# Patient Record
Sex: Male | Born: 1955 | Race: White | Hispanic: No | Marital: Married | Smoking: Former smoker
Health system: Southern US, Community
[De-identification: ages and names within clinical notes are randomized; demographics above are authoritative.]

## PROBLEM LIST (undated history)

## (undated) DIAGNOSIS — M7740 Metatarsalgia, unspecified foot: Secondary | ICD-10-CM

## (undated) DIAGNOSIS — T8859XA Other complications of anesthesia, initial encounter: Secondary | ICD-10-CM

## (undated) DIAGNOSIS — I739 Peripheral vascular disease, unspecified: Secondary | ICD-10-CM

## (undated) DIAGNOSIS — R350 Frequency of micturition: Secondary | ICD-10-CM

## (undated) DIAGNOSIS — G629 Polyneuropathy, unspecified: Secondary | ICD-10-CM

## (undated) DIAGNOSIS — I2510 Atherosclerotic heart disease of native coronary artery without angina pectoris: Secondary | ICD-10-CM

## (undated) DIAGNOSIS — J302 Other seasonal allergic rhinitis: Secondary | ICD-10-CM

## (undated) DIAGNOSIS — G4700 Insomnia, unspecified: Secondary | ICD-10-CM

## (undated) DIAGNOSIS — K219 Gastro-esophageal reflux disease without esophagitis: Secondary | ICD-10-CM

## (undated) DIAGNOSIS — Z973 Presence of spectacles and contact lenses: Secondary | ICD-10-CM

## (undated) DIAGNOSIS — Z8709 Personal history of other diseases of the respiratory system: Secondary | ICD-10-CM

## (undated) DIAGNOSIS — M1990 Unspecified osteoarthritis, unspecified site: Secondary | ICD-10-CM

## (undated) DIAGNOSIS — E785 Hyperlipidemia, unspecified: Secondary | ICD-10-CM

## (undated) DIAGNOSIS — I10 Essential (primary) hypertension: Secondary | ICD-10-CM

## (undated) DIAGNOSIS — N189 Chronic kidney disease, unspecified: Secondary | ICD-10-CM

## (undated) HISTORY — DX: Metatarsalgia, unspecified foot: M77.40

## (undated) HISTORY — PX: COLONOSCOPY: SHX174

## (undated) HISTORY — DX: Polyneuropathy, unspecified: G62.9

## (undated) HISTORY — DX: Peripheral vascular disease, unspecified: I73.9

## (undated) HISTORY — DX: Unspecified osteoarthritis, unspecified site: M19.90

## (undated) HISTORY — DX: Hyperlipidemia, unspecified: E78.5

## (undated) HISTORY — DX: Essential (primary) hypertension: I10

## (undated) HISTORY — PX: CERVICAL FUSION: SHX112

## (undated) HISTORY — DX: Atherosclerotic heart disease of native coronary artery without angina pectoris: I25.10

---

## 1992-05-30 HISTORY — PX: ELBOW ARTHROPLASTY: SHX928

## 1999-03-16 ENCOUNTER — Encounter: Admission: RE | Admit: 1999-03-16 | Discharge: 1999-06-14 | Payer: Self-pay | Admitting: Specialist

## 2001-12-20 ENCOUNTER — Encounter: Payer: Self-pay | Admitting: Occupational Medicine

## 2001-12-20 ENCOUNTER — Encounter: Admission: RE | Admit: 2001-12-20 | Discharge: 2001-12-20 | Payer: Self-pay | Admitting: Occupational Medicine

## 2002-01-15 ENCOUNTER — Encounter: Admission: RE | Admit: 2002-01-15 | Discharge: 2002-01-15 | Payer: Self-pay | Admitting: Family Medicine

## 2002-01-15 ENCOUNTER — Encounter: Payer: Self-pay | Admitting: Family Medicine

## 2002-05-30 HISTORY — PX: CERVICAL SPINE SURGERY: SHX589

## 2002-10-30 ENCOUNTER — Encounter: Payer: Self-pay | Admitting: Neurosurgery

## 2002-10-30 ENCOUNTER — Encounter: Admission: RE | Admit: 2002-10-30 | Discharge: 2002-10-30 | Payer: Self-pay | Admitting: Neurosurgery

## 2002-11-11 ENCOUNTER — Encounter: Payer: Self-pay | Admitting: Neurosurgery

## 2002-11-11 ENCOUNTER — Inpatient Hospital Stay (HOSPITAL_COMMUNITY): Admission: RE | Admit: 2002-11-11 | Discharge: 2002-11-12 | Payer: Self-pay | Admitting: Neurosurgery

## 2004-02-25 ENCOUNTER — Encounter: Admission: RE | Admit: 2004-02-25 | Discharge: 2004-02-25 | Payer: Self-pay | Admitting: Occupational Medicine

## 2004-05-30 DIAGNOSIS — I2510 Atherosclerotic heart disease of native coronary artery without angina pectoris: Secondary | ICD-10-CM

## 2004-05-30 HISTORY — PX: CORONARY ANGIOPLASTY: SHX604

## 2004-05-30 HISTORY — DX: Atherosclerotic heart disease of native coronary artery without angina pectoris: I25.10

## 2004-05-30 HISTORY — PX: OTHER SURGICAL HISTORY: SHX169

## 2004-10-28 ENCOUNTER — Ambulatory Visit (HOSPITAL_COMMUNITY): Admission: RE | Admit: 2004-10-28 | Discharge: 2004-10-29 | Payer: Self-pay | Admitting: *Deleted

## 2004-10-28 HISTORY — PX: CARDIAC CATHETERIZATION: SHX172

## 2007-05-31 HISTORY — PX: INCISION AND DRAINAGE ABSCESS POSTERIOR CERVICALSPINE: SHX1793

## 2007-07-05 ENCOUNTER — Observation Stay (HOSPITAL_COMMUNITY): Admission: AD | Admit: 2007-07-05 | Discharge: 2007-07-07 | Payer: Self-pay | Admitting: Neurosurgery

## 2008-02-19 ENCOUNTER — Ambulatory Visit (HOSPITAL_COMMUNITY): Admission: RE | Admit: 2008-02-19 | Discharge: 2008-02-20 | Payer: Self-pay | Admitting: Neurosurgery

## 2008-02-27 ENCOUNTER — Inpatient Hospital Stay (HOSPITAL_COMMUNITY): Admission: AD | Admit: 2008-02-27 | Discharge: 2008-02-28 | Payer: Self-pay | Admitting: Neurosurgery

## 2008-03-18 ENCOUNTER — Inpatient Hospital Stay (HOSPITAL_COMMUNITY): Admission: RE | Admit: 2008-03-18 | Discharge: 2008-03-21 | Payer: Self-pay | Admitting: Neurosurgery

## 2010-10-12 NOTE — Op Note (Signed)
NAME:  Derrick [name], Derrick [name] NO.:  [id number]   MEDICAL RECORD NO.:  [id number]          PATIENT TYPE:  INP   LOCATION:  2550                         FACILITY:  [facility]   PHYSICIAN:  Clydene Fake, M.D.  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   PROCEDURE:  Anterior cervical decompression, discectomy, and fusion at  C3-C4 and C4-C5, with Lifenet allograft bone, anterior cervical plate,  and removal of tether plate that was at C5 through C7, and placement of  the Jackson-Pratt drain.   SURGEON:  Clydene Fake, M.D.   ASSISTANT:  Hilda Lias, M.D.   ANESTHESIA:  General endotracheal tube anesthesia.   ESTIMATED BLOOD LOSS:  Minimal.   BLOOD GIVEN:  None.   COMPLICATIONS:  None.   REASON FOR PROCEDURE:  The patient is a 55 year old gentleman who had a  prior ACF at C5-C6 and C6-C7 years ago and has done well but started  getting neck pain, radiating to the shoulders, tingling in his hands,  and noticed some clumsiness and sloppiness. An MRI was done showing  spondylitic changes at C3-C4 and C4-C5 causing canal stenosis, most  significantly at C4-C5. Because of bilateral large disc herniations  along with the spondylitic change and some slight cord change seen in  the MRI, the patient was brought in for decompression and fusion.   PROCEDURE IN DETAIL:  The patient was brought to the operating room and  general anesthesia induced.  The patient was placed in 10 pounds halter  traction and prepped and draped in a sterile fashion.  The site of  incision was injected with 20 mL of lidocaine with epinephrine.  An  incision was  then made at the site of previous scar on the left side of  the neck transversely until the sternocleidomastoid muscle and then  obliquely going cephalad in a hockey stick shaped incision.  The  incision was taken down to the platysma and hemostasis obtained with  Bovie cauterization.  The platysma was incised and blunt dissection  taken through the anterior cervical fascia to anterior cervical spine.  We dissected above the omohyoid muscle and dissected down to the  anterior cervical spine.  I was able to find the top of the previous  plate and the disc spaces above it, C3-C4 and C4-C5. As we exposed  those, needles were placed in the two disc spaces and x-rays obtained  confirming our positioning. As the needles were removed, the C4-C5 space  was incised with a 15 blade and partial discectomy started with  pituitary rongeurs to mark that space.  The longus coli muscles were  reflected laterally on each side.  Attention was then taken to the  plate.  We could dissect down to the middle of the plate and then we  dissected  through the previous scar caudal to the omohyoid muscle and  exposed the bottom part of the plate.  I exposed the screw holes.  There  was actually some bone osteophytes going up around the plates and we  removed those with an osteotome and osteophyte tools.  We then removed  the screws and the superior screws were found to be fractured and the  distal piece was left in place.  After that, the plate was removed and a  good fusion of the previous areas fusion.  Attention was then taken back  to the C3-C4 and C4-C5 spaces.  A self-retaining retractor was placed so  we could see both spaces.  Distraction pins were placed in the C3 and C5  and the interspace was distracted.  Discectomy was then continued with  pituitary rongeurs and curettes and Kerrison punches to remove  osteophytes at both the C3-C4 and C4-C5 level.  Starting at C4-C5, with  microscopic dissection, 1 and 2  mm Kerrison punches were used to remove  posterior disc, posterior ligament, and posterior osteophytes.  Large  fragments of disc were herniated posterior and pushing the spinal cord  and these were decompressed with nerve hooks and Kerrison punches.  Bilateral foraminotomies were performed and when we were finished, we  had good central and lateral decompression and good decompression of the  nerve roots.  At C3-C4, discectomy was continued the same way removing  posterior osteophytes, disc, and posterior ligament, decompressing the  central canal and bilateral foramen.  A high speed drill was used to  remove cartilaginous endplates.  We then measured the disc spaces and  tapped in the corresponding Lifenet allograft bone.  A 4 mm bone was  placed into C3-C4, a 6 mm bone was placed in the C4-C5.  These were  countersunk 2 mm.  We checked posterior to the graft and there was  plenty of room between bone graft and dura.  We irrigated with  antibiotic solution.  The distraction and distraction pins were removed.  Hemostasis was obtained with Gelfoam and thrombin.  An anterior cervical  plate was placed over the anterior cervical spine and two screws placed  into C3, two into C4, and two into C5.  The screws were tightened.  Final x-rays were obtained showing good position of the plate, screws,  and bone plugs.  We irrigated with antibiotic solution, got good  hemostasis at the area of the new decompression, fusion and inferiorly  at the area where we removed the plate.  A JP drain was placed over the  anterior cervical spine and brought out through a separate stab wound  incision.  The platysma was closed with 3-0 Vicryl interrupted sutures,  the subcutaneous tissues closed with the same, the skin closed with  benzoin and Steri-Strips.  The drain was sutured in with 3-0 nylon.  A  dressing was placed.  The patient was placed in a soft cervical collar,  awakened from anesthesia, and  transferred to the recovery room in stable  condition.           ______________________________  Clydene Fake, M.D.     JRH/MEDQ  D:  07/06/2007  T:  07/06/2007  Job:  [id number]

## 2010-10-12 NOTE — Op Note (Signed)
NAME:  SILVIA, MARKUSON NO.:  [id number]   MEDICAL RECORD NO.:  [id number]          PATIENT TYPE:  OIB   LOCATION:  3029                         FACILITY:  [facility]   PHYSICIAN:  Clydene Fake, M.D.  DATE OF BIRTH:  [birth year]   DATE OF PROCEDURE:  03/18/2008  DATE OF DISCHARGE:                               OPERATIVE REPORT   PREOPERATIVE DIAGNOSIS:  Questionable wound infection, posterior  cervical wound incision.   POSTOPERATIVE DIAGNOSIS:  Questionable wound infection, posterior  cervical wound incision.   PROCEDURE:  I&D posterior cervical wound.   SURGEON:  Clydene Fake, MD   ANESTHESIA:  General endotracheal.   ESTIMATED BLOOD LOSS:  20 mL.   BLOOD GIVEN:  None.   DRAINS:  None.   COMPLICATIONS:  None.   PATHOLOGY:  Cultures were sent to labs.  Gram stains were negative.   REASON FOR PROCEDURE:  The patient is a 55 year old gentleman with  nonunion anterior cervical fusion, underwent posterior fusion, with  infuse BMP and allograft bone substitute.  He had copious drainage from  his wound, clear fluid for over 2-1/2 weeks, treating with  Cipro,  dressing changes, fluid has stopped, and he developed more erythema and  induration and some blisters are forming at the incision site.  White  counts remained normal throughout, but possibility of wound infection  was there, and he is having more pain.  Incision was on __________for I  and D of his wound.   PROCEDURE IN DETAIL:  The patient was brought to the operating room and  general anesthesia was induced.  The patient was placed in the prone  position and all pressure points were padded.  The patient was prepped  and draped in sterile fashion and standard previous incision was  incised.  We got a clear yellowish serum-like fluid that was draining  and cultures were sent for superficial area, incision taken down towards  the fascia and the inferior aspect was a defect through the fascia and  through the spinous process at probably C7, and some fluid is coming up  the fascia and did not look purulent, hence the fascia was opened  yesterday and some more serum-like fluid was obtained __________blood.  Deep cultures were then done also.  We debrided the abnormal skin edges  or getting retrograde bleeding normal tissue.  We then Pulsavac'd the  superficial edges along with irrigating the deep root plane.  The  allograft bone substitute to a fragment in copious amount and this was  found in the subcutaneous tissue and this was debrided also.  Initially,  the debridement reviewed good and normal looking tissue.  Fashion was  reclosed with  0 Vicryl interrupted sutures.  Skin closed with 3-0 nylon  and interrupted sutures.  A dressing was placed.  The patient was then  placed back in the supine position, removed from the head pins and  awoken from anesthesia, and transferred to recovery room in stable  condition.           ______________________________  Clydene Fake, M.D.     JRH/MEDQ  D:  03/18/2008  T:  03/19/2008  Job:  [id number]

## 2010-10-12 NOTE — Op Note (Signed)
NAME:  YANIEL, LIMBAUGH NO.:  [id number]   MEDICAL RECORD NO.:  [id number]          PATIENT TYPE:  OIB   LOCATION:  3534                         FACILITY:  [facility]   PHYSICIAN:  Clydene Fake, M.D.  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   POSTOPERATIVE DIAGNOSIS:  Nonunion C3-4 and C4-5 with spondylosis,  cervical spine.   PROCEDURE:  Posterior fusion C3 through C6 (3 levels) with lateral mass  screws and rods from [employer], segmented, autograft same incision,  allograft with Infuse bone morphogenetic protein.   SURGEON:  Clydene Fake, MD   ASSISTANT:  Stefani Dama, MD   ANESTHESIA:  General endotracheal tube anesthesia.   ESTIMATED BLOOD LOSS:  Minimal.   BLOOD GIVEN:  None.   DRAINS:  None.   COMPLICATIONS:  None.   REASON FOR PROCEDURE:  The patient is a 55 year old gentleman with  __________ and has developed a nonunion with fracture - screws, there is  also delayed healing at C3-4.  The patient was brought in for posterior  fusion.   PROCEDURE IN DETAIL:  The patient was brought into the operating room  and general anesthesia was induced.  The patient was placed in [place]  head pins and placed in a prone position with all pressure points  padded.  The patient was prepped and draped in sterile fashion.  A  standard incision was injected with 20 mL of 1% lidocaine with  epinephrine.  Needle was placed in posterior neck.  Fluoroscopy was used  showing we were pointing at the four spinous processes.  Incision was  then made in the midline of cervical spine extending down to fascia.  Hemostasis was obtained with Bovie cauterization.  The fascia was  incised and subperiosteal dissection was done over the C3, C4, C5, and  C6 spinous processes and lamina out to the facets and out over  the  lateral masses.  Self-retaining retractor was placed.  We used high-  speed drill to decorticate facets at C3-4, C4-5, and C5-6, and  decorticate the cortex over the lateral mass and lamina.  We did use  intraoperative fluoroscopy to confirm our positioning.  We then used an  awl to find our entry sites for lateral mass screws on the right side of  C3, C4, C5, and C6.  We then drilled the hole and drilled 40-mm holes at  each of these levels of the angulation up and out through our entry  point, which was 1 mm medial and inferior to the midline of the lateral  mass.  We tapped the holes and then placed 14-mm lateral mass screws.  This process was then repeated on the left side.  We cut rods to size  and bent of a shape, and placed rods into the screw heads, placed  locking nuts, and then we final tightened the nuts on each side, Infuse  it, the autograft bone that we removed from our  drilling, and the  spinous process tips were then placed in the posterolateral space for  posterior fusion.  We placed Infuse BMP and then another allograft  product.  We placed in the posterior space 4 __________ Synthes for  posterior fusion C3 through C6.  The retractors were removed.  We had  good hemostasis.  The fascia was closed with 0 Vicryl interrupted  sutures.  Subcutaneous tissue closed with 0, 2-0, and 3-0 Vicryl  interrupted sutures.  Skin was closed with benzoin and Steri-Strips.  A  dressing was placed.  The patient was placed back into supine position,  removed from head pins, had an Aspen cervical collar placed, awoken from  anesthesia, and transferred to recovery room in stable condition.           ______________________________  Clydene Fake, M.D.     JRH/MEDQ  D:  02/19/2008  T:  02/20/2008  Job:  [id number]

## 2010-10-15 NOTE — Op Note (Signed)
NAME:  Derrick [name], Derrick [name]                        ACCOUNT NO.:  [id number]   MEDICAL RECORD NO.:  [id number]                   PATIENT TYPE:  INP   LOCATION:  NA                                   FACILITY:  [facility]   PHYSICIAN:  Clydene Fake, M.D.               DATE OF BIRTH:  1955-08-19   DATE OF PROCEDURE:  11/11/2002  DATE OF DISCHARGE:                                 OPERATIVE REPORT   PREOPERATIVE DIAGNOSIS:  Spondylosis and herniated nucleus pulposis of C5-6  and C6-7 and left C7 radiculopathy.   POSTOPERATIVE DIAGNOSIS:  Spondylosis and herniated nucleus pulposis of C5-6  and C6-7 and left C7 radiculopathy.   PROCEDURES:  Anterior cervical decompression, diskectomy, and fusion of C5-6  and C6-7 with __________ allograft to bone and anterior cervical plate.   SURGEON:  Clydene Fake, M.D.   ASSISTANT:  Cristi Loron, M.D.   DRAINS:  None.   COMPLICATIONS:  None.   REASON FOR PROCEDURE:  The patient is a 55 year old gentleman who has had  neck and left arm pain and numbness for a period of months.  He was found to  have some left triceps muscle atrophy and he does have decreased sensation  at left C6-7 nerve roots.  MRI was done showing severe spondylosis at C5-6  and C6-7 and bi foraminal narrowing, worse on the left at both levels.  The  patient was brought in for decompression and fusion.   PROCEDURE IN DETAIL:  The patient was brought into the operating room and  general anesthesia was induced.  The patient was placed in 10 pounds of  halter traction and prepped and draped in a sterile fashion.  A second  incision was injected with 10 cc 4% lidocaine with epinephrine.  An incision  was made from the midline to the anterior border of the sternocleidomastoid  muscle on the left side of the neck.  The incision was taken down to the  platysma.  Hemostasis was obtained with electrocauterization.  The platysma  was incised with the Bovie and blunt dissection was  taken through the  anterior cervical fascia to the anterior cervical spine.  A needle was  placed in the interspace.  X-rays were obtained showing this was the C5-6  interspace.  Partial diskectomy was performed as the needle was removed with  pituitary rongeurs.  The longus colli muscles were reflected laterally on  each side between 5, 6, and 7 and a self-retaining retractor was placed.  Distraction pins were placed at C5 and C7 and disk spaces at both C5-6 and  C6-7 were incised with a 15 blade and partial diskectomy was performed and  then interspaces were distracted.  The microscope was brought in for  microdissection using curets, pituitary rongeurs, and Kerns clamps.  After  the disk was removed, posterior longitudinal ligament and disk herniation  and posterior osteophytes were also removed.  __________ in the central  canal and bilateral foraminotomies were performed.  A high-speed drill was  used to remove some of the bone throughout the uncovertebral joints and  removed the cartilaginous endplates.  There was significant foraminal  stenosis bilaterally and we extensively decompressed the roots on both sides  at both C5-6 and C6-7, especially on the left side to decompress these  roots.  When we were finished, we had good decompression of the central  canal and bilateral foramen at both C5-6 and C6-7.  Hemostasis was obtained  with Gelfoam and thrombin.  This was then irrigated out.  We measured the  height of the interbody space using the life__________ bone templates, noted  to be 7 mm, and two 7 mm lengths of bone were tapped into place and  countersunk about a millimeter.  There was plenty of room between the bone  and dura when checked with the nerve hook.  The wound was irrigated with  antibiotic solution.  Distraction was removed.  Weight was removed from  traction.  Distraction pins were removed.  Hemostasis was obtained with  Gelfoam and thrombin.  A __________ anterior  cervical plate was placed over  the anterior cervical spine.  Two screws were placed in C5, one in C6, and  two in C7.  They were tightened.  The retractor was removed.  Hemostasis was  obtained with Gelfoam and thrombin.  Bipolar cauterization.  X-rays were  obtained showing good position of the plate screws interbody thick bone from  C5-6 and C6-7.  The wound was irrigated with antibiotic solution and we were  satisfied with the hemostasis.  The platysma was closed with 3-0 Vicryl  interrupted suture.  The subcutaneous tissue was closed with the same and  the skin was closed with DermaBond skin glue.  When it was dry, a dressing  was placed.  The patient was placed in a soft cervical collar, woke up from  anesthesia, and sent to the recovery room in stable condition.                                               Clydene Fake, M.D.    JRH/MEDQ  D:  11/11/2002  T:  11/11/2002  Job:  [id number]

## 2010-10-15 NOTE — Cardiovascular Report (Signed)
NAME:  JEN, EPPINGER NO.:  [id number]   MEDICAL RECORD NO.:  [id number]          PATIENT TYPE:  OIB   LOCATION:  6529                         FACILITY:  [facility]   PHYSICIAN:  Peter M. Swaziland, M.D.  DATE OF BIRTH:  [birth year]   DATE OF PROCEDURE:  10/28/2004  DATE OF DISCHARGE:                              CARDIAC CATHETERIZATION   INDICATIONS:  A 55 year old white male who presented with chest pain. He had  a stress Cardiolite study which was markedly abnormal showing significant  multivessel ischemia. Subsequent cardiac catheterization demonstrated three-  vessel obstructive coronary disease. He had occlusion of the right coronary  which was supplied by collateral flow. He had a focal 90% stenosis in the  midLAD as well as a focal 95% stenosis in the midcircumflex coronary. It was  felt the patient was a candidate for two-vessel stenting. The patient had a  6-French arterial sheath in place from his diagnostic cardiac  catheterization.   EQUIPMENT USED:  A 6-French 3.5 left Voda, a 0.014 high-torque floppy extra  support wire, a 3.0 x 12 mm Maverick balloon, a 3.0 x 13 mm Cypher stent for  the midcircumflex, a 3.0 x 18 mm Cypher stent for the midLAD and a 3.0 x 13  mm PowerSail balloon.   TOTAL CONTRAST:  Total contrast of the diagnostic and interventional  procedure was 305 cc of Omnipaque. The patient remained hemodynamically  stable throughout its intervention. During balloon inflations in the midLAD,  he did have ST elevation in the precordial leads. He had minimal chest pain.  There were no complications.   PROCEDURE NOTE:  We initially address the lesion in the midcircumflex. This  lesion was crossed without difficulty and predilated using a 3.0 x 12 mm  Maverick balloon to 6 atmospheres. We then placed a 3.0 x 13 mm Cypher drug-  eluting stent across lesion and this was deployed at 11 atmospheres and  postdilated with the stent balloon to 14  atmospheres. This yielded an  excellent angiographic result was 0% residual stenosis.   We then addressed the lesion in the midLAD. This was more of a segmental  stenosis. It also was predilated using a 3.0 x 12 mm Maverick balloon to 6  atmospheres. We then stented this lesion using a 3.0 x 18 mm Cypher stent  deploying at 11 atmospheres and postdilated with the stent balloon to 12  atmospheres. There was still some irregularity in the mid stent, however,  and so we used a 3.0 x 13 millimeter PowerSail balloon to post dilate up to  16 atmospheres. This yielded an excellent angiographic result with 0%  residual stenosis and TIMI grade III flow.   Medications during the procedure included 25 mcg of fentanyl, 1 milligram of  Versed, Integrilin double bolus at 180 mcg/kg followed by continuous  infusion of 2 mcg/kg per minute, nitroglycerin 200 mcg intracoronary x2,  heparin 5000 units IV with subsequent ACT of 256.   FINAL ASSESSMENT:  Successful two-vessel intracoronary stenting of the  midleft anterior descending artery and the midleft circumflex coronary.      PMJ/MEDQ  D:  10/28/2004  T:  10/28/2004  Job:  [id number]   cc:   Caryn Bee L. Little, M.D.  [address]  [place]  [place] [address]  Fax: [phone]   Elmore Guise., M.D.  [address]  [place], [place] [address]  Fax: [phone]

## 2010-10-15 NOTE — Discharge Summary (Signed)
NAME:  Derrick [name], KELNHOFER NO.:  [id number]   MEDICAL RECORD NO.:  [id number]          PATIENT TYPE:  OIB   LOCATION:  6529                         FACILITY:  [facility]   PHYSICIAN:  Elmore Guise., M.D.DATE OF BIRTH:  [birth year]   DATE OF ADMISSION:  10/28/2004  DATE OF DISCHARGE:  10/29/2004                                 DISCHARGE SUMMARY   DISCHARGE DIAGNOSES:  1.  Multivessel coronary artery disease.  2.  Status post successful stent placement in the circumflex and left      anterior descending arteries.  3.  Dyslipidemia.   HISTORY OF PRESENT ILLNESS:  The patient is a 55 year old white male  admitted for elective cardiac catheterization after abnormal stress test.  The patient had cardiac catheterization with stent placement on 10/28/2004.  He had a 90-95% mid circumflex lesion which was focal in nature as well as a  90% mid-LAD lesion which was focal in nature. His RCA was totally occluded  filling via left to right and right to right collaterals. Because of the  focal nature of his lesions, he underwent stent placement which was  successful. Postprocedure, he has done well. He did have an abnormal chest x-  ray done at his primary physician's office. Because of this, he underwent CT  scan on 10/29/2004 to evaluate for any pulmonary nodules and this was  negative. He will be discharged home today to continue the following  medications.   DISCHARGE MEDICATIONS:  1.  Plavix 75 milligrams daily.  2.  Aspirin 325 milligrams daily.  3.  Zetia 10 milligrams daily.  4.  Tricor 145 milligrams daily.  5.  Crestor 20 milligrams daily.  6.  Fish oil.  7.  Calcitriol 0.25 milligrams daily.  8.  Nitroglycerin p.r.n.  9.  Ibuprofen 200 milligrams 2-3 pills every 8 hours as needed for pain.      Pain management is with ibuprofen.   ACTIVITY:  The patient was advised to do no strenuous activity or heavy  lifting for the next 48 hours, then he can resume light  activity. The  patient may return to normal work activities on 11/05/2004.   DISCHARGE DIET:  Diet is low cholesterol, low salt.   WOUND CARE:  Routine post cath/intervention instructions/restrictions.   FOLLOWUP:  Follow up with Dr. Reyes Ivan at [facility] next  Wednesday, 11/03/2004 or Thursday 11/04/2004. The patient is to call the  office at [phone] for an appointment.      TWK/MEDQ  D:  10/29/2004  T:  10/29/2004  Job:  [id number]   cc:   Caryn Bee L. Little, M.D.  [address]  [place]  [place] [address]  Fax: [phone]

## 2010-10-15 NOTE — H&P (Signed)
NAME:  Derrick [name], Derrick [name] NO.:  [id number]   MEDICAL RECORD NO.:  [id number]          PATIENT TYPE:  OIB   LOCATION:                               FACILITY:  [facility]   PHYSICIAN:  Elmore Guise., M.D.DATE OF BIRTH:  [birth year]   DATE OF ADMISSION:  10/28/2004  DATE OF DISCHARGE:                                HISTORY & PHYSICAL   INDICATION FOR ADMISSION:  Abnormal stress test and increasing exertional  chest pain.   HISTORY OF PRESENT ILLNESS:  The patient is a very pleasant 55 year old  white male with past medical history of dyslipidemia, remote history of  tobacco use and family history of coronary disease, who presents for  evaluation of exertional chest pain.  The patient was initially seen on Oct 19, 2004, when he reported that he was having difficulty with chest  tightness when mowing his yard.  He said that tightness would come and go,  it was worse when he was pushing his mower up a hill; however, it did not  seem to bother him when he did other strenuous activities.  He would stop  mowing, go and rest.  His symptoms totally resolved.  However, the last week he could walk up a small hill and sometimes she would  have chest pressure.  Today he returned for exercise Cardiolite and exercised 5 minutes 33 seconds  with 3-4 mm ST depression in his inferior lateral leads.  The patient had  5/10 chest tightness at that time.  The procedure was stopped, his chest  tightness resolved.  EKG changes improved towards baseline.  Nuclear stress  images showed decreased uptake in the inferior, distal and midanterior walls  as well as the anterior septal areas.  He had good uptake in the lateral  wall.  EF was approximately 50%, however mild to moderately dilated.  No  significant wall motion abnormalities were noted.   REVIEW OF SYSTEMS:  Otherwise negative.  He is having no unstable symptoms  at this time.  No rest pain.   His current medications are:  1.   Calcitriol 0.25 mg daily.  2.  Tricor 145 mg daily.  3.  Zetia 10 mg daily.  4.  Fish oil once daily.  5.  Crestor 10 mg daily.  6.  Aspirin 325 mg daily.  7.  Nitroglycerin p.r.n.   ALLERGIES:  To CODEINE.   FAMILY HISTORY:  Positive for coronary disease in his uncles start in their  early 61s.  His father died of lung cancer at age 9.  Mother has Raynaud's  disease.  He has two older brothers, ages 91 and 44, are both healthy.   SOCIAL HISTORY:  He is divorced.  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  He does not exercise, but he  does stay very active with work and work around [employer].  He had a  history of tobacco use; however, he quit 15 years ago and has an occasional  beer.   PHYSICAL EXAMINATION:  VITAL SIGNS:  Weight is 219 pounds, blood pressure is  140/90, heart rate is 60 and regular.  GENERAL:  He is very pleasant middle-aged white male, alert and oriented x4,  in no acute distress.  HEENT:  HEENT Appeared normal.  NECK:  Supple.  No lymphadenopathy, 2+ carotids, no JVD and no bruits.  CHEST:  Lungs were clear.  HEART:  Regular with a normal S1. S2.  No significant murmurs, gallops or  rubs.  ABDOMEN:  Soft, nontender, nondistended.  EXTREMITIES:  Warm with 2+ pulses and no edema.  Femorals are 2+ and equal  bilaterally.  NEUROLOGIC:  He is intact with no focal deficits.   Blood work is pending at time of dictation.  His resting EKG shows normal  sinus rhythm, rate of 60 per minute, normal axis, normal intervals, with RSR  prime noted in V2 and no significant ST or T-wave abnormalities.   IMPRESSION:  1.  Abnormal stress Cardiolite.  2.  Dyslipidemia.  3.  Hypertension.   PLAN:  From a cardiovascular standpoint, I have asked him to take aspirin  and to not perform any strenuous activities until he has invasive workup of  coronary disease.  We will continue his medications as listed above.  We  discussed  cardiac catheterization and possible intervention with him at  length.  This will be scheduled for October 28, 2004, or sooner should he become  more symptomatic.  Routine blood work will be done today including BMP, CBC  and coagulation panel.  Chest x-ray today showed no acute cardiopulmonary  disease.      TWK/MEDQ  D:  10/26/2004  T:  10/26/2004  Job:  [id number]   cc:   Caryn Bee L. Little, M.D.  [address]  [place]  [place] [address]  Fax: [phone]

## 2010-10-15 NOTE — Discharge Summary (Signed)
Derrick [name], Derrick [name] NO.:  [id number]   MEDICAL RECORD NO.:  [id number]          PATIENT TYPE:  INP   LOCATION:  3029                         FACILITY:  [facility]   PHYSICIAN:  Clydene Fake, M.D.  DATE OF BIRTH:  Aug 14, 1955   DATE OF ADMISSION:  03/18/2008  DATE OF DISCHARGE:  03/21/2008                               DISCHARGE SUMMARY   DIAGNOSES:  1. Posterior cervical wound drainage.  2. Questionable wound infection.   DISCHARGE DIAGNOSIS:  Wound drainage, no sign of infection.   PROCEDURE:  Incision and drainage, posterior cervical wound.   REASON FOR ADMISSION:  The patient underwent posterior cervical fusion  few weeks prior to wound drainage that has continued to worsen.  The  patient is brought in for I&D of wound.   HOSPITAL COURSE:  The patient underwent I&D of wound.  No purulent  material was seen.  Candida cultures were sent, Gram stain negative.  Postop, the patient is doing well.  He is on IV vancomycin and p.o.  Cipro.  Cultures have remained negative.  Incision looked good and  healing well.  Culture were negative, 2 days.  On March 21, 2008,  discharged home in stable condition.  Continue p.o. Cipro.  Rest of the  meds same as prehospitalization including Vicodin ES, Effexor, and  Voltaren p.r.n. for his operative pain.  Activity, no strenuous activity  and follow up will be in 10 days for suture removal and wound check.           ______________________________  Clydene Fake, M.D.     JRH/MEDQ  D:  05/01/2008  T:  05/01/2008  Job:  [id number]

## 2010-10-15 NOTE — Cardiovascular Report (Signed)
NAME:  Derrick [name], Derrick [name] NO.:  [id number]   MEDICAL RECORD NO.:  [id number]          PATIENT TYPE:  OIB   LOCATION:  2899                         FACILITY:  [facility]   PHYSICIAN:  Elmore Guise., M.D.DATE OF BIRTH:  [birth year]   DATE OF PROCEDURE:  10/28/2004  DATE OF DISCHARGE:                              CARDIAC CATHETERIZATION   PROCEDURE:  Cardiac catheterization.   INDICATIONS FOR PROCEDURE:  Exertional chest pain and abnormal stress test.   DESCRIPTION OF PROCEDURE:  The patient was brought to the cardiac cath lab  after the appropriate informed consent.  He was prepped and draped in a  sterile fashion.  Approximately 10 cc of 1% lidocaine was used for local  anesthesia.  A 6-French sheath was placed in the right femoral artery  without difficulty.  Coronary angiography, LV angiography, right femoral  angiography and left subclavian and LIMA angiography were then performed  without difficulty.   FINDINGS:  1.  Left main:  Normal size with a distal 20% stenosis.  2.  LAD:  Has proximal 30-40% irregularities, with a focal mid 80-90%      stenosis and distal luminal irregularities.  3.  First diagonal:  Small vessel mild luminal irregularities.  4.  Second diagonal:  Small vessel mild luminal irregularities.  5.  LCX:  Nondominant, large sized vessel with a focal mid 90-95% stenosis      with proximal and distal mild luminal irregularities.  6.  The left system does fill the distal RCA via left-to-right collaterals,      backfilling past the PDA and PLD branches.  7.  RCA:  Proximal 90% stenosis, followed by a 10% long mid occlusion, the      distal vessel fills via right-to-left collaterals.  8.  LVEF is 50-55% with mild inferior apical hypokinesis, EDP is 22 mmHg.   Left subclavian artery: No significant disease with patent and ungrafted  LIMA  Limited right femoral artery:  Mild  atherosclerosis, no dissection and  appropriate arteriotomy site   IMPRESSION:  1.  Obstructive three-vessel coronary artery disease as described with focal      lesions noted in the circumflex and left anterior descending arteries.  2.  Preserved left ventricular systolic function with an ejection fraction      of 50-55% with mild inferior apical hypokinesis and an LVEDP measured at      22 mmHg.   PLAN:  Due to the focal areas of stenosis, we will discuss with Dr. [place]and  elective PCI of the patient's circumflex and LAD lesions with aggressive  medical management and risk factor modification to help prevent further  disease.  Because of the small size of his RCA, surgical grafting may not be  feasible, therefore, since he is an interventional candidate we will try  interventional options first.      TWK/MEDQ  D:  10/28/2004  T:  10/28/2004  Job:  [id number]   cc:   Caryn Bee L. Little, M.D.  [address]  [place]  [place] [address]  Fax: [phone]

## 2010-10-18 ENCOUNTER — Encounter: Payer: Self-pay | Admitting: Family Medicine

## 2010-10-29 ENCOUNTER — Encounter: Payer: Self-pay | Admitting: Cardiovascular Disease

## 2010-10-29 ENCOUNTER — Ambulatory Visit (INDEPENDENT_AMBULATORY_CARE_PROVIDER_SITE_OTHER): Payer: BC Managed Care – PPO | Admitting: Cardiovascular Disease

## 2010-10-29 VITALS — BP 124/64 | HR 64 | Ht 74.0 in | Wt 225.0 lb

## 2010-10-29 DIAGNOSIS — E785 Hyperlipidemia, unspecified: Secondary | ICD-10-CM

## 2010-10-29 DIAGNOSIS — I2510 Atherosclerotic heart disease of native coronary artery without angina pectoris: Secondary | ICD-10-CM

## 2010-10-29 DIAGNOSIS — I25110 Atherosclerotic heart disease of native coronary artery with unstable angina pectoris: Secondary | ICD-10-CM | POA: Insufficient documentation

## 2010-10-29 MED ORDER — NITROGLYCERIN 0.4 MG SL SUBL: 0.4000 mg | SUBLINGUAL_TABLET | SUBLINGUAL | Status: DC | PRN

## 2010-10-29 NOTE — Progress Notes (Signed)
Christoper [name] Date of Birth  12/30/1955 [facility] [address]     [address] [place], [place]  [address] [phone]  Fax  [phone]  History of Present Illness:  55 yo male with history of CAD - s/p stents to LAD and LCx.  Chronically occluded RCA.  No angina.  He eats a very fatty diet - lots of fried foods.  Not exercising.  He does lots of yard work and has never had any episodes of angina.  Current Outpatient Prescriptions  Medication Sig Dispense Refill  . aspirin 325 MG tablet Take 325 mg by mouth daily.        . calcium carbonate (OS-CAL) 600 MG TABS Take 600 mg by mouth 2 (two) times daily with a meal. 3 TABLETS BID       . Cholecalciferol (VITAMIN D) 2000 UNITS CAPS Take 2,400 Units by mouth daily.        Marland Kitchen doxercalciferol (HECTOROL) 2.5 MCG capsule Take 2.5 mcg by mouth daily.       Marland Kitchen ezetimibe (ZETIA) 10 MG tablet Take 10 mg by mouth daily.        . fenofibrate (TRICOR) 145 MG tablet Take 145 mg by mouth daily.        . fish oil-omega-3 fatty acids 1000 MG capsule Take 4,800 mg by mouth daily.       . meloxicam (MOBIC) 15 MG tablet Take 15 mg by mouth daily.        Marland Kitchen olmesartan (BENICAR) 20 MG tablet Take 20 mg by mouth daily.           Allergies  Allergen Reactions  . Codeine     Past Medical History  Diagnosis Date  . Hyperparathyroidism   . Hypertension   . Dyslipidemia   . OA (osteoarthritis)   . Metatarsalgia   . Coronary artery disease   . Hyperlipidemia     Past Surgical History  Procedure Date  . Cervical spine surgery   . Cardiac catheterization 10/28/2004    circ and mid LAD chyper stents    History  Smoking status  . Former Smoker  . Types: Cigarettes  . Quit date: 05/30/1990  Smokeless tobacco  . Not on file    History  Alcohol Use  . Yes    one drink a month    Family History  Problem Relation Age of Onset  . Arthritis Mother   . Lung cancer Father     Reviw of Systems:    Reviewed in the HPI.  All other systems are negative.  Physical Exam: BP 124/64  Pulse 64  Ht 6\' 2"  (1.88 m)  Wt 225 lb (102.059 kg)  BMI 28.89 kg/m2 The patient is alert and oriented x 3.  The mood and affect are normal.  The skin is warm and dry.  Color is normal.  The HEENT exam reveals that the sclera are nonicteric.  The mucous membranes are moist.  The carotids are 2+ without bruits.  There is no thyromegaly.  There is no JVD.  The lungs are clear.  The chest wall is non tender.  The heart exam reveals a regular rate with a normal S1 and S2.  There are no murmurs, gallops, or rubs.  The PMI is not displaced.   Abdominal exam reveals good bowel sounds.  There is no guarding or rebound.  There is no hepatosplenomegaly or tenderness.  There are no masses.  Exam of  the legs reveal no clubbing, cyanosis, or edema.  The legs are without rashes.  The distal pulses are intact.  Cranial nerves II - XII are intact.  Motor and sensory functions are intact.  The gait is normal.  ECG: Sinus bradycardia. There are no ST or T wave changes. Assessment / Plan:

## 2010-10-29 NOTE — Patient Instructions (Signed)
Eat a low fat diet, exercise every day.

## 2010-10-29 NOTE — Assessment & Plan Note (Signed)
Derrick [name] is doing fairly well. I'm glad that he's not had any episodes of chest pain. He is not able to take statins.  I  have encouraged him to eat a low fat and low cholesterol diet.

## 2010-10-29 NOTE — Assessment & Plan Note (Signed)
He will continue the Zetia and TriCor. He is not able to take statins. His triglyceride levels remain elevated. I've encouraged him to cut out his high-fat foods.

## 2011-02-18 LAB — CBC
HCT: 39.5
Hemoglobin: 13.5
MCHC: 34.2
MCV: 88.2
Platelets: 298
RBC: 4.48
RDW: 13
WBC: 7.4

## 2011-02-18 LAB — URINALYSIS, ROUTINE W REFLEX MICROSCOPIC
Bilirubin Urine: NEGATIVE
Glucose, UA: NEGATIVE
Ketones, ur: NEGATIVE
Leukocytes, UA: NEGATIVE
Nitrite: NEGATIVE
Protein, ur: NEGATIVE
Specific Gravity, Urine: 1.015
Urobilinogen, UA: 0.2
pH: 5.5

## 2011-02-18 LAB — URINE MICROSCOPIC-ADD ON

## 2011-02-18 LAB — COMPREHENSIVE METABOLIC PANEL
ALT: 26
AST: 27
Albumin: 4.4
Alkaline Phosphatase: 35 — ABNORMAL LOW
BUN: 12
CO2: 28
Calcium: 8.7
Chloride: 102
Creatinine, Ser: 0.97
GFR calc Af Amer: >60
GFR calc non Af Amer: >60
Glucose, Bld: 95
Potassium: 4.3
Sodium: 140
Total Bilirubin: 0.8
Total Protein: 7.6

## 2011-02-18 LAB — PROTIME-INR
INR: 0.9
Prothrombin Time: 12.5

## 2011-02-18 LAB — APTT: aPTT: 28

## 2011-02-28 LAB — URINALYSIS, ROUTINE W REFLEX MICROSCOPIC
Bilirubin Urine: NEGATIVE
Bilirubin Urine: NEGATIVE
Glucose, UA: NEGATIVE
Glucose, UA: NEGATIVE
Ketones, ur: NEGATIVE
Ketones, ur: NEGATIVE
Leukocytes, UA: NEGATIVE
Leukocytes, UA: NEGATIVE
Nitrite: NEGATIVE
Nitrite: NEGATIVE
Protein, ur: NEGATIVE
Protein, ur: NEGATIVE
Specific Gravity, Urine: 1.016
Specific Gravity, Urine: 1.014
Urobilinogen, UA: 0.2
Urobilinogen, UA: 0.2
pH: 7
pH: 7

## 2011-02-28 LAB — CBC
HCT: 37 — ABNORMAL LOW
HCT: 40.2
HCT: 38.5 — ABNORMAL LOW
Hemoglobin: 12.4 — ABNORMAL LOW
Hemoglobin: 13.7
Hemoglobin: 12.6 — ABNORMAL LOW
MCHC: 33.4
MCHC: 34
MCHC: 32.8
MCV: 88.3
MCV: 88.5
MCV: 88.3
Platelets: 273
Platelets: 357
Platelets: 386
RBC: 4.18 — ABNORMAL LOW
RBC: 4.55
RBC: 4.36
RDW: 12.9
RDW: 13.3
RDW: 13.3
WBC: 7.7
WBC: 8.8
WBC: 7.5

## 2011-02-28 LAB — DIFFERENTIAL
Basophils Absolute: 0
Basophils Absolute: 0
Basophils Relative: 0
Basophils Relative: 1
Eosinophils Absolute: 0.2
Eosinophils Absolute: 0.1
Eosinophils Relative: 2
Eosinophils Relative: 1
Lymphocytes Relative: 16
Lymphocytes Relative: 19
Lymphs Abs: 1.4
Lymphs Abs: 1.4
Monocytes Absolute: 1.3 — ABNORMAL HIGH
Monocytes Absolute: 0.7
Monocytes Relative: 14 — ABNORMAL HIGH
Monocytes Relative: 9
Neutro Abs: 5.9
Neutro Abs: 5.4
Neutrophils Relative %: 67
Neutrophils Relative %: 71

## 2011-02-28 LAB — CULTURE, BLOOD (ROUTINE X 2)
Culture: NO GROWTH
Culture: NO GROWTH

## 2011-02-28 LAB — URINE MICROSCOPIC-ADD ON

## 2011-02-28 LAB — HEPATIC FUNCTION PANEL
ALT: 22
AST: 27
Albumin: 4.3
Alkaline Phosphatase: 32 — ABNORMAL LOW
Bilirubin, Direct: 0.2
Indirect Bilirubin: 0.5
Total Bilirubin: 0.7
Total Protein: 7.2

## 2011-02-28 LAB — PROTIME-INR
INR: 1
INR: 1
INR: 1
Prothrombin Time: 13
Prothrombin Time: 13.2
Prothrombin Time: 13

## 2011-02-28 LAB — GRAM STAIN

## 2011-02-28 LAB — ANAEROBIC CULTURE

## 2011-02-28 LAB — BASIC METABOLIC PANEL
BUN: 15
BUN: 10
CO2: 25
CO2: 25
Calcium: 8.8
Calcium: 9.1
Chloride: 106
Chloride: 102
Creatinine, Ser: 1
Creatinine, Ser: 1.03
GFR calc Af Amer: >60
GFR calc Af Amer: >60
GFR calc non Af Amer: >60
GFR calc non Af Amer: >60
Glucose, Bld: 90
Glucose, Bld: 90
Potassium: 4.2
Potassium: 3.7
Sodium: 139
Sodium: 138

## 2011-02-28 LAB — WOUND CULTURE
Culture: NO GROWTH
Culture: NO GROWTH

## 2011-02-28 LAB — APTT
aPTT: 28
aPTT: 38 — ABNORMAL HIGH
aPTT: 29

## 2011-02-28 LAB — C-REACTIVE PROTEIN: CRP: 6.6 — ABNORMAL HIGH (ref <0.6)

## 2011-02-28 LAB — SEDIMENTATION RATE: Sed Rate: 88 — ABNORMAL HIGH

## 2013-03-04 ENCOUNTER — Other Ambulatory Visit: Payer: Self-pay | Admitting: Orthopedic Surgery

## 2013-03-04 DIAGNOSIS — M25511 Pain in right shoulder: Secondary | ICD-10-CM

## 2013-03-11 ENCOUNTER — Ambulatory Visit
Admission: RE | Admit: 2013-03-11 | Discharge: 2013-03-11 | Disposition: A | Discharge status: Home / Self Care | Payer: BC Managed Care – PPO | Source: Ambulatory Visit | Attending: Orthopedic Surgery | Admitting: Orthopedic Surgery

## 2013-03-11 DIAGNOSIS — M25511 Pain in right shoulder: Secondary | ICD-10-CM

## 2013-03-19 ENCOUNTER — Other Ambulatory Visit: Payer: Self-pay | Admitting: Orthopedic Surgery

## 2013-03-22 ENCOUNTER — Encounter (HOSPITAL_BASED_OUTPATIENT_CLINIC_OR_DEPARTMENT_OTHER): Payer: Self-pay | Admitting: *Deleted

## 2013-03-22 NOTE — Progress Notes (Signed)
Reviewed with dr crews-does need to see cardiology prior to surgery-[facility] called-has ov with Lawson Fiscal 03/27/13 at 8am-arrive 745am=-pt called

## 2013-03-22 NOTE — Progress Notes (Signed)
Pt had stents x2 2006-followed dr Melburn Popper until 6/12-has not seen cardiology since-no recent ekg-has occ palpitations-needs ekg and bmet-and will ck with anesthesia-may need to see cardiology. Bring all meds and overnight bag dos

## 2013-03-26 ENCOUNTER — Other Ambulatory Visit: Payer: Self-pay | Admitting: Orthopedic Surgery

## 2013-03-27 ENCOUNTER — Encounter: Payer: Self-pay | Admitting: Nurse Practitioner

## 2013-03-27 ENCOUNTER — Ambulatory Visit (INDEPENDENT_AMBULATORY_CARE_PROVIDER_SITE_OTHER): Payer: BC Managed Care – PPO | Admitting: Nurse Practitioner

## 2013-03-27 ENCOUNTER — Telehealth: Payer: Self-pay | Admitting: *Deleted

## 2013-03-27 VITALS — BP 148/80 | HR 67 | Ht 74.0 in | Wt 208.4 lb

## 2013-03-27 DIAGNOSIS — Z01818 Encounter for other preprocedural examination: Secondary | ICD-10-CM

## 2013-03-27 DIAGNOSIS — I259 Chronic ischemic heart disease, unspecified: Secondary | ICD-10-CM

## 2013-03-27 NOTE — Telephone Encounter (Signed)
S/w with Tammy at Dr. Maryjean Ka office @ [phone] to let office know saw pt in office am and did not get cleared for rotary cuff surgery pt was having tomorrow pt has to get stress test nuclear, pt was concerned about pre-op labs I related message to Dr. Stark Jock office and assured labs would be cancelled. Tammy stated will I be faxing ov note over I stated Lawson Fiscal already routed Dr. Teressa Senter her note.

## 2013-03-27 NOTE — Progress Notes (Signed)
Derrick [name] Date of Birth: May 23, 1956 Medical Record #[id number]  History of Present Illness: Derrick [name] is seen back today for a pre op clearance visit. Seen for Dr. Elease Hashimoto. Former patient of Dr. Silva Bandy. Has not been seen here since June of 2012. He has known CAD with prior stents to the LAD and LCX in 2006. He has a chronically occluded RCA. His other issues include HLD and he has been statin intolerant.   Last seen in June of 2012 - was doing ok but not exercising or not eating right. No chest pain reported.   Comes in today. Here alone. Planning on right rotator cuff surgery per Dr. Teressa Senter for tomorrow.  He has been having some chest tightness and a burning sensation at times in his chest. He does not exercise. Not really short of breath. Not dizzy or lightheaded. Has had no follow up stress testing since his PCI 8 years ago. No longer on statin - his PCP checks his labs. Creatinine has increased and he has been taken off of his Mobic which has caused more joint pain. BP above goal. Losartan just increased.   Current Outpatient Prescriptions  Medication Sig Dispense Refill  . aspirin 325 MG tablet Take 325 mg by mouth daily.        . calcitRIOL (ROCALTROL) 0.5 MCG capsule Take 0.5 mcg by mouth daily.      . calcium-vitamin D (OSCAL WITH D) 500-200 MG-UNIT per tablet Take 2 tablets by mouth 2 (two) times daily.      Marland Kitchen ezetimibe (ZETIA) 10 MG tablet Take 10 mg by mouth daily.        . fenofibrate 160 MG tablet Take 160 mg by mouth daily.      . fexofenadine ([name]) 180 MG tablet Take 180 mg by mouth daily.      . fish oil-omega-3 fatty acids 1000 MG capsule Take 4,800 mg by mouth daily.       Marland Kitchen glucosamine-chondroitin 500-400 MG tablet Take 1 tablet by mouth 3 (three) times daily.      Marland Kitchen losartan (COZAAR) 50 MG tablet Take 100 mg by mouth daily.       . ranitidine (ZANTAC) 150 MG tablet Take 150 mg by mouth 2 (two) times daily.      . traZODone (DESYREL) 50 MG tablet Take 50 mg by  mouth at bedtime.      . nitroGLYCERIN (NITROSTAT) 0.4 MG SL tablet Place 1 tablet (0.4 mg total) under the tongue every 5 (five) minutes as needed for chest pain.  25 tablet  12   No current facility-administered medications for this visit.    Allergies  Allergen Reactions  . Atorvastatin     Muscle aches  . Codeine Itching  . Crestor [Rosuvastatin Calcium]     Muscle aches    Past Medical History  Diagnosis Date  . Hyperparathyroidism   . Hypertension   . Dyslipidemia   . OA (osteoarthritis)   . Metatarsalgia   . Hyperlipidemia     statin intolerant  . GERD (gastroesophageal reflux disease)   . Coronary artery disease 2006    stents x2=LAD LCX; totally occluded RCA  . Seasonal allergies   . Insomnia     Past Surgical History  Procedure Laterality Date  . Cervical spine surgery  2004  . Cardiac catheterization  10/28/2004    circ and mid LAD chyper stents  . Cervical fusion  [id number]  . Incision and drainage abscess posterior cervicalspine  2009  . Elbow arthroplasty  1994    right  . Colonoscopy      History  Smoking status  . Former Smoker  . Types: Cigarettes  . Quit date: 05/30/1990  Smokeless tobacco  . Not on file    History  Alcohol Use  . Yes    Comment: one drink a month    Family History  Problem Relation Age of Onset  . Arthritis Mother   . Lung cancer Father     Review of Systems: The review of systems is per the HPI.  All other systems were reviewed and are negative.  Physical Exam: BP 148/80  Pulse 67  Ht 6\' 2"  (1.88 m)  Wt 208 lb 6.4 oz (94.53 kg)  BMI 26.75 kg/m2 Patient is very pleasant and in no acute distress. Skin is warm and dry. Color is normal.  HEENT is unremarkable. Normocephalic/atraumatic. PERRL. Sclera are nonicteric. Neck is supple. No masses. No JVD. Lungs are clear. Cardiac exam shows a regular rate and rhythm. Abdomen is soft. Extremities are without edema. Gait and ROM are intact. No gross neurologic deficits  noted.  LABORATORY DATA: EKG today shows sinus rhythm. He has nonspecific ST/T wave changes.   Lab Results  Component Value Date   WBC 7.5 03/18/2008   HGB 12.6* 03/18/2008   HCT 38.5* 03/18/2008   PLT 386 03/18/2008   GLUCOSE 90 03/18/2008   ALT 22 02/15/2008   AST 27 02/15/2008   NA 138 03/18/2008   K 3.7 03/18/2008   CL 102 03/18/2008   CREATININE 1.03 03/18/2008   BUN 10 03/18/2008   CO2 25 03/18/2008   INR 1.0 03/18/2008   DATE OF PROCEDURE: 10/28/2004   CARDIAC CATHETERIZATION  INDICATIONS: A 57 year old white male who presented with chest pain. He had  a stress Cardiolite study which was markedly abnormal showing significant  multivessel ischemia. Subsequent cardiac catheterization demonstrated three-  vessel obstructive coronary disease. He had occlusion of the right coronary  which was supplied by collateral flow. He had a focal 90% stenosis in the  midLAD as well as a focal 95% stenosis in the midcircumflex coronary. It was  felt the patient was a candidate for two-vessel stenting. The patient had a  6-French arterial sheath in place from his diagnostic cardiac  catheterization.  EQUIPMENT USED: A 6-French 3.5 left Voda, a 0.014 high-torque floppy extra  support wire, a 3.0 x 12 mm Maverick balloon, a 3.0 x 13 mm Cypher stent for  the midcircumflex, a 3.0 x 18 mm Cypher stent for the midLAD and a 3.0 x 13  mm PowerSail balloon.  TOTAL CONTRAST: Total contrast of the diagnostic and interventional  procedure was 305 cc of Omnipaque. The patient remained hemodynamically  stable throughout its intervention. During balloon inflations in the midLAD,  he did have ST elevation in the precordial leads. He had minimal chest pain.  There were no complications.  PROCEDURE NOTE: We initially address the lesion in the midcircumflex. This  lesion was crossed without difficulty and predilated using a 3.0 x 12 mm  Maverick balloon to 6 atmospheres. We then placed a 3.0 x 13 mm  Cypher drug-  eluting stent across lesion and this was deployed at 11 atmospheres and  postdilated with the stent balloon to 14 atmospheres. This yielded an  excellent angiographic result was 0% residual stenosis.  We then addressed the lesion in the midLAD. This was more of a segmental  stenosis. It also was predilated using  a 3.0 x 12 mm Maverick balloon to 6  atmospheres. We then stented this lesion using a 3.0 x 18 mm Cypher stent  deploying at 11 atmospheres and postdilated with the stent balloon to 12  atmospheres. There was still some irregularity in the mid stent, however,  and so we used a 3.0 x 13 millimeter PowerSail balloon to post dilate up to  16 atmospheres. This yielded an excellent angiographic result with 0%  residual stenosis and TIMI grade III flow.  Medications during the procedure included 25 mcg of fentanyl, 1 milligram of  Versed, Integrilin double bolus at 180 mcg/kg followed by continuous  infusion of 2 mcg/kg per minute, nitroglycerin 200 mcg intracoronary x2,  heparin 5000 units IV with subsequent ACT of 256.  FINAL ASSESSMENT: Successful two-vessel intracoronary stenting of the  midleft anterior descending artery and the midleft circumflex coronary.  PMJ/MEDQ D: 10/28/2004 T: 10/28/2004 Job: [id number]    Assessment / Plan: 1. CAD with remote PCI to the mid LAD and mid LCX - has occluded RCA chronically - does endorse chest tightness over the past several weeks - will need stress testing before further disposition can be made.   2. Pre op clearance - I am not willing to clear him at this time. His surgery will need to be postponed.   3. HLD - statin intolerant - labs are checked by his PCP - Derrick Hazel, Derrick [name]  Patient is agreeable to this plan and will call if any problems develop in the interim.   Derrick Macadamia, RN, ANP-C [facility] [address] [place], [place]  [address]

## 2013-03-27 NOTE — Patient Instructions (Addendum)
Continue with your current medicines  We will arrange for a stress test (stress Myoview)  Your surgery will need to be postponed  Call the [facility] office at [phone] if you have any questions, problems or concerns.

## 2013-03-28 ENCOUNTER — Ambulatory Visit (HOSPITAL_BASED_OUTPATIENT_CLINIC_OR_DEPARTMENT_OTHER)
Admission: RE | Admit: 2013-03-28 | Payer: BC Managed Care – PPO | Source: Ambulatory Visit | Admitting: Orthopedic Surgery

## 2013-03-28 HISTORY — DX: Other seasonal allergic rhinitis: J30.2

## 2013-03-28 HISTORY — DX: Insomnia, unspecified: G47.00

## 2013-03-28 HISTORY — DX: Gastro-esophageal reflux disease without esophagitis: K21.9

## 2013-03-28 SURGERY — REPAIR, ROTATOR CUFF, OPEN
Anesthesia: General | Laterality: Right

## 2013-04-01 ENCOUNTER — Ambulatory Visit (HOSPITAL_COMMUNITY): Payer: BC Managed Care – PPO | Attending: Cardiology | Admitting: Radiology

## 2013-04-01 VITALS — BP 138/72 | HR 68 | Ht 74.0 in | Wt 204.0 lb

## 2013-04-01 DIAGNOSIS — R079 Chest pain, unspecified: Secondary | ICD-10-CM

## 2013-04-01 DIAGNOSIS — E785 Hyperlipidemia, unspecified: Secondary | ICD-10-CM | POA: Insufficient documentation

## 2013-04-01 DIAGNOSIS — R0789 Other chest pain: Secondary | ICD-10-CM | POA: Insufficient documentation

## 2013-04-01 DIAGNOSIS — I2510 Atherosclerotic heart disease of native coronary artery without angina pectoris: Secondary | ICD-10-CM

## 2013-04-01 DIAGNOSIS — I10 Essential (primary) hypertension: Secondary | ICD-10-CM | POA: Insufficient documentation

## 2013-04-01 DIAGNOSIS — Z01810 Encounter for preprocedural cardiovascular examination: Secondary | ICD-10-CM | POA: Insufficient documentation

## 2013-04-01 DIAGNOSIS — Z87891 Personal history of nicotine dependence: Secondary | ICD-10-CM | POA: Insufficient documentation

## 2013-04-01 DIAGNOSIS — I259 Chronic ischemic heart disease, unspecified: Secondary | ICD-10-CM

## 2013-04-01 DIAGNOSIS — Z01818 Encounter for other preprocedural examination: Secondary | ICD-10-CM

## 2013-04-01 MED ORDER — TECHNETIUM TC 99M SESTAMIBI GENERIC - CARDIOLITE
30.0000 | Freq: Once | INTRAVENOUS | Status: AC | PRN
  Administered 2013-04-01: 30 via INTRAVENOUS

## 2013-04-01 MED ORDER — REGADENOSON 0.4 MG/5ML IV SOLN
0.4000 mg | Freq: Once | INTRAVENOUS | Status: AC
  Administered 2013-04-01: 0.4 mg via INTRAVENOUS

## 2013-04-01 MED ORDER — TECHNETIUM TC 99M SESTAMIBI GENERIC - CARDIOLITE
10.0000 | Freq: Once | INTRAVENOUS | Status: AC | PRN
  Administered 2013-04-01: 10 via INTRAVENOUS

## 2013-04-01 NOTE — Progress Notes (Signed)
[facility] SITE 3 NUCLEAR MED [address] [place], [place] [address] [phone]    Cardiology Nuclear Med Study  MARQUES ERICSON is a 57 y.o. male     MRN : [id number]     DOB: 09/07/1955  Procedure Date: 04/01/2013  Nuclear Med Background Indication for Stress Test:  Evaluation for Ischemia and Surgical Clearance for Pending Rotator Cuff Repair with Dr. Josephine Igo History:  Stents; '06 [id number] (no report) Cardiac Risk Factors: History of Smoking, Hypertension and Lipids  Symptoms:  Chest Tightness (last date of chest discomfort was on 03/28/13) and Rapid HR   Nuclear Pre-Procedure Caffeine/Decaff Intake:  None NPO After: 8:00pm   Lungs:  Clear. O2 Sat: 98% on room air. IV 0.9% NS with Angio Cath:  22g  IV Site: R Hand  IV Started by:  Cathlyn Parsons, RN  Chest Size (in):  46 Cup Size: n/a  Height: 6\' 2"  (1.88 m)  Weight:  204 lb (92.534 kg)  BMI:  Body mass index is 26.18 kg/(m^2). Tech Comments:  n/a    Nuclear Med Study 1 or 2 day study: 1 day  Stress Test Type:  Treadmill/Lexiscan  Reading MD: Olga Millers, MD  Order Authorizing Provider:  Lucina Mellow, MD and Norma Fredrickson, NP  Resting Radionuclide: Technetium 42m Sestamibi  Resting Radionuclide Dose: 11.0 mCi   Stress Radionuclide:  Technetium 60m Sestamibi  Stress Radionuclide Dose: 32.9 mCi           Stress Protocol Rest HR: 68 Stress HR: 134  Rest BP: 138/72 Stress BP: 163/65  Exercise Time (min): 2:00 METS: n/a   Predicted Max HR: 161 bpm % Max HR: 83.23 bpm Rate Pressure Product: 96295   Dose of Adenosine (mg):  n/a Dose of Lexiscan: 0.4 mg  Dose of Atropine (mg): n/a Dose of Dobutamine: n/a mcg/kg/min (at max HR)  Stress Test Technologist: Smiley Houseman, CMA-N  Nuclear Technologist:  Dario Guardian, CNMT     Rest Procedure:  Myocardial perfusion imaging was performed at rest 45 minutes following the intravenous administration of Technetium 69m Sestamibi.  Rest ECG: NSR -  Normal EKG  Stress Procedure:  The patient received IV Lexiscan 0.4 mg over 15-seconds with concurrent low level exercise and then Technetium 75m Sestamibi was injected at 30-seconds while the patient continued walking one more minute. He c/o shortness of breath with Lexiscan.   Quantitative spect images were obtained after a 45-minute delay.  Stress ECG: No significant ST segment change suggestive of ischemia.  QPS Raw Data Images:  Acquisition technically good; normal left ventricular size. Stress Images:  There is decreased uptake in the inferior wall. Rest Images:  There is decreased uptake in the inferior wall, slightly less prominent compared to the stress images Subtraction (SDS):  These findings are consistent with prior inferior infarct and very mild peri-infarct ischemia. Transient Ischemic Dilatation (Normal <1.22):  0.93 Lung/Heart Ratio (Normal <0.45):  0.32  Quantitative Gated Spect Images QGS EDV:  123 ml QGS ESV:  57 ml  Impression Exercise Capacity:  Lexiscan with low level exercise. BP Response:  Normal blood pressure response. Clinical Symptoms:  There is dyspnea. ECG Impression:  No significant ST segment change suggestive of ischemia. Comparison with Prior Nuclear Study: No images to compare  Overall Impression:  Low risk stress nuclear study with a large, severe intensity, partially reversible inferior defect consistent with prior inferior infarct and very mild peri-infarct ischemia.  LV Ejection Fraction: 54%.  LV Wall Motion:  Inferior  hypokinesis.   Olga Millers

## 2013-04-04 ENCOUNTER — Telehealth: Payer: Self-pay | Admitting: Cardiovascular Disease

## 2013-04-04 NOTE — Telephone Encounter (Signed)
Dr. Teressa Senter was sent a copy of stress test that gives clearance for shoulder surgery. Pt aware.

## 2013-04-04 NOTE — Telephone Encounter (Signed)
Follow Up  Pt states he received a missed call after the results were explained to him// He requests a call back if necessary

## 2013-04-04 NOTE — Telephone Encounter (Signed)
Results were given/ no return call needed

## 2013-04-04 NOTE — Telephone Encounter (Signed)
New Problem ° °Pt calling for results.  °

## 2013-04-09 ENCOUNTER — Telehealth: Payer: Self-pay | Admitting: Cardiovascular Disease

## 2013-04-09 NOTE — Telephone Encounter (Signed)
New Problem  Pt calling for cardiac clearance the Shoulder Procedure at [facility]// please call back

## 2013-04-09 NOTE — Telephone Encounter (Signed)
Faxed study to [phone] per patient's request.

## 2013-04-09 NOTE — Telephone Encounter (Signed)
Patient aware that the cardiac clearance was already sent to Dr.Sypher today.

## 2013-04-09 NOTE — Telephone Encounter (Signed)
New message     New fax # [phone] to fax clearance for surgery for Dr Teressa Senter.  They never got other clearance that was faxed.

## 2013-04-09 NOTE — Telephone Encounter (Signed)
msg left at number provided, cardiac clearance was sent to Dr Teressa Senter. Call with further questions.

## 2013-04-09 NOTE — Telephone Encounter (Signed)
New Problem:  Pt states he is calling reference to surgical clearance. Pt would like his clearance faxed to... Fax  # [phone] Dr. Teressa Senter

## 2013-04-12 ENCOUNTER — Other Ambulatory Visit: Payer: Self-pay | Admitting: Orthopedic Surgery

## 2013-04-18 ENCOUNTER — Encounter (HOSPITAL_BASED_OUTPATIENT_CLINIC_OR_DEPARTMENT_OTHER): Payer: Self-pay | Admitting: *Deleted

## 2013-04-18 NOTE — Progress Notes (Signed)
Cleared for surgery-will come in for bmet-bring all meds and overnight bag

## 2013-04-19 ENCOUNTER — Encounter (HOSPITAL_BASED_OUTPATIENT_CLINIC_OR_DEPARTMENT_OTHER)
Admission: RE | Admit: 2013-04-19 | Discharge: 2013-04-19 | Disposition: A | Discharge status: Home / Self Care | Payer: BC Managed Care – PPO | Source: Ambulatory Visit | Attending: Orthopedic Surgery | Admitting: Orthopedic Surgery

## 2013-04-19 DIAGNOSIS — Z01818 Encounter for other preprocedural examination: Secondary | ICD-10-CM | POA: Insufficient documentation

## 2013-04-19 DIAGNOSIS — Z01812 Encounter for preprocedural laboratory examination: Secondary | ICD-10-CM | POA: Insufficient documentation

## 2013-04-19 LAB — BASIC METABOLIC PANEL
BUN: 22 mg/dL (ref 6–23)
CO2: 26 mEq/L (ref 19–32)
Calcium: 9.7 mg/dL (ref 8.4–10.5)
Chloride: 100 mEq/L (ref 96–112)
Creatinine, Ser: 1.44 mg/dL — ABNORMAL HIGH (ref 0.50–1.35)
GFR calc Af Amer: 60 mL/min — ABNORMAL LOW (ref >90)
GFR calc non Af Amer: 52 mL/min — ABNORMAL LOW (ref >90)
Glucose, Bld: 100 mg/dL — ABNORMAL HIGH (ref 70–99)
Potassium: 4.2 mEq/L (ref 3.5–5.1)
Sodium: 138 mEq/L (ref 135–145)

## 2013-04-22 NOTE — H&P (Signed)
Derrick [name] is an 57 y.o. male.   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  He has had pain in the region of his right shoulder since February, 2014.  He noted his discomfort after lifting boxes repetitively at work weighing 1 to 72 pounds.  He has been a 40 year employee of [employer].  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  He has had progressive weakness of abduction, scaption and flexion, crepitation with motion , difficulty sleeping on the right shoulder at night.  He has no symptoms on the left side.  He recently tore the long head of his biceps. He now seeks an upper extremity orthopaedic consult.     Past Medical History  Diagnosis Date  . Hyperparathyroidism   . Hypertension   . Dyslipidemia   . OA (osteoarthritis)   . Metatarsalgia   . Hyperlipidemia     statin intolerant  . GERD (gastroesophageal reflux disease)   . Coronary artery disease 2006    stents x2=LAD LCX; totally occluded RCA  . Seasonal allergies   . Insomnia     Past Surgical History  Procedure Laterality Date  . Cervical spine surgery  2004  . Cardiac catheterization  10/28/2004    circ and mid LAD chyper stents  . Cervical fusion  [id number]  . Incision and drainage abscess posterior cervicalspine  2009  . Elbow arthroplasty  1994    right  . Colonoscopy      Family History  Problem Relation Age of Onset  . Arthritis Mother   . Lung cancer Father    Social History:  reports that he quit smoking about 22 years ago. His smoking use included Cigarettes. He smoked 0.00 packs per day. He does not have any smokeless tobacco history on file. He reports that he drinks alcohol. He reports that he does not use illicit drugs.  Allergies:  Allergies  Allergen Reactions  . Atorvastatin     Muscle aches  .  Codeine Itching  . Crestor [Rosuvastatin Calcium]     Muscle aches    No prescriptions prior to admission    No results found for this or any previous visit (from the past 48 hour(s)).  No results found.   Pertinent items are noted in HPI.  Height 6\' 2"  (1.88 m), weight 92.534 kg (204 lb).  General appearance: alert Head: Normocephalic, without obvious abnormality Neck: supple, symmetrical, trachea midline Resp: clear to auscultation bilaterally Cardio: regular rate and rhythm GI: normal findings: bowel sounds normal Extremities:   Inspection of his shoulders reveals change in the contour of his right long head of the biceps with the typical distal prominence. His shoulder range of motion reveals combined elevation right shoulder/170, left shoulder/175, external rotation 90 degrees abduction 90/right, 95/left.  Internal rotation right shoulder/T-10, left shoulder T-10.  He can extend to the interscapular plane bilaterally.  He does not show signs of adhesive capsulitis.  Strength testing reveals weakness of abduction, scaption, forward flexion.  He has a painful cross-torso adduction and Hawkins maneuver.  He has a painful push-off test.  Palpation of his greater tuberosity reveals a bony prominent consistent with bone growth following rotator cuff tear.    X-rays of his right shoulder AP internal and external rotation views and  an outlet view demonstrate advanced arthritis of the acromioclavicular joint, an osteophyte at the inferior glenoid, new bone growth of the greater tuberosity consistent with rotator cuff tear.     He had a MRI at [facility] on 03/11/13.  He has a full thickness retracted rotator cuff involving the supraspinatus.  He has unfavorable AC anatomy.    Pulses: 2+ and symmetric Skin: normal Neurologic: Grossly normal    Assessment/Plan Impression: Right shoulder impingement with A/C arthrosis and RC tear  Plan: To the OR for right shoulder scope with  SAD/DCR and RC repair as needed.The procedure, risks,benefits and post-op course were discussed with the patient at length and they were in agreement with the plan.   DASNOIT,Sani Madariaga J 04/22/2013, 3:08 PM   H&P documentation: 04/23/2013  -History and Physical Reviewed  -Patient has been re-examined  -No change in the plan of care  Wyn Forster, MD

## 2013-04-23 ENCOUNTER — Encounter (HOSPITAL_BASED_OUTPATIENT_CLINIC_OR_DEPARTMENT_OTHER): Payer: Self-pay | Admitting: Orthopedic Surgery

## 2013-04-23 ENCOUNTER — Encounter (HOSPITAL_BASED_OUTPATIENT_CLINIC_OR_DEPARTMENT_OTHER)
Admission: RE | Disposition: A | Discharge status: Home / Self Care | Payer: Self-pay | Source: Ambulatory Visit | Attending: Orthopedic Surgery

## 2013-04-23 ENCOUNTER — Ambulatory Visit (HOSPITAL_BASED_OUTPATIENT_CLINIC_OR_DEPARTMENT_OTHER): Payer: BC Managed Care – PPO | Admitting: Certified Registered Nurse Anesthetist

## 2013-04-23 ENCOUNTER — Ambulatory Visit (HOSPITAL_BASED_OUTPATIENT_CLINIC_OR_DEPARTMENT_OTHER)
Admission: RE | Admit: 2013-04-23 | Discharge: 2013-04-24 | Disposition: A | Discharge status: Home / Self Care | Payer: BC Managed Care – PPO | Source: Ambulatory Visit | Attending: Orthopedic Surgery | Admitting: Orthopedic Surgery

## 2013-04-23 ENCOUNTER — Encounter (HOSPITAL_BASED_OUTPATIENT_CLINIC_OR_DEPARTMENT_OTHER): Payer: BC Managed Care – PPO | Admitting: Certified Registered Nurse Anesthetist

## 2013-04-23 DIAGNOSIS — I10 Essential (primary) hypertension: Secondary | ICD-10-CM | POA: Insufficient documentation

## 2013-04-23 DIAGNOSIS — Z01812 Encounter for preprocedural laboratory examination: Secondary | ICD-10-CM | POA: Insufficient documentation

## 2013-04-23 DIAGNOSIS — M67919 Unspecified disorder of synovium and tendon, unspecified shoulder: Secondary | ICD-10-CM | POA: Insufficient documentation

## 2013-04-23 DIAGNOSIS — G4700 Insomnia, unspecified: Secondary | ICD-10-CM | POA: Insufficient documentation

## 2013-04-23 DIAGNOSIS — E785 Hyperlipidemia, unspecified: Secondary | ICD-10-CM | POA: Insufficient documentation

## 2013-04-23 DIAGNOSIS — M75101 Unspecified rotator cuff tear or rupture of right shoulder, not specified as traumatic: Secondary | ICD-10-CM | POA: Diagnosis present

## 2013-04-23 DIAGNOSIS — I2510 Atherosclerotic heart disease of native coronary artery without angina pectoris: Secondary | ICD-10-CM | POA: Insufficient documentation

## 2013-04-23 DIAGNOSIS — K219 Gastro-esophageal reflux disease without esophagitis: Secondary | ICD-10-CM | POA: Insufficient documentation

## 2013-04-23 DIAGNOSIS — M1990 Unspecified osteoarthritis, unspecified site: Secondary | ICD-10-CM | POA: Insufficient documentation

## 2013-04-23 DIAGNOSIS — M898X9 Other specified disorders of bone, unspecified site: Secondary | ICD-10-CM | POA: Insufficient documentation

## 2013-04-23 DIAGNOSIS — S62329A Displaced fracture of shaft of unspecified metacarpal bone, initial encounter for closed fracture: Secondary | ICD-10-CM | POA: Diagnosis present

## 2013-04-23 DIAGNOSIS — E213 Hyperparathyroidism, unspecified: Secondary | ICD-10-CM | POA: Insufficient documentation

## 2013-04-23 DIAGNOSIS — M24119 Other articular cartilage disorders, unspecified shoulder: Secondary | ICD-10-CM | POA: Insufficient documentation

## 2013-04-23 DIAGNOSIS — M719 Bursopathy, unspecified: Secondary | ICD-10-CM | POA: Insufficient documentation

## 2013-04-23 DIAGNOSIS — M19019 Primary osteoarthritis, unspecified shoulder: Secondary | ICD-10-CM | POA: Insufficient documentation

## 2013-04-23 DIAGNOSIS — M249 Joint derangement, unspecified: Secondary | ICD-10-CM | POA: Insufficient documentation

## 2013-04-23 DIAGNOSIS — Z87891 Personal history of nicotine dependence: Secondary | ICD-10-CM | POA: Insufficient documentation

## 2013-04-23 HISTORY — PX: SHOULDER OPEN ROTATOR CUFF REPAIR: SHX2407

## 2013-04-23 LAB — POCT HEMOGLOBIN-HEMACUE: Hemoglobin: 12.2 g/dL — ABNORMAL LOW (ref 13.0–17.0)

## 2013-04-23 SURGERY — REPAIR, ROTATOR CUFF, OPEN
Anesthesia: General | Site: Shoulder | Laterality: Right | Wound class: Clean

## 2013-04-23 MED ORDER — NITROGLYCERIN 0.4 MG SL SUBL
0.4000 mg | SUBLINGUAL_TABLET | SUBLINGUAL | Status: DC | PRN
Start: 2013-04-23 — End: 2013-04-24

## 2013-04-23 MED ORDER — ONDANSETRON HCL 4 MG/2ML IJ SOLN
INTRAMUSCULAR | Status: DC | PRN
  Administered 2013-04-23: 4 mg via INTRAVENOUS

## 2013-04-23 MED ORDER — CEFAZOLIN SODIUM-DEXTROSE 2-3 GM-% IV SOLR
INTRAVENOUS | Status: AC
  Filled 2013-04-23: qty 100

## 2013-04-23 MED ORDER — OXYCODONE HCL 5 MG PO TABS
5.0000 mg | ORAL_TABLET | Freq: Once | ORAL | Status: AC | PRN
  Administered 2013-04-23: 5 mg via ORAL

## 2013-04-23 MED ORDER — ROPIVACAINE HCL 5 MG/ML IJ SOLN
INTRAMUSCULAR | Status: DC | PRN
  Administered 2013-04-23: 25 mL via PERINEURAL

## 2013-04-23 MED ORDER — LIDOCAINE HCL 4 % MT SOLN
OROMUCOSAL | Status: DC | PRN
  Administered 2013-04-23: 5 mL via TOPICAL

## 2013-04-23 MED ORDER — PROPOFOL 10 MG/ML IV BOLUS
INTRAVENOUS | Status: DC | PRN
  Administered 2013-04-23: 250 mg via INTRAVENOUS

## 2013-04-23 MED ORDER — CHLORHEXIDINE GLUCONATE 4 % EX LIQD: 60.0000 mL | Freq: Once | CUTANEOUS | Status: DC

## 2013-04-23 MED ORDER — METOCLOPRAMIDE HCL 5 MG/ML IJ SOLN: 10.0000 mg | Freq: Once | INTRAMUSCULAR | Status: DC | PRN

## 2013-04-23 MED ORDER — DEXAMETHASONE SODIUM PHOSPHATE 4 MG/ML IJ SOLN
INTRAMUSCULAR | Status: DC | PRN
  Administered 2013-04-23: 10 mg via INTRAVENOUS

## 2013-04-23 MED ORDER — ZOLPIDEM TARTRATE 5 MG PO TABS
ORAL_TABLET | ORAL | Status: AC
  Filled 2013-04-23: qty 1

## 2013-04-23 MED ORDER — MIDAZOLAM HCL 2 MG/2ML IJ SOLN
INTRAMUSCULAR | Status: AC
  Filled 2013-04-23: qty 2

## 2013-04-23 MED ORDER — CEFAZOLIN SODIUM-DEXTROSE 2-3 GM-% IV SOLR
2.0000 g | Freq: Four times a day (QID) | INTRAVENOUS | Status: AC
  Administered 2013-04-23 – 2013-04-24 (×3): 2 g via INTRAVENOUS

## 2013-04-23 MED ORDER — HYDROMORPHONE HCL 2 MG PO TABS
ORAL_TABLET | ORAL | Status: AC
  Filled 2013-04-23: qty 2

## 2013-04-23 MED ORDER — METOCLOPRAMIDE HCL 5 MG PO TABS: 5.0000 mg | ORAL_TABLET | Freq: Three times a day (TID) | ORAL | Status: DC | PRN

## 2013-04-23 MED ORDER — METHOCARBAMOL 500 MG PO TABS
ORAL_TABLET | ORAL | Status: AC
  Filled 2013-04-23: qty 1

## 2013-04-23 MED ORDER — DEXTROSE-NACL 5-0.45 % IV SOLN
INTRAVENOUS | Status: DC
  Administered 2013-04-23: 17:00:00 via INTRAVENOUS

## 2013-04-23 MED ORDER — METHOCARBAMOL 100 MG/ML IJ SOLN: 500.0000 mg | Freq: Four times a day (QID) | INTRAVENOUS | Status: DC | PRN

## 2013-04-23 MED ORDER — HYDROMORPHONE HCL PF 1 MG/ML IJ SOLN
0.5000 mg | INTRAMUSCULAR | Status: DC | PRN
  Administered 2013-04-23 – 2013-04-24 (×3): 1 mg via INTRAVENOUS

## 2013-04-23 MED ORDER — HYDROMORPHONE HCL PF 1 MG/ML IJ SOLN
INTRAMUSCULAR | Status: AC
  Filled 2013-04-23: qty 1

## 2013-04-23 MED ORDER — ZOLPIDEM TARTRATE 5 MG PO TABS
5.0000 mg | ORAL_TABLET | Freq: Every evening | ORAL | Status: DC | PRN
  Administered 2013-04-23: 5 mg via ORAL

## 2013-04-23 MED ORDER — SODIUM CHLORIDE 0.9 % IR SOLN
Status: DC | PRN
  Administered 2013-04-23: 6000 mL

## 2013-04-23 MED ORDER — LOSARTAN POTASSIUM 50 MG PO TABS: 100.0000 mg | ORAL_TABLET | Freq: Every day | ORAL | Status: DC

## 2013-04-23 MED ORDER — HYDROMORPHONE HCL 2 MG PO TABS
2.0000 mg | ORAL_TABLET | ORAL | Status: DC | PRN
  Administered 2013-04-23 – 2013-04-24 (×4): 2 mg via ORAL

## 2013-04-23 MED ORDER — FENTANYL CITRATE 0.05 MG/ML IJ SOLN
50.0000 ug | INTRAMUSCULAR | Status: DC | PRN
  Administered 2013-04-23: 100 ug via INTRAVENOUS

## 2013-04-23 MED ORDER — ONDANSETRON HCL 4 MG/2ML IJ SOLN: 4.0000 mg | Freq: Four times a day (QID) | INTRAMUSCULAR | Status: DC | PRN

## 2013-04-23 MED ORDER — CEFAZOLIN SODIUM-DEXTROSE 2-3 GM-% IV SOLR
2.0000 g | Freq: Once | INTRAVENOUS | Status: AC
  Administered 2013-04-23: 2 g via INTRAVENOUS

## 2013-04-23 MED ORDER — FENTANYL CITRATE 0.05 MG/ML IJ SOLN
INTRAMUSCULAR | Status: AC
  Filled 2013-04-23: qty 2

## 2013-04-23 MED ORDER — CEFAZOLIN SODIUM-DEXTROSE 2-3 GM-% IV SOLR
INTRAVENOUS | Status: AC
  Filled 2013-04-23: qty 50

## 2013-04-23 MED ORDER — ONDANSETRON HCL 4 MG PO TABS: 4.0000 mg | ORAL_TABLET | Freq: Four times a day (QID) | ORAL | Status: DC | PRN

## 2013-04-23 MED ORDER — SUCCINYLCHOLINE CHLORIDE 20 MG/ML IJ SOLN
INTRAMUSCULAR | Status: DC | PRN
  Administered 2013-04-23: 100 mg via INTRAVENOUS

## 2013-04-23 MED ORDER — ONDANSETRON HCL 4 MG/2ML IJ SOLN: INTRAMUSCULAR | Status: DC | PRN

## 2013-04-23 MED ORDER — OXYCODONE HCL 5 MG/5ML PO SOLN: 5.0000 mg | Freq: Once | ORAL | Status: AC | PRN

## 2013-04-23 MED ORDER — TRAZODONE HCL 50 MG PO TABS: 50.0000 mg | ORAL_TABLET | Freq: Every day | ORAL | Status: DC

## 2013-04-23 MED ORDER — HYDROMORPHONE HCL PF 1 MG/ML IJ SOLN
0.2500 mg | INTRAMUSCULAR | Status: DC | PRN
  Administered 2013-04-23 (×2): 0.5 mg via INTRAVENOUS

## 2013-04-23 MED ORDER — METHOCARBAMOL 500 MG PO TABS
500.0000 mg | ORAL_TABLET | Freq: Four times a day (QID) | ORAL | Status: DC | PRN
  Administered 2013-04-23 – 2013-04-24 (×2): 500 mg via ORAL

## 2013-04-23 MED ORDER — HYDROMORPHONE HCL 2 MG PO TABS: ORAL_TABLET | ORAL | Status: DC

## 2013-04-23 MED ORDER — FENTANYL CITRATE 0.05 MG/ML IJ SOLN
INTRAMUSCULAR | Status: DC | PRN
  Administered 2013-04-23 (×4): 25 ug via INTRAVENOUS

## 2013-04-23 MED ORDER — HYDROMORPHONE HCL 2 MG PO TABS
ORAL_TABLET | ORAL | Status: AC
  Filled 2013-04-23: qty 1

## 2013-04-23 MED ORDER — METOCLOPRAMIDE HCL 5 MG/ML IJ SOLN: 5.0000 mg | Freq: Three times a day (TID) | INTRAMUSCULAR | Status: DC | PRN

## 2013-04-23 MED ORDER — EPHEDRINE SULFATE 50 MG/ML IJ SOLN
INTRAMUSCULAR | Status: DC | PRN
  Administered 2013-04-23: 10 mg via INTRAVENOUS

## 2013-04-23 MED ORDER — CEPHALEXIN 500 MG PO CAPS: 500.0000 mg | ORAL_CAPSULE | Freq: Three times a day (TID) | ORAL | Status: DC

## 2013-04-23 MED ORDER — LACTATED RINGERS IV SOLN
INTRAVENOUS | Status: DC
  Administered 2013-04-23 (×2): via INTRAVENOUS

## 2013-04-23 MED ORDER — MIDAZOLAM HCL 2 MG/2ML IJ SOLN
1.0000 mg | INTRAMUSCULAR | Status: DC | PRN
  Administered 2013-04-23: 2 mg via INTRAVENOUS

## 2013-04-23 MED ORDER — NITROGLYCERIN 0.4 MG SL SUBL: 0.4000 mg | SUBLINGUAL_TABLET | SUBLINGUAL | Status: DC | PRN

## 2013-04-23 MED ORDER — FENOFIBRATE 160 MG PO TABS: 160.0000 mg | ORAL_TABLET | Freq: Every day | ORAL | Status: DC

## 2013-04-23 MED ORDER — OXYCODONE HCL 5 MG PO TABS
ORAL_TABLET | ORAL | Status: AC
  Filled 2013-04-23: qty 1

## 2013-04-23 SURGICAL SUPPLY — 84 items
ANCH SUT SWLK 19.1 CLS EYLT TL (Anchor) ×1 IMPLANT
ANCH SUT SWLK 19.1 CLS EYLT VT (Anchor) ×1 IMPLANT
ANCH SUT SWLK 19.1X5.5 CLS EL (Anchor) ×2 IMPLANT
ANCHOR BIO SWLOCK 4.75 W/TIG (Anchor) ×1 IMPLANT
ANCHOR PEEK SWIVEL LOCK 5.5 (Anchor) ×2 IMPLANT
ANCHOR SUT BIO SW 4.75 W/FIB (Anchor) ×1 IMPLANT
BANDAGE ADHESIVE 1X3 (GAUZE/BANDAGES/DRESSINGS) IMPLANT
BLADE AVERAGE 25X9 (BLADE) ×1 IMPLANT
BLADE CUTTER MENIS 5.5 (BLADE) IMPLANT
BLADE SURG 15 STRL LF DISP TIS (BLADE) ×2 IMPLANT
BLADE SURG 15 STRL SS (BLADE) ×2
BUR EGG 3PK/BX (BURR) ×1 IMPLANT
BUR OVAL 6.0 (BURR) ×2 IMPLANT
CANISTER SUCT 3000ML (MISCELLANEOUS) ×1 IMPLANT
CANISTER SUCT LVC 12 LTR MEDI- (MISCELLANEOUS) ×2 IMPLANT
CANNULA SHOULDER 7CM (CANNULA) IMPLANT
CANNULA TWIST IN 8.25X7CM (CANNULA) IMPLANT
CLEANER CAUTERY TIP 5X5 PAD (MISCELLANEOUS) IMPLANT
CUTTER MENISCUS  4.2MM (BLADE) ×1
CUTTER MENISCUS 4.2MM (BLADE) ×1 IMPLANT
DECANTER SPIKE VIAL GLASS SM (MISCELLANEOUS) IMPLANT
DRAPE INCISE IOBAN 66X45 STRL (DRAPES) ×2 IMPLANT
DRAPE STERI 35X30 U-POUCH (DRAPES) ×2 IMPLANT
DRAPE SURG 17X23 STRL (DRAPES) ×2 IMPLANT
DRAPE U-SHAPE 47X51 STRL (DRAPES) ×2 IMPLANT
DRAPE U-SHAPE 76X120 STRL (DRAPES) ×4 IMPLANT
DRSG PAD ABDOMINAL 8X10 ST (GAUZE/BANDAGES/DRESSINGS) ×2 IMPLANT
DURAPREP 26ML APPLICATOR (WOUND CARE) ×2 IMPLANT
ELECT REM PT RETURN 9FT ADLT (ELECTROSURGICAL) ×2
ELECTRODE REM PT RTRN 9FT ADLT (ELECTROSURGICAL) IMPLANT
GLOVE BIOGEL M STRL SZ7.5 (GLOVE) ×2 IMPLANT
GLOVE BIOGEL PI IND STRL 7.0 (GLOVE) IMPLANT
GLOVE BIOGEL PI IND STRL 8 (GLOVE) ×2 IMPLANT
GLOVE BIOGEL PI INDICATOR 7.0 (GLOVE) ×1
GLOVE BIOGEL PI INDICATOR 8 (GLOVE) ×2
GLOVE ECLIPSE 6.5 STRL STRAW (GLOVE) ×1 IMPLANT
GLOVE EXAM NITRILE MD LF STRL (GLOVE) ×1 IMPLANT
GLOVE ORTHO TXT STRL SZ7.5 (GLOVE) ×2 IMPLANT
GOWN BRE IMP PREV XXLGXLNG (GOWN DISPOSABLE) ×4 IMPLANT
GOWN PREVENTION PLUS XLARGE (GOWN DISPOSABLE) ×2 IMPLANT
NDL SCORPION (NEEDLE) ×1 IMPLANT
NDL SUT 6 .5 CRC .975X.05 MAYO (NEEDLE) IMPLANT
NEEDLE MAYO TAPER (NEEDLE) ×2
NEEDLE MINI RC 24MM (NEEDLE) IMPLANT
NEEDLE SCORPION (NEEDLE) ×2 IMPLANT
PACK ARTHROSCOPY DSU (CUSTOM PROCEDURE TRAY) ×2 IMPLANT
PACK BASIN DAY SURGERY FS (CUSTOM PROCEDURE TRAY) ×2 IMPLANT
PAD CLEANER CAUTERY TIP 5X5 (MISCELLANEOUS)
PASSER SUT SWANSON 36MM LOOP (INSTRUMENTS) IMPLANT
PENCIL BUTTON HOLSTER BLD 10FT (ELECTRODE) IMPLANT
SLEEVE SCD COMPRESS KNEE MED (MISCELLANEOUS) ×2 IMPLANT
SLING ARM FOAM STRAP LRG (SOFTGOODS) ×1 IMPLANT
SLING ARM FOAM STRAP MED (SOFTGOODS) IMPLANT
SPONGE GAUZE 4X4 12PLY (GAUZE/BANDAGES/DRESSINGS) ×2 IMPLANT
SPONGE LAP 4X18 X RAY DECT (DISPOSABLE) ×1 IMPLANT
STRIP CLOSURE SKIN 1/2X4 (GAUZE/BANDAGES/DRESSINGS) ×1 IMPLANT
SUCTION FRAZIER TIP 10 FR DISP (SUCTIONS) IMPLANT
SUT ETHIBOND 2 OS 4 DA (SUTURE) IMPLANT
SUT ETHILON 4 0 PS 2 18 (SUTURE) IMPLANT
SUT FIBERWIRE #2 38 T-5 BLUE (SUTURE)
SUT FIBERWIRE 3-0 18 TAPR NDL (SUTURE)
SUT PROLENE 1 CT (SUTURE) IMPLANT
SUT PROLENE 3 0 PS 2 (SUTURE) ×1 IMPLANT
SUT TIGER TAPE 7 IN WHITE (SUTURE) IMPLANT
SUT VIC AB 0 CT1 27 (SUTURE)
SUT VIC AB 0 CT1 27XBRD ANBCTR (SUTURE) IMPLANT
SUT VIC AB 0 SH 27 (SUTURE) ×2 IMPLANT
SUT VIC AB 2-0 SH 27 (SUTURE) ×2
SUT VIC AB 2-0 SH 27XBRD (SUTURE) IMPLANT
SUT VIC AB 3-0 SH 27 (SUTURE)
SUT VIC AB 3-0 SH 27X BRD (SUTURE) IMPLANT
SUT VIC AB 3-0 X1 27 (SUTURE) IMPLANT
SUTURE FIBERWR #2 38 T-5 BLUE (SUTURE) IMPLANT
SUTURE FIBERWR 3-0 18 TAPR NDL (SUTURE) IMPLANT
SYR 3ML 23GX1 SAFETY (SYRINGE) IMPLANT
SYR BULB 3OZ (MISCELLANEOUS) ×1 IMPLANT
TAPE FIBER 2MM 7IN #2 BLUE (SUTURE) IMPLANT
TAPE PAPER 3X10 WHT MICROPORE (GAUZE/BANDAGES/DRESSINGS) ×2 IMPLANT
TOWEL OR 17X24 6PK STRL BLUE (TOWEL DISPOSABLE) ×2 IMPLANT
TUBE CONNECTING 20X1/4 (TUBING) ×3 IMPLANT
TUBING ARTHROSCOPY IRRIG 16FT (MISCELLANEOUS) ×1 IMPLANT
WAND STAR VAC 90 (SURGICAL WAND) ×2 IMPLANT
WATER STERILE IRR 1000ML POUR (IV SOLUTION) ×2 IMPLANT
YANKAUER SUCT BULB TIP NO VENT (SUCTIONS) IMPLANT

## 2013-04-23 NOTE — Op Note (Deleted)
NAMEMarland Kitchen  ISAIH, BULGER NO.:  [id number]  MEDICAL RECORD NO.:  [id number]  LOCATION:                               FACILITY:  [facility]  PHYSICIAN:  Katy Fitch. Thayer Inabinet, M.D. 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  OPERATION: 1. Diagnostic arthroscopy of right glenohumeral joint followed by     arthroscopic debridement of labrum and biceps proximal stump. 2. Arthroscopic subacromial decompression with bursectomy,     coracoacromial ligament relaxation, and acromioplasty. 3. Open resection of distal clavicle and medial acromial osteophyte. 4. Hybrid open repair of supraspinatus and infraspinatus retracted     rotator cuff tears.  OPERATING SURGEON:  Katy Fitch. David Rodriquez, M.D.  ASSISTANT:  Jonni Sanger, P.A.  ANESTHESIA:  General by endotracheal, supplemented by ropivacaine, right plexus block for perioperative comfort.  SUPERVISED ANESTHESIOLOGIST:  Dr. Justin Mend.  INDICATIONS:  Rizwan Kuyper is a 57 year old gentleman referred by Dr. Sigmund Hazel of [facility] for evaluation and management of a significant right rotator cuff shoulder impairment.  Mr. Los had reactive bone formation on his plain films with greater tuberosity evidencing a cuff tear and very unfavorable AC anatomy.  His exam was compatible with a large rotator cuff tear and AC arthritis.  He  was advised to undergo an MRI, which documented a retracted chronic rotator cuff tear involving the supraspinatus and infraspinatus rotator cuff tendons and unfavorable AC arthritis.  We recommended proceeding with diagnostic arthroscopy, subacromial decompression, distal clavicle resection, and repair of the rotator cuff.  Preoperatively, he had had some cardiac symptoms, therefore he had a stress Cardiolite study and was cleared by his attending cardiologist at [facility].  He now presents for elective reconstruction of his right shoulder.  DESCRIPTION OF PROCEDURE:  Jesusmanuel Erbes was interviewed by Dr. Justin Mend in the holding area, and after detailed anesthesia informed consent, had a right ropivacaine plexus block placed with ultrasound guidance for perioperative comfort.  This led excellent anesthesia of the right shoulder and forequarter.  His right arm was marked as the proper surgical site per protocol with a marking pen, and questions were invited and answered in detail in the holding area with his wife and son present.  He was transferred to room #2 of the [facility], where under Dr. Thornton Dales direct supervision, general endotracheal anesthesia was induced with the glide scope.  He was carefully positioned in the beach-chair position with the aid of a torso and head holder designed for shoulder arthroscopy.  The entire right upper extremity and forequarter were prepped with DuraPrep and draped with impervious arthroscopy drapes.  Passive compression devices were applied to his calves and impervious arthroscopy drapes were applied.  Following routine surgical time-out, the shoulder was instrumented through a standard posterior portal with blunt technique.  Diagnostic arthroscopy revealed abundant synovitis within the glenohumeral joint and a large retracted tear of the supraspinatus and infraspinatus tendon.  The subscapularis was intact.  The  hyaline articular cartilage of the glenoid and humeral head were intact.  There was extensive labral degenerative changes, which were debrided to a smooth margin with a 4.2 mm suction shaver.  The proximal stump of the ruptured long head of the biceps was debrided to a stable flush contour.  The distal biceps had displaced through the rotator interval and bicipital groove distally. Subscapularis insertion was carefully inspected with the posterior shift test.  After photographic documentation of pathology, the arthroscope was removed from glenohumeral joint and placed in the subacromial space. Florid bursitis was noted.  After thorough bursectomy, the anatomy of the coracoacromial arch was studied.  A small portion of the coracoacromial ligament was released with cutting cautery followed by use of the suction bur and shaver to level the acromion to a type 1 morphology.  A large medial osteophyte was removed with the suction bur. The distal clavicle was quite prominent.  After documentation with digital camera, the distal centimeter of clavicle was removed arthroscopically except for the most dorsal aspect which was simply not technically possible to remove arthroscopically; therefore, we elected to complete an open resection of the distal clavicle.  After thorough debridement of the subacromial space, hemostasis was achieved and the scope removed.  We then performed a 2.5 cm incision at the distal clavicle. Subperiosteal dissection with release of the tendons of the deltoid and trapezius muscle allowed excellent visualization of the distal clavicle. The distal 15 mm of clavicle was removed with an oscillating saw followed by use of rongeur to smooth the margins.  We then confirmed that the medial osteophyte was removed followed by repair of the fascia and tendon of the deltoid and trapezius with multiple figure-of-eight sutures of 0 Vicryl.  We then performed an anterior/middle  deltoid muscle-splitting incision and performed a bursectomy.  The cuff tear was easily visualized.  We performed further bursectomy with a suction shaver through the incision.  We then decorticated the greater tuberosity and lowered its profile approximately 3 mm to bleeding bone surface.  We placed 2 medial footprint swivel locks that were 5.5 mm. Due to a very hard bone, we punched and tapped.  Performed an anatomic repair of the supraspinatus and infraspinatus with a diamondback technique and medial compression stitch.  Excellent compression of the repair was achieved to the decorticated greater tuberosity.  The scope was then replaced in the glenohumeral joint, and the footprint documented to be anatomic.  After irrigation of the joint and subacromial space, we repaired the deltoid split with figure-of-eight sutures of 0 Vicryl followed by repair of the skin with subcutaneous 2-0 Vicryl and intradermal 3-0 Prolene.  Mr. Summer was placed in a sling and transferred to the recovery room with stable signs.  He will be discharged in the morning after a 23-hour observation in the recovery care center.     Katy Fitch Mikeala Girdler, M.D.     RVS/MEDQ  D:  04/23/2013  T:  04/23/2013  Job:  [id number]  cc:   Sigmund Hazel, M.D.

## 2013-04-23 NOTE — Anesthesia Preprocedure Evaluation (Signed)
Anesthesia Evaluation  Patient identified by MRN, date of birth, ID band Patient awake    Reviewed: Allergy & Precautions, H&P , NPO status , Patient's Chart, lab work & pertinent test results, reviewed documented beta blocker date and time   Airway Mallampati: II TM Distance: >3 FB Neck ROM: full    Dental   Pulmonary former smoker,  breath sounds clear to auscultation        Cardiovascular hypertension, On Medications + CAD and + Cardiac Stents Rhythm:regular     Neuro/Psych negative neurological ROS  negative psych ROS   GI/Hepatic Neg liver ROS, GERD-  Medicated and Controlled,  Endo/Other  negative endocrine ROS  Renal/GU negative Renal ROS  negative genitourinary   Musculoskeletal   Abdominal   Peds  Hematology negative hematology ROS (+)   Anesthesia Other Findings See surgeon's H&P   Reproductive/Obstetrics negative OB ROS                           Anesthesia Physical Anesthesia Plan  ASA: III  Anesthesia Plan: General   Post-op Pain Management:    Induction: Intravenous  Airway Management Planned: Oral ETT  Additional Equipment:   Intra-op Plan:   Post-operative Plan: Extubation in OR  Informed Consent: I have reviewed the patients History and Physical, chart, labs and discussed the procedure including the risks, benefits and alternatives for the proposed anesthesia with the patient or authorized representative who has indicated his/her understanding and acceptance.   Dental Advisory Given  Plan Discussed with: CRNA and Surgeon  Anesthesia Plan Comments:         Anesthesia Quick Evaluation

## 2013-04-23 NOTE — Op Note (Signed)
[id number] 

## 2013-04-23 NOTE — Brief Op Note (Signed)
04/23/2013  2:27 PM  PATIENT:  Derrick [name]  57 y.o. 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  POST-OPERATIVE DIAGNOSIS:  RIGHT ROTATOR CUFF TEAR ACROMIAL CLAVICULAR DEGENERATIVE JOINT DISEASE  PROCEDURE:  ARTHROSCOPIC DEBRIDEMENT OF LABRUM AND BICEPS STUMP, SCOPE SUBACROMIAL DECOMPRESSION AND BURSECTOMY HYBRID REPAIR OF TWO TENDON                            ROTATOR CUFF TEAR AND OPEN DISTAL CLAVICLE EXCISION  SURGEON:  Surgeon(s) and Role:    * Wyn Forster., MD - Primary  PHYSICIAN ASSISTANT:   ASSISTANTS: Mallory Shirk.A-C   ANESTHESIA:   general  EBL:  Total I/O In: 1000 [I.V.:1000] Out: -   BLOOD ADMINISTERED:none  DRAINS: none   LOCAL MEDICATIONS USED:  ROPIVACAINE PLEXUS BLOCK  SPECIMEN:  No Specimen  DISPOSITION OF SPECIMEN:  N/A  COUNTS:  YES  TOURNIQUET:  * No tourniquets in log *  DICTATION: .Other Dictation: Dictation Number [id number]  PLAN OF CARE: Admit for overnight observation  PATIENT DISPOSITION:  PACU - hemodynamically stable.   Delay start of Pharmacological VTE agent (>24hrs) due to surgical blood loss or risk of bleeding: not applicable

## 2013-04-23 NOTE — Op Note (Signed)
NAME:  Derrick [name], Derrick [name]              ACCOUNT NO.:  [id number]  MEDICAL RECORD NO.:  [id number]  LOCATION:                               FACILITY:  [facility]  PHYSICIAN:  Tonetta Napoles V. Donaldson Richter, M.D. 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  OPERATION: 1. Diagnostic arthroscopy of right glenohumeral joint followed by     arthroscopic debridement of labrum and biceps proximal stump. 2. Arthroscopic subacromial decompression with bursectomy,     coracoacromial ligament relaxation, and acromioplasty. 3. Open resection of distal clavicle and medial acromial osteophyte. 4. Hybrid open repair of supraspinatus and infraspinatus retracted     rotator cuff tears.  OPERATING SURGEON:  Donne Baley V. Kohei Antonellis, M.D.  ASSISTANT:  Jaz Mallick J. Dasnoit, P.A.  ANESTHESIA:  General by endotracheal, supplemented by ropivacaine, right plexus block for perioperative comfort.  SUPERVISED ANESTHESIOLOGIST:  Dr. Fredrick.  INDICATIONS:  Derrick [name] is a 57-year-old gentleman referred by Dr. Lisa Miller of [employer] for evaluation and management of a significant right rotator cuff shoulder impairment.  Derrick [name] had reactive bone formation on his plain films with greater tuberosity evidencing a cuff tear and very unfavorable AC anatomy.  His exam was compatible with a large rotator cuff tear and AC arthritis.  He  was advised to undergo an MRI, which documented a retracted chronic rotator cuff tear involving the supraspinatus and infraspinatus rotator cuff tendons and unfavorable AC arthritis.  We recommended proceeding with diagnostic arthroscopy, subacromial decompression, distal clavicle resection, and repair of the rotator cuff.  Preoperatively, he had had some cardiac symptoms, therefore he had a stress Cardiolite study and was cleared by his attending cardiologist at [facility].  He now presents for elective reconstruction of his right shoulder.  DESCRIPTION OF PROCEDURE:  Derrick [name] was interviewed by Dr. Fredrick in the holding area, and after detailed anesthesia informed consent, had a right ropivacaine plexus block placed with ultrasound guidance for perioperative comfort.  This led excellent anesthesia of the right shoulder and forequarter.  His right arm was marked as the proper surgical site per protocol with a marking pen, and questions were invited and answered in detail in the holding area with his wife and son present.  He was transferred to room #2 of the [facility], where under Dr. Frederick's direct supervision, general endotracheal anesthesia was induced with the glide scope.  He was carefully positioned in the beach-chair position with the aid of a torso and head holder designed for shoulder arthroscopy.  The entire right upper extremity and forequarter were prepped with DuraPrep and draped with impervious arthroscopy drapes.  Passive compression devices were applied to his calves and impervious arthroscopy drapes were applied.    Following routine surgical time-out, the shoulder was instrumented through a standard posterior portal with blunt technique.  Diagnostic arthroscopy revealed abundant synovitis within the glenohumeral joint and a large retracted tear of the supraspinatus and infraspinatus tendon.  The subscapularis was intact.  The  hyaline articular cartilage of the glenoid and humeral head were intact.  There was extensive labral degenerative changes, which were debrided to a smooth margin with a 4.2 mm suction shaver.  The proximal stump of the ruptured long head of the biceps was debrided to a stable flush contour.  The distal biceps had displaced through the rotator interval and bicipital groove distally. Subscapularis insertion was carefully inspected with the posterior shift test.  After photographic documentation of pathology, the arthroscope was removed from glenohumeral joint and placed in the subacromial space. Florid bursitis was noted.  After thorough bursectomy, the anatomy of the coracoacromial arch was studied.  A small portion of the coracoacromial ligament was released with cutting cautery followed by use of the suction bur and shaver to level the acromion to a type 1 morphology.  A large medial osteophyte was removed with the suction bur. The distal clavicle was quite prominent.  After documentation with digital camera, the distal centimeter of clavicle was removed arthroscopically except for the most dorsal aspect which was simply not technically possible to remove arthroscopically; therefore, we elected to complete an open resection of the distal clavicle.  After thorough debridement of the subacromial space, hemostasis was achieved and the scope removed.  We then performed a 2.5 cm incision at the distal clavicle. Subperiosteal dissection with release of the tendons of the deltoid and trapezius muscle allowed excellent visualization of the distal clavicle. The distal 15 mm of clavicle was removed with an oscillating saw followed by use of rongeur to smooth the margins.  We then confirmed that the medial osteophyte was removed followed by repair of the fascia and tendon of the deltoid and trapezius with multiple figure-of-eight sutures of 0 Vicryl.  We then performed an anterior/middle  deltoid muscle-splitting incision and performed a bursectomy.  The cuff tear was easily visualized.  We performed further bursectomy with a suction shaver through the incision.  We then decorticated the greater tuberosity and lowered its profile approximately 3 mm to bleeding bone surface.  We placed 2 medial footprint swivel locks that were 5.5 mm. Due to a very hard bone, we punched and tapped.  Performed an anatomic repair of the supraspinatus and infraspinatus with a diamondback technique and medial compression stitch.  Excellent compression of the repair was achieved to the decorticated greater tuberosity.  The scope was then replaced in the glenohumeral joint, and the footprint documented to be anatomic.  After irrigation of the joint and subacromial space, we repaired the deltoid split with figure-of-eight sutures of 0 Vicryl followed by repair of the skin with subcutaneous 2-0 Vicryl and intradermal 3-0 Prolene.  Derrick [name] was placed in a sling and transferred to the recovery room with stable signs.  He will be discharged in the morning after a 23-hour observation in the recovery care center.     Vinod Mikesell V. Ryo Klang, M.D.     RVS/MEDQ  D:  04/23/2013  T:  04/23/2013  Job:  [id number]  cc:   Lisa Miller, M.D. 

## 2013-04-23 NOTE — Anesthesia Postprocedure Evaluation (Signed)
Anesthesia Post Note  Patient: Derrick [name]  Procedure(s) Performed: Procedure(s) (LRB): RIGHT TWO TENDON ROTATOR CUFF REPAIR  SUBACROMIAL DECOMPRESSION AND OPEN DISTAL CLAVICLE RESECTION (Right)  Anesthesia type: General  Patient location: PACU  Post pain: Pain level controlled  Post assessment: Patient's Cardiovascular Status Stable  Last Vitals:  Filed Vitals:   04/23/13 1530  BP:   Pulse: 60  Temp:   Resp: 13    Post vital signs: Reviewed and stable  Level of consciousness: alert  Complications: No apparent anesthesia complications

## 2013-04-23 NOTE — Anesthesia Procedure Notes (Addendum)
Anesthesia Regional Block:  Interscalene brachial plexus block  Pre-Anesthetic Checklist: ,, timeout performed, Correct Patient, Correct Site, Correct Laterality, Correct Procedure, Correct Position, site marked, Risks and benefits discussed,  Surgical consent,  Pre-op evaluation,  At surgeon's request and post-op pain management  Laterality: Right  Prep: chloraprep       Needles:   Needle Type: Other     Needle Length: 9cm  Needle Gauge: 21 and 21 G    Additional Needles:  Procedures: ultrasound guided (picture in chart) Interscalene brachial plexus block Narrative:  Start time: 04/23/2013 10:25 AM End time: 04/23/2013 10:32 AM Injection made incrementally with aspirations every 5 mL.  Performed by: Personally  Anesthesiologist: Aldona Lento, MD  Additional Notes: Ultrasound guidance used to: id relevant anatomy, confirm needle position, local anesthetic spread, avoidance of vascular puncture. Picture saved. No complications. Block performed personally by Janetta Hora. Gelene Mink, MD     Procedure Name: Intubation Date/Time: 04/23/2013 12:37 PM Performed by: Burna Cash Pre-anesthesia Checklist: Patient identified, Emergency Drugs available, Suction available and Patient being monitored Patient Re-evaluated:Patient Re-evaluated prior to inductionOxygen Delivery Method: Circle System Utilized Preoxygenation: Pre-oxygenation with 100% oxygen Intubation Type: IV induction Ventilation: Mask ventilation without difficulty Tube type: Oral Number of attempts: 1 Airway Equipment and Method: stylet and Video-laryngoscopy Placement Confirmation: ETT inserted through vocal cords under direct vision,  positive ETCO2 and breath sounds checked- equal and bilateral Secured at: 23 cm Tube secured with: Tape Dental Injury: Teeth and Oropharynx as per pre-operative assessment

## 2013-04-23 NOTE — Transfer of Care (Signed)
Immediate Anesthesia Transfer of Care Note  Patient: Derrick [name]  Procedure(s) Performed: Procedure(s): RIGHT TWO TENDON ROTATOR CUFF REPAIR  SUBACROMIAL DECOMPRESSION AND OPEN DISTAL CLAVICLE RESECTION (Right)  Patient Location: PACU  Anesthesia Type:GA combined with regional for post-op pain  Level of Consciousness: awake, alert  and oriented  Airway & Oxygen Therapy: Patient Spontanous Breathing and Patient connected to face mask oxygen  Post-op Assessment: Report given to PACU RN and Post -op Vital signs reviewed and stable  Post vital signs: Reviewed and stable  Complications: No apparent anesthesia complications

## 2013-04-23 NOTE — Progress Notes (Signed)
Assisted Dr. Frederick with right, ultrasound guided, interscalene  block. Side rails up, monitors on throughout procedure. See vital signs in flow sheet. Tolerated Procedure well. 

## 2013-04-24 ENCOUNTER — Encounter (HOSPITAL_BASED_OUTPATIENT_CLINIC_OR_DEPARTMENT_OTHER): Payer: Self-pay | Admitting: Orthopedic Surgery

## 2013-04-24 MED ORDER — HYDROMORPHONE HCL 2 MG PO TABS
ORAL_TABLET | ORAL | Status: AC
  Filled 2013-04-24: qty 1

## 2013-04-24 MED ORDER — HYDROMORPHONE HCL PF 1 MG/ML IJ SOLN
INTRAMUSCULAR | Status: AC
  Filled 2013-04-24: qty 1

## 2013-04-24 MED ORDER — METHOCARBAMOL 500 MG PO TABS
ORAL_TABLET | ORAL | Status: AC
  Filled 2013-04-24: qty 1

## 2013-10-01 ENCOUNTER — Other Ambulatory Visit: Payer: Self-pay

## 2013-10-01 ENCOUNTER — Ambulatory Visit (INDEPENDENT_AMBULATORY_CARE_PROVIDER_SITE_OTHER): Payer: BC Managed Care – PPO | Admitting: Cardiovascular Disease

## 2013-10-01 ENCOUNTER — Encounter: Payer: Self-pay | Admitting: Cardiovascular Disease

## 2013-10-01 VITALS — BP 140/78 | HR 71 | Ht 74.0 in | Wt 220.0 lb

## 2013-10-01 DIAGNOSIS — I2510 Atherosclerotic heart disease of native coronary artery without angina pectoris: Secondary | ICD-10-CM

## 2013-10-01 DIAGNOSIS — E785 Hyperlipidemia, unspecified: Secondary | ICD-10-CM

## 2013-10-01 NOTE — Assessment & Plan Note (Signed)
Derrick CanalesSteve is doing well. Not having any episodes of chest pain. He had a normal Myoview study in October, 2014. I've encouraged him to exercise on regular basis.  His lipids are followed by his medical Dr

## 2013-10-01 NOTE — Progress Notes (Signed)
Christoper AllegraSteven R Tackett Date of Birth  09/11/1955 [name] [address]     [address] [place], [place]  [id number] [phone]  Fax  [phone]  Problem List: 1. CAD, status post stents to the LAD left circumflex artery. His chronically occluded right coronary artery 2. hyperlipidemia-intolerant to statins 3. Hypertension  History of Present Illness:  58 yo male with history of CAD - s/p stents to LAD and LCx.  Chronically occluded RCA.  No angina.  He eats a very fatty diet - lots of fried foods.  Not exercising.  He does lots of yard work and has never had any episodes of angina.  Oct 01, 2013:  Viviann SpareSteven was seen by Lawson FiscalLori in Oct. 2014 for pre-op eval prior to rotator cuff surgery.    His myoview study showed:  Low risk stress nuclear study with a large, severe intensity, partially reversible inferior defect consistent with prior inferior infarct and very mild peri-infarct ischemia.  LV Ejection Fraction: 54%. LV Wall Motion: Inferior hypokinesis.  He has shoulder surgery without difficulty.  He's doing well. He denies any chest pain or shortness of breath.  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     Current Outpatient Prescriptions  Medication Sig Dispense Refill  . acetaminophen (TYLENOL ARTHRITIS PAIN) 650 MG CR tablet Take 1 tab twice a day      . aspirin 325 MG tablet Take 325 mg by mouth daily.        . calcitRIOL (ROCALTROL) 0.5 MCG capsule Take 0.5 mcg by mouth daily.      . calcium-vitamin D (OSCAL WITH D) 500-200 MG-UNIT per tablet Take 2 tablets by mouth 2 (two) times daily.      Marland Kitchen. ezetimibe (ZETIA) 10 MG tablet Take 10 mg by mouth daily.        . fenofibrate 160 MG tablet Take 160 mg by mouth daily.      . fexofenadine (ALLEGRA) 180 MG tablet Take 180 mg by mouth daily.      . fish oil-omega-3 fatty acids 1000 MG capsule Take 4,800 mg by mouth daily.       Marland Kitchen. glucosamine-chondroitin 500-400 MG tablet Take 1 tablet by mouth 3  (three) times daily.      Marland Kitchen. losartan (COZAAR) 50 MG tablet Take 100 mg by mouth daily.       . nitroGLYCERIN (NITROSTAT) 0.4 MG SL tablet Place 1 tablet (0.4 mg total) under the tongue every 5 (five) minutes as needed for chest pain.  25 tablet  12  . ranitidine (ZANTAC) 150 MG tablet Take 150 mg by mouth 2 (two) times daily.      . traZODone (DESYREL) 50 MG tablet Take 50 mg by mouth at bedtime.       No current facility-administered medications for this visit.     Allergies  Allergen Reactions  . Atorvastatin     Muscle aches  . Codeine Itching  . Crestor [Rosuvastatin Calcium]     Muscle aches    Past Medical History  Diagnosis Date  . Hyperparathyroidism   . Hypertension   . Dyslipidemia   . OA (osteoarthritis)   . Metatarsalgia   . Hyperlipidemia     statin intolerant  . GERD (gastroesophageal reflux disease)   . Coronary artery disease 2006    stents x2=LAD LCX; totally occluded RCA  . Seasonal allergies   . Insomnia     Past Surgical History  Procedure Laterality Date  . Cervical spine surgery  2004  . Cardiac catheterization  10/28/2004    circ and mid LAD chyper stents  . Cervical fusion  [id number]  . Incision and drainage abscess posterior cervicalspine  2009  . Elbow arthroplasty  1994    right  . Colonoscopy    . Shoulder open rotator cuff repair Right 04/23/2013    Procedure: RIGHT TWO TENDON ROTATOR CUFF REPAIR  SUBACROMIAL DECOMPRESSION AND OPEN DISTAL CLAVICLE RESECTION;  Surgeon: Wyn Forsterobert V Sypher Jr., MD;  Location: [facility];  Service: Orthopedics;  Laterality: Right;    History  Smoking status  . Former Smoker  . Types: Cigarettes  . Quit date: 05/30/1990  Smokeless tobacco  . Not on file    History  Alcohol Use  . Yes    Comment: one drink a month    Family History  Problem Relation Age of Onset  . Arthritis Mother   . Lung cancer Father     Reviw of Systems:  Reviewed in the HPI.  All other systems are  negative.  Physical Exam: BP 140/78  Pulse 71  Ht 6\' 2"  (1.88 m)  Wt 220 lb (99.791 kg)  BMI 28.23 kg/m2 The patient is alert and oriented x 3.  The mood and affect are normal.  The skin is warm and dry.  Color is normal.  The HEENT exam reveals that the sclera are nonicteric.  The mucous membranes are moist.  The carotids are 2+ without bruits.  There is no thyromegaly.  There is no JVD.  The lungs are clear.  The chest wall is non tender.  The heart exam reveals a regular rate with a normal S1 and S2.  There are no murmurs, gallops, or rubs.  The PMI is not displaced.   Abdominal exam reveals good bowel sounds.  There is no guarding or rebound.  There is no hepatosplenomegaly or tenderness.  There are no masses.  Exam of the legs reveal no clubbing, cyanosis, or edema.  The legs are without rashes.  The distal pulses are intact.  Cranial nerves II - XII are intact.  Motor and sensory functions are intact.  The gait is normal.  ECG: Oct 01, 2013: Normal sinus rhythm. He has small inferior Q waves. He has no acute ST or T wave changes. Assessment / Plan:

## 2013-10-01 NOTE — Assessment & Plan Note (Signed)
He has an intolerance to statins. Apparently it caused his CPK to markedly increased. I advised him to come to the lipid clinic but he declined. He would like to just continue had to have his medical doctor check his lipids periodically. I did advise him that were new cholesterol medications that were available that would not cause his CPK ago.

## 2013-10-01 NOTE — Patient Instructions (Signed)
Decrease Aspirin to 81 mg daily     Your physician wants you to follow-up in: 1 year. You will receive a reminder letter in the mail two months in advance. If you don't receive a letter, please call our office to schedule the follow-up appointment.  

## 2014-12-17 ENCOUNTER — Other Ambulatory Visit: Payer: Self-pay | Admitting: Family Medicine

## 2014-12-17 DIAGNOSIS — I739 Peripheral vascular disease, unspecified: Secondary | ICD-10-CM

## 2014-12-23 ENCOUNTER — Ambulatory Visit
Admission: RE | Admit: 2014-12-23 | Discharge: 2014-12-23 | Disposition: A | Discharge status: Home / Self Care | Payer: BLUE CROSS/BLUE SHIELD | Source: Ambulatory Visit | Attending: Family Medicine | Admitting: Family Medicine

## 2014-12-23 DIAGNOSIS — I739 Peripheral vascular disease, unspecified: Secondary | ICD-10-CM

## 2014-12-26 ENCOUNTER — Other Ambulatory Visit: Payer: Self-pay | Admitting: Family Medicine

## 2014-12-26 DIAGNOSIS — R937 Abnormal findings on diagnostic imaging of other parts of musculoskeletal system: Secondary | ICD-10-CM

## 2014-12-26 DIAGNOSIS — I739 Peripheral vascular disease, unspecified: Secondary | ICD-10-CM

## 2014-12-29 ENCOUNTER — Ambulatory Visit
Admission: RE | Admit: 2014-12-29 | Discharge: 2014-12-29 | Disposition: A | Discharge status: Home / Self Care | Payer: BLUE CROSS/BLUE SHIELD | Source: Ambulatory Visit | Attending: Family Medicine | Admitting: Family Medicine

## 2014-12-29 DIAGNOSIS — I739 Peripheral vascular disease, unspecified: Secondary | ICD-10-CM

## 2014-12-29 DIAGNOSIS — R937 Abnormal findings on diagnostic imaging of other parts of musculoskeletal system: Secondary | ICD-10-CM

## 2015-01-02 ENCOUNTER — Other Ambulatory Visit: Payer: Self-pay | Admitting: *Deleted

## 2015-01-02 DIAGNOSIS — I739 Peripheral vascular disease, unspecified: Secondary | ICD-10-CM

## 2015-01-07 ENCOUNTER — Encounter: Payer: Self-pay | Admitting: Vascular Surgery

## 2015-01-08 ENCOUNTER — Ambulatory Visit (HOSPITAL_COMMUNITY)
Admission: RE | Admit: 2015-01-08 | Discharge: 2015-01-08 | Disposition: A | Discharge status: Home / Self Care | Payer: BLUE CROSS/BLUE SHIELD | Source: Ambulatory Visit | Attending: Vascular Surgery | Admitting: Vascular Surgery

## 2015-01-08 ENCOUNTER — Ambulatory Visit (INDEPENDENT_AMBULATORY_CARE_PROVIDER_SITE_OTHER)
Admission: RE | Admit: 2015-01-08 | Discharge: 2015-01-08 | Disposition: A | Discharge status: Home / Self Care | Payer: BLUE CROSS/BLUE SHIELD | Source: Ambulatory Visit | Attending: Vascular Surgery | Admitting: Vascular Surgery

## 2015-01-08 ENCOUNTER — Other Ambulatory Visit: Payer: Self-pay

## 2015-01-08 ENCOUNTER — Ambulatory Visit (INDEPENDENT_AMBULATORY_CARE_PROVIDER_SITE_OTHER): Payer: BLUE CROSS/BLUE SHIELD | Admitting: Vascular Surgery

## 2015-01-08 ENCOUNTER — Encounter: Payer: Self-pay | Admitting: Vascular Surgery

## 2015-01-08 VITALS — BP 150/66 | HR 55 | Temp 97.6°F | Resp 14 | Ht 74.0 in | Wt 209.0 lb

## 2015-01-08 DIAGNOSIS — I739 Peripheral vascular disease, unspecified: Secondary | ICD-10-CM

## 2015-01-08 DIAGNOSIS — I70203 Unspecified atherosclerosis of native arteries of extremities, bilateral legs: Secondary | ICD-10-CM | POA: Diagnosis not present

## 2015-01-08 NOTE — Progress Notes (Signed)
[facility] [place] HISTORY AND PHYSICAL   History of Present Illness:  Patient is a 59 y.o. year old male who presents for evaluation of bilateral leg pain. The patient complains of a burning sensation on the right anterior thigh in the dorsum of both feet. This occurs intermittently. He also complains of pain in both calves left greater than right after walking 100 feet. The pain in the calf resolves fairly quickly by resting. It returns pretty consistently.  He denies rest pain. He denies any nonhealing wounds. He is a former smoker quitting in 1991. The problem has been slowly progressive over the last 2 years. He is having difficulty at work because he can't get around very well.  He has also had an MRI of the lumbar spine which showed some degenerative joint disease..  Other medical problems include coronary artery disease, hyperlipidemia (intolerant to statins), degenerative joint disease all of which are currently stable. He did have an episode of renal insufficiency in the past and was documented as CK D3. The patient states this was related to meloxicam and his kidney function is now normal.  Past Medical History  Diagnosis Date  . Hyperparathyroidism   . Hypertension   . Dyslipidemia   . OA (osteoarthritis)   . Metatarsalgia   . Hyperlipidemia     statin intolerant  . GERD (gastroesophageal reflux disease)   . Coronary artery disease 2006    stents x2=LAD LCX; totally occluded RCA  . Seasonal allergies   . Insomnia   . Peripheral vascular disease     Past Surgical History  Procedure Laterality Date  . Cervical spine surgery  2004  . Cardiac catheterization  10/28/2004    circ and mid LAD chyper stents  . Cervical fusion  [id number]  . Incision and drainage abscess posterior cervicalspine  2009  . Elbow arthroplasty  1994    right  . Colonoscopy    . Shoulder open rotator cuff repair Right 04/23/2013    Procedure: RIGHT TWO TENDON ROTATOR CUFF REPAIR   SUBACROMIAL DECOMPRESSION AND OPEN DISTAL CLAVICLE RESECTION;  Surgeon: Wyn Forster., MD;  Location: [facility];  Service: Orthopedics;  Laterality: Right;    Social History Social History  Substance Use Topics  . Smoking status: Former Smoker    Types: Cigarettes    Quit date: 05/30/1989  . Smokeless tobacco: Never Used  . Alcohol Use: Yes     Comment: one drink a month    Family History Family History  Problem Relation Age of Onset  . Arthritis Mother   . Varicose Veins Mother   . Lung cancer Father   . COPD Father     Lung  . Cancer Brother     Lukemia  . COPD Brother   . Cancer Brother     Prostate    Allergies  Allergies  Allergen Reactions  . Atorvastatin     Muscle aches  . Codeine Itching  . Crestor [Rosuvastatin Calcium]     Muscle aches     Current Outpatient Prescriptions  Medication Sig Dispense Refill  . acetaminophen (TYLENOL ARTHRITIS PAIN) 650 MG CR tablet 2 (two) times daily. Take 1 tab twice a day    . aspirin 81 MG tablet Take 1 tablet (81 mg total) by mouth daily. 30 tablet 6  . calcitRIOL (ROCALTROL) 0.5 MCG capsule Take 0.5 mcg by mouth daily.    . calcium-vitamin D (OSCAL WITH D) 500-200 MG-UNIT per tablet  Take 2 tablets by mouth. Take 2 tabs Am and 1 tab Pm    . ezetimibe (ZETIA) 10 MG tablet Take 10 mg by mouth daily.      . fenofibrate 160 MG tablet Take 160 mg by mouth daily.    . fish oil-omega-3 fatty acids 1000 MG capsule Take 4,800 mg by mouth daily. Take 2 tabs. Am and 2 tabs Pm    . glucosamine-chondroitin 500-400 MG tablet Take 1 tablet by mouth. 2 Tabs twice daily    . losartan (COZAAR) 100 MG tablet daily.    . ranitidine (ZANTAC) 150 MG tablet Take 150 mg by mouth 2 (two) times daily.    . traZODone (DESYREL) 50 MG tablet Take 50 mg by mouth at bedtime.    . fexofenadine (ALLEGRA) 180 MG tablet Take 180 mg by mouth daily.    Marland Kitchen losartan (COZAAR) 50 MG tablet Take 100 mg by mouth daily.     .  nitroGLYCERIN (NITROSTAT) 0.4 MG SL tablet Place 1 tablet (0.4 mg total) under the tongue every 5 (five) minutes as needed for chest pain. 25 tablet 12   No current facility-administered medications for this visit.    ROS:   General:  No weight loss, Fever, chills  HEENT: No recent headaches, no nasal bleeding, no visual changes, no sore throat  Neurologic: No dizziness, blackouts, seizures. No recent symptoms of stroke or mini- stroke. No recent episodes of slurred speech, or temporary blindness.  Cardiac: No recent episodes of chest pain/pressure, no shortness of breath at rest.  No shortness of breath with exertion.  Denies history of atrial fibrillation or irregular heartbeat  Vascular: No history of rest pain in feet.  + history of claudication.  No history of non-healing ulcer, No history of DVT   Pulmonary: No home oxygen, no productive cough, no hemoptysis,  No asthma or wheezing  Musculoskeletal:  [x ] Arthritis, [x ] Low back pain,  [ ]  Joint pain  Hematologic:No history of hypercoagulable state.  No history of easy bleeding.  No history of anemia  Gastrointestinal: No hematochezia or melena,  No gastroesophageal reflux, no trouble swallowing  Urinary: [x ] chronic Kidney disease, [ ]  on HD - [ ]  MWF or [ ]  TTHS, [ ]  Burning with urination, [ ]  Frequent urination, [ ]  Difficulty urinating;   Skin: No rashes  Psychological: No history of anxiety,  No history of depression   Physical Examination  Filed Vitals:   01/08/15 0939 01/08/15 0952  BP: 151/63 150/66  Pulse: 54 55  Temp: 97.6 F (36.4 C)   TempSrc: Oral   Resp: 14   Height: 6\' 2"  (1.88 m)   Weight: 209 lb (94.802 kg)   SpO2: 99%     Body mass index is 26.82 kg/(m^2).  General:  Alert and oriented, no acute distress HEENT: Normal Neck: No bruit or JVD Pulmonary: Clear to auscultation bilaterally Cardiac: Regular Rate and Rhythm without murmur Abdomen: Soft, non-tender, non-distended, no mass, no  scars Skin: No rash Extremity Pulses:  2+ radial, brachial, femoral, absent popliteal dorsalis pedis, posterior tibial pulses bilaterally Musculoskeletal: No deformity or edema  Neurologic: Upper and lower extremity motor 5/5 and symmetric  DATA:  Patient had bilateral ABIs performed which were 0.79 on the right 0.62 on the left these were [facility] imaging on July 26. 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 Most likely the pain in his feet and numbness in his thigh or unrelated problems related to degenerative joint disease   PLAN:  Had a lengthy discussion with the patient today regarding the natural history of peripheral arterial disease. Told him that his lifetime risk of amputation is less than 5% as long as he continues to refrain from smoking. I discussed with him today the possibility of conservative management with a walking program of daily walks of 30 minutes to try to increase collateralization and muscle tolerance. However he states that basically he is artery doing this at work and he feels like his quality of life is severely diminished in his current status. He has opted for an arteriogram lower extremity runoff possible intervention. Risks benefits possible complications and procedure details were explained to the patient today. He understands and agrees to proceed. This is scheduled for Friday, August 19 thousand 16.  Fabienne Bruns, MD [facility] [place] Office: [phone] Pager: [phone]

## 2015-01-08 NOTE — Progress Notes (Signed)
Filed Vitals:   01/08/15 0939 01/08/15 0952  BP: 151/63 150/66  Pulse: 54 55  Temp: 97.6 F (36.4 C)   TempSrc: Oral   Resp: 14   Height:  (1.88 m)   Weight: 209 lb (94.802 kg)   SpO2: 99%

## 2015-01-16 ENCOUNTER — Encounter (HOSPITAL_COMMUNITY): Payer: Self-pay | Admitting: Vascular Surgery

## 2015-01-16 ENCOUNTER — Ambulatory Visit (HOSPITAL_COMMUNITY)
Admission: RE | Admit: 2015-01-16 | Discharge: 2015-01-16 | Disposition: A | Discharge status: Home / Self Care | Payer: BLUE CROSS/BLUE SHIELD | Source: Ambulatory Visit | Attending: Vascular Surgery | Admitting: Vascular Surgery

## 2015-01-16 ENCOUNTER — Other Ambulatory Visit: Payer: Self-pay | Admitting: *Deleted

## 2015-01-16 ENCOUNTER — Encounter (HOSPITAL_COMMUNITY)
Admission: RE | Disposition: A | Discharge status: Home / Self Care | Payer: Self-pay | Source: Ambulatory Visit | Attending: Vascular Surgery

## 2015-01-16 DIAGNOSIS — Z955 Presence of coronary angioplasty implant and graft: Secondary | ICD-10-CM | POA: Diagnosis not present

## 2015-01-16 DIAGNOSIS — I70213 Atherosclerosis of native arteries of extremities with intermittent claudication, bilateral legs: Secondary | ICD-10-CM

## 2015-01-16 DIAGNOSIS — I739 Peripheral vascular disease, unspecified: Secondary | ICD-10-CM

## 2015-01-16 DIAGNOSIS — Z95828 Presence of other vascular implants and grafts: Secondary | ICD-10-CM

## 2015-01-16 DIAGNOSIS — Z79899 Other long term (current) drug therapy: Secondary | ICD-10-CM | POA: Diagnosis not present

## 2015-01-16 DIAGNOSIS — Z7982 Long term (current) use of aspirin: Secondary | ICD-10-CM | POA: Diagnosis not present

## 2015-01-16 DIAGNOSIS — E785 Hyperlipidemia, unspecified: Secondary | ICD-10-CM | POA: Diagnosis not present

## 2015-01-16 DIAGNOSIS — Z87891 Personal history of nicotine dependence: Secondary | ICD-10-CM | POA: Diagnosis not present

## 2015-01-16 DIAGNOSIS — I2510 Atherosclerotic heart disease of native coronary artery without angina pectoris: Secondary | ICD-10-CM | POA: Insufficient documentation

## 2015-01-16 DIAGNOSIS — I10 Essential (primary) hypertension: Secondary | ICD-10-CM | POA: Diagnosis not present

## 2015-01-16 DIAGNOSIS — K219 Gastro-esophageal reflux disease without esophagitis: Secondary | ICD-10-CM | POA: Diagnosis not present

## 2015-01-16 DIAGNOSIS — Z9862 Peripheral vascular angioplasty status: Secondary | ICD-10-CM

## 2015-01-16 DIAGNOSIS — M1990 Unspecified osteoarthritis, unspecified site: Secondary | ICD-10-CM | POA: Insufficient documentation

## 2015-01-16 HISTORY — PX: PERIPHERAL VASCULAR CATHETERIZATION: SHX172C

## 2015-01-16 LAB — POCT I-STAT, CHEM 8
BUN: 21 mg/dL — ABNORMAL HIGH (ref 6–20)
Calcium, Ion: 1.08 mmol/L — ABNORMAL LOW (ref 1.13–1.30)
Chloride: 106 mmol/L (ref 101–111)
Creatinine, Ser: 1.2 mg/dL (ref 0.61–1.24)
Glucose, Bld: 94 mg/dL (ref 65–99)
HCT: 39 % (ref 39.0–52.0)
Hemoglobin: 13.3 g/dL (ref 13.0–17.0)
Potassium: 3.9 mmol/L (ref 3.5–5.1)
Sodium: 140 mmol/L (ref 135–145)
TCO2: 22 mmol/L (ref 0–100)

## 2015-01-16 LAB — POCT ACTIVATED CLOTTING TIME: Activated Clotting Time: 171 seconds

## 2015-01-16 SURGERY — ABDOMINAL AORTOGRAM
Anesthesia: LOCAL

## 2015-01-16 MED ORDER — LIDOCAINE HCL (PF) 1 % IJ SOLN
INTRAMUSCULAR | Status: AC
  Filled 2015-01-16: qty 30

## 2015-01-16 MED ORDER — MORPHINE SULFATE (PF) 10 MG/ML IV SOLN: 2.0000 mg | INTRAVENOUS | Status: DC | PRN

## 2015-01-16 MED ORDER — HYDRALAZINE HCL 20 MG/ML IJ SOLN: 5.0000 mg | INTRAMUSCULAR | Status: DC | PRN

## 2015-01-16 MED ORDER — CLOPIDOGREL BISULFATE 300 MG PO TABS
300.0000 mg | ORAL_TABLET | Freq: Once | ORAL | Status: AC
  Administered 2015-01-16: 300 mg via ORAL

## 2015-01-16 MED ORDER — METOPROLOL TARTRATE 1 MG/ML IV SOLN: 2.0000 mg | INTRAVENOUS | Status: DC | PRN

## 2015-01-16 MED ORDER — OXYCODONE HCL 5 MG PO TABS
ORAL_TABLET | ORAL | Status: AC
  Filled 2015-01-16: qty 1

## 2015-01-16 MED ORDER — HEPARIN SODIUM (PORCINE) 1000 UNIT/ML IJ SOLN
INTRAMUSCULAR | Status: DC | PRN
  Administered 2015-01-16: 5000 [IU] via INTRAVENOUS

## 2015-01-16 MED ORDER — SODIUM CHLORIDE 0.9 % IV SOLN
INTRAVENOUS | Status: DC
  Administered 2015-01-16: 08:00:00 via INTRAVENOUS

## 2015-01-16 MED ORDER — CLOPIDOGREL BISULFATE 75 MG PO TABS: 75.0000 mg | ORAL_TABLET | Freq: Every day | ORAL | Status: DC

## 2015-01-16 MED ORDER — OXYCODONE HCL 5 MG PO TABS
5.0000 mg | ORAL_TABLET | ORAL | Status: DC | PRN
  Administered 2015-01-16: 5 mg via ORAL

## 2015-01-16 MED ORDER — LABETALOL HCL 5 MG/ML IV SOLN: 10.0000 mg | INTRAVENOUS | Status: DC | PRN

## 2015-01-16 MED ORDER — CLOPIDOGREL BISULFATE 300 MG PO TABS
ORAL_TABLET | ORAL | Status: AC
  Filled 2015-01-16: qty 1

## 2015-01-16 MED ORDER — HEPARIN (PORCINE) IN NACL 2-0.9 UNIT/ML-% IJ SOLN
INTRAMUSCULAR | Status: DC | PRN
  Administered 2015-01-16: 10:00:00

## 2015-01-16 MED ORDER — HEPARIN SODIUM (PORCINE) 1000 UNIT/ML IJ SOLN
INTRAMUSCULAR | Status: AC
  Filled 2015-01-16: qty 1

## 2015-01-16 MED ORDER — HEPARIN (PORCINE) IN NACL 2-0.9 UNIT/ML-% IJ SOLN
INTRAMUSCULAR | Status: AC
  Filled 2015-01-16: qty 1000

## 2015-01-16 MED ORDER — IODIXANOL 320 MG/ML IV SOLN
INTRAVENOUS | Status: DC | PRN
  Administered 2015-01-16: 150 mL via INTRA_ARTERIAL

## 2015-01-16 MED ORDER — ACETAMINOPHEN 325 MG PO TABS: 325.0000 mg | ORAL_TABLET | ORAL | Status: DC | PRN

## 2015-01-16 MED ORDER — ACETAMINOPHEN 325 MG RE SUPP: 325.0000 mg | RECTAL | Status: DC | PRN

## 2015-01-16 MED ORDER — SODIUM CHLORIDE 0.45 % IV SOLN
INTRAVENOUS | Status: DC
  Administered 2015-01-16: 11:00:00 via INTRAVENOUS

## 2015-01-16 MED ORDER — ONDANSETRON HCL 4 MG/2ML IJ SOLN: 4.0000 mg | Freq: Four times a day (QID) | INTRAMUSCULAR | Status: DC | PRN

## 2015-01-16 SURGICAL SUPPLY — 14 items
BALLN ARMADA 10X20X80 (BALLOONS) ×2
BALLOON ARMADA 10X20X80 (BALLOONS) IMPLANT
CATH ANGIO 5F PIGTAIL 65CM (CATHETERS) ×1 IMPLANT
COVER PRB 48X5XTLSCP FOLD TPE (BAG) IMPLANT
COVER PROBE 5X48 (BAG) ×2
KIT ENCORE 26 ADVANTAGE (KITS) ×2 IMPLANT
KIT PV (KITS) ×2 IMPLANT
SHEATH BRITE TIP 7FR 35CM (SHEATH) ×1 IMPLANT
SHEATH PINNACLE 5F 10CM (SHEATH) ×1 IMPLANT
STENT OMNILINK ELITE 9X29X80 (Permanent Stent) ×2 IMPLANT
SYR MEDRAD MARK V 150ML (SYRINGE) ×2 IMPLANT
TRANSDUCER W/STOPCOCK (MISCELLANEOUS) ×2 IMPLANT
TRAY PV CATH (CUSTOM PROCEDURE TRAY) ×2 IMPLANT
WIRE HITORQ VERSACORE ST 145CM (WIRE) ×2 IMPLANT

## 2015-01-16 NOTE — Discharge Instructions (Signed)
No heavy lifting for 2 weeks.  Nothing heavier than 5 lbs Angiogram, Care After Refer to this sheet in the next few weeks. These instructions provide you with information on caring for yourself after your procedure. Your health care provider may also give you more specific instructions. Your treatment has been planned according to current medical practices, but problems sometimes occur. Call your health care provider if you have any problems or questions after your procedure.  WHAT TO EXPECT AFTER THE PROCEDURE After your procedure, it is typical to have the following sensations:  Minor discomfort or tenderness and a small bump at the catheter insertion site. The bump should usually decrease in size and tenderness within 1 to 2 weeks.  Any bruising will usually fade within 2 to 4 weeks. HOME CARE INSTRUCTIONS   You may need to keep taking blood thinners if they were prescribed for you. Take medicines only as directed by your health care provider.  Do not apply powder or lotion to the site.  Do not take baths, swim, or use a hot tub until your health care provider approves.  You may shower 24 hours after the procedure. Remove the bandage (dressing) and gently wash the site with plain soap and water. Gently pat the site dry.  Inspect the site at least twice daily.  Limit your activity for the first 48 hours. Do not bend, squat, or lift anything over 20 lb (9 kg) or as directed by your health care provider.  Plan to have someone take you home after the procedure. Follow instructions about when you can drive or return to work. SEEK MEDICAL CARE IF:  You get light-headed when standing up.  You have drainage (other than a small amount of blood on the dressing).  You have chills.  You have a fever.  You have redness, warmth, swelling, or pain at the insertion site. SEEK IMMEDIATE MEDICAL CARE IF:   You develop chest pain or shortness of breath, feel faint, or pass out.  You have  bleeding, swelling larger than a walnut, or drainage from the catheter insertion site.  You develop pain, discoloration, coldness, or severe bruising in the leg or arm that held the catheter.  You develop bleeding from any other place, such as the bowels. You may see bright red blood in your urine or stools, or your stools may appear black and tarry.  You have heavy bleeding from the site. If this happens, hold pressure on the site. MAKE SURE YOU:  Understand these instructions.  Will watch your condition.  Will get help right away if you are not doing well or get worse. Document Released: 12/02/2004 Document Revised: 09/30/2013 Document Reviewed: 10/08/2012 [facility] Patient Information 2015 [place], [place]. This information is not intended to replace advice given to you by your health care provider. Make sure you discuss any questions you have with your health care provider.

## 2015-01-16 NOTE — Interval H&P Note (Signed)
History and Physical Interval Note:  01/16/2015 10:59 AM  Derrick [name]  has presented today for surgery, with the diagnosis of pvd  The various methods of treatment have been discussed with the patient and family. After consideration of risks, benefits and other options for treatment, the patient has consented to  Procedure(s): Abdominal Aortogram (N/A) as a surgical intervention .  The patient's history has been reviewed, patient examined, no change in status, stable for surgery.  I have reviewed the patient's chart and labs.  Questions were answered to the patient's satisfaction.     Fabienne Bruns

## 2015-01-16 NOTE — Progress Notes (Signed)
Ambulated patient, he noted left groin discomfort. Pt was laid back into bed to be assessed. Small hematoma noted, pressure held 20 minutes. Post hold pt had Palpable left dp +1, groin is level 0, Dr Darrick Penna was paged.

## 2015-01-16 NOTE — Progress Notes (Signed)
Site area: lt groin fa sheath Site Prior to Removal:  Level  0 Pressure Applied For:  20 minutes Manual:   yes Patient Status During Pull:  stable Post Pull Site:  Level  0 Post Pull Instructions Given:  yes Post Pull Pulses Present: yes Dressing Applied:  tegaderm Bedrest begins @ 1120 Comments:  0

## 2015-01-16 NOTE — Progress Notes (Signed)
Dr. Edilia Bo notified of hematoma. Orders received. Report given to [facility].N.

## 2015-01-16 NOTE — Progress Notes (Signed)
Pt up to walk in hall. Small quarter size hematoma noted (L) top thigh which pt states is painful 7/10.  Pressure held x Dr. Darrick Penna paged

## 2015-01-16 NOTE — Progress Notes (Signed)
Site area: rt groin fa sheath Site Prior to Removal:  Level 0 Pressure Applied For:  20 minutes Manual:   yes Patient Status During Pull:  stable Post Pull Site:  Level  0 Post Pull Instructions Given:  yes Post Pull Pulses Present: yes Dressing Applied:  tegaderm Bedrest begins @  Comments:   

## 2015-01-16 NOTE — H&P (View-Only) (Signed)
[facility] [place] HISTORY AND PHYSICAL   History of Present Illness:  Patient is a 59 y.o. year old male who presents for evaluation of bilateral leg pain. The patient complains of a burning sensation on the right anterior thigh in the dorsum of both feet. This occurs intermittently. He also complains of pain in both calves left greater than right after walking 100 feet. The pain in the calf resolves fairly quickly by resting. It returns pretty consistently.  He denies rest pain. He denies any nonhealing wounds. He is a former smoker quitting in 1991. The problem has been slowly progressive over the last 2 years. He is having difficulty at work because he can't get around very well.  He has also had an MRI of the lumbar spine which showed some degenerative joint disease..  Other medical problems include coronary artery disease, hyperlipidemia (intolerant to statins), degenerative joint disease all of which are currently stable. He did have an episode of renal insufficiency in the past and was documented as CK D3. The patient states this was related to meloxicam and his kidney function is now normal.  Past Medical History  Diagnosis Date  . Hyperparathyroidism   . Hypertension   . Dyslipidemia   . OA (osteoarthritis)   . Metatarsalgia   . Hyperlipidemia     statin intolerant  . GERD (gastroesophageal reflux disease)   . Coronary artery disease 2006    stents x2=LAD LCX; totally occluded RCA  . Seasonal allergies   . Insomnia   . Peripheral vascular disease     Past Surgical History  Procedure Laterality Date  . Cervical spine surgery  2004  . Cardiac catheterization  10/28/2004    circ and mid LAD chyper stents  . Cervical fusion  [id number]  . Incision and drainage abscess posterior cervicalspine  2009  . Elbow arthroplasty  1994    right  . Colonoscopy    . Shoulder open rotator cuff repair Right 04/23/2013    Procedure: RIGHT TWO TENDON ROTATOR CUFF REPAIR   SUBACROMIAL DECOMPRESSION AND OPEN DISTAL CLAVICLE RESECTION;  Surgeon: Wyn Forster., MD;  Location: [facility];  Service: Orthopedics;  Laterality: Right;    Social History Social History  Substance Use Topics  . Smoking status: Former Smoker    Types: Cigarettes    Quit date: 05/30/1989  . Smokeless tobacco: Never Used  . Alcohol Use: Yes     Comment: one drink a month    Family History Family History  Problem Relation Age of Onset  . Arthritis Mother   . Varicose Veins Mother   . Lung cancer Father   . COPD Father     Lung  . Cancer Brother     Lukemia  . COPD Brother   . Cancer Brother     Prostate    Allergies  Allergies  Allergen Reactions  . Atorvastatin     Muscle aches  . Codeine Itching  . Crestor [Rosuvastatin Calcium]     Muscle aches     Current Outpatient Prescriptions  Medication Sig Dispense Refill  . acetaminophen (TYLENOL ARTHRITIS PAIN) 650 MG CR tablet 2 (two) times daily. Take 1 tab twice a day    . aspirin 81 MG tablet Take 1 tablet (81 mg total) by mouth daily. 30 tablet 6  . calcitRIOL (ROCALTROL) 0.5 MCG capsule Take 0.5 mcg by mouth daily.    . calcium-vitamin D (OSCAL WITH D) 500-200 MG-UNIT per tablet  Take 2 tablets by mouth. Take 2 tabs Am and 1 tab Pm    . ezetimibe (ZETIA) 10 MG tablet Take 10 mg by mouth daily.      . fenofibrate 160 MG tablet Take 160 mg by mouth daily.    . fish oil-omega-3 fatty acids 1000 MG capsule Take 4,800 mg by mouth daily. Take 2 tabs. Am and 2 tabs Pm    . glucosamine-chondroitin 500-400 MG tablet Take 1 tablet by mouth. 2 Tabs twice daily    . losartan (COZAAR) 100 MG tablet daily.    . ranitidine (ZANTAC) 150 MG tablet Take 150 mg by mouth 2 (two) times daily.    . traZODone (DESYREL) 50 MG tablet Take 50 mg by mouth at bedtime.    . fexofenadine (ALLEGRA) 180 MG tablet Take 180 mg by mouth daily.    Marland Kitchen losartan (COZAAR) 50 MG tablet Take 100 mg by mouth daily.     .  nitroGLYCERIN (NITROSTAT) 0.4 MG SL tablet Place 1 tablet (0.4 mg total) under the tongue every 5 (five) minutes as needed for chest pain. 25 tablet 12   No current facility-administered medications for this visit.    ROS:   General:  No weight loss, Fever, chills  HEENT: No recent headaches, no nasal bleeding, no visual changes, no sore throat  Neurologic: No dizziness, blackouts, seizures. No recent symptoms of stroke or mini- stroke. No recent episodes of slurred speech, or temporary blindness.  Cardiac: No recent episodes of chest pain/pressure, no shortness of breath at rest.  No shortness of breath with exertion.  Denies history of atrial fibrillation or irregular heartbeat  Vascular: No history of rest pain in feet.  + history of claudication.  No history of non-healing ulcer, No history of DVT   Pulmonary: No home oxygen, no productive cough, no hemoptysis,  No asthma or wheezing  Musculoskeletal:  [x ] Arthritis, [x ] Low back pain,  [ ]  Joint pain  Hematologic:No history of hypercoagulable state.  No history of easy bleeding.  No history of anemia  Gastrointestinal: No hematochezia or melena,  No gastroesophageal reflux, no trouble swallowing  Urinary: [x ] chronic Kidney disease, [ ]  on HD - [ ]  MWF or [ ]  TTHS, [ ]  Burning with urination, [ ]  Frequent urination, [ ]  Difficulty urinating;   Skin: No rashes  Psychological: No history of anxiety,  No history of depression   Physical Examination  Filed Vitals:   01/08/15 0939 01/08/15 0952  BP: 151/63 150/66  Pulse: 54 55  Temp: 97.6 F (36.4 C)   TempSrc: Oral   Resp: 14   Height: 6\' 2"  (1.88 m)   Weight: 209 lb (94.802 kg)   SpO2: 99%     Body mass index is 26.82 kg/(m^2).  General:  Alert and oriented, no acute distress HEENT: Normal Neck: No bruit or JVD Pulmonary: Clear to auscultation bilaterally Cardiac: Regular Rate and Rhythm without murmur Abdomen: Soft, non-tender, non-distended, no mass, no  scars Skin: No rash Extremity Pulses:  2+ radial, brachial, femoral, absent popliteal dorsalis pedis, posterior tibial pulses bilaterally Musculoskeletal: No deformity or edema  Neurologic: Upper and lower extremity motor 5/5 and symmetric  DATA:  Patient had bilateral ABIs performed which were 0.79 on the right 0.62 on the left these were [facility] imaging on July 26. 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 Most likely the pain in his feet and numbness in his thigh or unrelated problems related to degenerative joint disease   PLAN:  Had a lengthy discussion with the patient today regarding the natural history of peripheral arterial disease. Told him that his lifetime risk of amputation is less than 5% as long as he continues to refrain from smoking. I discussed with him today the possibility of conservative management with a walking program of daily walks of 30 minutes to try to increase collateralization and muscle tolerance. However he states that basically he is artery doing this at work and he feels like his quality of life is severely diminished in his current status. He has opted for an arteriogram lower extremity runoff possible intervention. Risks benefits possible complications and procedure details were explained to the patient today. He understands and agrees to proceed. This is scheduled for Friday, August 19 thousand 16.  Fabienne Bruns, MD [facility] [place] Office: [phone] Pager: [phone]

## 2015-01-17 ENCOUNTER — Emergency Department (HOSPITAL_COMMUNITY)
Admission: EM | Admit: 2015-01-17 | Discharge: 2015-01-17 | Disposition: A | Discharge status: Home / Self Care | Payer: BLUE CROSS/BLUE SHIELD | Attending: Emergency Medicine | Admitting: Emergency Medicine

## 2015-01-17 ENCOUNTER — Encounter (HOSPITAL_COMMUNITY): Payer: Self-pay

## 2015-01-17 ENCOUNTER — Emergency Department (HOSPITAL_BASED_OUTPATIENT_CLINIC_OR_DEPARTMENT_OTHER)
Admit: 2015-01-17 | Discharge: 2015-01-17 | Disposition: A | Discharge status: Home / Self Care | Payer: BLUE CROSS/BLUE SHIELD

## 2015-01-17 ENCOUNTER — Encounter (HOSPITAL_COMMUNITY): Payer: Self-pay | Admitting: *Deleted

## 2015-01-17 DIAGNOSIS — E213 Hyperparathyroidism, unspecified: Secondary | ICD-10-CM | POA: Insufficient documentation

## 2015-01-17 DIAGNOSIS — Z87891 Personal history of nicotine dependence: Secondary | ICD-10-CM | POA: Insufficient documentation

## 2015-01-17 DIAGNOSIS — E785 Hyperlipidemia, unspecified: Secondary | ICD-10-CM | POA: Insufficient documentation

## 2015-01-17 DIAGNOSIS — M1990 Unspecified osteoarthritis, unspecified site: Secondary | ICD-10-CM | POA: Insufficient documentation

## 2015-01-17 DIAGNOSIS — Z7982 Long term (current) use of aspirin: Secondary | ICD-10-CM | POA: Diagnosis not present

## 2015-01-17 DIAGNOSIS — R1030 Lower abdominal pain, unspecified: Secondary | ICD-10-CM

## 2015-01-17 DIAGNOSIS — T148 Other injury of unspecified body region: Secondary | ICD-10-CM | POA: Insufficient documentation

## 2015-01-17 DIAGNOSIS — Z79899 Other long term (current) drug therapy: Secondary | ICD-10-CM | POA: Diagnosis not present

## 2015-01-17 DIAGNOSIS — I729 Aneurysm of unspecified site: Secondary | ICD-10-CM | POA: Diagnosis not present

## 2015-01-17 DIAGNOSIS — G4700 Insomnia, unspecified: Secondary | ICD-10-CM | POA: Insufficient documentation

## 2015-01-17 DIAGNOSIS — K219 Gastro-esophageal reflux disease without esophagitis: Secondary | ICD-10-CM | POA: Insufficient documentation

## 2015-01-17 DIAGNOSIS — I2510 Atherosclerotic heart disease of native coronary artery without angina pectoris: Secondary | ICD-10-CM | POA: Insufficient documentation

## 2015-01-17 DIAGNOSIS — Z9889 Other specified postprocedural states: Secondary | ICD-10-CM | POA: Diagnosis not present

## 2015-01-17 DIAGNOSIS — R2240 Localized swelling, mass and lump, unspecified lower limb: Secondary | ICD-10-CM | POA: Insufficient documentation

## 2015-01-17 LAB — CBC WITH DIFFERENTIAL/PLATELET
Basophils Absolute: 0.1 10*3/uL (ref 0.0–0.1)
Basophils Relative: 1 % (ref 0–1)
Eosinophils Absolute: 0.2 10*3/uL (ref 0.0–0.7)
Eosinophils Relative: 2 % (ref 0–5)
HCT: 36.8 % — ABNORMAL LOW (ref 39.0–52.0)
Hemoglobin: 12.5 g/dL — ABNORMAL LOW (ref 13.0–17.0)
Lymphocytes Relative: 15 % (ref 12–46)
Lymphs Abs: 1.4 10*3/uL (ref 0.7–4.0)
MCH: 29.7 pg (ref 26.0–34.0)
MCHC: 34 g/dL (ref 30.0–36.0)
MCV: 87.4 fL (ref 78.0–100.0)
Monocytes Absolute: 0.9 10*3/uL (ref 0.1–1.0)
Monocytes Relative: 10 % (ref 3–12)
Neutro Abs: 6.7 10*3/uL (ref 1.7–7.7)
Neutrophils Relative %: 72 % (ref 43–77)
Platelets: 293 10*3/uL (ref 150–400)
RBC: 4.21 MIL/uL — ABNORMAL LOW (ref 4.22–5.81)
RDW: 13 % (ref 11.5–15.5)
WBC: 9.2 10*3/uL (ref 4.0–10.5)

## 2015-01-17 LAB — BASIC METABOLIC PANEL
Anion gap: 9 (ref 5–15)
BUN: 17 mg/dL (ref 6–20)
CO2: 27 mmol/L (ref 22–32)
Calcium: 9.6 mg/dL (ref 8.9–10.3)
Chloride: 102 mmol/L (ref 101–111)
Creatinine, Ser: 1.42 mg/dL — ABNORMAL HIGH (ref 0.61–1.24)
GFR calc Af Amer: >60 mL/min (ref >60)
GFR calc non Af Amer: 52 mL/min — ABNORMAL LOW (ref >60)
Glucose, Bld: 99 mg/dL (ref 65–99)
Potassium: 3.9 mmol/L (ref 3.5–5.1)
Sodium: 138 mmol/L (ref 135–145)

## 2015-01-17 LAB — PROTIME-INR
INR: 1.07 (ref 0.00–1.49)
Prothrombin Time: 14.1 seconds (ref 11.6–15.2)

## 2015-01-17 MED ORDER — OXYCODONE-ACETAMINOPHEN 5-325 MG PO TABS: 1.0000 | ORAL_TABLET | ORAL | Status: DC | PRN

## 2015-01-17 MED ORDER — HYDROCODONE-ACETAMINOPHEN 5-325 MG PO TABS
1.0000 | ORAL_TABLET | Freq: Once | ORAL | Status: AC
  Administered 2015-01-17: 1 via ORAL
  Filled 2015-01-17: qty 1

## 2015-01-17 NOTE — ED Notes (Signed)
Pt here for swelling today to L groin below incision site, after vascular procedure yesterday.  No external bleeding, but swelling noted to L groin.  Pt states same yesterday before he was discharged.

## 2015-01-17 NOTE — Consult Note (Signed)
[facility] [place]  Patient name: Derrick [name] MRN: [id number] DOB: December 06, 1955 Sex: male  REASON FOR CONSULT: swelling in left groin area and consult from the emergency department.  HPI: Derrick [name] is a 59 y.o. male who underwent bilateral iliac PTAs yesterday by Dr. Fabienne Bruns. He had 9 x 29 mm stents placed on both sides via 7 French sheaths. Patient had some slight bleeding prior to discharge yesterday and therefore pressure was held and the swelling in the left groin resolved. He was kept for an additional 2 hours before discharge. Today his wife was concerned that he had more swelling in the left groin and therefore she brought him to the emergency department. The patient denies any significant pain in the left groin.  Past Medical History  Diagnosis Date  . Hyperparathyroidism   . Hypertension   . Dyslipidemia   . OA (osteoarthritis)   . Metatarsalgia   . Hyperlipidemia     statin intolerant  . GERD (gastroesophageal reflux disease)   . Coronary artery disease 2006    stents x2=LAD LCX; totally occluded RCA  . Seasonal allergies   . Insomnia   . Peripheral vascular disease     Family History  Problem Relation Age of Onset  . Arthritis Mother   . Varicose Veins Mother   . Lung cancer Father   . COPD Father     Lung  . Cancer Brother     Lukemia  . COPD Brother   . Cancer Brother     Prostate    SOCIAL HISTORY: Social History  Substance Use Topics  . Smoking status: Former Smoker    Types: Cigarettes    Quit date: 05/30/1989  . Smokeless tobacco: Never Used  . Alcohol Use: Yes     Comment: one drink a month    Allergies  Allergen Reactions  . Atorvastatin     Muscle aches  . Codeine Itching  . Crestor [Rosuvastatin Calcium]     Muscle aches  . Meloxicam Other (See Comments)    Can not take due to kidneys  . Statins Other (See Comments)    myalgia    No current facility-administered medications for this  encounter.   Current Outpatient Prescriptions  Medication Sig Dispense Refill  . acetaminophen (TYLENOL ARTHRITIS PAIN) 650 MG CR tablet Take 1,300 mg by mouth 2 (two) times daily.     Marland Kitchen aspirin 81 MG tablet Take 1 tablet (81 mg total) by mouth daily. 30 tablet 6  . calcitRIOL (ROCALTROL) 0.5 MCG capsule Take 0.5 mcg by mouth daily.    . calcium-vitamin D (OSCAL WITH D) 500-200 MG-UNIT per tablet Take 2 tablets by mouth. Take 2 tabs Am and 1 tab Pm    . ezetimibe (ZETIA) 10 MG tablet Take 10 mg by mouth daily.      . fenofibrate 160 MG tablet Take 160 mg by mouth daily.    . fish oil-omega-3 fatty acids 1000 MG capsule Take 4,800 mg by mouth daily. Take 2 tabs. Am and 2 tabs Pm    . glucosamine-chondroitin 500-400 MG tablet Take 1 tablet by mouth 2 (two) times daily.     Marland Kitchen losartan (COZAAR) 100 MG tablet Take 100 mg by mouth daily.     . nitroGLYCERIN (NITROSTAT) 0.4 MG SL tablet Place 1 tablet (0.4 mg total) under the tongue every 5 (five) minutes as needed for chest pain. 25 tablet 12  . ranitidine (ZANTAC) 150 MG tablet  Take 150 mg by mouth 2 (two) times daily.    . traZODone (DESYREL) 50 MG tablet Take 50 mg by mouth at bedtime.    . clopidogrel (PLAVIX) 75 MG tablet Take 1 tablet (75 mg total) by mouth daily. 30 tablet 11  . oxyCODONE-acetaminophen (PERCOCET/ROXICET) 5-325 MG per tablet Take 1 tablet by mouth every 4 (four) hours as needed for severe pain. 10 tablet 0    REVIEW OF SYSTEMS: Arly.Keller ] denotes positive finding; [  ] denotes negative finding CARDIOVASCULAR:  [ ]  chest pain   [ ]  chest pressure   [ ]  palpitations   [ ]  orthopnea   [ ]  dyspnea on exertion   [ ]  claudication   [ ]  rest pain   [ ]  DVT   [ ]  phlebitis PULMONARY:   [ ]  productive cough   [ ]  asthma   [ ]  wheezing NEUROLOGIC:   [ ]  weakness  [ ]  paresthesias  [ ]  aphasia  [ ]  amaurosis  [ ]  dizziness HEMATOLOGIC:   [ ]  bleeding problems   [ ]  clotting disorders MUSCULOSKELETAL:  [ ]  joint pain   [ ]  joint swelling [ ]   leg swelling GASTROINTESTINAL: [ ]   blood in stool  [ ]   hematemesis GENITOURINARY:  [ ]   dysuria  [ ]   hematuria PSYCHIATRIC:  [ ]  history of major depression INTEGUMENTARY:  [ ]  rashes  [ ]  ulcers CONSTITUTIONAL:  [ ]  fever   [ ]  chills  PHYSICAL EXAM: Filed Vitals:   01/17/15 1500 01/17/15 1515 01/17/15 1545 01/17/15 1552  BP: 146/66 144/73 135/65   Pulse: 58 58 54   Temp:    97.8 F (36.6 C)  TempSrc:    Oral  Resp:      Height:      Weight:      SpO2: 95% 96% 93%    Body mass index is 26.44 kg/(m^2). GENERAL: The patient is a well-nourished male, in no acute distress. The vital signs are documented above. CARDIAC: There is a regular rate and rhythm.  VASCULAR: both groins are soft and I do not appreciate a significant hematoma on either side. He has palpable femoral pulses. Both feet are warm and well-perfused. PULMONARY: There is good air exchange bilaterally without wheezing or rales. ABDOMEN: Soft and non-tender with normal pitched bowel sounds.  NEUROLOGIC: No focal weakness or paresthesias are detected.  DATA:  Lab Results  Component Value Date   WBC 9.2 01/17/2015   HGB 12.5* 01/17/2015   HCT 36.8* 01/17/2015   MCV 87.4 01/17/2015   PLT 293 01/17/2015   Lab Results  Component Value Date   NA 138 01/17/2015   K 3.9 01/17/2015   CL 102 01/17/2015   CO2 27 01/17/2015   Lab Results  Component Value Date   CREATININE 1.42* 01/17/2015   Lab Results  Component Value Date   INR 1.07 01/17/2015   INR 1.0 03/18/2008   INR 1.0 02/27/2008   No results found for: HGBA1C CBG (last 3)  No results for input(s): GLUCAP in the last 72 hours.   DUPLEX LEFT GROIN: Duplex of the left groin in the emergency department shows a small pseudoaneurysm which measures 38 mm x 9 mm.   MEDICAL ISSUES:  PSEUDOANEURYSM LEFT GROIN:  The patient has a small pseudoaneurysm in the left groin. I discussed the option of admitting the patient versus discharge with follow up next  week with Dr. Darrick Penna with a follow up duplex.  The patient and his wife are comfortable with discharge and I will arrange for this follow up visit and follow up duplex scan next week. They understand that if he hasn't any increased swelling or pain that he should return to the emergency room.  Waverly Ferrari [facility] [place] Beeper: [phone]

## 2015-01-17 NOTE — Discharge Instructions (Signed)
Follow up with Dr. Darrick Penna for a repeat ultrasound on 8/25.  Return to the ED if you develop worsening pain, swelling, bleeding or any other concerns.

## 2015-01-17 NOTE — ED Provider Notes (Signed)
CSN: [id number]     Arrival date & time 01/17/15  1200 History   First MD Initiated Contact with Patient 01/17/15 1308     Chief Complaint  Patient presents with  . Post-op Problem     (Consider location/radiation/quality/duration/timing/severity/associated sxs/prior Treatment) HPI Comments: Patient presents with pain and swelling in his left groin after having aortogram yesterday with femoral stent placements. Wife reports there is more swelling in the groin and there was only left the hospital yesterday. They report there was some "internal bleeding" after the procedure that required pressure held by the nurse. He was observed for 2 hours and then sent home. He is not anticoagulated other than aspirin. He is not yet started Plavix. He denies any chest pain or shortness of breath. He denies any dizziness or lightheadedness. He denies any nausea or vomiting. He denies any new numbness or tingling in the left leg  The history is provided by the patient.    Past Medical History  Diagnosis Date  . Hyperparathyroidism   . Hypertension   . Dyslipidemia   . OA (osteoarthritis)   . Metatarsalgia   . Hyperlipidemia     statin intolerant  . GERD (gastroesophageal reflux disease)   . Coronary artery disease 2006    stents x2=LAD LCX; totally occluded RCA  . Seasonal allergies   . Insomnia   . Peripheral vascular disease    Past Surgical History  Procedure Laterality Date  . Cervical spine surgery  2004  . Cardiac catheterization  10/28/2004    circ and mid LAD chyper stents  . Cervical fusion  [id number]  . Incision and drainage abscess posterior cervicalspine  2009  . Elbow arthroplasty  1994    right  . Colonoscopy    . Shoulder open rotator cuff repair Right 04/23/2013    Procedure: RIGHT TWO TENDON ROTATOR CUFF REPAIR  SUBACROMIAL DECOMPRESSION AND OPEN DISTAL CLAVICLE RESECTION;  Surgeon: Wyn Forster., MD;  Location: [facility];  Service: Orthopedics;   Laterality: Right;  . Peripheral vascular catheterization N/A 01/16/2015    Procedure: Abdominal Aortogram;  Surgeon: Sherren Kerns, MD;  Location: [facility] INVASIVE CV LAB;  Service: Cardiovascular;  Laterality: N/A;   Family History  Problem Relation Age of Onset  . Arthritis Mother   . Varicose Veins Mother   . Lung cancer Father   . COPD Father     Lung  . Cancer Brother     Lukemia  . COPD Brother   . Cancer Brother     Prostate   Social History  Substance Use Topics  . Smoking status: Former Smoker    Types: Cigarettes    Quit date: 05/30/1989  . Smokeless tobacco: Never Used  . Alcohol Use: Yes     Comment: one drink a month    Review of Systems  Constitutional: Negative for fever, activity change and appetite change.  HENT: Negative for congestion and rhinorrhea.   Respiratory: Negative for cough, chest tightness and shortness of breath.   Cardiovascular: Positive for leg swelling. Negative for chest pain.  Gastrointestinal: Negative for nausea, vomiting and abdominal pain.  Genitourinary: Negative for dysuria, hematuria and testicular pain.  Musculoskeletal: Negative for myalgias and arthralgias.  Skin: Negative for rash.  Neurological: Negative for dizziness, weakness and headaches.  A complete 10 system review of systems was obtained and all systems are negative except as noted in the HPI and PMH.      Allergies  Atorvastatin; Codeine; Crestor;  Meloxicam; and Statins  Home Medications   Prior to Admission medications   Medication Sig Start Date End Date Taking? Authorizing Provider  acetaminophen (TYLENOL ARTHRITIS PAIN) 650 MG CR tablet Take 1,300 mg by mouth 2 (two) times daily.    Yes Historical Provider, MD  aspirin 81 MG tablet Take 1 tablet (81 mg total) by mouth daily. 10/01/13  Yes Vesta Mixer, MD  calcitRIOL (ROCALTROL) 0.5 MCG capsule Take 0.5 mcg by mouth daily.   Yes Historical Provider, MD  calcium-vitamin D (OSCAL WITH D) 500-200 MG-UNIT per  tablet Take 2 tablets by mouth. Take 2 tabs Am and 1 tab Pm   Yes Historical Provider, MD  ezetimibe (ZETIA) 10 MG tablet Take 10 mg by mouth daily.     Yes Historical Provider, MD  fenofibrate 160 MG tablet Take 160 mg by mouth daily.   Yes Historical Provider, MD  fish oil-omega-3 fatty acids 1000 MG capsule Take 4,800 mg by mouth daily. Take 2 tabs. Am and 2 tabs Pm   Yes Historical Provider, MD  glucosamine-chondroitin 500-400 MG tablet Take 1 tablet by mouth 2 (two) times daily.    Yes Historical Provider, MD  losartan (COZAAR) 100 MG tablet Take 100 mg by mouth daily.  11/22/14  Yes Historical Provider, MD  nitroGLYCERIN (NITROSTAT) 0.4 MG SL tablet Place 1 tablet (0.4 mg total) under the tongue every 5 (five) minutes as needed for chest pain. 04/23/13 01/17/15 Yes Josephine Igo, MD  ranitidine (ZANTAC) 150 MG tablet Take 150 mg by mouth 2 (two) times daily.   Yes Historical Provider, MD  traZODone (DESYREL) 50 MG tablet Take 50 mg by mouth at bedtime.   Yes Historical Provider, MD  clopidogrel (PLAVIX) 75 MG tablet Take 1 tablet (75 mg total) by mouth daily. 01/16/15   Sherren Kerns, MD  oxyCODONE-acetaminophen (PERCOCET/ROXICET) 5-325 MG per tablet Take 1 tablet by mouth every 4 (four) hours as needed for severe pain. 01/17/15   Glynn Octave, MD   BP 135/65 mmHg  Pulse 54  Temp(Src) 97.8 F (36.6 C) (Oral)  Resp 17  Ht  (1.88 m)  Wt 206 lb (93.441 kg)  BMI 26.44 kg/m2  SpO2 93% Physical Exam  Constitutional: He is oriented to person, place, and time. He appears well-developed and well-nourished. No distress.  HENT:  Head: Normocephalic and atraumatic.  Mouth/Throat: Oropharynx is clear and moist. No oropharyngeal exudate.  Eyes: Conjunctivae and EOM are normal. Pupils are equal, round, and reactive to light.  Neck: Normal range of motion. Neck supple.  No meningismus.  Cardiovascular: Normal rate, regular rhythm, normal heart sounds and intact distal pulses.   No murmur  heard. Equal femoral pulses. No bruising and groin. Small apparent hematoma to left medial thigh that is indurated.  Pulmonary/Chest: Effort normal and breath sounds normal. No respiratory distress.  Abdominal: Soft. There is no tenderness. There is no rebound and no guarding.  Musculoskeletal: Normal range of motion. He exhibits no edema or tenderness.  Unable to palpate DP or PT pulses. With Doppler, DP and PT are present on the right, DP is present on the left. Unable to find PT.  Neurological: He is alert and oriented to person, place, and time. No cranial nerve deficit. He exhibits normal muscle tone. Coordination normal.  No ataxia on finger to nose bilaterally. No pronator drift. 5/5 strength throughout. CN 2-12 intact.Equal grip strength. Sensation intact.   Skin: Skin is warm.  Psychiatric: He has a normal mood and  affect. His behavior is normal.  Nursing note and vitals reviewed.   ED Course  Procedures (including critical care time) Labs Review Labs Reviewed  CBC WITH DIFFERENTIAL/PLATELET - Abnormal; Notable for the following:    RBC 4.21 (*)    Hemoglobin 12.5 (*)    HCT 36.8 (*)    All other components within normal limits  BASIC METABOLIC PANEL - Abnormal; Notable for the following:    Creatinine, Ser 1.42 (*)    GFR calc non Af Amer 52 (*)    All other components within normal limits  PROTIME-INR    Imaging Review No results found. I have personally reviewed and evaluated these images and lab results as part of my medical decision-making.   EKG Interpretation None      MDM   Final diagnoses:  Groin pain  Pseudoaneurysm   left groin pain after aortogram iliac stent placement yesterday.  Intact femoral pulses. Distal pulses as above. No appreciable ecchymosis or large hematoma. Hemoglobin stable.  Discussed with Dr. Durwin Nora who is seen patient. He advises to continue with arterial duplex to rule out pseudoaneurysm.  Small pseudoaneurysm seen 10 x 38 mm.  Dr. Edilia Bo states patient can have repeat US with Dr. Darrick Penna in the office next week. He also states that  patient could be admitted but it will be a long time before he is available from the OR. Patient does not want to be admitted. He understands need to followup with vascular surgery next week. He will return to the ED with worsening pain, swelling or any other concerns.    Glynn Octave, MD 01/17/15 [phone]

## 2015-01-17 NOTE — ED Notes (Signed)
Vascular at bedside with the patient.   

## 2015-01-17 NOTE — Progress Notes (Addendum)
*  PRELIMINARY RESULTS* Vascular Ultrasound Left groin pseudoaneurysm evaluation has been completed.  Preliminary findings: Pseudoaneurysm noted in the left groin measuring 38 mm x 9 mm with a neck measuring 3.8 mm   Farrel Demark, RDMS, RVT  01/17/2015, 3:10 PM

## 2015-01-19 ENCOUNTER — Telehealth: Payer: Self-pay

## 2015-01-19 DIAGNOSIS — Z48812 Encounter for surgical aftercare following surgery on the circulatory system: Secondary | ICD-10-CM

## 2015-01-19 DIAGNOSIS — I729 Aneurysm of unspecified site: Secondary | ICD-10-CM

## 2015-01-19 DIAGNOSIS — T81718A Complication of other artery following a procedure, not elsewhere classified, initial encounter: Secondary | ICD-10-CM

## 2015-01-19 NOTE — Telephone Encounter (Signed)
CEF is out of the office this week. CSD saw this pt in the [facility]- per Okey Regal, can we bring him in this Wednesday?  Spoke with pt, explained that he would see CSD- scheduled for 08/24 at 2:30pm. Dpm

## 2015-01-19 NOTE — Telephone Encounter (Signed)
Phone call from pt.  Reported he was seen in the ER on Sat., 8/20 for left groin pain.  Reported he was diagnosed with a small pseudoaneurysm.  He was advised to call the office and schedule appt. for an ultrasound of left groin, and appt. with Dr. Darrick Penna this week.  Stated the bruising of the left groin is more visible, and there is a soreness with walking, but feels the pain is getting better each day.  Stated he doesn't feel there has been any increase in swelling.  Advised will have a scheduler call him with an appt. for 8/25 for a left groin ultrasound and office appt. with Dr. Darrick Penna.  Encouraged to call the office if his symptoms worsen prior to appt. on 8/25.  Verb. Understanding.

## 2015-01-20 ENCOUNTER — Encounter: Payer: Self-pay | Admitting: Vascular Surgery

## 2015-01-20 ENCOUNTER — Telehealth: Payer: Self-pay | Admitting: Vascular Surgery

## 2015-01-20 NOTE — Telephone Encounter (Signed)
-----   Message from Sharee Pimple, RN sent at 01/16/2015 11:00 AM EDT ----- Regarding: Schedule   ----- Message -----    From: Sherren Kerns, MD    Sent: 01/16/2015  10:20 AM      To: Vvs Charge Pool  [place] both groins Bilateral femoral punctures Bilateral common iliac stents Aortogram with bilat runoff pelvic angio  He needs follow up in 1 month with bilateral ABIs  Fabienne Bruns

## 2015-01-21 ENCOUNTER — Ambulatory Visit (INDEPENDENT_AMBULATORY_CARE_PROVIDER_SITE_OTHER): Payer: Self-pay | Admitting: Vascular Surgery

## 2015-01-21 ENCOUNTER — Ambulatory Visit (HOSPITAL_COMMUNITY)
Admission: RE | Admit: 2015-01-21 | Discharge: 2015-01-21 | Disposition: A | Discharge status: Home / Self Care | Payer: BLUE CROSS/BLUE SHIELD | Source: Ambulatory Visit | Attending: Vascular Surgery | Admitting: Vascular Surgery

## 2015-01-21 ENCOUNTER — Encounter: Payer: Self-pay | Admitting: Vascular Surgery

## 2015-01-21 VITALS — BP 143/78 | HR 63 | Temp 98.3°F | Resp 16 | Ht 74.0 in | Wt 212.0 lb

## 2015-01-21 DIAGNOSIS — Z48812 Encounter for surgical aftercare following surgery on the circulatory system: Secondary | ICD-10-CM | POA: Insufficient documentation

## 2015-01-21 DIAGNOSIS — I998 Other disorder of circulatory system: Secondary | ICD-10-CM

## 2015-01-21 DIAGNOSIS — T81718A Complication of other artery following a procedure, not elsewhere classified, initial encounter: Secondary | ICD-10-CM

## 2015-01-21 DIAGNOSIS — I729 Aneurysm of unspecified site: Secondary | ICD-10-CM | POA: Insufficient documentation

## 2015-01-21 NOTE — Progress Notes (Signed)
Filed Vitals:   01/21/15 1520 01/21/15 1523  BP: 154/83 143/78  Pulse: 65 63  Temp: 98.3 F (36.8 C)   TempSrc: Oral   Resp: 16   Height:  (1.88 m)   Weight: 212 lb (96.163 kg)   SpO2: 99%

## 2015-01-21 NOTE — Progress Notes (Signed)
Patient ID: PHEONIX CLINKSCALE, male   DOB: [birth year], 59 y.o.   MRN: [id number]   Patient name: Derrick [name] MRN: [id number] DOB: 04/02/1956 Sex: male  REASON FOR VISIT: follow up of left common femoral artery pseudoaneurysm  HPI: Derrick [name] is a 58 y.o. male who underwent bilateral common iliac artery angioplasty and stents by Dr. Fabienne Bruns. He had presented to the emergency department with some slight swelling in the left groin and a duplex scan showed a 43 mm x 9 mm pseudoaneurysm originating off the common femoral artery. His exam was unimpressive. He comes in for follow up visit. He has minimal pain in the left groin. He has noticed some bruising in the left groin.  Current Outpatient Prescriptions  Medication Sig Dispense Refill  . acetaminophen (TYLENOL ARTHRITIS PAIN) 650 MG CR tablet Take 1,300 mg by mouth 2 (two) times daily.     Marland Kitchen aspirin 81 MG tablet Take 1 tablet (81 mg total) by mouth daily. 30 tablet 6  . calcitRIOL (ROCALTROL) 0.5 MCG capsule Take 0.5 mcg by mouth daily.    . calcium-vitamin D (OSCAL WITH D) 500-200 MG-UNIT per tablet Take 2 tablets by mouth. Take 2 tabs Am and 1 tab Pm    . clopidogrel (PLAVIX) 75 MG tablet Take 1 tablet (75 mg total) by mouth daily. 30 tablet 11  . ezetimibe (ZETIA) 10 MG tablet Take 10 mg by mouth daily.      . fenofibrate 160 MG tablet Take 160 mg by mouth daily.    . fish oil-omega-3 fatty acids 1000 MG capsule Take 4,800 mg by mouth daily. Take 2 tabs. Am and 2 tabs Pm    . glucosamine-chondroitin 500-400 MG tablet Take 1 tablet by mouth 2 (two) times daily.     Marland Kitchen losartan (COZAAR) 100 MG tablet Take 100 mg by mouth daily.     Marland Kitchen oxyCODONE-acetaminophen (PERCOCET/ROXICET) 5-325 MG per tablet Take 1 tablet by mouth every 4 (four) hours as needed for severe pain. 10 tablet 0  . ranitidine (ZANTAC) 150 MG tablet Take 150 mg by mouth 2 (two) times daily.    . traZODone (DESYREL) 50 MG tablet Take 50 mg by mouth at bedtime.    .  nitroGLYCERIN (NITROSTAT) 0.4 MG SL tablet Place 1 tablet (0.4 mg total) under the tongue every 5 (five) minutes as needed for chest pain. 25 tablet 12   No current facility-administered medications for this visit.   REVIEW OF SYSTEMS: Arly.Keller ] denotes positive finding; [  ] denotes negative finding  CARDIOVASCULAR:   chest pain    dyspnea on exertion    CONSTITUTIONAL:   fever    chills  PHYSICAL EXAM: Filed Vitals:   01/21/15 1520 01/21/15 1523  BP: 154/83 143/78  Pulse: 65 63  Temp: 98.3 F (36.8 C)   TempSrc: Oral   Resp: 16   Height:  (1.88 m)   Weight: 212 lb (96.163 kg)   SpO2: 99%    GENERAL: The patient is a well-nourished male, in no acute distress. The vital signs are documented above. CARDIOVASCULAR: There is a regular rate and rhythm. PULMONARY: There is good air exchange bilaterally without wheezing or rales. He has a small mass in the left groin consistent with hematoma overlying the femoral artery. Both feet are warm and well-perfused.  I have independently interpreted his duplex scan which shows that his pseudoaneurysm in the left groin is resolved.  MEDICAL ISSUES:  LEFT FEMORAL ARTERY PSEUDOANEURYSM: By duplex his left femoral artery pseudoaneurysm has resolved. He will keep his regularly scheduled appointment with Dr. Darrick Penna in 1 month. He knows to call sooner if he has problems.  Derrick [name] [facility] [place] Beeper: [phone]

## 2015-01-22 NOTE — Addendum Note (Signed)
Addended by: Adria Dill L on: 01/22/2015 10:10 AM   Modules accepted: Orders

## 2015-01-30 ENCOUNTER — Encounter (HOSPITAL_COMMUNITY): Payer: Self-pay

## 2015-01-30 ENCOUNTER — Encounter: Payer: Self-pay | Admitting: Vascular Surgery

## 2015-02-24 ENCOUNTER — Encounter: Payer: Self-pay | Admitting: Vascular Surgery

## 2015-02-26 ENCOUNTER — Ambulatory Visit (HOSPITAL_COMMUNITY)
Admission: RE | Admit: 2015-02-26 | Discharge: 2015-02-26 | Disposition: A | Discharge status: Home / Self Care | Payer: BLUE CROSS/BLUE SHIELD | Source: Ambulatory Visit | Attending: Vascular Surgery | Admitting: Vascular Surgery

## 2015-02-26 ENCOUNTER — Encounter: Payer: Self-pay | Admitting: Vascular Surgery

## 2015-02-26 ENCOUNTER — Ambulatory Visit (INDEPENDENT_AMBULATORY_CARE_PROVIDER_SITE_OTHER): Payer: BLUE CROSS/BLUE SHIELD | Admitting: Vascular Surgery

## 2015-02-26 VITALS — BP 157/62 | HR 64 | Temp 97.9°F | Ht 74.0 in | Wt 211.0 lb

## 2015-02-26 DIAGNOSIS — Z95828 Presence of other vascular implants and grafts: Secondary | ICD-10-CM | POA: Diagnosis not present

## 2015-02-26 DIAGNOSIS — I739 Peripheral vascular disease, unspecified: Secondary | ICD-10-CM | POA: Insufficient documentation

## 2015-02-26 DIAGNOSIS — Z9889 Other specified postprocedural states: Secondary | ICD-10-CM

## 2015-02-26 DIAGNOSIS — I10 Essential (primary) hypertension: Secondary | ICD-10-CM | POA: Insufficient documentation

## 2015-02-26 DIAGNOSIS — Z9862 Peripheral vascular angioplasty status: Secondary | ICD-10-CM

## 2015-02-26 DIAGNOSIS — Z87891 Personal history of nicotine dependence: Secondary | ICD-10-CM | POA: Diagnosis not present

## 2015-02-26 NOTE — Progress Notes (Signed)
[facility] [place] HISTORY AND PHYSICAL    History of Present Illness:  Patient is a 59 y.o. year old male who presents for evaluation of bilateral leg pain. He underwent bilateral common iliac artery stenting several weeks ago. He states he has not noticed any difference in his claudication symptoms. The patient complains of a burning sensation on the right anterior thigh in the dorsum of both feet. This occurs intermittently. He also complains of pain in both calves left greater than right after walking 100 feet. The pain in the calf resolves fairly quickly by resting. It returns pretty consistently.  He denies rest pain. He denies any nonhealing wounds. He is a former smoker quitting in 1991. The problem has been slowly progressive over the last 2 years. He is having difficulty at work because he can't get around very well.  He has also had an MRI of the lumbar spine which showed some degenerative joint disease..  Other medical problems include coronary artery disease, hyperlipidemia (intolerant to statins), degenerative joint disease all of which are currently stable. He did have an episode of renal insufficiency in the past and was documented as CK D3. The patient states this was related to meloxicam and his kidney function is now normal.    Past Medical History   Diagnosis  Date   .  Hyperparathyroidism     .  Hypertension     .  Dyslipidemia     .  OA (osteoarthritis)     .  Metatarsalgia     .  Hyperlipidemia         statin intolerant   .  GERD (gastroesophageal reflux disease)     .  Coronary artery disease  2006       stents x2=LAD LCX; totally occluded RCA   .  Seasonal allergies     .  Insomnia     .  Peripheral vascular disease         Past Surgical History   Procedure  Laterality  Date   .  Cervical spine surgery    2004   .  Cardiac catheterization    10/28/2004       circ and mid LAD chyper stents   .  Cervical fusion    [id number]   .  Incision and  drainage abscess posterior cervicalspine    2009   .  Elbow arthroplasty    1994       right   .  Colonoscopy       .  Shoulder open rotator cuff repair  Right  04/23/2013       Procedure: RIGHT TWO TENDON ROTATOR CUFF REPAIR  SUBACROMIAL DECOMPRESSION AND OPEN DISTAL CLAVICLE RESECTION;  Surgeon: Wyn Forsterobert V Sypher Jr., MD;  Location: [facility];  Service: Orthopedics;  Laterality: Right;     Social History Social History   Substance Use Topics   .  Smoking status:  Former Smoker       Types:  Cigarettes       Quit date:  05/30/1989   .  Smokeless tobacco:  Never Used   .  Alcohol Use:  Yes         Comment: one drink a month     Family History Family History   Problem  Relation  Age of Onset   .  Arthritis  Mother     .  Varicose Veins  Mother     .  Lung  cancer  Father     .  COPD  Father         Lung   .  Cancer  Brother         Lukemia   .  COPD  Brother     .  Cancer  Brother         Prostate     Allergies    Allergies   Allergen  Reactions   .  Atorvastatin         Muscle aches   .  Codeine  Itching   .  Crestor [Rosuvastatin Calcium]         Muscle aches        Current Outpatient Prescriptions   Medication  Sig  Dispense  Refill   .  acetaminophen (TYLENOL ARTHRITIS PAIN) 650 MG CR tablet  2 (two) times daily. Take 1 tab twice a day       .  aspirin 81 MG tablet  Take 1 tablet (81 mg total) by mouth daily.  30 tablet  6   .  calcitRIOL (ROCALTROL) 0.5 MCG capsule  Take 0.5 mcg by mouth daily.       .  calcium-vitamin D (OSCAL WITH D) 500-200 MG-UNIT per tablet  Take 2 tablets by mouth. Take 2 tabs Am and 1 tab Pm       .  ezetimibe (ZETIA) 10 MG tablet  Take 10 mg by mouth daily.         .  fenofibrate 160 MG tablet  Take 160 mg by mouth daily.       .  fish oil-omega-3 fatty acids 1000 MG capsule  Take 4,800 mg by mouth daily. Take 2 tabs. Am and 2 tabs Pm       .  glucosamine-chondroitin 500-400 MG tablet  Take 1 tablet by mouth. 2 Tabs  twice daily       .  losartan (COZAAR) 100 MG tablet  daily.       .  ranitidine (ZANTAC) 150 MG tablet  Take 150 mg by mouth 2 (two) times daily.       .  traZODone (DESYREL) 50 MG tablet  Take 50 mg by mouth at bedtime.       .  fexofenadine (ALLEGRA) 180 MG tablet  Take 180 mg by mouth daily.       Marland Kitchen  losartan (COZAAR) 50 MG tablet  Take 100 mg by mouth daily.        .  nitroGLYCERIN (NITROSTAT) 0.4 MG SL tablet  Place 1 tablet (0.4 mg total) under the tongue every 5 (five) minutes as needed for chest pain.  25 tablet  12      No current facility-administered medications for this visit.     ROS:    General:  No weight loss, Fever, chills  HEENT: No recent headaches, no nasal bleeding, no visual changes, no sore throat  Neurologic: No dizziness, blackouts, seizures. No recent symptoms of stroke or mini- stroke. No recent episodes of slurred speech, or temporary blindness.  Cardiac: No recent episodes of chest pain/pressure, no shortness of breath at rest.  No shortness of breath with exertion.  Denies history of atrial fibrillation or irregular heartbeat  Vascular: No history of rest pain in feet.  + history of claudication.  No history of non-healing ulcer, No history of DVT    Pulmonary: No home oxygen, no productive cough, no hemoptysis,  No asthma or wheezing  Musculoskeletal:  [  x ] Arthritis, [x ] Low back pain,   Joint pain  Hematologic:No history of hypercoagulable state.  No history of easy bleeding.  No history of anemia  Gastrointestinal: No hematochezia or melena,  No gastroesophageal reflux, no trouble swallowing  Urinary: [x ] chronic Kidney disease,  on HD -  MWF or  TTHS,  Burning with urination,  Frequent urination,  Difficulty urinating;    Skin: No rashes  Psychological: No history of anxiety,  No history of depression   Physical Examination    Filed Vitals:   02/26/15 1404  BP: 157/62  Pulse: 64  Temp: 97.9 F (36.6 C)  TempSrc:  Oral  Height:  (1.88 m)  Weight: 211 lb (95.709 kg)  SpO2: 100%    General:  Alert and oriented, no acute distress HEENT: Normal Extremity Pulses:  2+ radial, brachial, femoral, absent popliteal dorsalis pedis, posterior tibial pulses bilaterally Musculoskeletal: No deformity or edema     Neurologic: Upper and lower extremity motor 5/5 and symmetric  DATA:  Patient had bilateral ABIs performed which were 0.78 on the right 0.6 on the left which is unchanged from prior to his iliac stenting procedure.  ASSESSMENT:  Bilateral lower extremity claudication secondary to superficial femoral artery occlusive disease. Most likely the pain in his feet and numbness in his thigh or unrelated problems related to degenerative joint disease   PLAN:  Had a lengthy discussion with the patient today regarding the natural history of peripheral arterial disease. He still has claudication symptoms after his bilateral iliac stents. He has bilateral superficial femoral artery occlusive disease which is not amenable to stenting. Told him that his lifetime risk of amputation is less than 5% as long as he continues to refrain from smoking. I discussed with him today the possibility of conservative management with a walking program of daily walks of 30 minutes to try to increase collateralization and muscle tolerance. However he states that basically he is artery doing this at work and he feels like his quality of life is severely diminished in his current status.  He has opted at this point for bilateral staged femoral-popliteal bypasses. We will have him evaluated by Dr. Elease Hashimoto for cardiac risk stratification. If his cardiac status is reasonable we will schedule him first for a left femoropopliteal bypass followed subsequently by right femoropopliteal bypass after he recovers from this.  Fabienne Bruns, MD [facility] [place] Office: [phone] Pager: [phone]

## 2015-02-26 NOTE — Addendum Note (Signed)
Addended by: Adria Dill L on: 02/26/2015 04:27 PM   Modules accepted: Orders

## 2015-03-03 ENCOUNTER — Other Ambulatory Visit: Payer: Self-pay

## 2015-03-03 ENCOUNTER — Telehealth: Payer: Self-pay | Admitting: Cardiovascular Disease

## 2015-03-03 NOTE — Telephone Encounter (Signed)
New message       Request for surgical clearance:  1. What type of surgery is being performed?  Bypass left femural poplitel   When is this surgery scheduled? 03-17-15 Are there any medications that need to be held prior to surgery and how long? Long plavix and medical clearance 2. Name of physician performing surgery?  Dr Darrick Penna  3. What is your office phone and fax number?  Fax [phone]

## 2015-03-04 ENCOUNTER — Telehealth (HOSPITAL_COMMUNITY): Payer: Self-pay | Admitting: *Deleted

## 2015-03-04 NOTE — Telephone Encounter (Signed)
Patient given detailed instructions per Myocardial Perfusion Study Information Sheet for test on 03/04/15 at .8 am Patient notified to arrive 15 minutes early and that it is imperative to arrive on time for appointment to keep from having the test rescheduled.  If you need to cancel or reschedule your appointment, please call the office within 24 hours of your appointment. Failure to do so may result in a cancellation of your appointment, and a $50 no show fee. Patient verbalized understanding.Antionette Char, RN

## 2015-03-06 ENCOUNTER — Ambulatory Visit (HOSPITAL_COMMUNITY): Payer: BLUE CROSS/BLUE SHIELD | Attending: Vascular Surgery

## 2015-03-06 DIAGNOSIS — I10 Essential (primary) hypertension: Secondary | ICD-10-CM | POA: Insufficient documentation

## 2015-03-06 DIAGNOSIS — I739 Peripheral vascular disease, unspecified: Secondary | ICD-10-CM

## 2015-03-06 DIAGNOSIS — R9439 Abnormal result of other cardiovascular function study: Secondary | ICD-10-CM | POA: Insufficient documentation

## 2015-03-06 MED ORDER — TECHNETIUM TC 99M SESTAMIBI GENERIC - CARDIOLITE
10.5000 | Freq: Once | INTRAVENOUS | Status: AC | PRN
  Administered 2015-03-06: 11 via INTRAVENOUS

## 2015-03-06 MED ORDER — TECHNETIUM TC 99M SESTAMIBI GENERIC - CARDIOLITE
30.7000 | Freq: Once | INTRAVENOUS | Status: AC | PRN
  Administered 2015-03-06: 30.7 via INTRAVENOUS

## 2015-03-06 MED ORDER — REGADENOSON 0.4 MG/5ML IV SOLN
0.4000 mg | Freq: Once | INTRAVENOUS | Status: AC
  Administered 2015-03-06: 0.4 mg via INTRAVENOUS

## 2015-03-08 LAB — MYOCARDIAL PERFUSION IMAGING
LV dias vol: 140 mL
LV sys vol: 71 mL
RATE: 0.27
Rest HR: 49 {beats}/min
SDS: 3
SRS: 6
SSS: 9
TID: 1.32

## 2015-03-10 ENCOUNTER — Other Ambulatory Visit: Payer: Self-pay | Admitting: *Deleted

## 2015-03-10 DIAGNOSIS — I739 Peripheral vascular disease, unspecified: Secondary | ICD-10-CM

## 2015-03-10 MED ORDER — CLOPIDOGREL BISULFATE 75 MG PO TABS: 75.0000 mg | ORAL_TABLET | Freq: Every day | ORAL | Status: DC

## 2015-03-12 ENCOUNTER — Encounter (HOSPITAL_COMMUNITY): Payer: Self-pay

## 2015-03-12 ENCOUNTER — Encounter (HOSPITAL_COMMUNITY)
Admission: RE | Admit: 2015-03-12 | Discharge: 2015-03-12 | Disposition: A | Discharge status: Home / Self Care | Payer: BLUE CROSS/BLUE SHIELD | Source: Ambulatory Visit | Attending: Vascular Surgery | Admitting: Vascular Surgery

## 2015-03-12 DIAGNOSIS — Z0183 Encounter for blood typing: Secondary | ICD-10-CM | POA: Diagnosis not present

## 2015-03-12 DIAGNOSIS — K219 Gastro-esophageal reflux disease without esophagitis: Secondary | ICD-10-CM | POA: Diagnosis not present

## 2015-03-12 DIAGNOSIS — E213 Hyperparathyroidism, unspecified: Secondary | ICD-10-CM | POA: Insufficient documentation

## 2015-03-12 DIAGNOSIS — I10 Essential (primary) hypertension: Secondary | ICD-10-CM | POA: Diagnosis not present

## 2015-03-12 DIAGNOSIS — Z981 Arthrodesis status: Secondary | ICD-10-CM | POA: Diagnosis not present

## 2015-03-12 DIAGNOSIS — Z87891 Personal history of nicotine dependence: Secondary | ICD-10-CM | POA: Diagnosis not present

## 2015-03-12 DIAGNOSIS — E785 Hyperlipidemia, unspecified: Secondary | ICD-10-CM | POA: Diagnosis not present

## 2015-03-12 DIAGNOSIS — Z79899 Other long term (current) drug therapy: Secondary | ICD-10-CM | POA: Diagnosis not present

## 2015-03-12 DIAGNOSIS — Z955 Presence of coronary angioplasty implant and graft: Secondary | ICD-10-CM | POA: Insufficient documentation

## 2015-03-12 DIAGNOSIS — Z01812 Encounter for preprocedural laboratory examination: Secondary | ICD-10-CM | POA: Insufficient documentation

## 2015-03-12 DIAGNOSIS — I739 Peripheral vascular disease, unspecified: Secondary | ICD-10-CM | POA: Insufficient documentation

## 2015-03-12 DIAGNOSIS — Z7902 Long term (current) use of antithrombotics/antiplatelets: Secondary | ICD-10-CM | POA: Diagnosis not present

## 2015-03-12 DIAGNOSIS — I2510 Atherosclerotic heart disease of native coronary artery without angina pectoris: Secondary | ICD-10-CM | POA: Diagnosis not present

## 2015-03-12 DIAGNOSIS — Z7982 Long term (current) use of aspirin: Secondary | ICD-10-CM | POA: Diagnosis not present

## 2015-03-12 DIAGNOSIS — Z01818 Encounter for other preprocedural examination: Secondary | ICD-10-CM | POA: Insufficient documentation

## 2015-03-12 LAB — CBC
HCT: 38.1 % — ABNORMAL LOW (ref 39.0–52.0)
Hemoglobin: 12.9 g/dL — ABNORMAL LOW (ref 13.0–17.0)
MCH: 29.8 pg (ref 26.0–34.0)
MCHC: 33.9 g/dL (ref 30.0–36.0)
MCV: 88 fL (ref 78.0–100.0)
Platelets: 310 10*3/uL (ref 150–400)
RBC: 4.33 MIL/uL (ref 4.22–5.81)
RDW: 12.7 % (ref 11.5–15.5)
WBC: 6.1 10*3/uL (ref 4.0–10.5)

## 2015-03-12 LAB — URINE MICROSCOPIC-ADD ON

## 2015-03-12 LAB — COMPREHENSIVE METABOLIC PANEL
ALT: 16 U/L — ABNORMAL LOW (ref 17–63)
AST: 20 U/L (ref 15–41)
Albumin: 4.5 g/dL (ref 3.5–5.0)
Alkaline Phosphatase: 32 U/L — ABNORMAL LOW (ref 38–126)
Anion gap: 9 (ref 5–15)
BUN: 18 mg/dL (ref 6–20)
CO2: 26 mmol/L (ref 22–32)
Calcium: 10.1 mg/dL (ref 8.9–10.3)
Chloride: 105 mmol/L (ref 101–111)
Creatinine, Ser: 1.45 mg/dL — ABNORMAL HIGH (ref 0.61–1.24)
GFR calc Af Amer: 59 mL/min — ABNORMAL LOW (ref >60)
GFR calc non Af Amer: 51 mL/min — ABNORMAL LOW (ref >60)
Glucose, Bld: 106 mg/dL — ABNORMAL HIGH (ref 65–99)
Potassium: 4.1 mmol/L (ref 3.5–5.1)
Sodium: 140 mmol/L (ref 135–145)
Total Bilirubin: 0.5 mg/dL (ref 0.3–1.2)
Total Protein: 7.9 g/dL (ref 6.5–8.1)

## 2015-03-12 LAB — URINALYSIS, ROUTINE W REFLEX MICROSCOPIC
Bilirubin Urine: NEGATIVE
Glucose, UA: NEGATIVE mg/dL
Ketones, ur: NEGATIVE mg/dL
Leukocytes, UA: NEGATIVE
Nitrite: NEGATIVE
Protein, ur: NEGATIVE mg/dL
Specific Gravity, Urine: 1.016 (ref 1.005–1.030)
Urobilinogen, UA: 0.2 mg/dL (ref 0.0–1.0)
pH: 6.5 (ref 5.0–8.0)

## 2015-03-12 LAB — TYPE AND SCREEN
ABO/RH(D): O POS
Antibody Screen: NEGATIVE

## 2015-03-12 LAB — SURGICAL PCR SCREEN
MRSA, PCR: NEGATIVE
Staphylococcus aureus: POSITIVE — AB

## 2015-03-12 LAB — PROTIME-INR
INR: 1.08 (ref 0.00–1.49)
Prothrombin Time: 14.2 seconds (ref 11.6–15.2)

## 2015-03-12 LAB — ABO/RH: ABO/RH(D): O POS

## 2015-03-12 LAB — APTT: aPTT: 29 seconds (ref 24–37)

## 2015-03-12 NOTE — Progress Notes (Signed)
PCP is Dr Sigmund HazelLisa Miller Cardiologist if Dr Melburn PopperNasher Pt states he was told to stop taking his Plavix today-last dose 03-12-15 and continue taking baby aspirin. Card cath in 2006 Stress test 03-06-15

## 2015-03-12 NOTE — Pre-Procedure Instructions (Signed)
Derrick [name]  03/12/2015      [facility] [place] - [address] AT PORTAL TO REGISTERED [facility] SITES [address] [place] [address] Phone: [phone] Fax: [phone]  [phone] [place], [place] - [address] MA[address] [place] [address] Phone: [phone] Fax: [phone]    Your procedure is scheduled on Oct 18  Report to [facility] Admitting at [phone].  Call this number if you have problems the morning of surgery:  [phone]   Remember:  Do not eat food or drink liquids after midnight.  Take these medicines the morning of surgery with A SIP OF WATER percocet if needed, ranitidine (Zantac)  Stop Plavix as directed by your Doctor  Stop taking aspirin, Ibuprofen Aleve, BC's, Goody's, Herbal medications, Fish Oil, Vitamins   Do not wear jewelry, make-up or nail polish.  Do not wear lotions, powders, or perfumes.  You may wear deodorant.  Do not shave 48 hours prior to surgery.  Men may shave face and neck.  Do not bring valuables to the hospital.  [facility] is not responsible for any belongings or valuables.  Contacts, dentures or bridgework may not be worn into surgery.  Leave your suitcase in the car.  After surgery it may be brought to your room.  For patients admitted to the hospital, discharge time will be determined by your treatment team.  Patients discharged the day of surgery will not be allowed to drive home.    Special instructions:  [place] - Preparing for Surgery  Before surgery, you can play an important role.  Because skin is not sterile, your skin needs to be as free of germs as possible.  You can reduce the number of germs on you skin by washing with CHG (chlorahexidine gluconate) soap before surgery.  CHG is an antiseptic cleaner which kills germs and bonds with the skin to continue killing germs even after washing.  Please DO NOT use if  you have an allergy to CHG or antibacterial soaps.  If your skin becomes reddened/irritated stop using the CHG and inform your nurse when you arrive at Short Stay.  Do not shave (including legs and underarms) for at least 48 hours prior to the first CHG shower.  You may shave your face.  Please follow these instructions carefully:   1.  Shower with CHG Soap the night before surgery and the   morning of Surgery.  2.  If you choose to wash your hair, wash your hair first as usual with your  normal shampoo.  3.  After you shampoo, rinse your hair and body thoroughly to remove the  Shampoo.  4.  Use CHG as you would any other liquid soap.  You can apply chg directly  to the skin and wash gently with scrungie or a clean washcloth.  5.  Apply the CHG Soap to your body ONLY FROM THE NECK DOWN.    Do not use on open wounds or open sores.  Avoid contact with your eyes,  ears, mouth and genitals (private parts).  Wash genitals (private parts)       with your normal soap.  6.  Wash thoroughly, paying special attention to the area where your surgery will be performed.  7.  Thoroughly rinse your body with warm water from the neck down.  8.  DO NOT shower/wash with your normal soap after using and rinsing off  the CHG Soap.  9.  Pat yourself dry with a clean towel.            10.  Wear clean pajamas.            11.  Place clean sheets on your bed the night of your first shower and do not sleep with pets.  Day of Surgery  Do not apply any lotions/deoderants the morning of surgery.  Please wear clean clothes to the hospital/surgery center.     Please read over the following fact sheets that you were given. Pain Booklet, Coughing and Deep Breathing, Blood Transfusion Information, MRSA Information and Surgical Site Infection Prevention

## 2015-03-12 NOTE — Progress Notes (Signed)
[facility] [address] [place] for positive PCR of staph. Left message on pt's voicemail informing him of results and need to pick up Rx.

## 2015-03-13 NOTE — Progress Notes (Addendum)
Anesthesia Chart Review: Patient is a 59 year old male scheduled for left FPBG on 03/17/15 by Dr. Darrick PennaFields. He will need right FPBG in the future as well.  History includes former smoker, HLD (statin intolerant), HTN, hyperparathyroidism, GERD, CAD s/p stents to LAD, Cx (occluded RCA with collateral flow) '06, insomnia, PAD s/p bilateral CIA stents 12/2014 complicated by small left femoral pseudoaneurysm (now resolved by duplex), cervical fusion. AKI related to NSAIDS in the past. PCP is Dr. Sigmund HazelLisa Miller. Cardiologist is Dr. Elease HashimotoNahser. Dr. Darrick PennaFields office did notify Dr. Harvie BridgeNahser's office of surgery plans, but preemptively ordered a pre-operative stress test that was non-ischemic, EF 49% (45-54%) and not significantly changes since 2014.   Meds include ASA, Plavix, Zeta, fish oil, losartan, Nitro, Zantac, trazodone, Percocet. He was instructed to hold Plavix after 03/12/15 and continue ASA.  03/06/15 Nuclear stress test: Perfusion Summary: There is a medium defect of mild severity present in the basal inferior, mid inferior and apical inferior location. The defect is non-reversible and consistent with artifact. Inferior defect consistent with possible soft tissue attenuation and/or scar. No ischemia. Overall Study Impression: Myocardial perfusion is abnormal. The study is normal. This is a low risk study. Overall left ventricular systolic function was abnormal. LV cavity size is normal. Nuclear stress EF: 49%. The left ventricular ejection fraction is mildly decreased (45-54%). There are no significant changes in comparison to the prior study. When compared to perfusion images from 2014 there is no significant change.  Last cath was 10/28/04 and showed 3V CAD with RCA occlusion with right to left collaterals and focal LAD and CX lesions and EF 50-55% s/p MID LAD and MID CX CYPHER DES.  01/16/15 EKG: SB at 52 bpm.  Preoperative labs noted. Cr 1.45. Glucose 106. H/H 12.9/38.1. PT/PTT WNL. T&S done.  Patient with recent  stable, non-ischemic stress test. No CV symptoms per [facility] records. Unless, [facility] hears otherwise from Dr. Harvie BridgeNahser's office I would anticipate that he could proceed as planned.  Velna Ochsllison Deryk Bozman, PA-C [facility]Anesthesiology Phone [phone] 03/13/2015 11:04 AM  Addendum: See telephone encounter by Dr. Elease HashimotoNahser from this morning. Patient is felt to be low-moderate risk for upcoming vascular surgery.  Velna Ochsllison Dashel Goines, PA-C [facility]iology Phone [phone] 03/16/2015 1:19 PM

## 2015-03-16 NOTE — Telephone Encounter (Signed)
Mr. Derrick [name] is scheduled for vascular surgery tomorrow. I last saw him in the office May, 2015. I called him this am and discussed his symptoms and the results of the myoview .  He has done well.  No angina pain. He has PVD so he is limited in his exercise capacity from that standpoint. Recent myoview shows inferior scar and is unchanged from his previous myoview from Nov. 2014. He has a known RCA occlusion .  He appears to be stable He has not had any symptoms of angina. His myoview is low risk and his LV EF has not changed significantly.  He is at low - moderate risk for his upcoming vascular surgery tomorrow.  We will be available to see him in the hospital if needed.    Madelena Maturin, Deloris PingPhilip J, MD  03/16/2015 8:15 AM    [facility] [address]  [address] [place], [place]  [address] Pager [phone] Phone: [phone]; Fax: [phone]   [facility] Office  [address] [place], [place]  [address] [phone]   Fax [phone]

## 2015-03-17 ENCOUNTER — Encounter (HOSPITAL_COMMUNITY): Payer: Self-pay | Admitting: *Deleted

## 2015-03-17 ENCOUNTER — Inpatient Hospital Stay (HOSPITAL_COMMUNITY): Payer: BLUE CROSS/BLUE SHIELD

## 2015-03-17 ENCOUNTER — Inpatient Hospital Stay (HOSPITAL_COMMUNITY)
Admission: RE | Admit: 2015-03-17 | Discharge: 2015-03-19 | DRG: 254 | Disposition: A | Discharge status: Home Health | Payer: BLUE CROSS/BLUE SHIELD | Source: Ambulatory Visit | Attending: Vascular Surgery | Admitting: Vascular Surgery

## 2015-03-17 ENCOUNTER — Encounter (HOSPITAL_COMMUNITY)
Admission: RE | Disposition: A | Discharge status: Home Health | Payer: Self-pay | Source: Ambulatory Visit | Attending: Vascular Surgery

## 2015-03-17 ENCOUNTER — Inpatient Hospital Stay (HOSPITAL_COMMUNITY): Payer: BLUE CROSS/BLUE SHIELD | Admitting: Certified Registered"

## 2015-03-17 ENCOUNTER — Inpatient Hospital Stay (HOSPITAL_COMMUNITY): Payer: BLUE CROSS/BLUE SHIELD | Admitting: Vascular Surgery

## 2015-03-17 DIAGNOSIS — K219 Gastro-esophageal reflux disease without esophagitis: Secondary | ICD-10-CM | POA: Diagnosis present

## 2015-03-17 DIAGNOSIS — Z888 Allergy status to other drugs, medicaments and biological substances status: Secondary | ICD-10-CM | POA: Diagnosis not present

## 2015-03-17 DIAGNOSIS — Z0279 Encounter for issue of other medical certificate: Secondary | ICD-10-CM

## 2015-03-17 DIAGNOSIS — E213 Hyperparathyroidism, unspecified: Secondary | ICD-10-CM | POA: Diagnosis present

## 2015-03-17 DIAGNOSIS — I129 Hypertensive chronic kidney disease with stage 1 through stage 4 chronic kidney disease, or unspecified chronic kidney disease: Secondary | ICD-10-CM | POA: Diagnosis present

## 2015-03-17 DIAGNOSIS — Z885 Allergy status to narcotic agent status: Secondary | ICD-10-CM

## 2015-03-17 DIAGNOSIS — N183 Chronic kidney disease, stage 3 (moderate): Secondary | ICD-10-CM | POA: Diagnosis present

## 2015-03-17 DIAGNOSIS — Z981 Arthrodesis status: Secondary | ICD-10-CM

## 2015-03-17 DIAGNOSIS — M479 Spondylosis, unspecified: Secondary | ICD-10-CM | POA: Diagnosis present

## 2015-03-17 DIAGNOSIS — Z7982 Long term (current) use of aspirin: Secondary | ICD-10-CM | POA: Diagnosis not present

## 2015-03-17 DIAGNOSIS — I70213 Atherosclerosis of native arteries of extremities with intermittent claudication, bilateral legs: Secondary | ICD-10-CM | POA: Diagnosis not present

## 2015-03-17 DIAGNOSIS — Z87891 Personal history of nicotine dependence: Secondary | ICD-10-CM

## 2015-03-17 DIAGNOSIS — E785 Hyperlipidemia, unspecified: Secondary | ICD-10-CM | POA: Diagnosis present

## 2015-03-17 DIAGNOSIS — Z419 Encounter for procedure for purposes other than remedying health state, unspecified: Secondary | ICD-10-CM

## 2015-03-17 DIAGNOSIS — I739 Peripheral vascular disease, unspecified: Principal | ICD-10-CM | POA: Diagnosis present

## 2015-03-17 DIAGNOSIS — I2510 Atherosclerotic heart disease of native coronary artery without angina pectoris: Secondary | ICD-10-CM | POA: Diagnosis present

## 2015-03-17 DIAGNOSIS — Z79899 Other long term (current) drug therapy: Secondary | ICD-10-CM

## 2015-03-17 DIAGNOSIS — Z9889 Other specified postprocedural states: Secondary | ICD-10-CM

## 2015-03-17 HISTORY — PX: FEMORAL-POPLITEAL BYPASS GRAFT: SHX937

## 2015-03-17 HISTORY — PX: ENDARTERECTOMY FEMORAL: SHX5804

## 2015-03-17 HISTORY — PX: PATCH ANGIOPLASTY: SHX6230

## 2015-03-17 HISTORY — PX: INTRAOPERATIVE ARTERIOGRAM: SHX5157

## 2015-03-17 LAB — CREATININE, SERUM
Creatinine, Ser: 1.45 mg/dL — ABNORMAL HIGH (ref 0.61–1.24)
GFR calc Af Amer: 59 mL/min — ABNORMAL LOW (ref >60)
GFR calc non Af Amer: 51 mL/min — ABNORMAL LOW (ref >60)

## 2015-03-17 LAB — CBC
HCT: 31.4 % — ABNORMAL LOW (ref 39.0–52.0)
Hemoglobin: 10.3 g/dL — ABNORMAL LOW (ref 13.0–17.0)
MCH: 29.2 pg (ref 26.0–34.0)
MCHC: 32.8 g/dL (ref 30.0–36.0)
MCV: 89 fL (ref 78.0–100.0)
Platelets: 232 10*3/uL (ref 150–400)
RBC: 3.53 MIL/uL — ABNORMAL LOW (ref 4.22–5.81)
RDW: 12.9 % (ref 11.5–15.5)
WBC: 11.3 10*3/uL — ABNORMAL HIGH (ref 4.0–10.5)

## 2015-03-17 SURGERY — BYPASS GRAFT FEMORAL-POPLITEAL ARTERY
Anesthesia: General | Site: Leg Lower | Laterality: Left

## 2015-03-17 MED ORDER — CLOPIDOGREL BISULFATE 75 MG PO TABS
75.0000 mg | ORAL_TABLET | Freq: Every day | ORAL | Status: DC
  Administered 2015-03-18 – 2015-03-19 (×2): 75 mg via ORAL
  Filled 2015-03-17 (×2): qty 1

## 2015-03-17 MED ORDER — FENTANYL CITRATE (PF) 250 MCG/5ML IJ SOLN
INTRAMUSCULAR | Status: AC
  Filled 2015-03-17: qty 5

## 2015-03-17 MED ORDER — HEPARIN SODIUM (PORCINE) 1000 UNIT/ML IJ SOLN
INTRAMUSCULAR | Status: DC | PRN
  Administered 2015-03-17 (×2): 8000 [IU] via INTRAVENOUS
  Administered 2015-03-17: 10000 [IU] via INTRAVENOUS

## 2015-03-17 MED ORDER — LIDOCAINE HCL (CARDIAC) 20 MG/ML IV SOLN: INTRAVENOUS | Status: DC | PRN

## 2015-03-17 MED ORDER — FENOFIBRATE 160 MG PO TABS
160.0000 mg | ORAL_TABLET | Freq: Every day | ORAL | Status: DC
  Administered 2015-03-17 – 2015-03-19 (×3): 160 mg via ORAL
  Filled 2015-03-17 (×3): qty 1

## 2015-03-17 MED ORDER — MUPIROCIN 2 % EX OINT
1.0000 "application " | TOPICAL_OINTMENT | Freq: Two times a day (BID) | CUTANEOUS | Status: DC
  Administered 2015-03-17 – 2015-03-19 (×4): 1 via NASAL
  Filled 2015-03-17 (×4): qty 22

## 2015-03-17 MED ORDER — LACTATED RINGERS IV SOLN
INTRAVENOUS | Status: DC | PRN
  Administered 2015-03-17 (×3): via INTRAVENOUS

## 2015-03-17 MED ORDER — SENNOSIDES-DOCUSATE SODIUM 8.6-50 MG PO TABS
1.0000 | ORAL_TABLET | Freq: Every evening | ORAL | Status: DC | PRN
  Filled 2015-03-17: qty 1

## 2015-03-17 MED ORDER — CHLORHEXIDINE GLUCONATE CLOTH 2 % EX PADS: 6.0000 | MEDICATED_PAD | Freq: Once | CUTANEOUS | Status: DC

## 2015-03-17 MED ORDER — POTASSIUM CHLORIDE CRYS ER 20 MEQ PO TBCR
20.0000 meq | EXTENDED_RELEASE_TABLET | Freq: Every day | ORAL | Status: DC | PRN
Start: 2015-03-17 — End: 2015-03-19

## 2015-03-17 MED ORDER — ONDANSETRON HCL 4 MG/2ML IJ SOLN
INTRAMUSCULAR | Status: DC | PRN
  Administered 2015-03-17: 8 mg via INTRAVENOUS

## 2015-03-17 MED ORDER — GUAIFENESIN-DM 100-10 MG/5ML PO SYRP: 15.0000 mL | ORAL_SOLUTION | ORAL | Status: DC | PRN

## 2015-03-17 MED ORDER — ASPIRIN EC 81 MG PO TBEC
81.0000 mg | DELAYED_RELEASE_TABLET | Freq: Every day | ORAL | Status: DC
Start: 2015-03-17 — End: 2015-03-19
  Administered 2015-03-17 – 2015-03-19 (×3): 81 mg via ORAL
  Filled 2015-03-17 (×3): qty 1

## 2015-03-17 MED ORDER — DEXTROSE 5 % IV SOLN
1.5000 g | INTRAVENOUS | Status: DC
  Filled 2015-03-17: qty 1.5

## 2015-03-17 MED ORDER — POTASSIUM 99 MG PO TABS: 1.0000 | ORAL_TABLET | Freq: Every day | ORAL | Status: DC

## 2015-03-17 MED ORDER — GLYCOPYRROLATE 0.2 MG/ML IJ SOLN
INTRAMUSCULAR | Status: DC | PRN
  Administered 2015-03-17 (×2): 0.2 mg via INTRAVENOUS

## 2015-03-17 MED ORDER — PROTAMINE SULFATE 10 MG/ML IV SOLN
INTRAVENOUS | Status: DC | PRN
  Administered 2015-03-17 (×2): 50 mg via INTRAVENOUS

## 2015-03-17 MED ORDER — MUPIROCIN 2 % EX OINT: 1.0000 "application " | TOPICAL_OINTMENT | Freq: Two times a day (BID) | CUTANEOUS | Status: DC

## 2015-03-17 MED ORDER — ONDANSETRON HCL 4 MG/2ML IJ SOLN
4.0000 mg | Freq: Four times a day (QID) | INTRAMUSCULAR | Status: DC | PRN
  Administered 2015-03-17 – 2015-03-18 (×2): 4 mg via INTRAVENOUS
  Filled 2015-03-17 (×2): qty 2

## 2015-03-17 MED ORDER — FENTANYL CITRATE (PF) 100 MCG/2ML IJ SOLN
INTRAMUSCULAR | Status: DC | PRN
  Administered 2015-03-17: 150 ug via INTRAVENOUS
  Administered 2015-03-17 (×2): 50 ug via INTRAVENOUS
  Administered 2015-03-17: 100 ug via INTRAVENOUS
  Administered 2015-03-17: 50 ug via INTRAVENOUS
  Administered 2015-03-17: 100 ug via INTRAVENOUS

## 2015-03-17 MED ORDER — HYDROMORPHONE HCL 1 MG/ML IJ SOLN
INTRAMUSCULAR | Status: AC
  Filled 2015-03-17: qty 1

## 2015-03-17 MED ORDER — PANTOPRAZOLE SODIUM 40 MG PO TBEC
40.0000 mg | DELAYED_RELEASE_TABLET | Freq: Every day | ORAL | Status: DC
  Administered 2015-03-18 – 2015-03-19 (×2): 40 mg via ORAL
  Filled 2015-03-17 (×3): qty 1

## 2015-03-17 MED ORDER — METOPROLOL TARTRATE 1 MG/ML IV SOLN: 2.0000 mg | INTRAVENOUS | Status: DC | PRN

## 2015-03-17 MED ORDER — OXYCODONE HCL 5 MG PO TABS: 5.0000 mg | ORAL_TABLET | Freq: Once | ORAL | Status: DC | PRN

## 2015-03-17 MED ORDER — OXYCODONE HCL 5 MG/5ML PO SOLN: 5.0000 mg | Freq: Once | ORAL | Status: DC | PRN

## 2015-03-17 MED ORDER — LORATADINE 10 MG PO TABS
10.0000 mg | ORAL_TABLET | Freq: Every day | ORAL | Status: DC
  Administered 2015-03-17 – 2015-03-19 (×3): 10 mg via ORAL
  Filled 2015-03-17 (×3): qty 1

## 2015-03-17 MED ORDER — MIDAZOLAM HCL 2 MG/2ML IJ SOLN
INTRAMUSCULAR | Status: AC
  Filled 2015-03-17: qty 4

## 2015-03-17 MED ORDER — LABETALOL HCL 5 MG/ML IV SOLN: 10.0000 mg | INTRAVENOUS | Status: DC | PRN

## 2015-03-17 MED ORDER — TRAZODONE HCL 50 MG PO TABS
50.0000 mg | ORAL_TABLET | Freq: Every day | ORAL | Status: DC
  Administered 2015-03-17 – 2015-03-18 (×2): 50 mg via ORAL
  Filled 2015-03-17 (×3): qty 1

## 2015-03-17 MED ORDER — HYDROMORPHONE HCL 1 MG/ML IJ SOLN
INTRAMUSCULAR | Status: DC | PRN
  Administered 2015-03-17 (×4): 0.5 mg via INTRAVENOUS

## 2015-03-17 MED ORDER — CHLORHEXIDINE GLUCONATE CLOTH 2 % EX PADS
6.0000 | MEDICATED_PAD | Freq: Every day | CUTANEOUS | Status: DC
  Administered 2015-03-17 – 2015-03-18 (×2): 6 via TOPICAL

## 2015-03-17 MED ORDER — THROMBIN 20000 UNITS EX SOLR
CUTANEOUS | Status: AC
  Filled 2015-03-17: qty 20000

## 2015-03-17 MED ORDER — SODIUM CHLORIDE 0.9 % IV SOLN: 500.0000 mL | Freq: Once | INTRAVENOUS | Status: DC | PRN

## 2015-03-17 MED ORDER — ENOXAPARIN SODIUM 40 MG/0.4ML ~~LOC~~ SOLN
40.0000 mg | SUBCUTANEOUS | Status: DC
Start: 2015-03-18 — End: 2015-03-19
  Administered 2015-03-18 – 2015-03-19 (×2): 40 mg via SUBCUTANEOUS
  Filled 2015-03-17 (×3): qty 0.4

## 2015-03-17 MED ORDER — ALUM & MAG HYDROXIDE-SIMETH 200-200-20 MG/5ML PO SUSP: 15.0000 mL | ORAL | Status: DC | PRN

## 2015-03-17 MED ORDER — PROPOFOL 10 MG/ML IV BOLUS: INTRAVENOUS | Status: DC | PRN

## 2015-03-17 MED ORDER — PROPOFOL 10 MG/ML IV BOLUS
INTRAVENOUS | Status: AC
  Filled 2015-03-17: qty 20

## 2015-03-17 MED ORDER — ESMOLOL HCL 10 MG/ML IV SOLN: INTRAVENOUS | Status: DC | PRN

## 2015-03-17 MED ORDER — MORPHINE SULFATE (PF) 2 MG/ML IV SOLN: 2.0000 mg | INTRAVENOUS | Status: DC | PRN

## 2015-03-17 MED ORDER — HYDROMORPHONE HCL 1 MG/ML IJ SOLN
0.2500 mg | INTRAMUSCULAR | Status: DC | PRN
  Administered 2015-03-17 (×2): 0.5 mg via INTRAVENOUS

## 2015-03-17 MED ORDER — SODIUM CHLORIDE 0.9 % IV SOLN
INTRAVENOUS | Status: DC | PRN
  Administered 2015-03-17: 500 mL

## 2015-03-17 MED ORDER — MAGNESIUM SULFATE 2 GM/50ML IV SOLN
2.0000 g | Freq: Every day | INTRAVENOUS | Status: DC | PRN
  Filled 2015-03-17: qty 50

## 2015-03-17 MED ORDER — 0.9 % SODIUM CHLORIDE (POUR BTL) OPTIME
TOPICAL | Status: DC | PRN
  Administered 2015-03-17: 3000 mL

## 2015-03-17 MED ORDER — ROCURONIUM BROMIDE 100 MG/10ML IV SOLN
INTRAVENOUS | Status: DC | PRN
  Administered 2015-03-17: 40 mg via INTRAVENOUS

## 2015-03-17 MED ORDER — ACETAMINOPHEN 325 MG PO TABS
650.0000 mg | ORAL_TABLET | Freq: Four times a day (QID) | ORAL | Status: DC
  Administered 2015-03-17 – 2015-03-18 (×2): 650 mg via ORAL
  Filled 2015-03-17 (×3): qty 2

## 2015-03-17 MED ORDER — PROPOFOL 10 MG/ML IV BOLUS
INTRAVENOUS | Status: DC | PRN
  Administered 2015-03-17: 200 mg via INTRAVENOUS

## 2015-03-17 MED ORDER — DEXAMETHASONE SODIUM PHOSPHATE 4 MG/ML IJ SOLN
INTRAMUSCULAR | Status: DC | PRN
  Administered 2015-03-17: 4 mg via INTRAVENOUS

## 2015-03-17 MED ORDER — EZETIMIBE 10 MG PO TABS
10.0000 mg | ORAL_TABLET | Freq: Every day | ORAL | Status: DC
  Administered 2015-03-17 – 2015-03-19 (×3): 10 mg via ORAL
  Filled 2015-03-17 (×3): qty 1

## 2015-03-17 MED ORDER — ROCURONIUM BROMIDE 100 MG/10ML IV SOLN: INTRAVENOUS | Status: DC | PRN

## 2015-03-17 MED ORDER — OXYCODONE-ACETAMINOPHEN 5-325 MG PO TABS
1.0000 | ORAL_TABLET | ORAL | Status: DC | PRN
  Administered 2015-03-18: 2 via ORAL
  Administered 2015-03-18 (×3): 1 via ORAL
  Administered 2015-03-18 – 2015-03-19 (×2): 2 via ORAL
  Filled 2015-03-17: qty 1
  Filled 2015-03-17 (×2): qty 2
  Filled 2015-03-17 (×2): qty 1
  Filled 2015-03-17: qty 2

## 2015-03-17 MED ORDER — IOHEXOL 300 MG/ML  SOLN
INTRAMUSCULAR | Status: DC | PRN
  Administered 2015-03-17: 50 mL via INTRAVENOUS

## 2015-03-17 MED ORDER — BISACODYL 5 MG PO TBEC: 5.0000 mg | DELAYED_RELEASE_TABLET | Freq: Every day | ORAL | Status: DC | PRN

## 2015-03-17 MED ORDER — ESMOLOL HCL 10 MG/ML IV SOLN
INTRAVENOUS | Status: DC | PRN
  Administered 2015-03-17: 30 mg via INTRAVENOUS

## 2015-03-17 MED ORDER — LIDOCAINE HCL (CARDIAC) 20 MG/ML IV SOLN
INTRAVENOUS | Status: DC | PRN
  Administered 2015-03-17: 50 mg via INTRAVENOUS

## 2015-03-17 MED ORDER — VASOPRESSIN 20 UNIT/ML IV SOLN
INTRAVENOUS | Status: DC | PRN
  Administered 2015-03-17 (×2): 2 [IU] via INTRAVENOUS
  Administered 2015-03-17: 1 [IU] via INTRAVENOUS

## 2015-03-17 MED ORDER — ACETAMINOPHEN 325 MG PO TABS: 325.0000 mg | ORAL_TABLET | ORAL | Status: DC | PRN

## 2015-03-17 MED ORDER — NITROGLYCERIN 0.4 MG SL SUBL: 0.4000 mg | SUBLINGUAL_TABLET | SUBLINGUAL | Status: DC | PRN

## 2015-03-17 MED ORDER — THROMBIN 20000 UNITS EX SOLR
CUTANEOUS | Status: DC | PRN
  Administered 2015-03-17: 14:00:00 via TOPICAL

## 2015-03-17 MED ORDER — DEXTROSE 5 % IV SOLN
1.5000 g | INTRAVENOUS | Status: AC
  Administered 2015-03-17 (×2): 1.5 g via INTRAVENOUS

## 2015-03-17 MED ORDER — PAPAVERINE HCL 30 MG/ML IJ SOLN
INTRAMUSCULAR | Status: AC
  Filled 2015-03-17: qty 2

## 2015-03-17 MED ORDER — ACETAMINOPHEN 650 MG RE SUPP: 325.0000 mg | RECTAL | Status: DC | PRN

## 2015-03-17 MED ORDER — MIDAZOLAM HCL 5 MG/5ML IJ SOLN
INTRAMUSCULAR | Status: DC | PRN
  Administered 2015-03-17: 2 mg via INTRAVENOUS

## 2015-03-17 MED ORDER — PHENOL 1.4 % MT LIQD: 1.0000 | OROMUCOSAL | Status: DC | PRN

## 2015-03-17 MED ORDER — VANCOMYCIN HCL IN DEXTROSE 1-5 GM/200ML-% IV SOLN
INTRAVENOUS | Status: AC
  Filled 2015-03-17: qty 200

## 2015-03-17 MED ORDER — FAMOTIDINE 20 MG PO TABS
20.0000 mg | ORAL_TABLET | Freq: Every day | ORAL | Status: DC
  Administered 2015-03-17 – 2015-03-19 (×3): 20 mg via ORAL
  Filled 2015-03-17 (×3): qty 1

## 2015-03-17 MED ORDER — LABETALOL HCL 5 MG/ML IV SOLN
INTRAVENOUS | Status: DC | PRN
  Administered 2015-03-17: 10 mg via INTRAVENOUS

## 2015-03-17 MED ORDER — HYDRALAZINE HCL 20 MG/ML IJ SOLN: 5.0000 mg | INTRAMUSCULAR | Status: DC | PRN

## 2015-03-17 MED ORDER — CALCITRIOL 0.5 MCG PO CAPS
0.5000 ug | ORAL_CAPSULE | Freq: Every day | ORAL | Status: DC
  Administered 2015-03-17 – 2015-03-19 (×3): 0.5 ug via ORAL
  Filled 2015-03-17 (×3): qty 1

## 2015-03-17 MED ORDER — PROMETHAZINE HCL 25 MG/ML IJ SOLN: 6.2500 mg | INTRAMUSCULAR | Status: DC | PRN

## 2015-03-17 MED ORDER — VANCOMYCIN HCL 1000 MG IV SOLR
750.0000 mg | INTRAVENOUS | Status: DC | PRN
  Administered 2015-03-17: 750 mg via INTRAVENOUS

## 2015-03-17 MED ORDER — DEXTROSE 5 % IV SOLN
INTRAVENOUS | Status: AC
  Filled 2015-03-17: qty 1.5

## 2015-03-17 MED ORDER — DOCUSATE SODIUM 100 MG PO CAPS
100.0000 mg | ORAL_CAPSULE | Freq: Every day | ORAL | Status: DC
  Administered 2015-03-18 – 2015-03-19 (×2): 100 mg via ORAL
  Filled 2015-03-17 (×2): qty 1

## 2015-03-17 MED ORDER — PROMETHAZINE HCL 25 MG/ML IJ SOLN
25.0000 mg | Freq: Four times a day (QID) | INTRAMUSCULAR | Status: DC | PRN
  Administered 2015-03-17: 25 mg via INTRAVENOUS
  Filled 2015-03-17: qty 1

## 2015-03-17 MED ORDER — LOSARTAN POTASSIUM 50 MG PO TABS
100.0000 mg | ORAL_TABLET | Freq: Every day | ORAL | Status: DC
  Administered 2015-03-17 – 2015-03-19 (×3): 100 mg via ORAL
  Filled 2015-03-17 (×3): qty 2

## 2015-03-17 MED ORDER — SODIUM CHLORIDE 0.9 % IV SOLN: INTRAVENOUS | Status: DC

## 2015-03-17 SURGICAL SUPPLY — 61 items
BANDAGE ESMARK 6X9 LF (GAUZE/BANDAGES/DRESSINGS) IMPLANT
BNDG CMPR 9X6 STRL LF SNTH (GAUZE/BANDAGES/DRESSINGS)
BNDG ESMARK 6X9 LF (GAUZE/BANDAGES/DRESSINGS)
CANISTER SUCTION 2500CC (MISCELLANEOUS) ×4 IMPLANT
CANNULA VESSEL 3MM 2 BLNT TIP (CANNULA) ×4 IMPLANT
CLIP TI MEDIUM 24 (CLIP) ×4 IMPLANT
CLIP TI WIDE RED SMALL 24 (CLIP) ×5 IMPLANT
CUFF TOURNIQUET SINGLE 24IN (TOURNIQUET CUFF) IMPLANT
CUFF TOURNIQUET SINGLE 34IN LL (TOURNIQUET CUFF) IMPLANT
CUFF TOURNIQUET SINGLE 44IN (TOURNIQUET CUFF) IMPLANT
DRAIN SNY WOU (WOUND CARE) IMPLANT
DRAPE PROXIMA HALF (DRAPES) IMPLANT
DRAPE X-RAY CASS 24X20 (DRAPES) ×2 IMPLANT
ELECT REM PT RETURN 9FT ADLT (ELECTROSURGICAL) ×4
ELECTRODE REM PT RTRN 9FT ADLT (ELECTROSURGICAL) ×3 IMPLANT
EVACUATOR SILICONE 100CC (DRAIN) IMPLANT
GAUZE SPONGE 4X4 16PLY XRAY LF (GAUZE/BANDAGES/DRESSINGS) ×2 IMPLANT
GLOVE BIO SURGEON STRL SZ7.5 (GLOVE) ×5 IMPLANT
GLOVE BIOGEL PI IND STRL 6.5 (GLOVE) IMPLANT
GLOVE BIOGEL PI IND STRL 7.0 (GLOVE) IMPLANT
GLOVE BIOGEL PI IND STRL 7.5 (GLOVE) IMPLANT
GLOVE BIOGEL PI IND STRL 8 (GLOVE) IMPLANT
GLOVE BIOGEL PI INDICATOR 6.5 (GLOVE) ×3
GLOVE BIOGEL PI INDICATOR 7.0 (GLOVE) ×5
GLOVE BIOGEL PI INDICATOR 7.5 (GLOVE) ×1
GLOVE BIOGEL PI INDICATOR 8 (GLOVE) ×2
GLOVE ECLIPSE 6.5 STRL STRAW (GLOVE) ×4 IMPLANT
GLOVE ECLIPSE 7.0 STRL STRAW (GLOVE) ×2 IMPLANT
GOWN STRL REUS W/ TWL LRG LVL3 (GOWN DISPOSABLE) ×9 IMPLANT
GOWN STRL REUS W/TWL LRG LVL3 (GOWN DISPOSABLE) ×24
KIT BASIN OR (CUSTOM PROCEDURE TRAY) ×4 IMPLANT
KIT ROOM TURNOVER OR (KITS) ×4 IMPLANT
LIQUID BAND (GAUZE/BANDAGES/DRESSINGS) ×4 IMPLANT
LOOP VESSEL MINI RED (MISCELLANEOUS) ×1 IMPLANT
NS IRRIG 1000ML POUR BTL (IV SOLUTION) ×9 IMPLANT
PACK PERIPHERAL VASCULAR (CUSTOM PROCEDURE TRAY) ×4 IMPLANT
PAD ARMBOARD 7.5X6 YLW CONV (MISCELLANEOUS) ×8 IMPLANT
PADDING CAST COTTON 6X4 STRL (CAST SUPPLIES) IMPLANT
SET COLLECT BLD 21X3/4 12 (NEEDLE) IMPLANT
SET COLLECT BLD 21X3/4 12 PB (MISCELLANEOUS) ×1 IMPLANT
SPONGE SURGIFOAM ABS GEL 100 (HEMOSTASIS) ×1 IMPLANT
STAPLER VISISTAT 35W (STAPLE) IMPLANT
STOPCOCK 4 WAY LG BORE MALE ST (IV SETS) ×1 IMPLANT
SUT PROLENE 5 0 C 1 24 (SUTURE) ×4 IMPLANT
SUT PROLENE 6 0 BV (SUTURE) ×5 IMPLANT
SUT PROLENE 6 0 CC (SUTURE) ×7 IMPLANT
SUT PROLENE 7 0 BV 1 (SUTURE) ×2 IMPLANT
SUT PROLENE 7 0 BV1 MDA (SUTURE) IMPLANT
SUT SILK 2 0 SH (SUTURE) ×4 IMPLANT
SUT SILK 3 0 (SUTURE) ×12
SUT SILK 3-0 18XBRD TIE 12 (SUTURE) IMPLANT
SUT VIC AB 2-0 CTX 36 (SUTURE) ×8 IMPLANT
SUT VIC AB 3-0 SH 27 (SUTURE) ×24
SUT VIC AB 3-0 SH 27X BRD (SUTURE) ×6 IMPLANT
SUT VIC AB 4-0 PS2 27 (SUTURE) ×8 IMPLANT
SUT VICRYL 4-0 PS2 18IN ABS (SUTURE) ×3 IMPLANT
TAPE UMBILICAL COTTON 1/8X30 (MISCELLANEOUS) IMPLANT
TRAY FOLEY W/METER SILVER 16FR (SET/KITS/TRAYS/PACK) ×4 IMPLANT
TUBING EXTENTION W/L.L. (IV SETS) ×1 IMPLANT
UNDERPAD 30X30 INCONTINENT (UNDERPADS AND DIAPERS) ×4 IMPLANT
WATER STERILE IRR 1000ML POUR (IV SOLUTION) ×4 IMPLANT

## 2015-03-17 NOTE — Op Note (Signed)
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 The patient was placed in supine position on the operating room table. After induction of general anesthesia and endotracheal intubation, a Foley catheter was placed. Next, the patient's entire left lower extremity was prepped and draped in the usual sterile fashion. A longitudinal incision was then made in the left groin and carried down through the subcutaneous tissues to expose the left common femoral artery.  The patient had a prior left-sided catheterization so there was some scar which made dissection fairly tedious.  The femoral vein was adherent to the medial side of the artery.  The was dense scar at the femoral bifurcation.  The profunda and superficial femoral artery were both dissected free circumferentially and vessel loops placed around these.  The common femoral artery was dissected free circumferentially. There was a pulse within the common femoral artery. There was a large plaque at the femoral bifurcation obstructing almost 80% of the lumen. The distal external iliac artery was dissected free circumferentially underneath the inguinal ligament.  A vessel loop was also placed around the distal external iliac artery.   Next the saphenofemoral junction was identified in the medial portion of the groin incision and this was harvested through several skip  incisions on the medial aspect of the leg.  Side branches were ligated and divided between silk ties or clips.  The vein was of good quality 3 mm diameter  The vein harvest incision was deepened into the fascia at the below knee segment and the below knee popliteal space was entered.  The popliteal artery was dissected free circumferentially. This appeared fairly small. However, it did seem to be in the anatomic location of the below-knee popliteal artery..  A tunnel was then created between the heads of the gastrocnemius muscle subsartorial up to the groin.  The vein was ligated distally and at the saphenofemoral junction with a 2 0 silk tie.  The vein was gently distended with heparinized saline and inspected for hemostasis.    The patient was given 10000 units of heparin.  The patient was given 2 additional boluses of heparin during the course of the case. After appropriate circulation time, the distal left external iliac artery was controlled with a vessel loop. The profunda and SFA were also controlled with vessel loops.   A longitudinal opening was made in the common femoral artery on its anterior surface. There was a tandem lesion of calcified exophytic plaque obstructing nearly all the femoral bifurcation. A femoral endarterectomy was performed of this area. A good proximal and distal endpoint was obtained. This did require extending the arteriotomy down into the origins of the superficial femoral and profunda femoris arteries to obtain a good endpoint. An additional segment of vein was then harvested from the mid thigh. This was opened longitudinally and placed in a reversed configuration. This was then sewn on as a patch angioplasty with a tongue of vein extending down the profunda and the SFA using a running 6-0 Prolene suture. The vessel  loops were then released and there was good pulsatile flow in the proximal SFA and profunda immediately. There was good Doppler flow in the segments as well. The  vein was placed in a non reversed configuration.  A longitudinal opening was made in the vein patch. The arteriotomy was extended with Pott's scissors.  The vein was spatulated and sewn end to side to the vein patch using a running 6 0 Prolene.  Just prior to completion of the anastomosis everything was forebled backbled and thoroughly flushed. Proximal clamp and distal clamps were removed and there was good pulsatile flow in the profunda femoris artery immediately. All the valves were then lysed with a valvulotome.  The graft was then brought through the subsartorial tunnel down to the below-knee popliteal artery after marking for orientation. The below-knee popliteal artery was controlled proximally and distally with a Henley clamp. A longitudinal opening was made in the distal below-knee popliteal artery in an area that was fairly free of calcification. The graft was then cut to length and spatulated and sewn end of graft to side of artery using running 6-0 Prolene suture.  At completion of the anastomosis everything was forebled backbled and thoroughly flushed. The remainder of the anastomosis was completed and all clamps were removed restoring pulsatile flow to the below-knee popliteal artery. An intraoperative arteriogram was then obtained. This was done by placing a 21-gauge butterfly needle in the proximal aspect of the vein graft. This was done with inflow occlusion. This showed a widely patent distal anastomosis. However this was to the proximal posterior tibial artery which had an aberrant takeoff high in the knee joint. At this point I decided that I also wanted to revascularize the below-knee popliteal artery. A remaining segment of vein was brought up in the operative field and reversed. The below-knee popliteal artery was dissected free circumferentially just deep to the previous dissection. The popliteal artery was almost adjacent to the posterior tibial artery. The below-knee popliteal artery was  controlled proximally and distally with fine bulldog clamps. A longitudinal opening was made in the below-knee popliteal artery and the vein spatulated and sewn end of vein to side of artery using a running 6-0 Prolene suture. At completion anastomosis was forebled backbled and thoroughly flushed. The anastomosis was secured clamps released there is good backbleeding from the popliteal artery. The vein graft was then controlled and a longitudinal opening made in the side of the vein graft and the vein graft then sewn end of vein to side of the pre-existing vein graft using a running 6-0 Prolene suture. Just prior completion anastomosis this was forebled backbled and thoroughly flushed.  The patient had monophasic to biphasic Doppler flow in the posterior tibial and dorsalis pedis areas of the foot. He also had a palpable posterior tibial pulse.  One repair stitch was placed in the lateral wall of the proximal anastomosis and at the medial heel of the distal anastomosis.   An additional arteriogram was performed through the pre-existing catheter. This showed patent distal anastomosis to the popliteal and posterior tibial arteries. The heparin was partially reversed with protamine.  After hemostasis was obtained, the deep layers and subcutaneous layers of the below-knee popliteal incision were closed with running 3-0 Vicryl suture. The skin was closed with a 4-0 Vicryl subcuticular stitch..   The saphenectomy incisions were closed with running 3 0 vicryl follow by orotracheal Vicryl subcuticular stitch..  The groin was inspected and found to be hemostatic. This was then closed in multiple  layers of running 2 0 and 3-0 Vicryl suture and 4-0 subcuticular stitch. The patient tolerated the procedure well and there were no complications. Instrument sponge and needle counts correct in the case. Patient was taken to the recovery in stable condition.  Fabienne Bruns, MD [facility]  [place] Office: [phone] Pager: [phone]

## 2015-03-17 NOTE — Anesthesia Procedure Notes (Signed)
Procedure Name: Intubation Performed by: Marcene DuosFITZGERALD, Kylee Umana Pre-anesthesia Checklist: Patient identified, Emergency Drugs available, Suction available and Patient being monitored Patient Re-evaluated:Patient Re-evaluated prior to inductionOxygen Delivery Method: Circle system utilized and Simple face mask Preoxygenation: Pre-oxygenation with 100% oxygen Intubation Type: IV induction Ventilation: Mask ventilation without difficulty Laryngoscope Size: Glidescope Grade View: Grade I Tube type: Oral Tube size: 7.5 mm Number of attempts: 1 Airway Equipment and Method: Stylet Placement Confirmation: ETT inserted through vocal cords under direct vision,  positive ETCO2 and breath sounds checked- equal and bilateral Secured at: 22 cm Tube secured with: Tape Dental Injury: Teeth and Oropharynx as per pre-operative assessment

## 2015-03-17 NOTE — Transfer of Care (Signed)
Immediate Anesthesia Transfer of Care Note  Patient: Derrick [name]  Procedure(s) Performed: Procedure(s): BYPASS GRAFT FEMORAL-POPLITEAL ARTERY-LEFT LEG AND POSTERIOR TIBIAL SEQUENTIAL GRAFT TO BELOW KNEE POPLITEAL ARTERY (Left) ENDARTERECTOMY COMMON FEMORAL WITH PROFUNDOPLASTY (Left) VEIN PATCH ANGIOPLASTY COMMON FEMORAL ARTERY (Left) INTRA OPERATIVE ARTERIOGRAM X2 (Left)  Patient Location: PACU  Anesthesia Type:General  Level of Consciousness: awake, oriented, sedated and patient cooperative  Airway & Oxygen Therapy: Patient Spontanous Breathing and Patient connected to face mask oxygen  Post-op Assessment: Report given to RN, Post -op Vital signs reviewed and stable, Patient moving all extremities and Patient able to stick tongue midline  Post vital signs: Reviewed and stable  Last Vitals:  Filed Vitals:   03/17/15 0546  BP: 155/76  Pulse: 59  Temp: 36.6 C    Complications: No apparent anesthesia complications

## 2015-03-17 NOTE — Interval H&P Note (Signed)
History and Physical Interval Note:  03/17/2015 7:26 AM  Derrick AllegraSteven R Kilner  has presented today for surgery, with the diagnosis of Peripheral vascular disease with bilateral lower extremity claudication I70.213  The various methods of treatment have been discussed with the patient and family. After consideration of risks, benefits and other options for treatment, the patient has consented to  Procedure(s): BYPASS GRAFT FEMORAL-POPLITEAL ARTERY (Left) as a surgical intervention .  The patient's history has been reviewed, patient examined, no change in status, stable for surgery.  I have reviewed the patient's chart and labs.  Questions were answered to the patient's satisfaction.     Fabienne BrunsFields, Emalynn Clewis

## 2015-03-17 NOTE — Progress Notes (Signed)
      Patient comfortable Palpable DP/PT left LE Incisions clean and dry  S/P left fem-pop  Disposition stable  Hassel Uphoff MAUREEN PA-C

## 2015-03-17 NOTE — H&P (View-Only) (Signed)
[facility] [place] HISTORY AND PHYSICAL    History of Present Illness:  Patient is a 59 y.o. year old male who presents for evaluation of bilateral leg pain. He underwent bilateral common iliac artery stenting several weeks ago. He states he has not noticed any difference in his claudication symptoms. The patient complains of a burning sensation on the right anterior thigh in the dorsum of both feet. This occurs intermittently. He also complains of pain in both calves left greater than right after walking 100 feet. The pain in the calf resolves fairly quickly by resting. It returns pretty consistently.  He denies rest pain. He denies any nonhealing wounds. He is a former smoker quitting in 1991. The problem has been slowly progressive over the last 2 years. He is having difficulty at work because he can't get around very well.  He has also had an MRI of the lumbar spine which showed some degenerative joint disease..  Other medical problems include coronary artery disease, hyperlipidemia (intolerant to statins), degenerative joint disease all of which are currently stable. He did have an episode of renal insufficiency in the past and was documented as CK D3. The patient states this was related to meloxicam and his kidney function is now normal.    Past Medical History   Diagnosis  Date   .  Hyperparathyroidism     .  Hypertension     .  Dyslipidemia     .  OA (osteoarthritis)     .  Metatarsalgia     .  Hyperlipidemia         statin intolerant   .  GERD (gastroesophageal reflux disease)     .  Coronary artery disease  2006       stents x2=LAD LCX; totally occluded RCA   .  Seasonal allergies     .  Insomnia     .  Peripheral vascular disease         Past Surgical History   Procedure  Laterality  Date   .  Cervical spine surgery    2004   .  Cardiac catheterization    10/28/2004       circ and mid LAD chyper stents   .  Cervical fusion    [id number]   .  Incision and  drainage abscess posterior cervicalspine    2009   .  Elbow arthroplasty    1994       right   .  Colonoscopy       .  Shoulder open rotator cuff repair  Right  04/23/2013       Procedure: RIGHT TWO TENDON ROTATOR CUFF REPAIR  SUBACROMIAL DECOMPRESSION AND OPEN DISTAL CLAVICLE RESECTION;  Surgeon: Wyn Forsterobert V Sypher Jr., MD;  Location: [facility];  Service: Orthopedics;  Laterality: Right;     Social History Social History   Substance Use Topics   .  Smoking status:  Former Smoker       Types:  Cigarettes       Quit date:  05/30/1989   .  Smokeless tobacco:  Never Used   .  Alcohol Use:  Yes         Comment: one drink a month     Family History Family History   Problem  Relation  Age of Onset   .  Arthritis  Mother     .  Varicose Veins  Mother     .  Lung  cancer  Father     .  COPD  Father         Lung   .  Cancer  Brother         Lukemia   .  COPD  Brother     .  Cancer  Brother         Prostate     Allergies    Allergies   Allergen  Reactions   .  Atorvastatin         Muscle aches   .  Codeine  Itching   .  Crestor [Rosuvastatin Calcium]         Muscle aches        Current Outpatient Prescriptions   Medication  Sig  Dispense  Refill   .  acetaminophen (TYLENOL ARTHRITIS PAIN) 650 MG CR tablet  2 (two) times daily. Take 1 tab twice a day       .  aspirin 81 MG tablet  Take 1 tablet (81 mg total) by mouth daily.  30 tablet  6   .  calcitRIOL (ROCALTROL) 0.5 MCG capsule  Take 0.5 mcg by mouth daily.       .  calcium-vitamin D (OSCAL WITH D) 500-200 MG-UNIT per tablet  Take 2 tablets by mouth. Take 2 tabs Am and 1 tab Pm       .  ezetimibe (ZETIA) 10 MG tablet  Take 10 mg by mouth daily.         .  fenofibrate 160 MG tablet  Take 160 mg by mouth daily.       .  fish oil-omega-3 fatty acids 1000 MG capsule  Take 4,800 mg by mouth daily. Take 2 tabs. Am and 2 tabs Pm       .  glucosamine-chondroitin 500-400 MG tablet  Take 1 tablet by mouth. 2 Tabs  twice daily       .  losartan (COZAAR) 100 MG tablet  daily.       .  ranitidine (ZANTAC) 150 MG tablet  Take 150 mg by mouth 2 (two) times daily.       .  traZODone (DESYREL) 50 MG tablet  Take 50 mg by mouth at bedtime.       .  fexofenadine (ALLEGRA) 180 MG tablet  Take 180 mg by mouth daily.       Marland Kitchen  losartan (COZAAR) 50 MG tablet  Take 100 mg by mouth daily.        .  nitroGLYCERIN (NITROSTAT) 0.4 MG SL tablet  Place 1 tablet (0.4 mg total) under the tongue every 5 (five) minutes as needed for chest pain.  25 tablet  12      No current facility-administered medications for this visit.     ROS:    General:  No weight loss, Fever, chills  HEENT: No recent headaches, no nasal bleeding, no visual changes, no sore throat  Neurologic: No dizziness, blackouts, seizures. No recent symptoms of stroke or mini- stroke. No recent episodes of slurred speech, or temporary blindness.  Cardiac: No recent episodes of chest pain/pressure, no shortness of breath at rest.  No shortness of breath with exertion.  Denies history of atrial fibrillation or irregular heartbeat  Vascular: No history of rest pain in feet.  + history of claudication.  No history of non-healing ulcer, No history of DVT    Pulmonary: No home oxygen, no productive cough, no hemoptysis,  No asthma or wheezing  Musculoskeletal:  [  x ] Arthritis, [x ] Low back pain,   Joint pain  Hematologic:No history of hypercoagulable state.  No history of easy bleeding.  No history of anemia  Gastrointestinal: No hematochezia or melena,  No gastroesophageal reflux, no trouble swallowing  Urinary: [x ] chronic Kidney disease,  on HD -  MWF or  TTHS,  Burning with urination,  Frequent urination,  Difficulty urinating;    Skin: No rashes  Psychological: No history of anxiety,  No history of depression   Physical Examination    Filed Vitals:   02/26/15 1404  BP: 157/62  Pulse: 64  Temp: 97.9 F (36.6 C)  TempSrc:  Oral  Height:  (1.88 m)  Weight: 211 lb (95.709 kg)  SpO2: 100%    General:  Alert and oriented, no acute distress HEENT: Normal Extremity Pulses:  2+ radial, brachial, femoral, absent popliteal dorsalis pedis, posterior tibial pulses bilaterally Musculoskeletal: No deformity or edema     Neurologic: Upper and lower extremity motor 5/5 and symmetric  DATA:  Patient had bilateral ABIs performed which were 0.78 on the right 0.6 on the left which is unchanged from prior to his iliac stenting procedure.  ASSESSMENT:  Bilateral lower extremity claudication secondary to superficial femoral artery occlusive disease. Most likely the pain in his feet and numbness in his thigh or unrelated problems related to degenerative joint disease   PLAN:  Had a lengthy discussion with the patient today regarding the natural history of peripheral arterial disease. He still has claudication symptoms after his bilateral iliac stents. He has bilateral superficial femoral artery occlusive disease which is not amenable to stenting. Told him that his lifetime risk of amputation is less than 5% as long as he continues to refrain from smoking. I discussed with him today the possibility of conservative management with a walking program of daily walks of 30 minutes to try to increase collateralization and muscle tolerance. However he states that basically he is artery doing this at work and he feels like his quality of life is severely diminished in his current status.  He has opted at this point for bilateral staged femoral-popliteal bypasses. We will have him evaluated by Dr. Elease Hashimoto for cardiac risk stratification. If his cardiac status is reasonable we will schedule him first for a left femoropopliteal bypass followed subsequently by right femoropopliteal bypass after he recovers from this.  Fabienne Bruns, MD [facility] [place] Office: [phone] Pager: [phone]

## 2015-03-17 NOTE — Anesthesia Preprocedure Evaluation (Addendum)
Anesthesia Evaluation  Patient identified by MRN, date of birth, ID band Patient awake    Reviewed: Allergy & Precautions, NPO status , Patient's Chart, lab work & pertinent test results  Airway Mallampati: II  TM Distance: >3 FB Neck ROM: Full    Dental  (+) Dental Advisory Given   Pulmonary former smoker,    breath sounds clear to auscultation       Cardiovascular Exercise Tolerance: Poor hypertension, Pt. on medications and On Home Beta Blockers + CAD, + Cardiac Stents (LAD and LCx in 2006 stopped plavix 03/12/2015), + Peripheral Vascular Disease and + DOE   Rhythm:Regular Rate:Bradycardia     Neuro/Psych negative neurological ROS     GI/Hepatic Neg liver ROS, GERD  ,  Endo/Other  negative endocrine ROS  Renal/GU CRFRenal disease     Musculoskeletal  (+) Arthritis ,   Abdominal   Peds  Hematology  (+) anemia ,   Anesthesia Other Findings   Reproductive/Obstetrics                           Lab Results  Component Value Date   WBC 6.1 03/12/2015   HGB 12.9* 03/12/2015   HCT 38.1* 03/12/2015   MCV 88.0 03/12/2015   PLT 310 03/12/2015   Lab Results  Component Value Date   CREATININE 1.45* 03/12/2015   BUN 18 03/12/2015   NA 140 03/12/2015   K 4.1 03/12/2015   CL 105 03/12/2015   CO2 26 03/12/2015    Anesthesia Physical Anesthesia Plan  ASA: III  Anesthesia Plan: General   Post-op Pain Management:    Induction: Intravenous  Airway Management Planned: Oral ETT  Additional Equipment:   Intra-op Plan:   Post-operative Plan: Extubation in OR  Informed Consent: I have reviewed the patients History and Physical, chart, labs and discussed the procedure including the risks, benefits and alternatives for the proposed anesthesia with the patient or authorized representative who has indicated his/her understanding and acceptance.   Dental advisory given  Plan Discussed  with: CRNA  Anesthesia Plan Comments:         Anesthesia Quick Evaluation

## 2015-03-17 NOTE — Progress Notes (Signed)
UR COMPLETED  

## 2015-03-18 ENCOUNTER — Encounter (HOSPITAL_COMMUNITY): Payer: Self-pay | Admitting: Vascular Surgery

## 2015-03-18 LAB — BASIC METABOLIC PANEL
Anion gap: 13 (ref 5–15)
BUN: 14 mg/dL (ref 6–20)
CO2: 25 mmol/L (ref 22–32)
Calcium: 8.3 mg/dL — ABNORMAL LOW (ref 8.9–10.3)
Chloride: 102 mmol/L (ref 101–111)
Creatinine, Ser: 1.36 mg/dL — ABNORMAL HIGH (ref 0.61–1.24)
GFR calc Af Amer: >60 mL/min (ref >60)
GFR calc non Af Amer: 55 mL/min — ABNORMAL LOW (ref >60)
Glucose, Bld: 132 mg/dL — ABNORMAL HIGH (ref 65–99)
Potassium: 3.9 mmol/L (ref 3.5–5.1)
Sodium: 140 mmol/L (ref 135–145)

## 2015-03-18 LAB — CBC
HCT: 30.2 % — ABNORMAL LOW (ref 39.0–52.0)
Hemoglobin: 9.9 g/dL — ABNORMAL LOW (ref 13.0–17.0)
MCH: 29.1 pg (ref 26.0–34.0)
MCHC: 32.8 g/dL (ref 30.0–36.0)
MCV: 88.8 fL (ref 78.0–100.0)
Platelets: 235 10*3/uL (ref 150–400)
RBC: 3.4 MIL/uL — ABNORMAL LOW (ref 4.22–5.81)
RDW: 12.9 % (ref 11.5–15.5)
WBC: 9.9 10*3/uL (ref 4.0–10.5)

## 2015-03-18 MED ORDER — INFLUENZA VAC SPLIT QUAD 0.5 ML IM SUSY
0.5000 mL | PREFILLED_SYRINGE | INTRAMUSCULAR | Status: AC
  Administered 2015-03-19: 0.5 mL via INTRAMUSCULAR
  Filled 2015-03-18: qty 0.5

## 2015-03-18 NOTE — Progress Notes (Signed)
[facility] [place]  Subjective  - leg sore   Objective 113/53 66 98.2 F (36.8 C) (Oral) 13 97%  Intake/Output Summary (Last 24 hours) at 03/18/15 0913 Last data filed at 03/18/15 0800  Gross per 24 hour  Intake   2760 ml  Output   3425 ml  Net   -665 ml   Incisions clean no hematoma Brisk triphasic DP doppler Biphasic PT doppler  Assessment/Planning: Transfer 2w Ambulate Home tomorrow or Friday  Fabienne BrunsFields, Charles 03/18/2015 9:13 AM --  Laboratory Lab Results:  Recent Labs  03/17/15 2037 03/18/15 0444  WBC 11.3* 9.9  HGB 10.3* 9.9*  HCT 31.4* 30.2*  PLT 232 235   BMET  Recent Labs  03/17/15 2037 03/18/15 0444  NA  --  140  K  --  3.9  CL  --  102  CO2  --  25  GLUCOSE  --  132*  BUN  --  14  CREATININE 1.45* 1.36*  CALCIUM  --  8.3*    COAG Lab Results  Component Value Date   INR 1.08 03/12/2015   INR 1.07 01/17/2015   INR 1.0 03/18/2008   No results found for: PTT

## 2015-03-18 NOTE — Evaluation (Signed)
Physical Therapy Evaluation Patient Details Name: Derrick [name] MRN: [id number] DOB: 09/05/1955 Today's Date: 03/18/2015   History of Present Illness  Pt is a 59 y/o M s/p fem pop bypass 2/2 claudication (Lt>Rt).  Pt's PMH includes OA, metatarsalgia, CAD, cervical fusion, Rt elbow arthroplasty, Rt rotator cuff repair.  Clinical Impression  Patient is s/p above surgery resulting in functional limitations due to the deficits listed below (see PT Problem List). Derrick [name] is ambulating and navigating steps at the supervision level and will have wife available 24/7 to assist at d/c if necessary (typically works during the day).  Patient will benefit from skilled PT to increase their independence and safety with mobility w/ eventual transition to use of cane vs. RW to allow discharge to the venue listed below.        Follow Up Recommendations Home health PT;Supervision for mobility/OOB    Equipment Recommendations  None recommended by PT    Recommendations for Other Services       Precautions / Restrictions Precautions Precautions: Fall Precaution Comments: Educated pt on importance of maintaining Lt knee extension when sitting or supine  Restrictions Weight Bearing Restrictions: No      Mobility  Bed Mobility               General bed mobility comments: Pt sitting in recliner chair upon PT arrival  Transfers Overall transfer level: Needs assistance Equipment used: Rolling walker (2 wheeled) Transfers: Sit to/from Stand Sit to Stand: Min assist         General transfer comment: Requires 2 attempts to stand from recliner chair 2/2 instability.  Min assist provided to stabilize RW and cues for hand placement.  Pt reports mild lightheadedness once standing that dissipates after ~1 minute.  Ambulation/Gait Ambulation/Gait assistance: Supervision Ambulation Distance (Feet): 200 Feet Assistive device: Rolling walker (2 wheeled) Gait Pattern/deviations: Step-to  pattern;Step-through pattern;Decreased stride length;Antalgic;Trunk flexed;Decreased weight shift to left;Decreased stance time - left   Gait velocity interpretation: Below normal speed for age/gender General Gait Details: Initially step to w/ emerging step through gait pattern.  Slight trunk flexion that improves following verbal cues.    Stairs Stairs: Yes Stairs assistance: Supervision Stair Management: Two rails;Step to pattern;Forwards Number of Stairs: 4 General stair comments: Cues for technique and assist w/ managing lines.  Otherwise, supervision for safety.    Wheelchair Mobility    Modified Rankin (Stroke Patients Only)       Balance Overall balance assessment: Needs assistance Sitting-balance support: No upper extremity supported;Feet supported Sitting balance-Leahy Scale: Good     Standing balance support: Bilateral upper extremity supported;During functional activity Standing balance-Leahy Scale: Fair                               Pertinent Vitals/Pain Pain Assessment: Faces Faces Pain Scale: Hurts little more Pain Location: Lt LE Pain Descriptors / Indicators: Sore;Tightness Pain Intervention(s): Limited activity within patient's tolerance;Monitored during session;Repositioned    Home Living Family/patient expects to be discharged to:: Private residence Living Arrangements: Spouse/significant other Available Help at Discharge: Family;Available 24 hours/day (wife works during day but able to assist 24/7 if necessary) Type of Home: House Home Access: Stairs to enter Entrance Stairs-Rails: Can reach both Entrance Stairs-Number of Steps: 3 Home Layout: One level Home Equipment: Walker - 2 wheels;Bedside commode;Crutches      Prior Function Level of Independence: Independent  Hand Dominance        Extremity/Trunk Assessment   Upper Extremity Assessment: Overall WFL for tasks assessed           Lower Extremity  Assessment: LLE deficits/detail;RLE deficits/detail RLE Deficits / Details: h/o claudication in Rt LE LLE Deficits / Details: weakness and limited ROM as expected s/p Lt fem pop bypass     Communication   Communication: No difficulties  Cognition Arousal/Alertness: Awake/alert Behavior During Therapy: WFL for tasks assessed/performed Overall Cognitive Status: Within Functional Limits for tasks assessed                      General Comments      Exercises General Exercises - Lower Extremity Ankle Circles/Pumps: AROM;Both;10 reps;Seated Long Arc Quad: AROM;Left;15 reps;Seated Hip Flexion/Marching: AROM;Both;10 reps;Seated      Assessment/Plan    PT Assessment Patient needs continued PT services  PT Diagnosis Difficulty walking;Abnormality of gait;Generalized weakness;Acute pain   PT Problem List Decreased strength;Decreased range of motion;Decreased activity tolerance;Decreased balance;Decreased mobility;Decreased knowledge of use of DME;Decreased safety awareness;Decreased knowledge of precautions;Decreased skin integrity;Pain  PT Treatment Interventions DME instruction;Gait training;Stair training;Functional mobility training;Therapeutic activities;Therapeutic exercise;Balance training;Neuromuscular re-education;Patient/family education;Modalities   PT Goals (Current goals can be found in the Care Plan section) Acute Rehab PT Goals Patient Stated Goal: to go home once ready PT Goal Formulation: With patient Time For Goal Achievement: 04/01/15 Potential to Achieve Goals: Good    Frequency Min 3X/week   Barriers to discharge Inaccessible home environment;Decreased caregiver support 3 steps to enter home.  Wife works during the day but can take time off if necessary to ensure pt's safety upon d/c    Co-evaluation               End of Session Equipment Utilized During Treatment: Gait belt Activity Tolerance: Patient tolerated treatment well Patient left: in  chair;with call bell/phone within reach Nurse Communication: Mobility status;Precautions         Time: 1610-9604 PT Time Calculation (min) (ACUTE ONLY): 28 min   Charges:   PT Evaluation $Initial PT Evaluation Tier I: 1 Procedure PT Treatments $Gait Training: 8-22 mins   PT G CodesMichail Jewels PT, DPT [phone] Pager: [phone] 03/18/2015, 9:58 AM

## 2015-03-18 NOTE — Progress Notes (Signed)
Patient from 3 S alert and oriented no complaints voiced at this time. Left leg incision without any signs of redness or drainage patient oriented to room and call bell , monitor applied by tech, safety measures walker at bedside

## 2015-03-18 NOTE — Care Management Note (Addendum)
Case Management Note  Patient Details  Name: Derrick [name] MRN: [id number] Date of Birth: 03/25/1956  Subjective/Objective:                 Admitted from home with wife s/p fem pop bypass. Independent with ADL's. Pt with cane and walker @ home.  Action/Plan: Return to home when medically stable.CM to f/u with disposition needs.  Expected Discharge Date:                  Expected Discharge Plan:  Home w Home Health Services  In-House Referral:     Discharge planning Services  CM Consult  Post Acute Care Choice:    Choice offered to:     DME Arranged:    DME Agency:     HH Arranged:  PT HH Agency:  [facility]  Status of Service:  In process, will continue to follow  Medicare Important Message Given:    Date Medicare IM Given:    Medicare IM give by:    Date Additional Medicare IM Given:    Additional Medicare Important Message give by:     If discussed at Long Length of Stay Meetings, dates discussed:    Additional Comments: CM spoke with pt regarding recommendations from PT: Home health PT;Supervision for mobility/OOB. Pt in agreement with hhpt. Choice given. Pt choices [facility] for HHPT. Referral made with Donna([facility]) @ [phone] for hhpt.    Gae GallopCole, [name] [name], ArizonaRN,BSN,CM   [phone] 03/18/2015, 10:17 AM

## 2015-03-18 NOTE — Progress Notes (Signed)
Report called, pt to transfer to [id number] via w/c with belongings.

## 2015-03-18 NOTE — Anesthesia Postprocedure Evaluation (Signed)
  Anesthesia Post-op Note  Patient: Derrick [name]  Procedure(s) Performed: Procedure(s): BYPASS GRAFT FEMORAL-POPLITEAL ARTERY-LEFT LEG AND POSTERIOR TIBIAL SEQUENTIAL GRAFT TO BELOW KNEE POPLITEAL ARTERY (Left) ENDARTERECTOMY COMMON FEMORAL WITH PROFUNDOPLASTY (Left) VEIN PATCH ANGIOPLASTY COMMON FEMORAL ARTERY (Left) INTRA OPERATIVE ARTERIOGRAM X2 (Left)  Patient Location: PACU  Anesthesia Type:General  Level of Consciousness: awake and alert   Airway and Oxygen Therapy: Patient Spontanous Breathing  Post-op Pain: mild  Post-op Assessment: Post-op Vital signs reviewed LLE Motor Response: Purposeful movement, Responds to commands   RLE Motor Response: Purposeful movement, Responds to commands        Post-op Vital Signs: Reviewed  Last Vitals:  Filed Vitals:   03/18/15 1203  BP: 128/59  Pulse: 75  Temp: 36.3 C  Resp: 18    Complications: No apparent anesthesia complications

## 2015-03-19 ENCOUNTER — Telehealth: Payer: Self-pay | Admitting: Vascular Surgery

## 2015-03-19 ENCOUNTER — Ambulatory Visit (HOSPITAL_COMMUNITY): Payer: BLUE CROSS/BLUE SHIELD

## 2015-03-19 MED ORDER — OXYCODONE-ACETAMINOPHEN 5-325 MG PO TABS: 1.0000 | ORAL_TABLET | Freq: Four times a day (QID) | ORAL | Status: DC | PRN

## 2015-03-19 NOTE — Evaluation (Signed)
Occupational Therapy Evaluation and Discharge Summary Patient Details Name: Derrick [name] MRN: [id number] DOB: 05/13/1956 Today's Date: 03/19/2015    History of Present Illness Pt is a 59 y/o M s/p fem pop bypass 2/2 claudication (Lt>Rt).  Pt's PMH includes OA, metatarsalgia, CAD, cervical fusion, Rt elbow arthroplasty, Rt rotator cuff repair.   Clinical Impression   Pt admitted with the above diagnosis and overall is doing very well with adls requiring occasional min assist to donn pants and socks.  Pt has a sock aid and reacher at home from mother in law that will allow him to dress self Ily when wife at work.  Pt has all other necessary equipment and is not in need of further OT.    Follow Up Recommendations  No OT follow up    Equipment Recommendations  None recommended by OT    Recommendations for Other Services       Precautions / Restrictions Precautions Precautions: Fall Precaution Comments: Educated pt on importance of maintaining Lt knee extension when sitting or supine  Restrictions Weight Bearing Restrictions: No      Mobility Bed Mobility Overal bed mobility: Independent                Transfers Overall transfer level: Modified independent Equipment used: Rolling walker (2 wheeled) Transfers: Sit to/from Stand Sit to Stand: Modified independent (Device/Increase time)         General transfer comment: Pt transferred safely and easily this morning.    Balance Overall balance assessment: No apparent balance deficits (not formally assessed)                                          ADL Overall ADL's : Needs assistance/impaired Eating/Feeding: Independent;Sitting   Grooming: Modified independent;Standing   Upper Body Bathing: Set up;Sitting   Lower Body Bathing: Minimal assistance;Sit to/from stand Lower Body Bathing Details (indicate cue type and reason): min assist to reach L foot.  Rec long sponge. Upper Body Dressing :  Set up;Sitting   Lower Body Dressing: Minimal assistance;Sit to/from stand Lower Body Dressing Details (indicate cue type and reason): pt needed min assist to start pants over L leg and to donn L sock.  Pt has a reacher and sock aid at home he can use to dress LE if his wife is at work. Toilet Transfer: Supervision/safety;Ambulation;Grab bars;Comfort height toilet   Toileting- Clothing Manipulation and Hygiene: Supervision/safety;Sit to/from stand   Tub/ Shower Transfer: Supervision/safety;Ambulation;Rolling walker;Walk-in shower   Functional mobility during ADLs: Supervision/safety;Rolling walker General ADL Comments: wife there through weekend then pt will be alone when she works.     Vision     Perception     Praxis      Pertinent Vitals/Pain Pain Assessment: 0-10 Pain Score: 4  Pain Location: Lt LE Pain Descriptors / Indicators: Sore Pain Intervention(s): Limited activity within patient's tolerance;Repositioned;Monitored during session;Premedicated before session     Hand Dominance Right   Extremity/Trunk Assessment Upper Extremity Assessment Upper Extremity Assessment: Overall WFL for tasks assessed   Lower Extremity Assessment Lower Extremity Assessment: Defer to PT evaluation RLE Deficits / Details: h/o claudication in Rt LE LLE Deficits / Details: weakness and limited ROM as expected s/p Lt fem pop bypass   Cervical / Trunk Assessment Cervical / Trunk Assessment: Normal   Communication Communication Communication: No difficulties   Cognition Arousal/Alertness: Awake/alert Behavior During Therapy: [facility]  for tasks assessed/performed Overall Cognitive Status: Within Functional Limits for tasks assessed                     General Comments       Exercises       Shoulder Instructions      Home Living Family/patient expects to be discharged to:: Private residence Living Arrangements: Spouse/significant other Available Help at Discharge:  Family;Available 24 hours/day Type of Home: House Home Access: Stairs to enter [employer] of Steps: 3 Entrance Stairs-Rails: Can reach both Home Layout: One level     Bathroom Shower/Tub: Walk-in shower;Door   [employer] Toilet: Handicapped height Bathroom Accessibility: Yes How Accessible: Accessible via walker Home Equipment: Walker - 2 wheels;Bedside commode;Crutches;Shower seat - built in          Prior Functioning/Environment Level of Independence: Independent             OT Diagnosis:     OT Problem List:     OT Treatment/Interventions:      OT Goals(Current goals can be found in the care plan section) Acute Rehab OT Goals Patient Stated Goal: to go home once ready OT Goal Formulation: All assessment and education complete, DC therapy  OT Frequency:     Barriers to D/C:            Co-evaluation              End of Session Equipment Utilized During Treatment: Rolling walker Nurse Communication: Mobility status;Other (comment) (wife ready to take pt home.)  Activity Tolerance: Patient tolerated treatment well Patient left: in bed;with call bell/phone within reach;with family/visitor present   Time: 6962-9528 OT Time Calculation (min): 17 min Charges:  OT General Charges $OT Visit: 1 Procedure OT Evaluation $Initial OT Evaluation Tier I: 1 Procedure G-Codes:    Hope Budds Apr 04, 2015, 10:31 AM  [phone]

## 2015-03-19 NOTE — Progress Notes (Signed)
Patient discharged to home with wife. IV dc'd, telemetry dc'd. Vitals stable for patient. Post procedure education and discharge instructions reviewed with patient and wife. All questions addressed. 03/19/2015 10:40 AM Derrick [name]

## 2015-03-19 NOTE — Discharge Summary (Signed)
Discharge Summary     ZAKHARI FOGEL 1955/09/09 59 y.o. male  [id number]  Admission Date: 03/17/2015  Discharge Date: 03/19/15  Physician: Sherren Kerns, MD  Admission Diagnosis: Peripheral vascular disease with bilateral lower extremity claudication I70.213   HPI:   This is a 59 y.o. male who presents for evaluation of bilateral leg pain. He underwent bilateral common iliac artery stenting several weeks ago. He states he has not noticed any difference in his claudication symptoms. The patient complains of a burning sensation on the right anterior thigh in the dorsum of both feet. This occurs intermittently. He also complains of pain in both calves left greater than right after walking 100 feet. The pain in the calf resolves fairly quickly by resting. It returns pretty consistently. He denies rest pain. He denies any nonhealing wounds. He is a former smoker quitting in 1991. The problem has been slowly progressive over the last 2 years. He is having difficulty at work because he can't get around very well. He has also had an MRI of the lumbar spine which showed some degenerative joint disease.. Other medical problems include coronary artery disease, hyperlipidemia (intolerant to statins), degenerative joint disease all of which are currently stable. He did have an episode of renal insufficiency in the past and was documented as CK D3. The patient states this was related to meloxicam and his kidney function is now normal.  Hospital Course:  The patient was admitted to the hospital and taken to the operating room on 03/17/2015 and underwent: Left femoral to posterior tibial bypass (non-reversed left greater saphenous vein) with sequential graft to the below-knee popliteal artery (reversed greater saphenous vein), left common femoral endarterectomy with profundoplasty and vein patch, intraoperative arteriogram 2    The pt tolerated the procedure well and was transported to the PACU  in good condition.   The pt has done well post operatively with brisk DP/PT doppler signals.  The left DP pulse is faintly palpable.    He is not on a statin due to allergy.  The remainder of the hospital course consisted of increasing mobilization and increasing intake of solids without difficulty.  CBC    Component Value Date/Time   WBC 9.9 03/18/2015 0444   RBC 3.40* 03/18/2015 0444   HGB 9.9* 03/18/2015 0444   HCT 30.2* 03/18/2015 0444   PLT 235 03/18/2015 0444   MCV 88.8 03/18/2015 0444   MCH 29.1 03/18/2015 0444   MCHC 32.8 03/18/2015 0444   RDW 12.9 03/18/2015 0444   LYMPHSABS 1.4 01/17/2015 1319   MONOABS 0.9 01/17/2015 1319   EOSABS 0.2 01/17/2015 1319   BASOSABS 0.1 01/17/2015 1319    BMET    Component Value Date/Time   NA 140 03/18/2015 0444   K 3.9 03/18/2015 0444   CL 102 03/18/2015 0444   CO2 25 03/18/2015 0444   GLUCOSE 132* 03/18/2015 0444   BUN 14 03/18/2015 0444   CREATININE 1.36* 03/18/2015 0444   CALCIUM 8.3* 03/18/2015 0444   GFRNONAA 55* 03/18/2015 0444   GFRAA >60 03/18/2015 0444       Discharge Instructions    Call MD for:  redness, tenderness, or signs of infection (pain, swelling, bleeding, redness, odor or green/yellow discharge around incision site)    Complete by:  As directed      Call MD for:  severe or increased pain, loss or decreased feeling  in affected limb(s)    Complete by:  As directed      Call  MD for:  temperature >100.5    Complete by:  As directed      Discharge wound care:    Complete by:  As directed   Wash the groin wound with soap and water daily and pat dry. (No tub bath-only shower)  Then put a dry gauze or washcloth there to keep this area dry daily and as needed.  Do not use Vaseline or neosporin on your incisions.  Only use soap and water on your incisions and then protect and keep dry.     Driving Restrictions    Complete by:  As directed   No driving for 2 weeks     Lifting restrictions    Complete by:  As  directed   No lifting for 4 weeks     Resume previous diet    Complete by:  As directed            Discharge Diagnosis:  Peripheral vascular disease with bilateral lower extremity claudication I70.213  Secondary Diagnosis: Patient Active Problem List   Diagnosis Date Noted  . PAD (peripheral artery disease) (HCC) 03/17/2015  . Closed fracture of shaft of metacarpal bone(s) 04/23/2013  . Right rotator cuff tear 04/23/2013  . Coronary artery disease 10/29/2010  . Hyperlipidemia 10/29/2010   Past Medical History  Diagnosis Date  . Hyperparathyroidism   . Hypertension   . Dyslipidemia   . OA (osteoarthritis)   . Metatarsalgia   . Hyperlipidemia     statin intolerant  . GERD (gastroesophageal reflux disease)   . Coronary artery disease 2006    stents x2=LAD LCX; totally occluded RCA  . Seasonal allergies   . Insomnia   . Peripheral vascular disease (HCC)        Medication List    TAKE these medications        aspirin 81 MG tablet  Take 1 tablet (81 mg total) by mouth daily.     calcitRIOL 0.5 MCG capsule  Commonly known as:  ROCALTROL  Take 0.5 mcg by mouth daily.     calcium-vitamin D 500-200 MG-UNIT tablet  Commonly known as:  OSCAL WITH D  Take 2 tablets by mouth. Take 2 tabs Am and 1 tab Pm     clopidogrel 75 MG tablet  Commonly known as:  PLAVIX  Take 1 tablet (75 mg total) by mouth daily.     ezetimibe 10 MG tablet  Commonly known as:  ZETIA  Take 10 mg by mouth daily.     fenofibrate 160 MG tablet  Take 160 mg by mouth daily.     fish oil-omega-3 fatty acids 1000 MG capsule  Take 4,800 mg by mouth daily. Take 2 tabs. Am and 2 tabs Pm     glucosamine-chondroitin 500-400 MG tablet  Take 1 tablet by mouth 2 (two) times daily.     loratadine 10 MG tablet  Commonly known as:  CLARITIN  Take 10 mg by mouth daily.     losartan 100 MG tablet  Commonly known as:  COZAAR  Take 100 mg by mouth daily.     multivitamin with minerals tablet  Take 1  tablet by mouth daily.     mupirocin ointment 2 %  Commonly known as:  BACTROBAN  Place 1 application into the nose 2 (two) times daily. Needs 2 additional doses for 10 doses     nitroGLYCERIN 0.4 MG SL tablet  Commonly known as:  NITROSTAT  Place 1 tablet (0.4 mg total) under the tongue  every 5 (five) minutes as needed for chest pain.     OVER THE COUNTER MEDICATION  Take 120 mg by mouth every morning. Ginkgo Biloba     oxyCODONE-acetaminophen 5-325 MG tablet  Commonly known as:  PERCOCET/ROXICET  Take 1 tablet by mouth every 6 (six) hours as needed for moderate pain.     Potassium 99 MG Tabs  Take 1 tablet by mouth daily.     ranitidine 150 MG tablet  Commonly known as:  ZANTAC  Take 150 mg by mouth 2 (two) times daily.     traZODone 50 MG tablet  Commonly known as:  DESYREL  Take 50 mg by mouth at bedtime.     TYLENOL ARTHRITIS PAIN 650 MG CR tablet  Generic drug:  acetaminophen  Take 1,300 mg by mouth 2 (two) times daily.        Prescriptions given: 1. Percoet #30 No Refill  Instructions: 1.  Wash the groin wound with soap and water daily and pat dry. (No tub bath-only shower)  Then put a dry gauze or washcloth there to keep this area dry daily and as needed.  Do not use Vaseline or neosporin on your incisions.  Only use soap and water on your incisions and then protect and keep dry.  Disposition: home  Patient's condition: is Good  Follow up: 1. Dr. Darrick PennaFields in 2 weeks   Doreatha MassedSamantha Rhyne, PA-C [facility] [phone] 03/19/2015  7:33 AM   Agree with above.  Incisions healing - For VQI Registry use --- Instructions: Press F2 to tab through selections.  Delete question if not applicable.   Post-op:  Wound infection: No  Graft infection: No  Transfusion: No  If yes, n/a units given New Arrhythmia: No Ipsilateral amputation: No, [ ]  Minor, [ ]  BKA, [ ]  AKA Discharge patency: [x ] Primary, [ ]  Primary assisted, [ ]  Secondary, [ ]   Occluded Patency judged by: [ x] Dopper only, [ ]  Palpable graft pulse, [x ] Faint Palpable distal pulse, [ ]  ABI inc. > 0.15, [ ]  Duplex Discharge ABI: R not done, L  Discharge TBI: R , L  D/C Ambulatory Status: Ambulatory  Complications: MI: No, [ ]  Troponin only, [ ]  EKG or Clinical CHF: No Resp failure:No, [ ]  Pneumonia, [ ]  Ventilator Chg in renal function: No, [ ]  Inc. Cr > 0.5, [ ]  Temp. Dialysis, [ ]  Permanent dialysis Stroke: No, [ ]  Minor, [ ]  Major Return to OR: No  Reason for return to OR: [ ]  Bleeding, [ ]  Infection, [ ]  Thrombosis, [ ]  Revision  Discharge medications: Statin use:  No-allergy ASA use:  yes Plavix use:  yes Beta blocker use: no Coumadin use: no ARB use:  yes

## 2015-03-19 NOTE — Telephone Encounter (Signed)
Spoke with pt, dpm °

## 2015-03-19 NOTE — Progress Notes (Addendum)
  Progress Note    03/19/2015 7:26 AM 2 Days Post-Op  Subjective:  Feeling groggy from Percocet this am  Afebrile 120's-140's systolic HR 70's-80's NSR 97% RA  Filed Vitals:   03/19/15 0521  BP: 140/57  Pulse: 75  Temp: 98.2 F (36.8 C)  Resp: 18    Physical Exam: Lungs:  Non labored Incisions:  All incisions are c/d/i without hematoma Extremities:  Left foot is warm and well perfused.  Faintly palpable left DP  CBC    Component Value Date/Time   WBC 9.9 03/18/2015 0444   RBC 3.40* 03/18/2015 0444   HGB 9.9* 03/18/2015 0444   HCT 30.2* 03/18/2015 0444   PLT 235 03/18/2015 0444   MCV 88.8 03/18/2015 0444   MCH 29.1 03/18/2015 0444   MCHC 32.8 03/18/2015 0444   RDW 12.9 03/18/2015 0444   LYMPHSABS 1.4 01/17/2015 1319   MONOABS 0.9 01/17/2015 1319   EOSABS 0.2 01/17/2015 1319   BASOSABS 0.1 01/17/2015 1319    BMET    Component Value Date/Time   NA 140 03/18/2015 0444   K 3.9 03/18/2015 0444   CL 102 03/18/2015 0444   CO2 25 03/18/2015 0444   GLUCOSE 132* 03/18/2015 0444   BUN 14 03/18/2015 0444   CREATININE 1.36* 03/18/2015 0444   CALCIUM 8.3* 03/18/2015 0444   GFRNONAA 55* 03/18/2015 0444   GFRAA >60 03/18/2015 0444    INR    Component Value Date/Time   INR 1.08 03/12/2015 1545     Intake/Output Summary (Last 24 hours) at 03/19/15 0726 Last data filed at 03/19/15 0500  Gross per 24 hour  Intake    800 ml  Output   2000 ml  Net  -1200 ml     Assessment:  59 y.o. male is s/p:  Left femoral to posterior tibial bypass (non-reversed left greater saphenous vein) with sequential graft to the below-knee popliteal artery (reversed greater saphenous vein), left common femoral endarterectomy with profundoplasty and vein patch, intraoperative arteriogram 2  2 Days Post-Op  Plan: -pt doing well this am-left foot is warm and well perfused.  Faintly palpable left DP pulse. -all incisions are clean and dry -ambulating in hallways with walker-has  walker at home -pain is well controlled -discussed groin wound care with pt and wife -dc home today -f/u with Dr. Darrick PennaFields in 2 weeks   Doreatha MassedSamantha Rhyne, PA-C [facility] [phone] 03/19/2015 7:26 AM    Agree with above. D/c home today  Fabienne Brunsharles Fields, MD [facility] [place] Office: [phone] Pager: [phone]

## 2015-03-19 NOTE — Telephone Encounter (Signed)
-----   Message from Dara LordsSamantha J Rhyne, [place]-C sent at 03/19/2015  7:31 AM EDT ----- S/p Left femoral to posterior tibial bypass (non-reversed left greater saphenous vein) with sequential graft to the below-knee popliteal artery (reversed greater saphenous vein), left common femoral endarterectomy with profundoplasty and vein patch, intraoperative arteriogram 2  F/u with Dr. Darrick PennaFields in 2 weeks.  Thanks, Lelon MastSamantha

## 2015-03-19 NOTE — Care Management Note (Signed)
Case Management Note CM note started by Derrick GallopAngela [name] RNCM   Patient Details  Name: Derrick [name] MRN: [id number] Date of Birth: 08/21/1955  Subjective/Objective:                 Admitted from home with wife s/p fem pop bypass. Independent with ADL's. Pt with cane and walker @ home.  Action/Plan: Return to home when medically stable.CM to f/u with disposition needs.  Expected Discharge Date:      03/19/15            Expected Discharge Plan:  Home w Home Health Services  In-House Referral:     Discharge planning Services  CM Consult  Post Acute Care Choice:  Home Health Choice offered to:  Patient  DME Arranged:    DME Agency:     HH Arranged:  PT, Patient Refused HH Agency:  [facility]  Status of Service:  Completed, signed off  Medicare Important Message Given:    Date Medicare IM Given:    Medicare IM give by:    Date Additional Medicare IM Given:    Additional Medicare Important Message give by:     If discussed at Long Length of Stay Meetings, dates discussed:    Additional Comments:  03/19/15- Derrick PieriniKristi Teretha Chalupa RN, BSN - [facility] per bedside RN Derrick [name]- pt stated that he already had 2 walkers at home and did not need another, he also stated at discharge that he did not feel like he would need HH therapy and did not want any copay cost- he did well with OT prior to discharge and felt like he would be fine without any further therapy- per bedside RN- pt tried to call [facility] to decline services- no HH orders had been placed- [name].  CM spoke with pt regarding recommendations from PT: Home health PT;Supervision for mobility/OOB. Pt in agreement with hhpt. Choice given. Pt choices [facility] for HHPT. Referral made with Derrick [name]([facility]) @ [phone] for hhpt.    Derrick PieriniWebster, Derrick [name], [place]RN,BSN,CM   [phone] 03/19/2015, 11:00 AM

## 2015-03-27 ENCOUNTER — Telehealth: Payer: Self-pay

## 2015-03-27 ENCOUNTER — Encounter: Payer: Self-pay | Admitting: Family

## 2015-03-27 ENCOUNTER — Ambulatory Visit (INDEPENDENT_AMBULATORY_CARE_PROVIDER_SITE_OTHER): Payer: BLUE CROSS/BLUE SHIELD | Admitting: Family

## 2015-03-27 VITALS — BP 136/72 | HR 80 | Temp 99.6°F | Resp 16 | Ht 74.0 in | Wt 210.0 lb

## 2015-03-27 DIAGNOSIS — L24A9 Irritant contact dermatitis due friction or contact with other specified body fluids: Secondary | ICD-10-CM

## 2015-03-27 DIAGNOSIS — T148XXA Other injury of unspecified body region, initial encounter: Secondary | ICD-10-CM

## 2015-03-27 DIAGNOSIS — M7989 Other specified soft tissue disorders: Secondary | ICD-10-CM | POA: Insufficient documentation

## 2015-03-27 DIAGNOSIS — T148 Other injury of unspecified body region: Secondary | ICD-10-CM

## 2015-03-27 DIAGNOSIS — Z9889 Other specified postprocedural states: Secondary | ICD-10-CM

## 2015-03-27 NOTE — Progress Notes (Signed)
    Postoperative Visit   History of Present Illness  Derrick [name] is a 59 y.o. year old male patient of Dr. Darrick PennaFields  who is s/p left femoral to posterior tibial bypass (non-reversed left GSV) with sequential graft to the below knee popliteal artery (reversed GSV), left common femoral endarterectomy with profundoplasty and vein patch on 03/17/15. He returns today with c/o swelling of leg and scant s/s drainage from lower leg incision. He denies fever or chills. His left leg incisions are healing well with minimal s/s drainage from a small part of the most distal incision, minimal erythema at two small areas of incision.  The patient notes resolution of lower extremity symptoms.  The patient is able to complete their activities of daily living.  He is also s/p bilateral common iliac artery stenting several weeks ago.  He is a former smoker quitting in 1991.   He has also had an MRI of the lumbar spine which showed some degenerative joint disease.. Other medical problems include coronary artery disease, hyperlipidemia (intolerant to statins), degenerative joint disease all of which are currently stable. He did have an episode of renal insufficiency in the past and was documented as CK D3. The patient states this was related to meloxicam and his kidney function is now normal. Pt states that he will have revascularization performed on his right leg after his left leg heals.     For VQI Use Only  PRE-ADM LIVING: Home  AMB STATUS: Ambulatory  Physical Examination  Filed Vitals:   03/27/15 1413  BP: 136/72  Pulse: 80  Temp: 99.6 F (37.6 C)  TempSrc: Oral  Resp: 16  Height: 6\' 2"  (1.88 m)  Weight: 210 lb (95.255 kg)  SpO2: 95%   Body mass index is 26.95 kg/(m^2).  Left lower extremity: Incisions are healing well with Dermabond peeling away, no signs of ischemia, bilateral pedal pulses are audible by Doppler. Bilateral femoral pulses are palpable. He has 3+ non pitting edema in  his left leg. He has been elevating his legs in his recliner, but not above his heart.  Medical Decision Making  Derrick [name] is a 59 y.o. year old male who presents s/p left femoral to posterior tibial bypass (non-reversed left GSV) with sequential graft to the below knee popliteal artery (reversed GSV), left common femoral endarterectomy with profundoplasty and vein patch on 03/17/15.   The patient's bypass incisions are healing appropriately with resolution of pre-operative symptoms.  The patient and his wife were instructed in how to elevate his legs properly to minimize the left leg swelling.  Wash left leg incisions twice daily with soap and water and pat dry with clean towel.  Follow up with Dr. Darrick PennaFields in 1 week as scheduled, Dr. Darrick PennaFields is not in the office in 2 weeks. I discussed in depth with the patient the nature of atherosclerosis, and emphasized the importance of maximal medical management including strict control of blood pressure, blood glucose, and lipid levels, obtaining regular exercise, and cessation of smoking.  The patient is aware that without maximal medical management the underlying atherosclerotic disease process will progress, limiting the benefit of any interventions.   Kacy Conely, Carma LairSUZANNE L, RN, MSN, [name]-C [facility] [place] Office: [phone]  03/27/2015, 2:00 PM  Clinic MD: Imogene Burnhen

## 2015-03-27 NOTE — Patient Instructions (Signed)
Peripheral Vascular Disease Peripheral vascular disease (PVD) is a disease of the blood vessels that are not part of your heart and brain. A simple term for PVD is poor circulation. In most cases, PVD narrows the blood vessels that carry blood from your heart to the rest of your body. This can result in a decreased supply of blood to your arms, legs, and internal organs, like your stomach or kidneys. However, it most often affects a person's lower legs and feet. There are two types of PVD.  Organic PVD. This is the more common type. It is caused by damage to the structure of blood vessels.  Functional PVD. This is caused by conditions that make blood vessels contract and tighten (spasm). Without treatment, PVD tends to get worse over time. PVD can also lead to acute ischemic limb. This is when an arm or limb suddenly has trouble getting enough blood. This is a medical emergency. CAUSES Each type of PVD has many different causes. The most common cause of PVD is buildup of a fatty material (plaque) inside of your arteries (atherosclerosis). Small amounts of plaque can break off from the walls of the blood vessels and become lodged in a smaller artery. This blocks blood flow and can cause acute ischemic limb. Other common causes of PVD include:  Blood clots that form inside of blood vessels.  Injuries to blood vessels.  Diseases that cause inflammation of blood vessels or cause blood vessel spasms.  Health behaviors and health history that increase your risk of developing PVD. RISK FACTORS  You may have a greater risk of PVD if you:  Have a family history of PVD.  Have certain medical conditions, including:  High cholesterol.  Diabetes.  High blood pressure (hypertension).  Coronary heart disease.  Past problems with blood clots.  Past injury, such as burns or a broken bone. These may have damaged blood vessels in your limbs.  Buerger disease. This is caused by inflamed blood  vessels in your hands and feet.  Some forms of arthritis.  Rare birth defects that affect the arteries in your legs.  Use tobacco.  Do not get enough exercise.  Are obese.  Are age 50 or older. SIGNS AND SYMPTOMS  PVD may cause many different symptoms. Your symptoms depend on what part of your body is not getting enough blood. Some common signs and symptoms include:  Cramps in your lower legs. This may be a symptom of poor leg circulation (claudication).  Pain and weakness in your legs while you are physically active that goes away when you rest (intermittent claudication).  Leg pain when at rest.  Leg numbness, tingling, or weakness.  Coldness in a leg or foot, especially when compared with the other leg.  Skin or hair changes. These can include:  Hair loss.  Shiny skin.  Pale or bluish skin.  Thick toenails.  Inability to get or maintain an erection (erectile dysfunction). People with PVD are more prone to developing ulcers and sores on their toes, feet, or legs. These may take longer than normal to heal. DIAGNOSIS Your health care provider may diagnose PVD from your signs and symptoms. The health care provider will also do a physical exam. You may have tests to find out what is causing your PVD and determine its severity. Tests may include:  Blood pressure recordings from your arms and legs and measurements of the strength of your pulses (pulse volume recordings).  Imaging studies using sound waves to take pictures of   the blood flow through your blood vessels (Doppler ultrasound).  Injecting a dye into your blood vessels before having imaging studies using:  X-rays (angiogram or arteriogram).  Computer-generated X-rays (CT angiogram).  A powerful electromagnetic field and a computer (magnetic resonance angiogram or MRA). TREATMENT Treatment for PVD depends on the cause of your condition and the severity of your symptoms. It also depends on your age. Underlying  causes need to be treated and controlled. These include long-lasting (chronic) conditions, such as diabetes, high cholesterol, and high blood pressure. You may need to first try making lifestyle changes and taking medicines. Surgery may be needed if these do not work. Lifestyle changes may include:  Quitting smoking.  Exercising regularly.  Following a low-fat, low-cholesterol diet. Medicines may include:  Blood thinners to prevent blood clots.  Medicines to improve blood flow.  Medicines to improve your blood cholesterol levels. Surgical procedures may include:  A procedure that uses an inflated balloon to open a blocked artery and improve blood flow (angioplasty).  A procedure to put in a tube (stent) to keep a blocked artery open (stent implant).  Surgery to reroute blood flow around a blocked artery (peripheral bypass surgery).  Surgery to remove dead tissue from an infected wound on the affected limb.  Amputation. This is surgical removal of the affected limb. This may be necessary in cases of acute ischemic limb that are not improved through medical or surgical treatments. HOME CARE INSTRUCTIONS  Take medicines only as directed by your health care provider.  Do not use any tobacco products, including cigarettes, chewing tobacco, or electronic cigarettes. If you need help quitting, ask your health care provider.  Lose weight if you are overweight, and maintain a healthy weight as directed by your health care provider.  Eat a diet that is low in fat and cholesterol. If you need help, ask your health care provider.  Exercise regularly. Ask your health care provider to suggest some good activities for you.  Use compression stockings or other mechanical devices as directed by your health care provider.  Take good care of your feet.  Wear comfortable shoes that fit well.  Check your feet often for any cuts or sores. SEEK MEDICAL CARE IF:  You have cramps in your legs  while walking.  You have leg pain when you are at rest.  You have coldness in a leg or foot.  Your skin changes.  You have erectile dysfunction.  You have cuts or sores on your feet that are not healing. SEEK IMMEDIATE MEDICAL CARE IF:  Your arm or leg turns cold and blue.  Your arms or legs become red, warm, swollen, painful, or numb.  You have chest pain or trouble breathing.  You suddenly have weakness in your face, arm, or leg.  You become very confused or lose the ability to speak.  You suddenly have a very bad headache or lose your vision.   This information is not intended to replace advice given to you by your health care provider. Make sure you discuss any questions you have with your health care provider.   Document Released: 06/23/2004 Document Revised: 06/06/2014 Document Reviewed: 10/24/2013 Elsevier Interactive Patient Education 2016 Elsevier Inc.  

## 2015-03-27 NOTE — Telephone Encounter (Signed)
Phone call from pt.  Reported he is concerned about infection in left lower leg incision; requested an antibiotic be called to pharmacy.  Reported "dark red" color to lower end of incision between knee and ankle.  Stated there is a clear fluid draining from it, and appearance of small area opening up.  Stated the left lower leg is swollen, but non-tender.  Denied fever/ chills.  Advised unable to prescribe an antibiotic without examining pt.  Appt. Given for 1:45 PM with NP.  Agrees with plan.

## 2015-03-31 ENCOUNTER — Encounter: Payer: Self-pay | Admitting: Vascular Surgery

## 2015-04-02 ENCOUNTER — Encounter: Payer: Self-pay | Admitting: Vascular Surgery

## 2015-04-02 ENCOUNTER — Ambulatory Visit (INDEPENDENT_AMBULATORY_CARE_PROVIDER_SITE_OTHER): Payer: BLUE CROSS/BLUE SHIELD | Admitting: Vascular Surgery

## 2015-04-02 VITALS — BP 139/62 | HR 74 | Temp 98.0°F | Ht 74.0 in | Wt 207.0 lb

## 2015-04-02 DIAGNOSIS — I739 Peripheral vascular disease, unspecified: Secondary | ICD-10-CM

## 2015-04-02 NOTE — Addendum Note (Signed)
Addended by: Adria DillELDRIDGE-LEWIS, Suleika Donavan L on: 04/02/2015 01:46 PM   Modules accepted: Orders

## 2015-04-02 NOTE — Progress Notes (Signed)
Patient is a 59 year old male who underwent left femoral to posterior tibial bypass and a sequential graft to the below-knee popliteal artery on October 18. He presents today for postoperative follow-up.  He still has some swelling in the left leg although this is improved. He has been walking better with improved claudication symptoms. He has had a small amount of drainage from his upper thigh incision and the below-knee incision. This is also improving.   Physical exam:  Filed Vitals:   04/02/15 0909  BP: 139/62  Pulse: 74  Temp: 98 F (36.7 C)  TempSrc: Oral  Height: 6\' 2"  (1.88 m)  Weight: 207 lb (93.895 kg)  SpO2: 100%     Left lower extremity: 2+ dorsalis pedis pulse healing incisions overall small area of clear drainage from the upper thigh incision groin incision healing well , the below-knee incision also has a punctate area with small amount of serous drainage. Otherwise this is healing well   Abdomen: patient had been noticing an occasional long on the right side of his abdomen. With Valsalva maneuver he seems to have a small inguinal hernia which is spontaneously reducible.   Assessment:  #1slowly improving left leg swelling with improvement of claudication symptoms and patent bypass graft left leg     #2spontaneously reducible right inguinal hernia   Plan: The patient will follow-up with me in 1 month to make sure all incisions in his left leg have completely healed before we consider doing a bypass on his right leg. As far as his hernia is concerned I told him that this is not bothering him he can continue to watch this but if he starts to have soreness or notices the bulge staining out more he can discuss this further with his primary care physician for consideration of inguinal hernia repair.  Fabienne Brunsharles Seara Hinesley, MD [facility] [place] Office: [phone] Pager: [phone]

## 2015-04-15 ENCOUNTER — Telehealth: Payer: Self-pay

## 2015-04-15 NOTE — Telephone Encounter (Signed)
-----   Message from Sherren Kernsharles E Fields, MD sent at 04/14/2015  2:30 PM EST ----- Regarding: RE: need approval for FMLA extension yes ----- Message -----    From: Phillips Odorarol S Eustolia Drennen, RN    Sent: 04/09/2015  10:51 AM      To: Sherren Kernsharles E Fields, MD Subject: need approval for FMLA extension               He is s/p (L) Fem-Post. Tib BP 10/18.  He is asking if we can send a letter to his employer to extend the FMLA ST Disability, beyond the return to work date of 11/29.  He is anticipating that you will schedule him for right leg BP mid December, and so would like to continue receiving the STD, until he recovers from the 2nd surgery.  Are you in agreement with this?

## 2015-04-15 NOTE — Telephone Encounter (Signed)
Sent letter requesting extension of STD for pt., in anticipation of scheduling next surgery for Right LE BP, [facility] [place] @ [phone].

## 2015-04-22 ENCOUNTER — Ambulatory Visit (INDEPENDENT_AMBULATORY_CARE_PROVIDER_SITE_OTHER)
Admission: RE | Admit: 2015-04-22 | Discharge: 2015-04-22 | Disposition: A | Discharge status: Home / Self Care | Payer: BLUE CROSS/BLUE SHIELD | Source: Ambulatory Visit | Attending: Vascular Surgery | Admitting: Vascular Surgery

## 2015-04-22 ENCOUNTER — Ambulatory Visit (HOSPITAL_COMMUNITY)
Admission: RE | Admit: 2015-04-22 | Discharge: 2015-04-22 | Disposition: A | Discharge status: Home / Self Care | Payer: BLUE CROSS/BLUE SHIELD | Source: Ambulatory Visit | Attending: Vascular Surgery | Admitting: Vascular Surgery

## 2015-04-22 DIAGNOSIS — I739 Peripheral vascular disease, unspecified: Secondary | ICD-10-CM

## 2015-04-22 DIAGNOSIS — I70201 Unspecified atherosclerosis of native arteries of extremities, right leg: Secondary | ICD-10-CM | POA: Insufficient documentation

## 2015-04-22 DIAGNOSIS — I10 Essential (primary) hypertension: Secondary | ICD-10-CM | POA: Diagnosis not present

## 2015-04-22 DIAGNOSIS — E785 Hyperlipidemia, unspecified: Secondary | ICD-10-CM | POA: Insufficient documentation

## 2015-04-28 ENCOUNTER — Encounter: Payer: Self-pay | Admitting: Vascular Surgery

## 2015-04-30 ENCOUNTER — Encounter: Payer: Self-pay | Admitting: Vascular Surgery

## 2015-04-30 ENCOUNTER — Ambulatory Visit (INDEPENDENT_AMBULATORY_CARE_PROVIDER_SITE_OTHER): Payer: BLUE CROSS/BLUE SHIELD | Admitting: Vascular Surgery

## 2015-04-30 VITALS — BP 138/78 | HR 70 | Temp 98.5°F | Resp 14 | Ht 74.0 in | Wt 207.0 lb

## 2015-04-30 DIAGNOSIS — I739 Peripheral vascular disease, unspecified: Secondary | ICD-10-CM

## 2015-04-30 NOTE — Progress Notes (Signed)
Patient is a 59-year-old male who underwent left femoral to posterior tibial bypass and a sequential graft to the below-knee popliteal artery on October 18. He presents today for postoperative follow-up.  He still has some swelling in the left leg although this is improved. He has been walking better with improved claudication symptoms. Incisions are essentially healed at this point. He describes claudication symptoms in the right leg as well. Previous arteriogram showed multiple exophytic calcific obstructions of his right superficial femoral artery and haziness of the above-knee popliteal artery suggesting high-grade stenosis. He had a patent below-knee popliteal artery with good runoff. At this point he wishes to have the right leg bypass performed.  Past Medical History  Diagnosis Date  . Hyperparathyroidism   . Hypertension   . Dyslipidemia   . OA (osteoarthritis)   . Metatarsalgia   . Hyperlipidemia     statin intolerant  . GERD (gastroesophageal reflux disease)   . Coronary artery disease 2006    stents x2=LAD LCX; totally occluded RCA  . Seasonal allergies   . Insomnia   . Peripheral vascular disease (HCC)     Past Surgical History  Procedure Laterality Date  . Cervical spine surgery  2004  . Cardiac catheterization  10/28/2004    circ and mid LAD chyper stents  . Cervical fusion  2007,2009  . Incision and drainage abscess posterior cervicalspine  2009  . Elbow arthroplasty  1994    right  . Colonoscopy    . Shoulder open rotator cuff repair Right 04/23/2013    Procedure: RIGHT TWO TENDON ROTATOR CUFF REPAIR  SUBACROMIAL DECOMPRESSION AND OPEN DISTAL CLAVICLE RESECTION;  Surgeon: Robert V Sypher Jr., MD;  Location: [facility];  Service: Orthopedics;  Laterality: Right;  . Peripheral vascular catheterization N/A 01/16/2015    Procedure: Abdominal Aortogram;  Surgeon: Gene Glazebrook E Keelynn Furgerson, MD;  Location: [facility];  Service: Cardiovascular;  Laterality: N/A;  .  Stents placed   2006  . Coronary angioplasty  2006    stents placed   . Femoral-popliteal bypass graft Left 03/17/2015    Procedure: BYPASS GRAFT FEMORAL-POPLITEAL ARTERY-LEFT LEG AND POSTERIOR TIBIAL SEQUENTIAL GRAFT TO BELOW KNEE POPLITEAL ARTERY;  Surgeon: Todrick Siedschlag E Alfredo Collymore, MD;  Location: [facility] OR;  Service: Vascular;  Laterality: Left;  . Endarterectomy femoral Left 03/17/2015    Procedure: ENDARTERECTOMY COMMON FEMORAL WITH PROFUNDOPLASTY;  Surgeon: Chancy Smigiel E Cruzita Lipa, MD;  Location: [facility];  Service: Vascular;  Laterality: Left;  . Patch angioplasty Left 03/17/2015    Procedure: VEIN PATCH ANGIOPLASTY COMMON FEMORAL ARTERY;  Surgeon: Jerline Linzy E Elorah Dewing, MD;  Location: [facility];  Service: Vascular;  Laterality: Left;  . Intraoperative arteriogram Left 03/17/2015    Procedure: INTRA OPERATIVE ARTERIOGRAM X2;  Surgeon: Caz Weaver E Taryne Kiger, MD;  Location: [facility];  Service: Vascular;  Laterality: Left;    Current Outpatient Prescriptions on File Prior to Visit  Medication Sig Dispense Refill  . acetaminophen (TYLENOL ARTHRITIS PAIN) 650 MG CR tablet Take 1,300 mg by mouth 2 (two) times daily.     . aspirin 81 MG tablet Take 1 tablet (81 mg total) by mouth daily. 30 tablet 6  . calcitRIOL (ROCALTROL) 0.5 MCG capsule Take 0.5 mcg by mouth daily.    . calcium-vitamin D (OSCAL WITH D) 500-200 MG-UNIT per tablet Take 2 tablets by mouth. Take 2 tabs Am and 1 tab Pm    . clopidogrel (PLAVIX) 75 MG tablet Take 1 tablet (75 mg total) by mouth daily. 90   tablet 3  . ezetimibe (ZETIA) 10 MG tablet Take 10 mg by mouth daily.      . fenofibrate 160 MG tablet Take 160 mg by mouth daily.    . fish oil-omega-3 fatty acids 1000 MG capsule Take 4,800 mg by mouth daily. Take 2 tabs. Am and 2 tabs Pm    . glucosamine-chondroitin 500-400 MG tablet Take 1 tablet by mouth 2 (two) times daily.     Marland Kitchen. loratadine (CLARITIN) 10 MG tablet Take 10 mg by mouth daily.    Marland Kitchen. losartan (COZAAR) 100 MG tablet Take 100 mg by mouth daily.      . Multiple Vitamins-Minerals (MULTIVITAMIN WITH MINERALS) tablet Take 1 tablet by mouth daily.    . mupirocin ointment (BACTROBAN) 2 % Place 1 application into the nose 2 (two) times daily. Needs 2 additional doses for 10 doses    . OVER THE COUNTER MEDICATION Take 120 mg by mouth every morning. Ginkgo Biloba    . oxyCODONE-acetaminophen (PERCOCET/ROXICET) 5-325 MG tablet Take 1 tablet by mouth every 6 (six) hours as needed for moderate pain. 30 tablet 0  . Potassium 99 MG TABS Take 1 tablet by mouth daily.    . ranitidine (ZANTAC) 150 MG tablet Take 150 mg by mouth 2 (two) times daily.    . traZODone (DESYREL) 50 MG tablet Take 50 mg by mouth at bedtime.    . nitroGLYCERIN (NITROSTAT) 0.4 MG SL tablet Place 1 tablet (0.4 mg total) under the tongue every 5 (five) minutes as needed for chest pain. 25 tablet 12   No current facility-administered medications on file prior to visit.     Physical exam:    Filed Vitals:   04/30/15 1253  BP: 138/78  Pulse: 70  Temp: 98.5 F (36.9 C)  TempSrc: Oral  Resp: 14  Height: 6\' 2"  (1.88 m)  Weight: 207 lb (93.895 kg)  SpO2: 99%     Left lower extremity: 2+ dorsalis pedis pulse healing incisions overall small area of clear drainage from the upper thigh incision groin incision healing well , the below-knee incision also has a punctate area with small amount of serous drainage. Otherwise this is healing well   Right lower extremity: Absent pedal pulses no open wound, 2+ right femoral pulse  Chest: Clear to auscultation bilaterally  Cardiac: Regular rate and rhythm.  Assessment:  #1slowly improving left leg swelling with improvement of claudication symptoms and patent bypass graft left leg     #2 claudication right leg   Plan: Right femoropopliteal bypass scheduled for 05/11/2015. Risks benefits possible complications and procedure details were discussed the patient today. I also discussed with him cleaning up his left leg incisions well-healed  under anesthesia if there were still some areas of suture or eschar that needed to be removed.  He is on aspirin and Plavix. He will stop his Plavix today.  Fabienne Brunsharles Kyndel Egger, MD [facility] Office: [phone] Pager: [phone]

## 2015-05-01 ENCOUNTER — Other Ambulatory Visit: Payer: Self-pay | Admitting: *Deleted

## 2015-05-06 ENCOUNTER — Encounter (HOSPITAL_COMMUNITY): Payer: Self-pay

## 2015-05-06 ENCOUNTER — Encounter (HOSPITAL_COMMUNITY)
Admission: RE | Admit: 2015-05-06 | Discharge: 2015-05-06 | Disposition: A | Discharge status: Home / Self Care | Payer: BLUE CROSS/BLUE SHIELD | Source: Ambulatory Visit | Attending: Vascular Surgery | Admitting: Vascular Surgery

## 2015-05-06 DIAGNOSIS — Z0183 Encounter for blood typing: Secondary | ICD-10-CM | POA: Diagnosis not present

## 2015-05-06 DIAGNOSIS — Z01812 Encounter for preprocedural laboratory examination: Secondary | ICD-10-CM | POA: Diagnosis present

## 2015-05-06 DIAGNOSIS — I739 Peripheral vascular disease, unspecified: Secondary | ICD-10-CM | POA: Diagnosis not present

## 2015-05-06 HISTORY — DX: Presence of spectacles and contact lenses: Z97.3

## 2015-05-06 HISTORY — DX: Personal history of other diseases of the respiratory system: Z87.09

## 2015-05-06 HISTORY — DX: Frequency of micturition: R35.0

## 2015-05-06 LAB — COMPREHENSIVE METABOLIC PANEL
ALT: 15 U/L — ABNORMAL LOW (ref 17–63)
AST: 19 U/L (ref 15–41)
Albumin: 4 g/dL (ref 3.5–5.0)
Alkaline Phosphatase: 37 U/L — ABNORMAL LOW (ref 38–126)
Anion gap: 9 (ref 5–15)
BUN: 17 mg/dL (ref 6–20)
CO2: 28 mmol/L (ref 22–32)
Calcium: 9.9 mg/dL (ref 8.9–10.3)
Chloride: 103 mmol/L (ref 101–111)
Creatinine, Ser: 1.27 mg/dL — ABNORMAL HIGH (ref 0.61–1.24)
GFR calc Af Amer: >60 mL/min (ref >60)
GFR calc non Af Amer: 59 mL/min — ABNORMAL LOW (ref >60)
Glucose, Bld: 98 mg/dL (ref 65–99)
Potassium: 3.9 mmol/L (ref 3.5–5.1)
Sodium: 140 mmol/L (ref 135–145)
Total Bilirubin: 0.3 mg/dL (ref 0.3–1.2)
Total Protein: 7.3 g/dL (ref 6.5–8.1)

## 2015-05-06 LAB — CBC
HCT: 37.6 % — ABNORMAL LOW (ref 39.0–52.0)
Hemoglobin: 12.5 g/dL — ABNORMAL LOW (ref 13.0–17.0)
MCH: 29.7 pg (ref 26.0–34.0)
MCHC: 33.2 g/dL (ref 30.0–36.0)
MCV: 89.3 fL (ref 78.0–100.0)
Platelets: 364 10*3/uL (ref 150–400)
RBC: 4.21 MIL/uL — ABNORMAL LOW (ref 4.22–5.81)
RDW: 13 % (ref 11.5–15.5)
WBC: 7 10*3/uL (ref 4.0–10.5)

## 2015-05-06 LAB — SURGICAL PCR SCREEN
MRSA, PCR: NEGATIVE
Staphylococcus aureus: NEGATIVE

## 2015-05-06 LAB — URINALYSIS, ROUTINE W REFLEX MICROSCOPIC
Bilirubin Urine: NEGATIVE
Glucose, UA: NEGATIVE mg/dL
Ketones, ur: NEGATIVE mg/dL
Leukocytes, UA: NEGATIVE
Nitrite: NEGATIVE
Protein, ur: NEGATIVE mg/dL
Specific Gravity, Urine: 1.017 (ref 1.005–1.030)
pH: 7 (ref 5.0–8.0)

## 2015-05-06 LAB — URINE MICROSCOPIC-ADD ON: Bacteria, UA: NONE SEEN

## 2015-05-06 LAB — PROTIME-INR
INR: 0.94 (ref 0.00–1.49)
Prothrombin Time: 12.8 seconds (ref 11.6–15.2)

## 2015-05-06 LAB — APTT: aPTT: 28 seconds (ref 24–37)

## 2015-05-06 NOTE — Progress Notes (Signed)
   05/06/15 1026  OBSTRUCTIVE SLEEP APNEA  Have you ever been diagnosed with sleep apnea through a sleep study? No  Do you snore loudly (loud enough to be heard through closed doors)?  1  Do you often feel tired, fatigued, or sleepy during the daytime (such as falling asleep during driving or talking to someone)? 0  Has anyone observed you stop breathing during your sleep? 1  Do you have, or are you being treated for high blood pressure? 1  BMI more than 35 kg/m2? 0  Age > 50 (1-yes) 1  Neck circumference greater than:Male 16 inches or larger, Male 17inches or larger? 0  Male Gender (Yes=1) 1  Obstructive Sleep Apnea Score 5

## 2015-05-06 NOTE — Progress Notes (Signed)
PCP - Dr. Sigmund HazelLisa Miller Cardiologist - Dr. Melburn PopperNasher  EKG- 12/2014 CXR  - denies  Echo- denies Stress test - 2016 - epic Cardiac cath - 2006  Patient denies chest pain and shortness of breat at PAT appointment.  Per patient, last dose of Plavix was 12/1 per Dr. Darrick PennaFields.  Patient sates that he is to continue taking his Aspirin 81 mg.

## 2015-05-06 NOTE — Pre-Procedure Instructions (Signed)
    Christoper AllegraSteven R Rosebrook  05/06/2015      [facility] [place] [address] AT [facility]AL TO [facility] [address] [place] [id number] Phone: [phone] Fax: [phone]  [id number] [place], [place] - [address] [place] [address] Phone: [phone] Fax: [phone]    Your procedure is scheduled on Monday, December 12th, 2016.  [facility] at [facility]:30 [facility].M.  Call this number if you have problems the morning of surgery:  [phone]   Remember:  Do not eat food or drink liquids after midnight.   Take these medicines the morning of surgery with A SIP OF WATER: Loratadine (Claritin), Ranitidine (Zantac).   Please follow MD's instructions on Aspirin and Plavix.  Stop taking: Aleve, Naproxen, Ibuprofen, Advil, Motrin, BC"s, Goody's, Fish oil, all herbal medications, and all vitamins.    Do not wear jewelry.  Do not wear lotions, powders, or colognes.  You may wear deodorant.  Men may shave face and neck.  Do not bring valuables to the hospital.  [facility] is not responsible for any belongings or valuables.  Contacts, dentures or bridgework may not be worn into surgery.  Leave your suitcase in the car.  After surgery it may be brought to your room.  For patients admitted to the hospital, discharge time will be determined by your treatment team.  Patients discharged the day of surgery will not be allowed to drive home.   Special instructions:  See attached.   Please read over the following fact sheets that you were given. Pain Booklet, Coughing and Deep Breathing, Blood Transfusion Information, MRSA Information and Surgical Site Infection Prevention

## 2015-05-08 DIAGNOSIS — Z0279 Encounter for issue of other medical certificate: Secondary | ICD-10-CM

## 2015-05-10 MED ORDER — DEXTROSE 5 % IV SOLN
1.5000 g | INTRAVENOUS | Status: AC
  Administered 2015-05-11: 1.5 g via INTRAVENOUS
  Filled 2015-05-10: qty 1.5

## 2015-05-10 NOTE — Anesthesia Preprocedure Evaluation (Signed)
Anesthesia Evaluation  Patient identified by MRN, date of birth, ID band Patient awake    Reviewed: Allergy & Precautions, NPO status , Patient's Chart, lab work & pertinent test results  Airway Mallampati: II  TM Distance: >3 FB Neck ROM: Full    Dental  (+) Dental Advisory Given   Pulmonary former smoker,    breath sounds clear to auscultation       Cardiovascular Exercise Tolerance: Poor hypertension, Pt. on medications and On Home Beta Blockers + CAD, + Cardiac Stents (LAD and LCx in 2006 stopped plavix 03/12/2015), + Peripheral Vascular Disease and + DOE   Rhythm:Regular Rate:Bradycardia     Neuro/Psych negative neurological ROS     GI/Hepatic Neg liver ROS, GERD  ,  Endo/Other  negative endocrine ROS  Renal/GU CRFRenal disease     Musculoskeletal  (+) Arthritis ,   Abdominal   Peds  Hematology  (+) anemia ,   Anesthesia Other Findings   Reproductive/Obstetrics                             Lab Results  Component Value Date   WBC 7.0 05/06/2015   HGB 12.5* 05/06/2015   HCT 37.6* 05/06/2015   MCV 89.3 05/06/2015   PLT 364 05/06/2015   Lab Results  Component Value Date   CREATININE 1.27* 05/06/2015   BUN 17 05/06/2015   NA 140 05/06/2015   K 3.9 05/06/2015   CL 103 05/06/2015   CO2 28 05/06/2015    Anesthesia Physical  Anesthesia Plan  ASA: III  Anesthesia Plan: General   Post-op Pain Management:    Induction: Intravenous  Airway Management Planned: Oral ETT  Additional Equipment:   Intra-op Plan:   Post-operative Plan: Extubation in OR  Informed Consent: I have reviewed the patients History and Physical, chart, labs and discussed the procedure including the risks, benefits and alternatives for the proposed anesthesia with the patient or authorized representative who has indicated his/her understanding and acceptance.   Dental advisory given  Plan  Discussed with: CRNA  Anesthesia Plan Comments:         Anesthesia Quick Evaluation

## 2015-05-11 ENCOUNTER — Encounter (HOSPITAL_COMMUNITY)
Admission: RE | Disposition: A | Discharge status: Home / Self Care | Payer: Self-pay | Source: Ambulatory Visit | Attending: Vascular Surgery

## 2015-05-11 ENCOUNTER — Encounter (HOSPITAL_COMMUNITY): Payer: Self-pay | Admitting: *Deleted

## 2015-05-11 ENCOUNTER — Inpatient Hospital Stay (HOSPITAL_COMMUNITY)
Admission: RE | Admit: 2015-05-11 | Discharge: 2015-05-13 | DRG: 253 | Disposition: A | Discharge status: Home / Self Care | Payer: BLUE CROSS/BLUE SHIELD | Source: Ambulatory Visit | Attending: Vascular Surgery | Admitting: Vascular Surgery

## 2015-05-11 ENCOUNTER — Inpatient Hospital Stay (HOSPITAL_COMMUNITY): Payer: BLUE CROSS/BLUE SHIELD | Admitting: Anesthesiology

## 2015-05-11 ENCOUNTER — Inpatient Hospital Stay (HOSPITAL_COMMUNITY): Payer: BLUE CROSS/BLUE SHIELD

## 2015-05-11 DIAGNOSIS — I739 Peripheral vascular disease, unspecified: Secondary | ICD-10-CM | POA: Diagnosis present

## 2015-05-11 DIAGNOSIS — D62 Acute posthemorrhagic anemia: Secondary | ICD-10-CM | POA: Diagnosis not present

## 2015-05-11 DIAGNOSIS — K219 Gastro-esophageal reflux disease without esophagitis: Secondary | ICD-10-CM | POA: Diagnosis present

## 2015-05-11 DIAGNOSIS — E785 Hyperlipidemia, unspecified: Secondary | ICD-10-CM | POA: Diagnosis present

## 2015-05-11 DIAGNOSIS — I10 Essential (primary) hypertension: Secondary | ICD-10-CM | POA: Diagnosis present

## 2015-05-11 DIAGNOSIS — Z96621 Presence of right artificial elbow joint: Secondary | ICD-10-CM | POA: Diagnosis present

## 2015-05-11 DIAGNOSIS — Z79899 Other long term (current) drug therapy: Secondary | ICD-10-CM

## 2015-05-11 DIAGNOSIS — Z419 Encounter for procedure for purposes other than remedying health state, unspecified: Secondary | ICD-10-CM

## 2015-05-11 DIAGNOSIS — Z981 Arthrodesis status: Secondary | ICD-10-CM

## 2015-05-11 DIAGNOSIS — M7989 Other specified soft tissue disorders: Secondary | ICD-10-CM | POA: Diagnosis present

## 2015-05-11 DIAGNOSIS — Z7902 Long term (current) use of antithrombotics/antiplatelets: Secondary | ICD-10-CM

## 2015-05-11 DIAGNOSIS — I2510 Atherosclerotic heart disease of native coronary artery without angina pectoris: Secondary | ICD-10-CM | POA: Diagnosis present

## 2015-05-11 DIAGNOSIS — I70211 Atherosclerosis of native arteries of extremities with intermittent claudication, right leg: Secondary | ICD-10-CM | POA: Diagnosis present

## 2015-05-11 DIAGNOSIS — Z955 Presence of coronary angioplasty implant and graft: Secondary | ICD-10-CM

## 2015-05-11 DIAGNOSIS — I70213 Atherosclerosis of native arteries of extremities with intermittent claudication, bilateral legs: Secondary | ICD-10-CM | POA: Diagnosis not present

## 2015-05-11 DIAGNOSIS — Z7982 Long term (current) use of aspirin: Secondary | ICD-10-CM

## 2015-05-11 HISTORY — PX: FEMORAL-POPLITEAL BYPASS GRAFT: SHX937

## 2015-05-11 HISTORY — PX: INTRAOPERATIVE ARTERIOGRAM: SHX5157

## 2015-05-11 HISTORY — PX: VEIN HARVEST: SHX6363

## 2015-05-11 LAB — PREPARE RBC (CROSSMATCH)

## 2015-05-11 SURGERY — BYPASS GRAFT FEMORAL-POPLITEAL ARTERY
Anesthesia: General | Site: Leg Upper | Laterality: Right

## 2015-05-11 MED ORDER — 0.9 % SODIUM CHLORIDE (POUR BTL) OPTIME
TOPICAL | Status: DC | PRN
  Administered 2015-05-11: 2000 mL

## 2015-05-11 MED ORDER — MORPHINE SULFATE (PF) 2 MG/ML IV SOLN
2.0000 mg | INTRAVENOUS | Status: DC | PRN
  Administered 2015-05-11 – 2015-05-12 (×4): 2 mg via INTRAVENOUS
  Filled 2015-05-11 (×4): qty 1

## 2015-05-11 MED ORDER — HEPARIN SODIUM (PORCINE) 1000 UNIT/ML IJ SOLN
INTRAMUSCULAR | Status: DC | PRN
  Administered 2015-05-11: 9000 [IU] via INTRAVENOUS
  Administered 2015-05-11: 3000 [IU] via INTRAVENOUS

## 2015-05-11 MED ORDER — OXYCODONE HCL 5 MG PO TABS
5.0000 mg | ORAL_TABLET | ORAL | Status: DC | PRN
  Administered 2015-05-11 – 2015-05-12 (×4): 5 mg via ORAL
  Filled 2015-05-11 (×4): qty 1

## 2015-05-11 MED ORDER — HYDROMORPHONE HCL 1 MG/ML IJ SOLN
INTRAMUSCULAR | Status: AC
  Filled 2015-05-11: qty 1

## 2015-05-11 MED ORDER — LABETALOL HCL 5 MG/ML IV SOLN: 10.0000 mg | INTRAVENOUS | Status: DC | PRN

## 2015-05-11 MED ORDER — ALUM & MAG HYDROXIDE-SIMETH 200-200-20 MG/5ML PO SUSP
15.0000 mL | ORAL | Status: DC | PRN
  Administered 2015-05-11: 30 mL via ORAL
  Filled 2015-05-11: qty 30

## 2015-05-11 MED ORDER — MAGNESIUM HYDROXIDE 400 MG/5ML PO SUSP: 30.0000 mL | Freq: Every day | ORAL | Status: DC | PRN

## 2015-05-11 MED ORDER — SODIUM CHLORIDE 0.9 % IJ SOLN
INTRAMUSCULAR | Status: AC
  Filled 2015-05-11: qty 10

## 2015-05-11 MED ORDER — CHLORHEXIDINE GLUCONATE CLOTH 2 % EX PADS: 6.0000 | MEDICATED_PAD | Freq: Once | CUTANEOUS | Status: DC

## 2015-05-11 MED ORDER — LORATADINE 10 MG PO TABS
10.0000 mg | ORAL_TABLET | Freq: Every day | ORAL | Status: DC
  Administered 2015-05-13: 10 mg via ORAL
  Filled 2015-05-11 (×2): qty 1

## 2015-05-11 MED ORDER — SUCCINYLCHOLINE CHLORIDE 20 MG/ML IJ SOLN
INTRAMUSCULAR | Status: AC
  Filled 2015-05-11: qty 1

## 2015-05-11 MED ORDER — FENTANYL CITRATE (PF) 250 MCG/5ML IJ SOLN
INTRAMUSCULAR | Status: AC
  Filled 2015-05-11: qty 5

## 2015-05-11 MED ORDER — PHENOL 1.4 % MT LIQD: 1.0000 | OROMUCOSAL | Status: DC | PRN

## 2015-05-11 MED ORDER — ROCURONIUM BROMIDE 50 MG/5ML IV SOLN
INTRAVENOUS | Status: AC
  Filled 2015-05-11: qty 1

## 2015-05-11 MED ORDER — DEXAMETHASONE SODIUM PHOSPHATE 4 MG/ML IJ SOLN
INTRAMUSCULAR | Status: AC
  Filled 2015-05-11: qty 1

## 2015-05-11 MED ORDER — PHENYLEPHRINE HCL 10 MG/ML IJ SOLN
10.0000 mg | INTRAVENOUS | Status: DC | PRN
  Administered 2015-05-11: 25 ug/min via INTRAVENOUS
  Administered 2015-05-11 (×2): via INTRAVENOUS

## 2015-05-11 MED ORDER — PROMETHAZINE HCL 25 MG/ML IJ SOLN
6.2500 mg | INTRAMUSCULAR | Status: DC | PRN
Start: 2015-05-11 — End: 2015-05-11

## 2015-05-11 MED ORDER — OMEGA-3 FATTY ACIDS 1000 MG PO CAPS: 4800.0000 mg | ORAL_CAPSULE | Freq: Every day | ORAL | Status: DC

## 2015-05-11 MED ORDER — ONDANSETRON HCL 4 MG/2ML IJ SOLN
INTRAMUSCULAR | Status: DC | PRN
  Administered 2015-05-11: 4 mg via INTRAVENOUS

## 2015-05-11 MED ORDER — LIDOCAINE HCL (CARDIAC) 20 MG/ML IV SOLN
INTRAVENOUS | Status: DC | PRN
  Administered 2015-05-11: 100 mg via INTRAVENOUS

## 2015-05-11 MED ORDER — SODIUM CHLORIDE 0.9 % IV SOLN
INTRAVENOUS | Status: DC
  Administered 2015-05-11: 23:00:00 via INTRAVENOUS

## 2015-05-11 MED ORDER — PROTAMINE SULFATE 10 MG/ML IV SOLN
INTRAVENOUS | Status: DC | PRN
  Administered 2015-05-11: 100 mg via INTRAVENOUS

## 2015-05-11 MED ORDER — SODIUM CHLORIDE 0.9 % IV SOLN
INTRAVENOUS | Status: DC
  Administered 2015-05-11: 13:00:00 via INTRAVENOUS

## 2015-05-11 MED ORDER — HYDROMORPHONE HCL 1 MG/ML IJ SOLN
0.2500 mg | INTRAMUSCULAR | Status: DC | PRN
  Administered 2015-05-11 (×2): 0.25 mg via INTRAVENOUS

## 2015-05-11 MED ORDER — METOPROLOL TARTRATE 1 MG/ML IV SOLN: 2.0000 mg | INTRAVENOUS | Status: DC | PRN

## 2015-05-11 MED ORDER — NITROGLYCERIN 0.4 MG SL SUBL: 0.4000 mg | SUBLINGUAL_TABLET | SUBLINGUAL | Status: DC | PRN

## 2015-05-11 MED ORDER — EPHEDRINE SULFATE 50 MG/ML IJ SOLN
INTRAMUSCULAR | Status: DC | PRN
  Administered 2015-05-11: 5 mg via INTRAVENOUS

## 2015-05-11 MED ORDER — FENTANYL CITRATE (PF) 100 MCG/2ML IJ SOLN
INTRAMUSCULAR | Status: DC | PRN
  Administered 2015-05-11 (×2): 50 ug via INTRAVENOUS
  Administered 2015-05-11: 100 ug via INTRAVENOUS
  Administered 2015-05-11: 25 ug via INTRAVENOUS
  Administered 2015-05-11 (×5): 50 ug via INTRAVENOUS

## 2015-05-11 MED ORDER — MEPERIDINE HCL 25 MG/ML IJ SOLN: 6.2500 mg | INTRAMUSCULAR | Status: DC | PRN

## 2015-05-11 MED ORDER — GUAIFENESIN-DM 100-10 MG/5ML PO SYRP: 15.0000 mL | ORAL_SOLUTION | ORAL | Status: DC | PRN

## 2015-05-11 MED ORDER — TRAZODONE HCL 50 MG PO TABS
50.0000 mg | ORAL_TABLET | Freq: Every day | ORAL | Status: DC
  Administered 2015-05-11 – 2015-05-12 (×2): 50 mg via ORAL
  Filled 2015-05-11 (×2): qty 1

## 2015-05-11 MED ORDER — LACTATED RINGERS IV SOLN
INTRAVENOUS | Status: DC | PRN
  Administered 2015-05-11: 11:00:00 via INTRAVENOUS

## 2015-05-11 MED ORDER — DEXAMETHASONE SODIUM PHOSPHATE 4 MG/ML IJ SOLN
INTRAMUSCULAR | Status: DC | PRN
  Administered 2015-05-11: 4 mg via INTRAVENOUS

## 2015-05-11 MED ORDER — ONDANSETRON HCL 4 MG/2ML IJ SOLN
4.0000 mg | Freq: Once | INTRAMUSCULAR | Status: AC
  Administered 2015-05-11: 4 mg via INTRAVENOUS
  Filled 2015-05-11: qty 2

## 2015-05-11 MED ORDER — ONDANSETRON HCL 4 MG/2ML IJ SOLN
4.0000 mg | Freq: Four times a day (QID) | INTRAMUSCULAR | Status: DC | PRN
  Administered 2015-05-11: 4 mg via INTRAVENOUS
  Filled 2015-05-11: qty 2

## 2015-05-11 MED ORDER — SODIUM CHLORIDE 0.9 % IV SOLN
INTRAVENOUS | Status: DC | PRN
  Administered 2015-05-11: 500 mL

## 2015-05-11 MED ORDER — ONDANSETRON HCL 4 MG/2ML IJ SOLN
4.0000 mg | INTRAMUSCULAR | Status: DC | PRN
  Administered 2015-05-12 (×2): 4 mg via INTRAVENOUS
  Filled 2015-05-11 (×2): qty 2

## 2015-05-11 MED ORDER — PROPOFOL 10 MG/ML IV BOLUS
INTRAVENOUS | Status: AC
  Filled 2015-05-11: qty 20

## 2015-05-11 MED ORDER — LACTATED RINGERS IV SOLN
INTRAVENOUS | Status: DC | PRN
  Administered 2015-05-11 (×3): via INTRAVENOUS

## 2015-05-11 MED ORDER — EZETIMIBE 10 MG PO TABS
10.0000 mg | ORAL_TABLET | Freq: Every day | ORAL | Status: DC
  Administered 2015-05-11 – 2015-05-13 (×3): 10 mg via ORAL
  Filled 2015-05-11 (×3): qty 1

## 2015-05-11 MED ORDER — LIDOCAINE HCL (CARDIAC) 20 MG/ML IV SOLN
INTRAVENOUS | Status: AC
  Filled 2015-05-11: qty 5

## 2015-05-11 MED ORDER — DEXTROSE 5 % IV SOLN
1.5000 g | INTRAVENOUS | Status: AC
  Administered 2015-05-11: 1.5 g via INTRAVENOUS
  Filled 2015-05-11: qty 1.5

## 2015-05-11 MED ORDER — DEXTROSE 5 % IV SOLN
1.5000 g | Freq: Two times a day (BID) | INTRAVENOUS | Status: AC
  Administered 2015-05-11 – 2015-05-12 (×2): 1.5 g via INTRAVENOUS
  Filled 2015-05-11 (×2): qty 1.5

## 2015-05-11 MED ORDER — HEPARIN SODIUM (PORCINE) 1000 UNIT/ML IJ SOLN
INTRAMUSCULAR | Status: AC
  Filled 2015-05-11: qty 1

## 2015-05-11 MED ORDER — PHENYLEPHRINE HCL 10 MG/ML IJ SOLN
INTRAMUSCULAR | Status: DC | PRN
  Administered 2015-05-11 (×2): 40 ug via INTRAVENOUS
  Administered 2015-05-11: 80 ug via INTRAVENOUS

## 2015-05-11 MED ORDER — CALCITRIOL 0.5 MCG PO CAPS
0.5000 ug | ORAL_CAPSULE | Freq: Every day | ORAL | Status: DC
  Administered 2015-05-11 – 2015-05-13 (×3): 0.5 ug via ORAL
  Filled 2015-05-11: qty 2
  Filled 2015-05-11 (×3): qty 1

## 2015-05-11 MED ORDER — PHENYLEPHRINE 40 MCG/ML (10ML) SYRINGE FOR IV PUSH (FOR BLOOD PRESSURE SUPPORT)
PREFILLED_SYRINGE | INTRAVENOUS | Status: AC
  Filled 2015-05-11: qty 10

## 2015-05-11 MED ORDER — PANTOPRAZOLE SODIUM 40 MG PO TBEC
40.0000 mg | DELAYED_RELEASE_TABLET | Freq: Every day | ORAL | Status: DC
  Administered 2015-05-12 – 2015-05-13 (×2): 40 mg via ORAL
  Filled 2015-05-11 (×3): qty 1

## 2015-05-11 MED ORDER — MIDAZOLAM HCL 5 MG/5ML IJ SOLN
INTRAMUSCULAR | Status: DC | PRN
  Administered 2015-05-11: 2 mg via INTRAVENOUS

## 2015-05-11 MED ORDER — HYDRALAZINE HCL 20 MG/ML IJ SOLN: 5.0000 mg | INTRAMUSCULAR | Status: DC | PRN

## 2015-05-11 MED ORDER — SODIUM CHLORIDE 0.9 % IV SOLN
500.0000 mL | Freq: Once | INTRAVENOUS | Status: AC | PRN
  Administered 2015-05-11: 500 mL via INTRAVENOUS

## 2015-05-11 MED ORDER — ALBUMIN HUMAN 5 % IV SOLN
INTRAVENOUS | Status: DC | PRN
  Administered 2015-05-11 (×4): via INTRAVENOUS

## 2015-05-11 MED ORDER — BISACODYL 10 MG RE SUPP: 10.0000 mg | Freq: Every day | RECTAL | Status: DC | PRN

## 2015-05-11 MED ORDER — EPHEDRINE SULFATE 50 MG/ML IJ SOLN
INTRAMUSCULAR | Status: AC
  Filled 2015-05-11: qty 1

## 2015-05-11 MED ORDER — THROMBIN 20000 UNITS EX SOLR
CUTANEOUS | Status: AC
  Filled 2015-05-11: qty 20000

## 2015-05-11 MED ORDER — ACETAMINOPHEN 325 MG RE SUPP
325.0000 mg | RECTAL | Status: DC | PRN
  Filled 2015-05-11: qty 2

## 2015-05-11 MED ORDER — FENOFIBRATE 160 MG PO TABS
160.0000 mg | ORAL_TABLET | Freq: Every day | ORAL | Status: DC
  Administered 2015-05-11 – 2015-05-13 (×3): 160 mg via ORAL
  Filled 2015-05-11 (×3): qty 1

## 2015-05-11 MED ORDER — GLUCOSAMINE-CHONDROITIN 500-400 MG PO TABS: 1.0000 | ORAL_TABLET | Freq: Two times a day (BID) | ORAL | Status: DC

## 2015-05-11 MED ORDER — ROCURONIUM BROMIDE 100 MG/10ML IV SOLN
INTRAVENOUS | Status: DC | PRN
  Administered 2015-05-11: 10 mg via INTRAVENOUS
  Administered 2015-05-11: 20 mg via INTRAVENOUS
  Administered 2015-05-11: 30 mg via INTRAVENOUS
  Administered 2015-05-11 (×3): 20 mg via INTRAVENOUS

## 2015-05-11 MED ORDER — ASPIRIN 81 MG PO CHEW
81.0000 mg | CHEWABLE_TABLET | Freq: Every day | ORAL | Status: DC
  Administered 2015-05-12 – 2015-05-13 (×2): 81 mg via ORAL
  Filled 2015-05-11 (×2): qty 1

## 2015-05-11 MED ORDER — PROPOFOL 10 MG/ML IV BOLUS
INTRAVENOUS | Status: DC | PRN
  Administered 2015-05-11: 160 mg via INTRAVENOUS
  Administered 2015-05-11: 40 mg via INTRAVENOUS

## 2015-05-11 MED ORDER — MIDAZOLAM HCL 2 MG/2ML IJ SOLN
INTRAMUSCULAR | Status: AC
  Filled 2015-05-11: qty 2

## 2015-05-11 MED ORDER — NEOSTIGMINE METHYLSULFATE 10 MG/10ML IV SOLN
INTRAVENOUS | Status: DC | PRN
  Administered 2015-05-11: 4 mg via INTRAVENOUS

## 2015-05-11 MED ORDER — ADULT MULTIVITAMIN W/MINERALS CH
1.0000 | ORAL_TABLET | Freq: Every day | ORAL | Status: DC
  Administered 2015-05-12 – 2015-05-13 (×2): 1 via ORAL
  Filled 2015-05-11 (×3): qty 1

## 2015-05-11 MED ORDER — ACETAMINOPHEN 325 MG PO TABS
325.0000 mg | ORAL_TABLET | ORAL | Status: DC | PRN
  Administered 2015-05-12: 325 mg via ORAL
  Filled 2015-05-11: qty 1

## 2015-05-11 MED ORDER — GLYCOPYRROLATE 0.2 MG/ML IJ SOLN
INTRAMUSCULAR | Status: DC | PRN
  Administered 2015-05-11 (×2): 0.1 mg via INTRAVENOUS
  Administered 2015-05-11: 0.6 mg via INTRAVENOUS

## 2015-05-11 MED ORDER — IOHEXOL 300 MG/ML  SOLN
INTRAMUSCULAR | Status: DC | PRN
  Administered 2015-05-11: 27 mL via INTRA_ARTERIAL

## 2015-05-11 MED ORDER — GLYCOPYRROLATE 0.2 MG/ML IJ SOLN
INTRAMUSCULAR | Status: AC
  Filled 2015-05-11: qty 1

## 2015-05-11 MED ORDER — SODIUM CHLORIDE 0.9 % IV SOLN: Freq: Once | INTRAVENOUS | Status: DC

## 2015-05-11 MED ORDER — OMEGA-3-ACID ETHYL ESTERS 1 G PO CAPS
1.0000 g | ORAL_CAPSULE | Freq: Every day | ORAL | Status: DC
  Administered 2015-05-12 – 2015-05-13 (×2): 1 g via ORAL
  Filled 2015-05-11 (×3): qty 1

## 2015-05-11 MED ORDER — DOCUSATE SODIUM 100 MG PO CAPS
100.0000 mg | ORAL_CAPSULE | Freq: Every day | ORAL | Status: DC
  Administered 2015-05-12 – 2015-05-13 (×2): 100 mg via ORAL
  Filled 2015-05-11 (×2): qty 1

## 2015-05-11 MED ORDER — POTASSIUM CHLORIDE CRYS ER 20 MEQ PO TBCR: 20.0000 meq | EXTENDED_RELEASE_TABLET | Freq: Every day | ORAL | Status: DC | PRN

## 2015-05-11 MED ORDER — SUCCINYLCHOLINE CHLORIDE 20 MG/ML IJ SOLN
INTRAMUSCULAR | Status: DC | PRN
  Administered 2015-05-11: 80 mg via INTRAVENOUS

## 2015-05-11 SURGICAL SUPPLY — 56 items
BANDAGE ESMARK 6X9 LF (GAUZE/BANDAGES/DRESSINGS) IMPLANT
BNDG CMPR 9X6 STRL LF SNTH (GAUZE/BANDAGES/DRESSINGS)
BNDG ESMARK 6X9 LF (GAUZE/BANDAGES/DRESSINGS)
CANISTER SUCTION 2500CC (MISCELLANEOUS) ×3 IMPLANT
CANNULA VESSEL 3MM 2 BLNT TIP (CANNULA) ×3 IMPLANT
CLIP TI MEDIUM 24 (CLIP) ×3 IMPLANT
CLIP TI WIDE RED SMALL 24 (CLIP) ×3 IMPLANT
CUFF TOURNIQUET SINGLE 24IN (TOURNIQUET CUFF) IMPLANT
CUFF TOURNIQUET SINGLE 34IN LL (TOURNIQUET CUFF) IMPLANT
CUFF TOURNIQUET SINGLE 44IN (TOURNIQUET CUFF) IMPLANT
DRAIN SNY WOU (WOUND CARE) IMPLANT
DRAPE PROXIMA HALF (DRAPES) IMPLANT
DRAPE X-RAY CASS 24X20 (DRAPES) ×1 IMPLANT
ELECT REM PT RETURN 9FT ADLT (ELECTROSURGICAL) ×3
ELECTRODE REM PT RTRN 9FT ADLT (ELECTROSURGICAL) ×2 IMPLANT
EVACUATOR SILICONE 100CC (DRAIN) IMPLANT
GLOVE BIO SURGEON STRL SZ 6.5 (GLOVE) ×5 IMPLANT
GLOVE BIO SURGEON STRL SZ7.5 (GLOVE) ×4 IMPLANT
GLOVE BIOGEL PI IND STRL 6.5 (GLOVE) IMPLANT
GLOVE BIOGEL PI IND STRL 8 (GLOVE) IMPLANT
GLOVE BIOGEL PI INDICATOR 6.5 (GLOVE) ×6
GLOVE BIOGEL PI INDICATOR 8 (GLOVE) ×2
GLOVE ECLIPSE 6.5 STRL STRAW (GLOVE) ×4 IMPLANT
GLOVE SURG SS PI 7.0 STRL IVOR (GLOVE) ×1 IMPLANT
GOWN STRL REIN XL XLG (GOWN DISPOSABLE) ×1 IMPLANT
GOWN STRL REUS W/ TWL LRG LVL3 (GOWN DISPOSABLE) ×6 IMPLANT
GOWN STRL REUS W/TWL LRG LVL3 (GOWN DISPOSABLE) ×21
KIT BASIN OR (CUSTOM PROCEDURE TRAY) ×3 IMPLANT
KIT ROOM TURNOVER OR (KITS) ×3 IMPLANT
LIQUID BAND (GAUZE/BANDAGES/DRESSINGS) ×3 IMPLANT
NS IRRIG 1000ML POUR BTL (IV SOLUTION) ×6 IMPLANT
PACK PERIPHERAL VASCULAR (CUSTOM PROCEDURE TRAY) ×3 IMPLANT
PAD ARMBOARD 7.5X6 YLW CONV (MISCELLANEOUS) ×6 IMPLANT
PADDING CAST COTTON 6X4 STRL (CAST SUPPLIES) IMPLANT
SET COLLECT BLD 21X3/4 12 (NEEDLE) ×1 IMPLANT
SPONGE LAP 18X18 X RAY DECT (DISPOSABLE) ×2 IMPLANT
SPONGE SURGIFOAM ABS GEL 100 (HEMOSTASIS) IMPLANT
STAPLER VISISTAT 35W (STAPLE) IMPLANT
STOPCOCK 4 WAY LG BORE MALE ST (IV SETS) ×1 IMPLANT
SUT PROLENE 5 0 C 1 24 (SUTURE) ×8 IMPLANT
SUT PROLENE 6 0 CC (SUTURE) ×5 IMPLANT
SUT PROLENE 7 0 BV 1 (SUTURE) IMPLANT
SUT PROLENE 7 0 BV1 MDA (SUTURE) IMPLANT
SUT SILK 2 0 SH (SUTURE) ×4 IMPLANT
SUT SILK 3 0 (SUTURE) ×9
SUT SILK 3-0 18XBRD TIE 12 (SUTURE) IMPLANT
SUT VIC AB 2-0 SH 27 (SUTURE) ×3
SUT VIC AB 2-0 SH 27XBRD (SUTURE) IMPLANT
SUT VIC AB 3-0 SH 27 (SUTURE) ×24
SUT VIC AB 3-0 SH 27X BRD (SUTURE) ×8 IMPLANT
SUT VIC AB 4-0 PS2 27 (SUTURE) ×8 IMPLANT
TAPE UMBILICAL COTTON 1/8X30 (MISCELLANEOUS) IMPLANT
TRAY FOLEY W/METER SILVER 16FR (SET/KITS/TRAYS/PACK) ×3 IMPLANT
TUBING EXTENTION W/L.L. (IV SETS) ×1 IMPLANT
UNDERPAD 30X30 INCONTINENT (UNDERPADS AND DIAPERS) ×3 IMPLANT
WATER STERILE IRR 1000ML POUR (IV SOLUTION) ×3 IMPLANT

## 2015-05-11 NOTE — Transfer of Care (Signed)
Immediate Anesthesia Transfer of Care Note  Patient: Derrick [name]  Procedure(s) Performed: Procedure(s):  RIGHT FEMORAL-BELOW THE KNEE POPLITEAL ARTERY BYPASS GRAFT USING NON REVERSE RIGHT GREATER SAPHENOUS VEIN (Right) WITH NON REVERSE RIGHT GREATER SAPHENOUS VEIN HARVEST (Right) INTRA OPERATIVE ARTERIOGRAM (Right)  Patient Location: PACU  Anesthesia Type:General  Level of Consciousness: awake, alert  and oriented  Airway & Oxygen Therapy: Patient Spontanous Breathing and Patient connected to nasal cannula oxygen  Post-op Assessment: Report given to RN, Post -op Vital signs reviewed and stable and Patient moving all extremities  Post vital signs: Reviewed and stable  Last Vitals:  Filed Vitals:   05/11/15 0547  BP: 160/63  Pulse: 59  Temp: 36.4 C  Resp: 18    Complications: No apparent anesthesia complications

## 2015-05-11 NOTE — Progress Notes (Signed)
     Alert and oriented x 3 Palpable Right DP, doppler PT Incisions C/D/I Groin soft   S/P right fem pop by pass  Rica Heather MAUREEN PA-C

## 2015-05-11 NOTE — Anesthesia Postprocedure Evaluation (Signed)
Anesthesia Post Note  Patient: Derrick [name]  Procedure(s) Performed: Procedure(s) (LRB):  RIGHT FEMORAL-BELOW THE KNEE POPLITEAL ARTERY BYPASS GRAFT USING NON REVERSE RIGHT GREATER SAPHENOUS VEIN (Right) WITH NON REVERSE RIGHT GREATER SAPHENOUS VEIN HARVEST (Right) INTRA OPERATIVE ARTERIOGRAM (Right)  Patient location during evaluation: PACU Anesthesia Type: General Level of consciousness: sedated and patient cooperative Pain management: pain level controlled Vital Signs Assessment: post-procedure vital signs reviewed and stable Respiratory status: spontaneous breathing Cardiovascular status: stable Anesthetic complications: no    Last Vitals:  Filed Vitals:   05/11/15 1400 05/11/15 1415  BP: 110/60 110/65  Pulse: 65 86  Temp: 36.4 C   Resp: 13 13    Last Pain:  Filed Vitals:   05/11/15 1419  PainSc: 4                  Seyed Heffley [employer]

## 2015-05-11 NOTE — Op Note (Signed)
Procedure: Right femoral to below knee popliteal bypass with non-reversed ipsilateral great saphenous vein  Preoperative diagnosis: Claudication  Postoperative diagnosis: Same  Anesthesia: General  Asst.: Cari Carawayhris Dickson, MD, Doreatha MassedSamantha Rhyne, PA-C  Operative findings:  3 mm vein  Operative details: After obtaining informed consent, the patient was taken to the operating room. The patient was placed in supine position on the operating room table. After induction of general anesthesia and endotracheal intubation, a Foley catheter was placed. Next, the patient's entire right lower extremity was prepped and draped in the usual sterile fashion. A longitudinal incision was then made in the right groin and carried down through the subcutaneous tissues to expose the right common femoral artery.   The common femoral artery was dissected free circumferentially. The was some posteromedial calcified plaque but the anterolateral wall was fairly soft and had a good quality pulse. The distal external iliac artery was dissected free circumferentially underneath the inguinal ligament. There was a good pulse within this.   A vessel loop was also placed around the distal external iliac artery. The profunda was dissected free circumferentially at its second branch point where the plaque in the profunda seemed to thin out.  The SFA was also dissected free circumferentially and a vessel loop placed around this.  Next the saphenofemoral junction was identified in the medial portion of the groin incision and this was harvested through several skip incisions on the medial aspect of the leg.  Side branches were ligated and divided between silk ties or clips.  The vein was fairly uniform 3 mm diameter although it tapered some in the distal below knee segment.  The vein harvest incision was deepened into the fascia at the below knee segment and the below knee popliteal space was entered.  The popliteal artery was dissected free  circumferentially.  It was soft on palpation.  A tunnel was then created between the heads of the gastrocnemius muscle subsartorial up to the groin.  During the tunneling the profunda vein was injured and require repair in 2 spots with a running 5 0 prolene suture.  The saphenous vein was ligated distally and at the saphenofemoral junction overesewn with a running 5 0 prolene suture.  The vein was gently distended with heparinized saline and inspected for hemostasis.    The patient was given 9000 units of heparin.  After appropriate circulation time, the distal right external iliac artery was controlled with a vessel loop. The distal common femoral artery was controlled with a vessel loop.   A longitudinal opening was made in the common femoral artery on its anterior surface.   The common femoral was then controlled proximally and distally with vessel loops and a longitudinal opening was made in the common femoral artery just above the femoral bifurcation. The vein was placed in a non reversed configuration.    The vein was spatulated and sewn end to side to the artery using a running 5 0 Prolene.  Just prior to completion anastomosis everything was forebled backbled and thoroughly flushed. Proximal clamp and distal clamps were removed and there was good pulsatile flow in the profunda femoris artery immediately. The SFA was chronically occluded. The vein valves were lysed with a valvulotome.   The graft was then brought through the subsartorial tunnel down to the below-knee popliteal artery after marking for orientation. The below-knee popliteal artery was controlled proximally and distally with a Henley clamp. A longitudinal opening was made in the distal below-knee popliteal artery in an area that  was fairly free of calcification. The graft was then cut to length and spatulated and sewn end of graft to side of artery using running 6-0 Prolene suture.  At completion of the anastomosis everything was forebled  backbled and thoroughly flushed. The remainder of the anastomosis was completed and all clamps were removed restoring pulsatile flow to the below-knee popliteal artery. An intraoperative arteriogram was obtained with a 21 gauge butterfly in the proximal vein graft.  This showed a patent distal anastomosis with 3 vessel runoff.  The patient had monophasic to biphasic Doppler flow in the posterior tibial and anterior tibial areas of the foot.  One repair stitch was placed in the medial wall of the proximal anastomosis.  The patient had been given an additional 3000 units of heparin during the case.  The patient was give 100 mg of protamine.  There was still some venous bleeding coming from the tunnel in the popliteal space which was controlled with packing and pressure.  After hemostasis was obtained, the deep layers and subcutaneous layers of the below-knee popliteal incision were closed with running 3-0 Vicryl suture. The skin was closed with a 4 0 vicryl subcuticular stitch.   The saphenectomy incisions were closed with running 3 0 vicryl follow by 4 0 vicryl subcuticular.  The groin was inspected and found to be hemostatic. This was then closed in multiple layers of running 2 0 and 3-0 Vicryl suture and 4-0 subcuticular stitch. The patient tolerated the procedure well and there were no complications. Instrument sponge and needle counts were correct at the end of the case. Patient was taken to the recovery room in stable condition.  Fabienne Bruns, MD [facility] Office: [phone] Pager: [phone]

## 2015-05-11 NOTE — Care Management Note (Signed)
Case Management Note  Patient Details  Name: Christoper AllegraSteven R Alcaraz MRN: [id number] Date of Birth: 12/20/1955  Subjective/Objective:                 Admitted s/p Right femoral to below knee popliteal bypass with non-reversed ipsilateral great saphenous vein 05/11/15. Independent with ADL's. WUX:[id number]:rolling walker.  Action/Plan: Return to home when medically stable. CM to f/u with d/c disposition.  Expected Discharge Date:                  Expected Discharge Plan:  Home/Self Care  In-House Referral:     Discharge planning Services  CM Consult  Post Acute Care Choice:    Choice offered to:     DME Arranged:    DME Agency:     HH Arranged:    HH Agency:     Status of Service:  In process, will continue to follow  Medicare Important Message Given:    Date Medicare IM Given:    Medicare IM give by:    Date Additional Medicare IM Given:    Additional Medicare Important Message give by:     If discussed at Long Length of Stay Meetings, dates discussed:    Additional Comments: CM @ bedside,  spoke with pt/wife regarding d/c plan. Wife states plan is for husband  to be d/c to home and assume care for self with assist from her if needed.  Vara GuardianDonna Macbride (Spouse)  [phone]  Gae GallopCole, Trent Gabler [place], ArizonaRN,BSN,CM [phone] 05/11/2015, 3:57 PM

## 2015-05-11 NOTE — H&P (View-Only) (Signed)
Patient is a 59 year old male who underwent left femoral to posterior tibial bypass and a sequential graft to the below-knee popliteal artery on October 18. He presents today for postoperative follow-up.  He still has some swelling in the left leg although this is improved. He has been walking better with improved claudication symptoms. Incisions are essentially healed at this point. He describes claudication symptoms in the right leg as well. Previous arteriogram showed multiple exophytic calcific obstructions of his right superficial femoral artery and haziness of the above-knee popliteal artery suggesting high-grade stenosis. He had a patent below-knee popliteal artery with good runoff. At this point he wishes to have the right leg bypass performed.  Past Medical History  Diagnosis Date  . Hyperparathyroidism   . Hypertension   . Dyslipidemia   . OA (osteoarthritis)   . Metatarsalgia   . Hyperlipidemia     statin intolerant  . GERD (gastroesophageal reflux disease)   . Coronary artery disease 2006    stents x2=LAD LCX; totally occluded RCA  . Seasonal allergies   . Insomnia   . Peripheral vascular disease [facility])     Past Surgical History  Procedure Laterality Date  . Cervical spine surgery  2004  . Cardiac catheterization  10/28/2004    circ and mid LAD chyper stents  . Cervical fusion  [id number]  . Incision and drainage abscess posterior cervicalspine  2009  . Elbow arthroplasty  1994    right  . Colonoscopy    . Shoulder open rotator cuff repair Right 04/23/2013    Procedure: RIGHT TWO TENDON ROTATOR CUFF REPAIR  SUBACROMIAL DECOMPRESSION AND OPEN DISTAL CLAVICLE RESECTION;  Surgeon: Wyn Forster., MD;  Location: [facility];  Service: Orthopedics;  Laterality: Right;  . Peripheral vascular catheterization N/A 01/16/2015    Procedure: Abdominal Aortogram;  Surgeon: Sherren Kerns, MD;  Location: [facility] INVASIVE CV LAB;  Service: Cardiovascular;  Laterality: N/A;  .  Stents placed   2006  . Coronary angioplasty  2006    stents placed   . Femoral-popliteal bypass graft Left 03/17/2015    Procedure: BYPASS GRAFT FEMORAL-POPLITEAL ARTERY-LEFT LEG AND POSTERIOR TIBIAL SEQUENTIAL GRAFT TO BELOW KNEE POPLITEAL ARTERY;  Surgeon: Sherren Kerns, MD;  Location: [facility] OR;  Service: Vascular;  Laterality: Left;  . Endarterectomy femoral Left 03/17/2015    Procedure: ENDARTERECTOMY COMMON FEMORAL WITH PROFUNDOPLASTY;  Surgeon: Sherren Kerns, MD;  Location: [facility] OR;  Service: Vascular;  Laterality: Left;  . Patch angioplasty Left 03/17/2015    Procedure: VEIN PATCH ANGIOPLASTY COMMON FEMORAL ARTERY;  Surgeon: Sherren Kerns, MD;  Location: [facility] OR;  Service: Vascular;  Laterality: Left;  . Intraoperative arteriogram Left 03/17/2015    Procedure: INTRA OPERATIVE ARTERIOGRAM X2;  Surgeon: Sherren Kerns, MD;  Location: [facility] OR;  Service: Vascular;  Laterality: Left;    Current Outpatient Prescriptions on File Prior to Visit  Medication Sig Dispense Refill  . acetaminophen (TYLENOL ARTHRITIS PAIN) 650 MG CR tablet Take 1,300 mg by mouth 2 (two) times daily.     Marland Kitchen aspirin 81 MG tablet Take 1 tablet (81 mg total) by mouth daily. 30 tablet 6  . calcitRIOL (ROCALTROL) 0.5 MCG capsule Take 0.5 mcg by mouth daily.    . calcium-vitamin D (OSCAL WITH D) 500-200 MG-UNIT per tablet Take 2 tablets by mouth. Take 2 tabs Am and 1 tab Pm    . clopidogrel (PLAVIX) 75 MG tablet Take 1 tablet (75 mg total) by mouth daily. 90  tablet 3  . ezetimibe (ZETIA) 10 MG tablet Take 10 mg by mouth daily.      . fenofibrate 160 MG tablet Take 160 mg by mouth daily.    . fish oil-omega-3 fatty acids 1000 MG capsule Take 4,800 mg by mouth daily. Take 2 tabs. Am and 2 tabs Pm    . glucosamine-chondroitin 500-400 MG tablet Take 1 tablet by mouth 2 (two) times daily.     Marland Kitchen. loratadine (CLARITIN) 10 MG tablet Take 10 mg by mouth daily.    Marland Kitchen. losartan (COZAAR) 100 MG tablet Take 100 mg by mouth daily.      . Multiple Vitamins-Minerals (MULTIVITAMIN WITH MINERALS) tablet Take 1 tablet by mouth daily.    . mupirocin ointment (BACTROBAN) 2 % Place 1 application into the nose 2 (two) times daily. Needs 2 additional doses for 10 doses    . OVER THE COUNTER MEDICATION Take 120 mg by mouth every morning. Ginkgo Biloba    . oxyCODONE-acetaminophen (PERCOCET/ROXICET) 5-325 MG tablet Take 1 tablet by mouth every 6 (six) hours as needed for moderate pain. 30 tablet 0  . Potassium 99 MG TABS Take 1 tablet by mouth daily.    . ranitidine (ZANTAC) 150 MG tablet Take 150 mg by mouth 2 (two) times daily.    . traZODone (DESYREL) 50 MG tablet Take 50 mg by mouth at bedtime.    . nitroGLYCERIN (NITROSTAT) 0.4 MG SL tablet Place 1 tablet (0.4 mg total) under the tongue every 5 (five) minutes as needed for chest pain. 25 tablet 12   No current facility-administered medications on file prior to visit.     Physical exam:    Filed Vitals:   04/30/15 1253  BP: 138/78  Pulse: 70  Temp: 98.5 F (36.9 C)  TempSrc: Oral  Resp: 14  Height: 6\' 2"  (1.88 m)  Weight: 207 lb (93.895 kg)  SpO2: 99%     Left lower extremity: 2+ dorsalis pedis pulse healing incisions overall small area of clear drainage from the upper thigh incision groin incision healing well , the below-knee incision also has a punctate area with small amount of serous drainage. Otherwise this is healing well   Right lower extremity: Absent pedal pulses no open wound, 2+ right femoral pulse  Chest: Clear to auscultation bilaterally  Cardiac: Regular rate and rhythm.  Assessment:  #1slowly improving left leg swelling with improvement of claudication symptoms and patent bypass graft left leg     #2 claudication right leg   Plan: Right femoropopliteal bypass scheduled for 05/11/2015. Risks benefits possible complications and procedure details were discussed the patient today. I also discussed with him cleaning up his left leg incisions well-healed  under anesthesia if there were still some areas of suture or eschar that needed to be removed.  He is on aspirin and Plavix. He will stop his Plavix today.  Fabienne Brunsharles Fields, MD [facility] Office: [phone] Pager: [phone]

## 2015-05-11 NOTE — Interval H&P Note (Signed)
History and Physical Interval Note:  05/11/2015 7:24 AM  Derrick [name]  has presented today for surgery, with the diagnosis of peripheral vascular disease with claudication I70.219  The various methods of treatment have been discussed with the patient and family. After consideration of risks, benefits and other options for treatment, the patient has consented to  Procedure(s): BYPASS GRAFT FEMORAL-POPLITEAL ARTERY (Right) as a surgical intervention .  The patient's history has been reviewed, patient examined, no change in status, stable for surgery.  I have reviewed the patient's chart and labs.  Questions were answered to the patient's satisfaction.     Fabienne BrunsFields, Darcell Yacoub

## 2015-05-11 NOTE — Anesthesia Procedure Notes (Signed)
Procedure Name: Intubation Date/Time: 05/11/2015 7:38 AM Performed by: Glo HerringLEE, Lianna Sitzmann B Pre-anesthesia Checklist: Patient identified, Patient being monitored, Emergency Drugs available, Suction available and Timeout performed Patient Re-evaluated:Patient Re-evaluated prior to inductionOxygen Delivery Method: Circle system utilized Preoxygenation: Pre-oxygenation with 100% oxygen Intubation Type: IV induction Ventilation: Mask ventilation without difficulty Grade View: Grade I Tube type: Oral Tube size: 7.5 mm Number of attempts: 1 Airway Equipment and Method: Stylet and Video-laryngoscopy Placement Confirmation: positive ETCO2,  CO2 detector,  ETT inserted through vocal cords under direct vision and breath sounds checked- equal and bilateral Secured at: 23 cm Tube secured with: Tape Dental Injury: Teeth and Oropharynx as per pre-operative assessment

## 2015-05-11 NOTE — Progress Notes (Signed)
Utilization review completed. Woodrow Drab, RN, BSN. 

## 2015-05-12 ENCOUNTER — Ambulatory Visit (HOSPITAL_COMMUNITY): Payer: BLUE CROSS/BLUE SHIELD

## 2015-05-12 ENCOUNTER — Encounter (HOSPITAL_COMMUNITY): Payer: Self-pay | Admitting: Vascular Surgery

## 2015-05-12 LAB — CBC
HCT: 24.3 % — ABNORMAL LOW (ref 39.0–52.0)
Hemoglobin: 8.2 g/dL — ABNORMAL LOW (ref 13.0–17.0)
MCH: 29.3 pg (ref 26.0–34.0)
MCHC: 33.7 g/dL (ref 30.0–36.0)
MCV: 86.8 fL (ref 78.0–100.0)
Platelets: 192 10*3/uL (ref 150–400)
RBC: 2.8 MIL/uL — ABNORMAL LOW (ref 4.22–5.81)
RDW: 14.3 % (ref 11.5–15.5)
WBC: 9.4 10*3/uL (ref 4.0–10.5)

## 2015-05-12 LAB — POCT I-STAT 4, (NA,K, GLUC, HGB,HCT)
Glucose, Bld: 119 mg/dL — ABNORMAL HIGH (ref 65–99)
Glucose, Bld: 149 mg/dL — ABNORMAL HIGH (ref 65–99)
HCT: 26 % — ABNORMAL LOW (ref 39.0–52.0)
HCT: 20 % — ABNORMAL LOW (ref 39.0–52.0)
Hemoglobin: 8.8 g/dL — ABNORMAL LOW (ref 13.0–17.0)
Hemoglobin: 6.8 g/dL — CL (ref 13.0–17.0)
Potassium: 4.3 mmol/L (ref 3.5–5.1)
Potassium: 4.2 mmol/L (ref 3.5–5.1)
Sodium: 137 mmol/L (ref 135–145)
Sodium: 137 mmol/L (ref 135–145)

## 2015-05-12 LAB — BASIC METABOLIC PANEL
Anion gap: 8 (ref 5–15)
BUN: 14 mg/dL (ref 6–20)
CO2: 27 mmol/L (ref 22–32)
Calcium: 7.6 mg/dL — ABNORMAL LOW (ref 8.9–10.3)
Chloride: 102 mmol/L (ref 101–111)
Creatinine, Ser: 1.3 mg/dL — ABNORMAL HIGH (ref 0.61–1.24)
GFR calc Af Amer: >60 mL/min (ref >60)
GFR calc non Af Amer: 58 mL/min — ABNORMAL LOW (ref >60)
Glucose, Bld: 114 mg/dL — ABNORMAL HIGH (ref 65–99)
Potassium: 3.8 mmol/L (ref 3.5–5.1)
Sodium: 137 mmol/L (ref 135–145)

## 2015-05-12 LAB — TYPE AND SCREEN
ABO/RH(D): O POS
Antibody Screen: NEGATIVE
Unit division: 0
Unit division: 0

## 2015-05-12 NOTE — Evaluation (Signed)
Physical Therapy Evaluation Patient Details Name: Derrick [name] MRN: [id number] DOB: 1955/06/22 Today's Date: 05/12/2015   History of Present Illness  Pt is a 59 y/o M w/ recent Lt fem pop bypass now s/p Rt fem pop bypass.  Pt's PMH includes metatarsalgia, cervical fusion, Rt elbow arthroplasty.  Clinical Impression  Patient is s/p above surgery resulting in functional limitations due to the deficits listed below (see PT Problem List). Mr. Hattabaugh tolerated therapeutic exercises and ambulated in hallway using RW at supervision level.  He will have assist from his wife at d/c.  Pt encouraged to ambulate in hall w/ nursing staff. Patient will benefit from skilled PT to increase their independence and safety with mobility to allow discharge to the venue listed below.      Follow Up Recommendations No PT follow up;Supervision for mobility/OOB    Equipment Recommendations  None recommended by PT    Recommendations for Other Services       Precautions / Restrictions Precautions Precautions: Fall Restrictions Weight Bearing Restrictions: No      Mobility  Bed Mobility Overal bed mobility: Independent                Transfers Overall transfer level: Needs assistance Equipment used: Rolling walker (2 wheeled) Transfers: Sit to/from Stand Sit to Stand: Supervision         General transfer comment: Cues to reach back w/ both UEs when sitting as pt holds onto RW w/ Lt UE.  Otherwise, pt w/ safe technique.  Ambulation/Gait Ambulation/Gait assistance: Supervision Ambulation Distance (Feet): 200 Feet Assistive device: Rolling walker (2 wheeled) Gait Pattern/deviations: Step-through pattern;Decreased weight shift to right;Decreased stance time - right;Decreased stride length;Trunk flexed   Gait velocity interpretation: Below normal speed for age/gender General Gait Details: Initially trunk flexed and pushing RW out in front but pt able to correct w/ verbal cues.  Enocuraged  to gradually dec WB through BUEs to pt's tolerance.  Stairs            Wheelchair Mobility    Modified Rankin (Stroke Patients Only)       Balance Overall balance assessment: Needs assistance Sitting-balance support: Feet supported Sitting balance-Leahy Scale: Good     Standing balance support: Bilateral upper extremity supported;During functional activity Standing balance-Leahy Scale: Fair                               Pertinent Vitals/Pain Pain Assessment: No/denies pain Pain Score: 7  Pain Location: Rt LE w/ sit<>stand Pain Descriptors / Indicators: Tightness ("pulling") Pain Intervention(s): Limited activity within patient's tolerance;Monitored during session    Home Living Family/patient expects to be discharged to:: Private residence Living Arrangements: Spouse/significant other Available Help at Discharge: Family;Available 24 hours/day (wife to stay w/ pt 24/7 for at least one day at d/c) Type of Home: House Home Access: Stairs to enter Entrance Stairs-Rails: Can reach both Entrance Stairs-Number of Steps: 3 Home Layout: One level Home Equipment: Walker - 2 wheels;Bedside commode;Crutches;Shower seat - built in      Prior Function Level of Independence: Independent         Comments: Pt used RW for ~4 wks after Lt fem pop bypass but since has not used AD.     Hand Dominance   Dominant Hand: Right    Extremity/Trunk Assessment   Upper Extremity Assessment: Defer to OT evaluation           Lower Extremity Assessment:  LLE deficits/detail;RLE deficits/detail RLE Deficits / Details: post op w/ limited strength as expected LLE Deficits / Details: scar from Lt fem pop bypass  Cervical / Trunk Assessment: Normal  Communication   Communication: No difficulties  Cognition Arousal/Alertness: Awake/alert Behavior During Therapy: WFL for tasks assessed/performed Overall Cognitive Status: Within Functional Limits for tasks assessed                       General Comments      Exercises Total Joint Exercises Knee Flexion: AROM;Both;10 reps;Seated General Exercises - Lower Extremity Ankle Circles/Pumps: AROM;Both;10 reps;Seated Long Arc Quad: AROM;Both;10 reps;Seated Hip Flexion/Marching: AROM;Both;10 reps;Seated      Assessment/Plan    PT Assessment Patient needs continued PT services  PT Diagnosis Difficulty walking;Acute pain   PT Problem List Decreased strength;Decreased activity tolerance;Decreased balance;Decreased mobility;Decreased knowledge of use of DME;Decreased knowledge of precautions;Decreased safety awareness;Pain  PT Treatment Interventions DME instruction;Gait training;Stair training;Functional mobility training;Therapeutic activities;Therapeutic exercise;Balance training;Neuromuscular re-education;Patient/family education;Modalities   PT Goals (Current goals can be found in the Care Plan section) Acute Rehab PT Goals Patient Stated Goal: to go home once ready PT Goal Formulation: With patient Time For Goal Achievement: 05/19/15 Potential to Achieve Goals: Good    Frequency Min 3X/week   Barriers to discharge Inaccessible home environment 3 steps to enter home    Co-evaluation               End of Session Equipment Utilized During Treatment: Gait belt Activity Tolerance: Patient tolerated treatment well Patient left: in bed;with call bell/phone within reach Nurse Communication: Mobility status         Time: 1216-1232 PT Time Calculation (min) (ACUTE ONLY): 16 min   Charges:   PT Evaluation $Initial PT Evaluation Tier I: 1 Procedure     PT G CodesMichail Jewels:       Ashley Parr PT, DPT [phone] Pager: [phone] 05/12/2015, 1:24 PM

## 2015-05-12 NOTE — Progress Notes (Signed)
Received patient to unit Pt placed on telemetry, assessed, VS stable, and oriented to floor. Pt has no pain and A&OX4. Teresa CoombsKatrina Elpidio Thielen 12:50 PM

## 2015-05-12 NOTE — Progress Notes (Addendum)
Pt to TX to 2W-35, VSS, called report. Called spouse to notify of transfer.

## 2015-05-12 NOTE — Progress Notes (Signed)
OT Cancellation Note  Patient Details Name: Derrick [name] MRN: [id number] DOB: 01/30/1956   Cancelled Treatment:    Reason Eval/Treat Not Completed: OT screened, no needs identified, will sign off. Spoke with pt and he had recent surgery on his other leg and has no OT concerns.   Earlie RavelingStraub, Xochitl Egle L OTR/L [phone] 05/12/2015, 3:31 PM

## 2015-05-12 NOTE — Progress Notes (Addendum)
  Progress Note    05/12/2015 7:22 AM 1 Day Post-Op  Subjective:  C/o pain in the back of his calf  Afebrile HR 60's-80's NSR 100's-130's systolic 97% RA  Filed Vitals:   05/11/15 2220 05/12/15 0355  BP: 121/69 122/63  Pulse: 70 69  Temp: 98.1 F (36.7 C) 98.2 F (36.8 C)  Resp: 16 16    Physical Exam: Cardiac:  regular Lungs:  Non labored Incisions:  All incisions look fine Extremities:  Right foot is warm with easily palpable right DP   CBC    Component Value Date/Time   WBC 9.4 05/12/2015 0426   RBC 2.80* 05/12/2015 0426   HGB 8.2* 05/12/2015 0426   HCT 24.3* 05/12/2015 0426   PLT 192 05/12/2015 0426   MCV 86.8 05/12/2015 0426   MCH 29.3 05/12/2015 0426   MCHC 33.7 05/12/2015 0426   RDW 14.3 05/12/2015 0426   LYMPHSABS 1.4 01/17/2015 1319   MONOABS 0.9 01/17/2015 1319   EOSABS 0.2 01/17/2015 1319   BASOSABS 0.1 01/17/2015 1319    BMET    Component Value Date/Time   NA 137 05/12/2015 0426   K 3.8 05/12/2015 0426   CL 102 05/12/2015 0426   CO2 27 05/12/2015 0426   GLUCOSE 114* 05/12/2015 0426   BUN 14 05/12/2015 0426   CREATININE 1.30* 05/12/2015 0426   CALCIUM 7.6* 05/12/2015 0426   GFRNONAA 58* 05/12/2015 0426   GFRAA >60 05/12/2015 0426    INR    Component Value Date/Time   INR 0.94 05/06/2015 1048     Intake/Output Summary (Last 24 hours) at 05/12/15 [id number] Last data filed at 05/12/15 0600  Gross per 24 hour  Intake   7960 ml  Output   6000 ml  Net   1960 ml     Assessment:  59 y.o. male is s/p:  Right femoral to below knee popliteal bypass with non-reversed ipsilateral great saphenous vein  1 Day Post-Op  Plan: -pt doing well this morning with patent bypass and palpable right DP -DVT prophylaxis:  Pt on aspirin-do not start Plavix/Lovenox yet due to high risk of bleeding -acute surgical blood loss anemia-received 2 PRBC's yesterday-hgb 8.2 today down from 12.5 yesterday.  Continue to monitor.  Check hgb tomorrow and if below  8-will transfuse.  Pt tolerating as of now. -pain in back of calf-calf is soft as is the thigh.  Not tender to palpation.  Motor and sensation are in tact.  Ambulated to restroom without difficulty this morning. -transfer to 2 west -increase mobilization -anticipate discharge in next 1-2 days. -dry gauze to right groin to wick moisture to help prevent wound infection.  Discussed with pt and his wife.    Doreatha MassedSamantha Ashia Dehner, PA-C [facility] [phone] 05/12/2015 7:22 AM

## 2015-05-13 ENCOUNTER — Inpatient Hospital Stay (HOSPITAL_COMMUNITY): Payer: BLUE CROSS/BLUE SHIELD

## 2015-05-13 DIAGNOSIS — I739 Peripheral vascular disease, unspecified: Secondary | ICD-10-CM

## 2015-05-13 LAB — CBC
HCT: 24 % — ABNORMAL LOW (ref 39.0–52.0)
Hemoglobin: 8 g/dL — ABNORMAL LOW (ref 13.0–17.0)
MCH: 29.5 pg (ref 26.0–34.0)
MCHC: 33.3 g/dL (ref 30.0–36.0)
MCV: 88.6 fL (ref 78.0–100.0)
Platelets: 176 10*3/uL (ref 150–400)
RBC: 2.71 MIL/uL — ABNORMAL LOW (ref 4.22–5.81)
RDW: 14.2 % (ref 11.5–15.5)
WBC: 8.5 10*3/uL (ref 4.0–10.5)

## 2015-05-13 MED ORDER — OXYCODONE HCL 5 MG PO TABS: 5.0000 mg | ORAL_TABLET | Freq: Four times a day (QID) | ORAL | Status: DC | PRN

## 2015-05-13 MED ORDER — CLOPIDOGREL BISULFATE 75 MG PO TABS
75.0000 mg | ORAL_TABLET | Freq: Every day | ORAL | Status: DC
  Administered 2015-05-13: 75 mg via ORAL
  Filled 2015-05-13: qty 1

## 2015-05-13 NOTE — Progress Notes (Signed)
Pt. Discharged to home with wife Pt. D/C'd via wheelchair with volunteers Discharge information reviewed and given All personal belongings given to Pt.  Education discussed with teach back IV's was d/c and intact upon removal  Tele d/c Mairim Bade 2:30 PM

## 2015-05-13 NOTE — Discharge Summary (Signed)
Discharge Summary     Derrick [name] 03/02/1956 59 y.o. male  [id number]  Admission Date: 05/11/2015  Discharge Date: 05/13/15  Physician: Sherren Kernsharles E Fields, MD  Admission Diagnosis: peripheral vascular disease with claudication I70.219   HPI:   This is a 59 y.o. male who underwent left femoral to posterior tibial bypass and a sequential graft to the below-knee popliteal artery on October 18. He presents today for postoperative follow-up. He still has some swelling in the left leg although this is improved. He has been walking better with improved claudication symptoms. Incisions are essentially healed at this point. He describes claudication symptoms in the right leg as well. Previous arteriogram showed multiple exophytic calcific obstructions of his right superficial femoral artery and haziness of the above-knee popliteal artery suggesting high-grade stenosis. He had a patent below-knee popliteal artery with good runoff. At this point he wishes to have the right leg bypass performed.  Hospital Course:  The patient was admitted to the hospital and taken to the operating room on 05/11/2015 and underwent: Right femoral to below knee popliteal bypass with non-reversed ipsilateral great saphenous vein.   He did have acute surgical blood loss anemia and was transfused t units of PRBC's in the operating room.  The pt tolerated the procedure well and was transported to the PACU in good condition.   He had easily palpable right pedal pulses in the recovery room.    By POD 1, he was doing well and continued to have easily palpable right PT/DP pulses.  He did complain of some posterior right calf pain.  His calf was soft and his motor and sensation were in tact.  By POD 2, he continued to have a patent bypass graft.  His calf pain was somewhat improved and remained soft and non tender and motor and sensation continued to be in tact.  His hgb is stable at 8.0. He is restarted on his  Plavix.  Post operative ABI's 05/13/15:  RIGHT    LEFT    PRESSURE WAVEFORM  PRESSURE WAVEFORM  BRACHIAL 148 Triphasic  BRACHIAL 153 Triphasic  DP   DP    AT 94 Monophasic AT 137 Triphasic   PT 103 Monophasic  PT 140 Triphasic   PER   PER    GREAT TOE  NA GREAT TOE  NA    RIGHT LEFT  ABI 0.67 0.92        The remainder of the hospital course consisted of increasing mobilization and increasing intake of solids without difficulty.  CBC    Component Value Date/Time   WBC 8.5 05/13/2015 0353   RBC 2.71* 05/13/2015 0353   HGB 8.0* 05/13/2015 0353   HCT 24.0* 05/13/2015 0353   PLT 176 05/13/2015 0353   MCV 88.6 05/13/2015 0353   MCH 29.5 05/13/2015 0353   MCHC 33.3 05/13/2015 0353   RDW 14.2 05/13/2015 0353   LYMPHSABS 1.4 01/17/2015 1319   MONOABS 0.9 01/17/2015 1319   EOSABS 0.2 01/17/2015 1319   BASOSABS 0.1 01/17/2015 1319    BMET    Component Value Date/Time   NA 137 05/12/2015 0426   K 3.8 05/12/2015 0426   CL 102 05/12/2015 0426   CO2 27 05/12/2015 0426   GLUCOSE 114* 05/12/2015 0426   BUN 14 05/12/2015 0426   CREATININE 1.30* 05/12/2015 0426   CALCIUM 7.6* 05/12/2015 0426   GFRNONAA 58* 05/12/2015 0426   GFRAA >60 05/12/2015 0426       Discharge Diagnosis:  peripheral vascular  disease with claudication I70.219  Secondary Diagnosis: Patient Active Problem List   Diagnosis Date Noted  . Drainage from wound-Left Groin,Thigh and lower leg 03/27/2015  . Left leg swelling 03/27/2015  . PAD (peripheral artery disease) (HCC) 03/17/2015  . Closed fracture of shaft of metacarpal bone(s) 04/23/2013  . Right rotator cuff tear 04/23/2013  . Coronary artery disease 10/29/2010  . Hyperlipidemia 10/29/2010   Past Medical History  Diagnosis Date  . Hyperparathyroidism   . Hypertension   . Dyslipidemia   . OA (osteoarthritis)   . Metatarsalgia   . Hyperlipidemia     statin intolerant  . GERD  (gastroesophageal reflux disease)   . Coronary artery disease 2006    stents x2=LAD LCX; totally occluded RCA  . Seasonal allergies   . Insomnia   . Peripheral vascular disease (HCC)   . Wears glasses   . History of bronchitis   . Urinary frequency        Medication List    ASK your doctor about these medications        aspirin 81 MG tablet  Take 1 tablet (81 mg total) by mouth daily.     calcitRIOL 0.5 MCG capsule  Commonly known as:  ROCALTROL  Take 0.5 mcg by mouth daily.     calcium-vitamin D 500-200 MG-UNIT tablet  Commonly known as:  OSCAL WITH D  Take 2 tablets by mouth. Take 2 tabs Am and 1 tab Pm     clopidogrel 75 MG tablet  Commonly known as:  PLAVIX  Take 1 tablet (75 mg total) by mouth daily.     ezetimibe 10 MG tablet  Commonly known as:  ZETIA  Take 10 mg by mouth daily.     fenofibrate 160 MG tablet  Take 160 mg by mouth daily.     fish oil-omega-3 fatty acids 1000 MG capsule  Take 4,800 mg by mouth daily. Take 2 tabs. Am and 2 tabs Pm     glucosamine-chondroitin 500-400 MG tablet  Take 1 tablet by mouth 2 (two) times daily.     loratadine 10 MG tablet  Commonly known as:  CLARITIN  Take 10 mg by mouth daily.     losartan 100 MG tablet  Commonly known as:  COZAAR  Take 100 mg by mouth daily.     multivitamin with minerals tablet  Take 1 tablet by mouth daily.     nitroGLYCERIN 0.4 MG SL tablet  Commonly known as:  NITROSTAT  Place 1 tablet (0.4 mg total) under the tongue every 5 (five) minutes as needed for chest pain.     OVER THE COUNTER MEDICATION  Take 120 mg by mouth every morning. Ginkgo Biloba     Potassium 99 MG Tabs  Take 1 tablet by mouth daily.     ranitidine 150 MG tablet  Commonly known as:  ZANTAC  Take 150 mg by mouth 2 (two) times daily.     traZODone 50 MG tablet  Commonly known as:  DESYREL  Take 50 mg by mouth at bedtime.     TYLENOL ARTHRITIS PAIN 650 MG CR tablet  Generic drug:  acetaminophen  Take 1,300  mg by mouth 2 (two) times daily.        Prescriptions given: 1.  Roxicodone #30 No Refill  Instructions: 1.  Wash the groin wound with soap and water daily and pat dry. (No tub bath-only shower)  Then put a dry gauze or washcloth there to keep this area dry  daily and as needed.  Do not use Vaseline or neosporin on your incisions.  Only use soap and water on your incisions and then protect and keep dry.  Disposition: home  Patient's condition: is Good  Follow up: 1. Dr. Darrick Penna in 2 weeks   Doreatha Massed, PA-C [facility] [phone] 05/13/2015  7:53 AM  - For VQI Registry use --- Instructions: Press F2 to tab through selections.  Delete question if not applicable.   Post-op:  Wound infection: No  Graft infection: No  Transfusion: Yes  If yes, 2 units given New Arrhythmia: No Ipsilateral amputation: No,  Minor,  BKA,  AKA Discharge patency: [x ] Primary,  Primary assisted,  Secondary,  Occluded Patency judged by:  Dopper only,  Palpable graft pulse,  Palpable distal pulse,  ABI inc. > 0.15,  Duplex Discharge ABI: R 0.67 , L 0.92 D/C Ambulatory Status: Ambulatory  Complications: MI: No,  Troponin only,  EKG or Clinical CHF: No Resp failure:No,  Pneumonia,  Ventilator Chg in renal function: No,  Inc. Cr > 0.5,  Temp. Dialysis,  Permanent dialysis Stroke: No,  Minor,  Major Return to OR: No  Reason for return to OR:  Bleeding,  Infection,  Thrombosis,   Revision  Discharge medications: Statin use:  No-allergy ASA use:  yes Plavix use:  yes Beta blocker use: no ACEI use:   no ARB use:  yes Coumadin use: no

## 2015-05-13 NOTE — Progress Notes (Signed)
VASCULAR LAB PRELIMINARY  ARTERIAL  ABI completed:    RIGHT    LEFT    PRESSURE WAVEFORM  PRESSURE WAVEFORM  BRACHIAL 148 Triphasic  BRACHIAL 153 Triphasic  DP   DP    AT 94 Monophasic AT 137 Triphasic   PT 103 Monophasic  PT 140 Triphasic   PER   PER    GREAT TOE  NA GREAT TOE  NA    RIGHT LEFT  ABI 0.67 0.92     Eara Burruel, RVT 05/13/2015, 8:58 AM

## 2015-05-13 NOTE — Care Management Note (Signed)
Case Management Note Previous CM note initiated by Gae GallopAngela [name] RN CM  Patient Details  Name: Derrick [name] MRN: [id number] Date of Birth: 07/12/1955  Subjective/Objective:                 Admitted s/p Right femoral to below knee popliteal bypass with non-reversed ipsilateral great saphenous vein 05/11/15. Independent with ADL's. WUJ:[id number]:rolling walker.  Action/Plan: Return to home when medically stable. CM to f/u with d/c disposition.  Expected Discharge Date:     05/13/15             Expected Discharge Plan:  Home/Self Care  In-House Referral:     Discharge planning Services  CM Consult  Post Acute Care Choice:  NA Choice offered to:  NA  DME Arranged:  N/A DME Agency:  NA  HH Arranged:  NA HH Agency:  NA  Status of Service:  Completed, signed off  Medicare Important Message Given:    Date Medicare IM Given:    Medicare IM give by:    Date Additional Medicare IM Given:    Additional Medicare Important Message give by:     If discussed at Long Length of Stay Meetings, dates discussed:    Discharge Disposition: Home/ Self Care   Additional Comments: CM @ bedside,  spoke with pt/wife regarding d/c plan. Wife states plan is for husband  to be d/c to home and assume care for self with assist from her if needed.  [name] (Spouse)  [phone]  Donn PieriniWebster, Jolin Benavides [place]Hall, [place]RN,BSN,CM [phone] 05/13/2015, 2:10 PM

## 2015-05-13 NOTE — Progress Notes (Addendum)
  Progress Note    05/13/2015 7:44 AM 2 Days Post-Op  Subjective:  "Less pain in my calf" Walked yesterday without difficulty  Afebrile HR 80's NSR 120's-130's systolic 95% RA  Filed Vitals:   05/12/15 2018 05/13/15 0424  BP: 129/62 135/67  Pulse: 84 84  Temp: 98.1 F (36.7 C) 98.4 F (36.9 C)  Resp: 18 18    Physical Exam: Cardiac:  regular Lungs:  Non labored Incisions:  Healing nicely Extremities:  2+ right DP palpable pulse 1+ right PT palpable pulse   CBC    Component Value Date/Time   WBC 8.5 05/13/2015 0353   RBC 2.71* 05/13/2015 0353   HGB 8.0* 05/13/2015 0353   HCT 24.0* 05/13/2015 0353   PLT 176 05/13/2015 0353   MCV 88.6 05/13/2015 0353   MCH 29.5 05/13/2015 0353   MCHC 33.3 05/13/2015 0353   RDW 14.2 05/13/2015 0353   LYMPHSABS 1.4 01/17/2015 1319   MONOABS 0.9 01/17/2015 1319   EOSABS 0.2 01/17/2015 1319   BASOSABS 0.1 01/17/2015 1319    BMET    Component Value Date/Time   NA 137 05/12/2015 0426   K 3.8 05/12/2015 0426   CL 102 05/12/2015 0426   CO2 27 05/12/2015 0426   GLUCOSE 114* 05/12/2015 0426   BUN 14 05/12/2015 0426   CREATININE 1.30* 05/12/2015 0426   CALCIUM 7.6* 05/12/2015 0426   GFRNONAA 58* 05/12/2015 0426   GFRAA >60 05/12/2015 0426    INR    Component Value Date/Time   INR 0.94 05/06/2015 1048     Intake/Output Summary (Last 24 hours) at 05/13/15 0744 Last data filed at 05/12/15 2159  Gross per 24 hour  Intake    480 ml  Output   2450 ml  Net  -1970 ml     Assessment:  59 y.o. male is s/p:  Right femoral to below knee popliteal bypass with non-reversed ipsilateral great saphenous vein   2 Days Post-Op  Plan: -pt doing well this morning with patent bypass with easily palpable right DP pulse -acute surgical blood loss anemia stable-hgb down slightly to 8.0 from 8.2 and he is tolerating.  May benefit from starting po iron -will d/w Dr. Darrick PennaFields when we can restart Plavix -pt ambulating well -less pain in  right calf-continues to be soft -discussed groin wound care and he expresses understanding -f/u with Dr. Darrick PennaFields in 2 weeks.   Doreatha MassedSamantha Rhyne, PA-C [facility] [phone] 05/13/2015 7:44 AM  Agree with above 2+ DP pulse Right leg incisions healing D/c home today  Fabienne Brunsharles Fields, MD [facility] [place] Office: [phone] Pager: [phone]

## 2015-05-28 ENCOUNTER — Encounter: Payer: Self-pay | Admitting: Vascular Surgery

## 2015-06-04 ENCOUNTER — Encounter: Payer: Self-pay | Admitting: Vascular Surgery

## 2015-06-04 ENCOUNTER — Ambulatory Visit (INDEPENDENT_AMBULATORY_CARE_PROVIDER_SITE_OTHER): Payer: BLUE CROSS/BLUE SHIELD | Admitting: Vascular Surgery

## 2015-06-04 ENCOUNTER — Encounter: Payer: Self-pay | Admitting: *Deleted

## 2015-06-04 VITALS — BP 138/69 | HR 72 | Temp 97.7°F | Resp 16 | Ht 74.0 in | Wt 218.0 lb

## 2015-06-04 DIAGNOSIS — I739 Peripheral vascular disease, unspecified: Secondary | ICD-10-CM

## 2015-06-04 NOTE — Progress Notes (Signed)
Patient is a 60 year old male who underwent left femoral to posterior tibial bypass and a sequential graft to the below-knee popliteal artery on October 18. He subsequently underwent right femoral to below-knee popliteal bypass using saphenous vein on December 12. He presents today for postoperative follow-up.  He still has some swelling in the left leg although this is improved. He has been walking better with improved claudication symptoms. Incisions are essentially healed at this point.  He did have a small area of drainage at the apex of the right groin incision. There was no surrounding erythema.      Past Medical History   Diagnosis  Date   .  Hyperparathyroidism     .  Hypertension     .  Dyslipidemia     .  OA (osteoarthritis)     .  Metatarsalgia     .  Hyperlipidemia         statin intolerant   .  GERD (gastroesophageal reflux disease)     .  Coronary artery disease  2006       stents x2=LAD LCX; totally occluded RCA   .  Seasonal allergies     .  Insomnia     .  Peripheral vascular disease [facility](HCC)         Past Surgical History   Procedure  Laterality  Date   .  Cervical spine surgery    2004   .  Cardiac catheterization    10/28/2004       circ and mid LAD chyper stents   .  Cervical fusion    [id number]   .  Incision and drainage abscess posterior cervicalspine    2009   .  Elbow arthroplasty    1994       right   .  Colonoscopy       .  Shoulder open rotator cuff repair  Right  04/23/2013       Procedure: RIGHT TWO TENDON ROTATOR CUFF REPAIR  SUBACROMIAL DECOMPRESSION AND OPEN DISTAL CLAVICLE RESECTION;  Surgeon: Wyn Forsterobert V Sypher Jr., MD;  Location: [facility];  Service: Orthopedics;  Laterality: Right;   .  Peripheral vascular catheterization  N/A  01/16/2015       Procedure: Abdominal Aortogram;  Surgeon: Sherren Kernsharles E Fields, MD;  [facility];  Service: Cardiovascular;  Laterality: N/A;   .  Stents placed     2006   .  Coronary angioplasty     2006       stents placed    .  Femoral-popliteal bypass graft  Left  03/17/2015       Procedure: BYPASS GRAFT FEMORAL-POPLITEAL ARTERY-LEFT LEG AND POSTERIOR TIBIAL SEQUENTIAL GRAFT TO BELOW KNEE POPLITEAL ARTERY;  Surgeon: Sherren Kernsharles E Fields, MD;  [facility];  Service: Vascular;  Laterality: Left;   .  Endarterectomy femoral  Left  03/17/2015       Procedure: ENDARTERECTOMY COMMON FEMORAL WITH PROFUNDOPLASTY;  Surgeon: Sherren Kernsharles E Fields, MD;  [facility];  Service: Vascular;  Laterality: Left;   .  Patch angioplasty  Left  03/17/2015       Procedure: VEIN PATCH ANGIOPLASTY COMMON FEMORAL ARTERY;  Surgeon: Sherren Kernsharles E Fields, MD;  [facility];  Service: Vascular;  Laterality: Left;   .  Intraoperative arteriogram  Left  03/17/2015       Procedure: INTRA OPERATIVE ARTERIOGRAM X2;  Surgeon: Sherren Kernsharles E Fields, MD;  [facility];  Service: Vascular;  Laterality: Left;       Current Outpatient Prescriptions on File Prior to Visit   Medication  Sig  Dispense  Refill   .  acetaminophen (TYLENOL ARTHRITIS PAIN) 650 MG CR tablet  Take 1,300 mg by mouth 2 (two) times daily.        Marland Kitchen  aspirin 81 MG tablet  Take 1 tablet (81 mg total) by mouth daily.  30 tablet  6   .  calcitRIOL (ROCALTROL) 0.5 MCG capsule  Take 0.5 mcg by mouth daily.       .  calcium-vitamin D (OSCAL WITH D) 500-200 MG-UNIT per tablet  Take 2 tablets by mouth. Take 2 tabs Am and 1 tab Pm       .  clopidogrel (PLAVIX) 75 MG tablet  Take 1 tablet (75 mg total) by mouth daily.  90 tablet  3   .  ezetimibe (ZETIA) 10 MG tablet  Take 10 mg by mouth daily.         .  fenofibrate 160 MG tablet  Take 160 mg by mouth daily.       .  fish oil-omega-3 fatty acids 1000 MG capsule  Take 4,800 mg by mouth daily. Take 2 tabs. Am and 2 tabs Pm       .  glucosamine-chondroitin 500-400 MG tablet  Take 1 tablet by mouth 2 (two) times daily.        Marland Kitchen  loratadine (CLARITIN) 10 MG tablet  Take 10 mg by mouth daily.       Marland Kitchen  losartan (COZAAR) 100  MG tablet  Take 100 mg by mouth daily.        .  Multiple Vitamins-Minerals (MULTIVITAMIN WITH MINERALS) tablet  Take 1 tablet by mouth daily.       .  mupirocin ointment (BACTROBAN) 2 %  Place 1 application into the nose 2 (two) times daily. Needs 2 additional doses for 10 doses       .  OVER THE COUNTER MEDICATION  Take 120 mg by mouth every morning. Ginkgo Biloba       .  oxyCODONE-acetaminophen (PERCOCET/ROXICET) 5-325 MG tablet  Take 1 tablet by mouth every 6 (six) hours as needed for moderate pain.  30 tablet  0   .  Potassium 99 MG TABS  Take 1 tablet by mouth daily.       .  ranitidine (ZANTAC) 150 MG tablet  Take 150 mg by mouth 2 (two) times daily.       .  traZODone (DESYREL) 50 MG tablet  Take 50 mg by mouth at bedtime.       .  nitroGLYCERIN (NITROSTAT) 0.4 MG SL tablet  Place 1 tablet (0.4 mg total) under the tongue every 5 (five) minutes as needed for chest pain.  25 tablet  12      No current facility-administered medications on file prior to visit.      Physical exam:    Filed Vitals:   06/04/15 1414  BP: 138/69  Pulse: 72  Temp: 97.7 F (36.5 C)  Resp: 16  Height: 6\' 2"  (1.88 m)  Weight: 218 lb (98.884 kg)  SpO2: 100%     Left lower extremity: 2+ dorsalis pedis pulse healing incisions  Right lower extremity:  2+ right dorsalis pedis pulse small area of drainage apex of right groin incision no surrounding erythema   Assessment:  #1slowly improving left leg swelling with improvement of claudication symptoms and patent bypass graft left  leg.   Healing incisions right leg with relief of claudication symptoms right side  Plan:  Doing well status post bilateral femoral-popliteal bypasses. Claudication symptoms relieved at this point. Patient will return to work January 23 with no restrictions. He will follow-up with me in 3 months time for bilateral ABIs and graft duplex scans.  Fabienne Bruns, MD [facility] [place] Office:  [phone] Pager: [phone]

## 2015-06-05 NOTE — Addendum Note (Signed)
Addended by: Adria DillELDRIDGE-LEWIS, Rossetta Kama L on: 06/05/2015 09:35 AM   Modules accepted: Orders

## 2015-08-28 ENCOUNTER — Encounter: Payer: Self-pay | Admitting: Vascular Surgery

## 2015-09-03 ENCOUNTER — Ambulatory Visit (INDEPENDENT_AMBULATORY_CARE_PROVIDER_SITE_OTHER)
Admission: RE | Admit: 2015-09-03 | Discharge: 2015-09-03 | Disposition: A | Discharge status: Home / Self Care | Payer: BLUE CROSS/BLUE SHIELD | Source: Ambulatory Visit | Attending: Vascular Surgery | Admitting: Vascular Surgery

## 2015-09-03 ENCOUNTER — Encounter: Payer: Self-pay | Admitting: Vascular Surgery

## 2015-09-03 ENCOUNTER — Ambulatory Visit (INDEPENDENT_AMBULATORY_CARE_PROVIDER_SITE_OTHER): Payer: BLUE CROSS/BLUE SHIELD | Admitting: Vascular Surgery

## 2015-09-03 ENCOUNTER — Ambulatory Visit (HOSPITAL_COMMUNITY)
Admission: RE | Admit: 2015-09-03 | Discharge: 2015-09-03 | Disposition: A | Discharge status: Home / Self Care | Payer: BLUE CROSS/BLUE SHIELD | Source: Ambulatory Visit | Attending: Vascular Surgery | Admitting: Vascular Surgery

## 2015-09-03 ENCOUNTER — Ambulatory Visit
Admission: RE | Admit: 2015-09-03 | Discharge: 2015-09-03 | Disposition: A | Discharge status: Home / Self Care | Payer: BLUE CROSS/BLUE SHIELD | Source: Ambulatory Visit | Attending: Vascular Surgery | Admitting: Vascular Surgery

## 2015-09-03 VITALS — BP 147/88 | HR 73 | Ht 74.0 in | Wt 210.0 lb

## 2015-09-03 DIAGNOSIS — I739 Peripheral vascular disease, unspecified: Secondary | ICD-10-CM

## 2015-09-03 DIAGNOSIS — E785 Hyperlipidemia, unspecified: Secondary | ICD-10-CM | POA: Diagnosis not present

## 2015-09-03 DIAGNOSIS — M79641 Pain in right hand: Secondary | ICD-10-CM

## 2015-09-03 DIAGNOSIS — K219 Gastro-esophageal reflux disease without esophagitis: Secondary | ICD-10-CM | POA: Insufficient documentation

## 2015-09-03 DIAGNOSIS — R0989 Other specified symptoms and signs involving the circulatory and respiratory systems: Secondary | ICD-10-CM | POA: Diagnosis present

## 2015-09-03 DIAGNOSIS — I10 Essential (primary) hypertension: Secondary | ICD-10-CM | POA: Diagnosis not present

## 2015-09-03 NOTE — Progress Notes (Signed)
Patient is a 60 year old male who underwent left femoral to posterior tibial bypass and a sequential graft to the below-knee popliteal artery on October 18. He subsequently underwent right femoral to below-knee popliteal bypass using saphenous vein on December 12. He presents today for follow-up.  He still has some swelling in the left leg although this is improved. He has been walking better with improved claudication symptoms. He also complains of some numbness and tingling on the inner aspect of his left calf along the saphenous nerve course. He does still complain of some pain on the dorsum of his right foot over a bony prominence and is requesting a visit to the podiatrist for this for a shoe insert.  He also complains of pain in his right hand after a fall recently. He complains of pain in the right third finger. He does have some swelling and is unable to completely flex the right finger.    Past Medical History    Diagnosis   Date    .   Hyperparathyroidism       .   Hypertension       .   Dyslipidemia       .   OA (osteoarthritis)       .   Metatarsalgia       .   Hyperlipidemia             statin intolerant    .   GERD (gastroesophageal reflux disease)       .   Coronary artery disease   2006          stents x2=LAD LCX; totally occluded RCA    .   Seasonal allergies       .   Insomnia       .   Peripheral vascular disease [facility](HCC)           Past Surgical History    Procedure   Laterality   Date    .   Cervical spine surgery      2004    .   Cardiac catheterization      10/28/2004          circ and mid LAD chyper stents    .   Cervical fusion      [id number],2009    .   Incision and drainage abscess posterior cervicalspine      2009    .   Elbow arthroplasty      1994          right    .   Colonoscopy          .   Shoulder open rotator cuff repair   Right   04/23/2013          Procedure: RIGHT TWO TENDON ROTATOR CUFF REPAIR  SUBACROMIAL DECOMPRESSION AND OPEN DISTAL CLAVICLE RESECTION;   Surgeon: Wyn Forsterobert V Sypher Jr., MD;  Location: [facility];  Service: Orthopedics;  Laterality: Right;    .   Peripheral vascular catheterization   N/A   01/16/2015          Procedure: Abdominal Aortogram;  Surgeon: Sherren Kernsharles E Micah Barnier, MD;  [facility];  Service: Cardiovascular;  Laterality: N/A;    .   Stents placed       2006    .   Coronary angioplasty      2006          stents placed     .  Femoral-popliteal bypass graft   Left   03/17/2015          Procedure: BYPASS GRAFT FEMORAL-POPLITEAL ARTERY-LEFT LEG AND POSTERIOR TIBIAL SEQUENTIAL GRAFT TO BELOW KNEE POPLITEAL ARTERY;  Surgeon: Sherren Kerns, MD;  Location: [facility] OR;  Service: Vascular;  Laterality: Left;    .   Endarterectomy femoral   Left   03/17/2015          Procedure: ENDARTERECTOMY COMMON FEMORAL WITH PROFUNDOPLASTY;  Surgeon: Sherren Kerns, MD;  Location: [facility] OR;  Service: Vascular;  Laterality: Left;    .   Patch angioplasty   Left   03/17/2015          Procedure: VEIN PATCH ANGIOPLASTY COMMON FEMORAL ARTERY;  Surgeon: Sherren Kerns, MD;  Location: [facility] OR;  Service: Vascular;  Laterality: Left;    .   Intraoperative arteriogram   Left   03/17/2015          Procedure: INTRA OPERATIVE ARTERIOGRAM X2;  Surgeon: Sherren Kerns, MD;  Location: [facility] OR;  Service: Vascular;  Laterality: Left;        Current Outpatient Prescriptions on File Prior to Visit    Medication   Sig   Dispense   Refill    .   acetaminophen (TYLENOL ARTHRITIS PAIN) 650 MG CR tablet   Take 1,300 mg by mouth 2 (two) times daily.           Marland Kitchen   aspirin 81 MG tablet   Take 1 tablet (81 mg total) by mouth daily.   30 tablet   6    .   calcitRIOL (ROCALTROL) 0.5 MCG capsule   Take 0.5 mcg by mouth daily.          .   calcium-vitamin D (OSCAL WITH D) 500-200 MG-UNIT per tablet   Take 2 tablets by mouth. Take 2 tabs Am and 1 tab Pm          .   clopidogrel (PLAVIX) 75 MG tablet   Take 1 tablet (75 mg total) by mouth daily.   90 tablet    3    .   ezetimibe (ZETIA) 10 MG tablet   Take 10 mg by mouth daily.            .   fenofibrate 160 MG tablet   Take 160 mg by mouth daily.          .   fish oil-omega-3 fatty acids 1000 MG capsule   Take 4,800 mg by mouth daily. Take 2 tabs. Am and 2 tabs Pm          .   glucosamine-chondroitin 500-400 MG tablet   Take 1 tablet by mouth 2 (two) times daily.           Marland Kitchen   loratadine (CLARITIN) 10 MG tablet   Take 10 mg by mouth daily.          Marland Kitchen   losartan (COZAAR) 100 MG tablet   Take 100 mg by mouth daily.           .   Multiple Vitamins-Minerals (MULTIVITAMIN WITH MINERALS) tablet   Take 1 tablet by mouth daily.          .   mupirocin ointment (BACTROBAN) 2 %   Place 1 application into the nose 2 (two) times daily. Needs 2 additional doses for 10 doses          .   OVER THE COUNTER  MEDICATION   Take 120 mg by mouth every morning. Ginkgo Biloba          .   oxyCODONE-acetaminophen (PERCOCET/ROXICET) 5-325 MG tablet   Take 1 tablet by mouth every 6 (six) hours as needed for moderate pain.   30 tablet   0    .   Potassium 99 MG TABS   Take 1 tablet by mouth daily.          .   ranitidine (ZANTAC) 150 MG tablet   Take 150 mg by mouth 2 (two) times daily.          .   traZODone (DESYREL) 50 MG tablet   Take 50 mg by mouth at bedtime.          .   nitroGLYCERIN (NITROSTAT) 0.4 MG SL tablet   Place 1 tablet (0.4 mg total) under the tongue every 5 (five) minutes as needed for chest pain.   25 tablet   12       No current facility-administered medications on file prior to visit.       Physical exam:    Filed Vitals:   09/03/15 1435 09/03/15 1437  BP: 146/83 147/88  Pulse: 73   Height:  (1.88 m)   Weight: 210 lb (95.255 kg)   SpO2: 99%     Left lower extremity: 2+ dorsalis pedis pulse  Right lower extremity:  2+ right dorsalis pedis pulse  Neck: No carotid bruits  Chest: Clear to auscultation bilaterally. Cardiac: Regular rate and rhythm without murmur  Right hand some ecchymosis  and swelling at the PIP MIP joint with some tenderness  Right foot bony prominence dorsum of foot no ulcer no real tenderness to palpation  Data: Patient had bilateral graft duplex scans today which showed widely patent bypass grafts with no anastomotic narrowing ABIs were 1 bilaterally  Assessment:  #1 bilateral patent bypasses #2 left leg swelling compression stocking prescription given today #3 right hand pain we will obtain a right hand x-ray to rule out fracture #4 pain and right foot from bony prominence referral to podiatry  Plan:  Doing well status post bilateral femoral-popliteal bypasses. Claudication symptoms relieved at this point. Plan as above patient will follow-up with me in 6 months time with repeat ABIs and repeat graft duplex.  Fabienne Bruns, MD [facility] [place] Office: [phone] Pager: [phone]

## 2015-09-04 NOTE — Progress Notes (Signed)
Follow up on patient's right hand xray for right 3rd finger pain;  CLINICAL DATA: Larey SeatFell 5 days ago and injured right hand. Persistent pain vertically involving the middle finger.  EXAM: RIGHT HAND - COMPLETE 3+ VIEW  COMPARISON: Radiographs 09/14/2009.  FINDINGS: Overall stable changes of erosive osteoarthritis involving the DIP and PIP joints of the fingers and the carpometacarpal joint of the thumb. The metacarpophalangeal joints are maintained in the wrist joints are maintained. No acute fractures identified.  IMPRESSION: Advanced degenerative changes but no acute bony findings.   Electronically Signed  By: Rudie MeyerP. Gallerani M.D.  On: 09/03/2015 20:57    I called patient and gave him this information. If he is still having problems on Monday, he will call his orthopedist. Patient understands and is in agreement to this plan.

## 2015-09-10 ENCOUNTER — Ambulatory Visit (INDEPENDENT_AMBULATORY_CARE_PROVIDER_SITE_OTHER): Payer: BLUE CROSS/BLUE SHIELD

## 2015-09-10 ENCOUNTER — Encounter: Payer: Self-pay | Admitting: Podiatry

## 2015-09-10 ENCOUNTER — Ambulatory Visit (INDEPENDENT_AMBULATORY_CARE_PROVIDER_SITE_OTHER): Payer: BLUE CROSS/BLUE SHIELD | Admitting: Podiatry

## 2015-09-10 VITALS — BP 141/78 | HR 69 | Resp 12

## 2015-09-10 DIAGNOSIS — M779 Enthesopathy, unspecified: Secondary | ICD-10-CM

## 2015-09-10 DIAGNOSIS — M79671 Pain in right foot: Secondary | ICD-10-CM | POA: Diagnosis not present

## 2015-09-10 MED ORDER — NONFORMULARY OR COMPOUNDED ITEM: 1.0000 g | Freq: Every day | Status: DC

## 2015-09-10 MED ORDER — TRIAMCINOLONE ACETONIDE 10 MG/ML IJ SUSP
10.0000 mg | Freq: Once | INTRAMUSCULAR | Status: AC
  Administered 2015-09-10: 10 mg

## 2015-09-10 NOTE — Progress Notes (Signed)
   Subjective:    Patient ID: Derrick [name], male    DOB: 02/26/1956, 60 y.o.   MRN: [id number]  HPI  PT STATED TOP OF THE FOOT ARE SORE/NUMBNESS FOR 1 YEAR. FEET ARE GETTING WORSE LONG TERM SITTING AND GET UP. TREATMENT OTC INSERTS.  Review of Systems  HENT: Positive for sinus pressure.   Cardiovascular: Positive for leg swelling.  Musculoskeletal: Positive for joint swelling.       Objective:   Physical Exam        Assessment & Plan:

## 2015-09-13 NOTE — Progress Notes (Signed)
Subjective:     Patient ID: Christoper AllegraSteven R Clayborn, male   DOB: 04/06/1956, 60 y.o.   MRN: [id number]  HPI patient presents with chronic pain on top of both feet and long-term history of vascular disease with treatment by vascular surgeon including bypass-type surgery   Review of Systems  All other systems reviewed and are negative.      Objective:   Physical Exam  Constitutional: He is oriented to person, place, and time.  Cardiovascular: Intact distal pulses.   Musculoskeletal: Normal range of motion.  Neurological: He is oriented to person, place, and time.  Skin: Skin is warm and dry.  Nursing note and vitals reviewed.  neurovascular status found to be intact with muscle strength adequate range of motion within normal limits with patient noted to have chronic discomfort in the dorsum of both feet with inflammation of the tendon complex. Patient is noted to have moderate depression of the arch and range of motion loss in the midtarsal joint but I did not note any acute vascular pathology     Assessment:     Chronic tendinitis symptoms with probable arthritic changes    Plan:     H&P and x-rays reviewed and today careful dorsal injections administered 3 mg Kenalog 5 mg Xylocaine bilateral. Instructed on careful heat and ice therapy wider-type shoes and possible inserts in future but patient be seen back when we see response to medication  Indicates changes in the midtarsal joint bilateral with moderate depression of the arch

## 2015-09-30 NOTE — Addendum Note (Signed)
Addended by: Sharee PimpleMCCHESNEY, Brei Pociask K on: 09/30/2015 10:50 AM   Modules accepted: Orders

## 2016-03-10 ENCOUNTER — Other Ambulatory Visit: Payer: Self-pay | Admitting: Family Medicine

## 2016-03-10 DIAGNOSIS — R109 Unspecified abdominal pain: Secondary | ICD-10-CM

## 2016-03-15 ENCOUNTER — Ambulatory Visit
Admission: RE | Admit: 2016-03-15 | Discharge: 2016-03-15 | Disposition: A | Discharge status: Home / Self Care | Payer: BLUE CROSS/BLUE SHIELD | Source: Ambulatory Visit | Attending: Family Medicine | Admitting: Family Medicine

## 2016-03-15 DIAGNOSIS — R109 Unspecified abdominal pain: Secondary | ICD-10-CM

## 2016-04-05 ENCOUNTER — Telehealth: Payer: Self-pay | Admitting: *Deleted

## 2016-04-05 NOTE — Telephone Encounter (Signed)
Patient is scheduled for colon[facility] 06/01/2016 at [facility].

## 2016-04-06 ENCOUNTER — Encounter: Payer: Self-pay | Admitting: Vascular Surgery

## 2016-04-07 ENCOUNTER — Ambulatory Visit (INDEPENDENT_AMBULATORY_CARE_PROVIDER_SITE_OTHER): Payer: BLUE CROSS/BLUE SHIELD | Admitting: Vascular Surgery

## 2016-04-07 ENCOUNTER — Encounter: Payer: Self-pay | Admitting: Vascular Surgery

## 2016-04-07 ENCOUNTER — Ambulatory Visit (INDEPENDENT_AMBULATORY_CARE_PROVIDER_SITE_OTHER)
Admission: RE | Admit: 2016-04-07 | Discharge: 2016-04-07 | Disposition: A | Discharge status: Home / Self Care | Payer: BLUE CROSS/BLUE SHIELD | Source: Ambulatory Visit | Attending: Vascular Surgery | Admitting: Vascular Surgery

## 2016-04-07 ENCOUNTER — Ambulatory Visit (HOSPITAL_COMMUNITY)
Admission: RE | Admit: 2016-04-07 | Discharge: 2016-04-07 | Disposition: A | Discharge status: Home / Self Care | Payer: BLUE CROSS/BLUE SHIELD | Source: Ambulatory Visit | Attending: Vascular Surgery | Admitting: Vascular Surgery

## 2016-04-07 VITALS — BP 140/76 | HR 64 | Temp 98.4°F | Resp 20 | Ht 74.0 in | Wt 214.2 lb

## 2016-04-07 DIAGNOSIS — I739 Peripheral vascular disease, unspecified: Secondary | ICD-10-CM

## 2016-04-07 NOTE — Progress Notes (Signed)
Patient is a 60 year old male who underwent left femoral to posterior tibial bypass and a sequential graft to the below-knee popliteal artery on October 18. He subsequently underwent right femoral to below-knee popliteal bypass using saphenous vein on December 12. He presents today for follow-up.  He residual leg swelling. He has been walking better with improved claudication symptoms. He still complains of some numbness and tingling on the inner aspect of his left calf along the saphenous nerve course. He does still complain of some pain on the dorsum of his right foot over a bony prominence and is requesting a visit to the podiatrist for this for a shoe insert.   He is not smoking. He cannot tolerate statins due to pain in his legs and previous problems with renal dysfunction. He is on Zetia for cholesterol lowering. He is on Plavix. He currently is not taking his aspirin while he is on Plavix. He is not smoking.        Past Medical History    Diagnosis   Date    .   Hyperparathyroidism      .   Hypertension      .   Dyslipidemia      .   OA (osteoarthritis)      .   Metatarsalgia      .   Hyperlipidemia          statin intolerant    .   GERD (gastroesophageal reflux disease)      .   Coronary artery disease   2006        stents x2=LAD LCX; totally occluded RCA    .   Seasonal allergies      .   Insomnia      .   Peripheral vascular disease [facility])                Past Surgical History    Procedure   Laterality   Date    .   Cervical spine surgery     2004    .   Cardiac catheterization     10/28/2004        circ and mid LAD chyper stents    .   Cervical fusion     [id number]    .   Incision and drainage abscess posterior cervicalspine     2009    .   Elbow arthroplasty     1994        right    .   Colonoscopy        .   Shoulder open rotator cuff repair   Right   04/23/2013        Procedure: RIGHT TWO TENDON ROTATOR CUFF REPAIR  SUBACROMIAL DECOMPRESSION AND OPEN DISTAL CLAVICLE  RESECTION;  Surgeon: Wyn Forster., MD;  Location: [facility];  Service: Orthopedics;  Laterality: Right;    .   Peripheral vascular catheterization   N/A   01/16/2015        Procedure: Abdominal Aortogram;  Surgeon: Sherren Kerns, MD;  Location: [facility] LAB;  Service: Cardiovascular;  Laterality: N/A;    .   Stents placed      2006    .   Coronary angioplasty     2006        stents placed     .   Femoral-popliteal bypass graft   Left   03/17/2015        Procedure:  BYPASS GRAFT FEMORAL-POPLITEAL ARTERY-LEFT LEG AND POSTERIOR TIBIAL SEQUENTIAL GRAFT TO BELOW KNEE POPLITEAL ARTERY;  Surgeon: Sherren Kernsharles E Fields, MD;  [facility];  Service: Vascular;  Laterality: Left;    .   Endarterectomy femoral   Left   03/17/2015        Procedure: ENDARTERECTOMY COMMON FEMORAL WITH PROFUNDOPLASTY;  Surgeon: Sherren Kernsharles E Fields, MD;  [facility];  Service: Vascular;  Laterality: Left;    .   Patch angioplasty   Left   03/17/2015        Procedure: VEIN PATCH ANGIOPLASTY COMMON FEMORAL ARTERY;  Surgeon: Sherren Kernsharles E Fields, MD;  [facility];  Service: Vascular;  Laterality: Left;    .   Intraoperative arteriogram   Left   03/17/2015        Procedure: INTRA OPERATIVE ARTERIOGRAM X2;  Surgeon: Sherren Kernsharles E Fields, MD;  [facility];  Service: Vascular;  Laterality: Left;         Current Outpatient Prescriptions on File Prior to Visit  Medication Sig Dispense Refill  . acetaminophen (TYLENOL ARTHRITIS PAIN) 650 MG CR tablet Take 1,300 mg by mouth 2 (two) times daily.     Marland Kitchen. aspirin 81 MG tablet Take 1 tablet (81 mg total) by mouth daily. 30 tablet 6  . calcitRIOL (ROCALTROL) 0.5 MCG capsule Take 0.5 mcg by mouth daily.    . calcium-vitamin D (OSCAL WITH D) 500-200 MG-UNIT per tablet Take 2 tablets by mouth. Take 2 tabs Am and 1 tab Pm    . clopidogrel (PLAVIX) 75 MG tablet Take 1 tablet (75 mg total) by mouth daily. 90 tablet 3  . ezetimibe (ZETIA) 10 MG tablet Take 10 mg by mouth  daily.      . fenofibrate 160 MG tablet Take 160 mg by mouth daily.    . fish oil-omega-3 fatty acids 1000 MG capsule Take 4,800 mg by mouth daily. Take 2 tabs. Am and 2 tabs Pm    . glucosamine-chondroitin 500-400 MG tablet Take 1 tablet by mouth 2 (two) times daily.     Marland Kitchen. loratadine (CLARITIN) 10 MG tablet Take 10 mg by mouth daily. Reported on 09/03/2015    . losartan (COZAAR) 100 MG tablet Take 100 mg by mouth daily.     . Multiple Vitamins-Minerals (MULTIVITAMIN WITH MINERALS) tablet Take 1 tablet by mouth daily.    . NONFORMULARY OR COMPOUNDED ITEM Apply 1-2 g topically daily. 180 each 3  . OVER THE COUNTER MEDICATION Take 120 mg by mouth every morning. Reported on 09/03/2015    . oxyCODONE (ROXICODONE) 5 MG immediate release tablet Take 1 tablet (5 mg total) by mouth every 6 (six) hours as needed. 30 tablet 0  . ranitidine (ZANTAC) 150 MG tablet Take 150 mg by mouth 2 (two) times daily.    . traZODone (DESYREL) 50 MG tablet Take 50 mg by mouth at bedtime.    . nitroGLYCERIN (NITROSTAT) 0.4 MG SL tablet Place 1 tablet (0.4 mg total) under the tongue every 5 (five) minutes as needed for chest pain. 25 tablet 12  . Potassium 99 MG TABS Take 1 tablet by mouth daily.     No current facility-administered medications on file prior to visit.        Physical exam:    Vitals:   04/07/16 1355  BP: 140/76  Pulse: 64  Resp: 20  Temp: 98.4 F (36.9 C)  TempSrc: Oral  SpO2: 98%  Weight: 214 lb 3.2 oz (97.2 kg)  Height:  6\' 2"  (1.88 m)    Left lower extremity: 2+ dorsalis pedis posterior tibial pulse  Right lower extremity:  2+ right dorsalis posterior tibial pedis pulse  Neck: No carotid bruits   Chest: Clear to auscultation bilaterally. Cardiac: Regular rate and rhythm without murmur    Data: Patient had bilateral graft duplex scans today which showed widely patent bypass grafts with no anastomotic narrowing ABIs were 0.9 bilaterally   Assessment:  #1 bilateral patent bypasses     Plan:  Doing well status post bilateral femoral-popliteal bypasses. Claudication symptoms relieved at this point. Plan as above patient will follow-up with our nurse practitioner in one year with repeat ABIs and repeat graft duplex.   Fabienne Brunsharles Fields, MD [facility] [place] Office: [phone] Pager: [phone]

## 2016-04-12 NOTE — Addendum Note (Signed)
Addended by: Sharee PimpleMCCHESNEY, Yasemin Rabon K on: 04/12/2016 04:02 PM   Modules accepted: Orders

## 2016-04-13 ENCOUNTER — Encounter: Payer: Self-pay | Admitting: Podiatry

## 2016-04-13 ENCOUNTER — Ambulatory Visit (INDEPENDENT_AMBULATORY_CARE_PROVIDER_SITE_OTHER): Payer: BLUE CROSS/BLUE SHIELD | Admitting: Podiatry

## 2016-04-13 ENCOUNTER — Telehealth: Payer: Self-pay | Admitting: Nurse Practitioner

## 2016-04-13 DIAGNOSIS — M722 Plantar fascial fibromatosis: Secondary | ICD-10-CM

## 2016-04-13 DIAGNOSIS — M779 Enthesopathy, unspecified: Secondary | ICD-10-CM | POA: Diagnosis not present

## 2016-04-13 MED ORDER — TRIAMCINOLONE ACETONIDE 10 MG/ML IJ SUSP
10.0000 mg | Freq: Once | INTRAMUSCULAR | Status: AC
  Administered 2016-04-13: 10 mg

## 2016-04-13 MED ORDER — ASPIRIN EC 81 MG PO TBEC: 81.0000 mg | DELAYED_RELEASE_TABLET | Freq: Every day | ORAL | Status: DC

## 2016-04-13 NOTE — Telephone Encounter (Signed)
Vesta MixerPhilip J Nahser, MD  Levi AlandMichelle M Swinyer, RN        From my standpoint he does not need to be on Plavix. He may resume aspirin 81 mg a day.   If he needs any further peripheral stenting by Dr. Darrick PennaFields and he will likely need to restart the Plavix but I will leave those decisions up to Dr. Darrick PennaFields.      From: Marcelle SmilingKimberly D Mesiemore  Sent: 04/08/2016 10:00 AM  To: Levi AlandMichelle M Swinyer, RN   Burley SaverHey Michelle,   Patient left VM on my machine asking if you could give him a call he stated Dr.Charles Fields w/ [facility] put him Plavix and and he he was asking if he could come off. He was told he has to make sure it was ok with Dr.Nahser to come off. Patient asked if you can call Dr.Fields office please their number is [phone].   Thanks so much, Enjoy your weekend!      Reviewed Dr. Harvie BridgeNahser's advice with patient who verbalized understanding.  He states Dr. Darrick PennaFields advised patient to call our office to determine if plavix can be discontinued and if Dr. Elease HashimotoNahser is in agreement, he can stop it.  Patient states he will start ASA 81 mg daily.  I scheduled him for follow-up with Dr. Elease HashimotoNahser on 1/18.  He thanked me for the call.

## 2016-04-14 NOTE — Progress Notes (Signed)
Subjective:     Patient ID: Derrick [name], male   DOB: 02/13/1956, 60 y.o.   MRN: [id number]  HPI patient is complaining of Problems with one being plantar fascial pain right and the other being tendinitis right. With 2 separate problems. Patient states the pain is worse in the heel but the tendon is also bothering him   Review of Systems     Objective:   Physical Exam Neurovascular status intact muscle strength adequate with inflammation that's improving but still present with secondary problem now tendinitis and plantar fasciitis present right    Assessment:     Plantar fasciitis tendinitis    Plan:     Reviewed both conditions separately and today I injected the fashion 3 Milligan conference working and applied night splint with all instructions on usage along with heat ice therapy. Patient will be seen back for us to recheck again in about 4 weeks or earlier if needed

## 2016-06-16 ENCOUNTER — Ambulatory Visit: Payer: BLUE CROSS/BLUE SHIELD | Admitting: Cardiovascular Disease

## 2016-07-11 ENCOUNTER — Encounter: Payer: Self-pay | Admitting: Cardiovascular Disease

## 2016-07-11 ENCOUNTER — Ambulatory Visit (INDEPENDENT_AMBULATORY_CARE_PROVIDER_SITE_OTHER): Payer: BLUE CROSS/BLUE SHIELD | Admitting: Cardiovascular Disease

## 2016-07-11 VITALS — BP 150/84 | HR 62 | Ht 74.0 in | Wt 218.8 lb

## 2016-07-11 DIAGNOSIS — I2510 Atherosclerotic heart disease of native coronary artery without angina pectoris: Secondary | ICD-10-CM | POA: Diagnosis not present

## 2016-07-11 DIAGNOSIS — E782 Mixed hyperlipidemia: Secondary | ICD-10-CM | POA: Diagnosis not present

## 2016-07-11 NOTE — Patient Instructions (Signed)
Medication Instructions:  Your physician recommends that you continue on your current medications as directed. Please refer to the Current Medication list given to you today.   Labwork: Your physician recommends that you return for lab work in: 1 year on the day of or a few days before your office visit with Dr. Nahser.  You will need to FAST for this appointment - nothing to eat or drink after midnight the night before except water.   Testing/Procedures: None Ordered   Follow-Up: Your physician wants you to follow-up in: 1 year with Dr. Nahser.  You will receive a reminder letter in the mail two months in advance. If you don't receive a letter, please call our office to schedule the follow-up appointment.   If you need a refill on your cardiac medications before your next appointment, please call your pharmacy.   Thank you for choosing [facility]! Lawson Mahone, RN [phone]    

## 2016-07-11 NOTE — Progress Notes (Signed)
Derrick [name] Date of Birth  Sep 09, 1955 [facility] [address]     [address] [place], [place]  [address] [phone]  Fax  [phone]  Problem List: 1. CAD, status post stents to the LAD,  left circumflex artery 2006 . His chronically occluded right coronary artery 2. hyperlipidemia-intolerant to statins 3. Hypertension 4. Peripheral Vascular disease   History of Present Illness:  61 yo male with history of CAD - s/p stents to LAD and LCx.  Chronically occluded RCA.  No angina.  He eats a very fatty diet - lots of fried foods.  Not exercising.  He does lots of yard work and has never had any episodes of angina.  Oct 01, 2013:  Derrick [name] was seen by Lawson Fiscal in Oct. 2014 for pre-op eval prior to rotator cuff surgery.    His myoview study showed:  Low risk stress nuclear study with a large, severe intensity, partially reversible inferior defect consistent with prior inferior infarct and very mild peri-infarct ischemia.  LV Ejection Fraction: 54%. LV Wall Motion: Inferior hypokinesis.  He has shoulder surgery without difficulty.  He's doing well. He denies any chest pain or shortness of breath.  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     Feb. 12 2018:    Has done well.  Has hx of PVD - Fabienne Bruns)   Current Outpatient Prescriptions  Medication Sig Dispense Refill  . acetaminophen (TYLENOL ARTHRITIS PAIN) 650 MG CR tablet Take 1,300 mg by mouth 2 (two) times daily.     Marland Kitchen aspirin EC 81 MG tablet Take 1 tablet (81 mg total) by mouth daily.    . calcitRIOL (ROCALTROL) 0.5 MCG capsule Take 0.5 mcg by mouth daily.    . calcium-vitamin D (OSCAL WITH D) 500-200 MG-UNIT per tablet Take 2 tablets by mouth. Take 2 tabs Am and 1 tab Pm    . ezetimibe (ZETIA) 10 MG tablet Take 10 mg by mouth daily.      . fenofibrate 160 MG tablet Take 160 mg by mouth daily.    . fish oil-omega-3 fatty acids 1000 MG capsule Take 4,800 mg by mouth daily.  Take 2 tabs. Am and 2 tabs Pm    . glucosamine-chondroitin 500-400 MG tablet Take 1 tablet by mouth 2 (two) times daily.     Marland Kitchen losartan (COZAAR) 100 MG tablet Take 100 mg by mouth daily.     . Multiple Vitamins-Minerals (MULTIVITAMIN WITH MINERALS) tablet Take 1 tablet by mouth daily.    . nitroGLYCERIN (NITROSTAT) 0.4 MG SL tablet Place 1 tablet (0.4 mg total) under the tongue every 5 (five) minutes as needed for chest pain. 25 tablet 12  . ranitidine (ZANTAC) 150 MG tablet Take 150 mg by mouth 2 (two) times daily.    . traZODone (DESYREL) 50 MG tablet Take 50 mg by mouth at bedtime.     No current facility-administered medications for this visit.      Allergies  Allergen Reactions  . Atorvastatin     Muscle aches  . Codeine Itching  . Crestor [Rosuvastatin Calcium]     Muscle aches  . Meloxicam Other (See Comments)    Can not take due to kidneys  . Statins Other (See Comments)    myalgia    Past Medical History:  Diagnosis Date  . Coronary artery disease 2006   stents x2=LAD LCX; totally occluded RCA  . Dyslipidemia   . GERD (gastroesophageal  reflux disease)   . History of bronchitis   . Hyperlipidemia    statin intolerant  . Hyperparathyroidism   . Hypertension   . Insomnia   . Metatarsalgia   . OA (osteoarthritis)   . Peripheral vascular disease (HCC)   . Seasonal allergies   . Urinary frequency   . Wears glasses     Past Surgical History:  Procedure Laterality Date  . CARDIAC CATHETERIZATION  10/28/2004   circ and mid LAD chyper stents  . CERVICAL FUSION  [id number]  . CERVICAL SPINE SURGERY  2004  . COLONOSCOPY    . CORONARY ANGIOPLASTY  2006   stents placed   . ELBOW ARTHROPLASTY  1994   right  . ENDARTERECTOMY FEMORAL Left 03/17/2015   Procedure: ENDARTERECTOMY COMMON FEMORAL WITH PROFUNDOPLASTY;  Surgeon: Sherren Kernsharles E Fields, MD;  [facility];  Service: Vascular;  Laterality: Left;  . FEMORAL-POPLITEAL BYPASS GRAFT Left 03/17/2015   Procedure: BYPASS  GRAFT FEMORAL-POPLITEAL ARTERY-LEFT LEG AND POSTERIOR TIBIAL SEQUENTIAL GRAFT TO BELOW KNEE POPLITEAL ARTERY;  Surgeon: Sherren Kernsharles E Fields, MD;  [facility];  Service: Vascular;  Laterality: Left;  . FEMORAL-POPLITEAL BYPASS GRAFT Right 05/11/2015   Procedure:  RIGHT FEMORAL-BELOW THE KNEE POPLITEAL ARTERY BYPASS GRAFT USING NON REVERSE RIGHT GREATER SAPHENOUS VEIN;  Surgeon: Sherren Kernsharles E Fields, MD;  [facility];  Service: Vascular;  Laterality: Right;  . INCISION AND DRAINAGE ABSCESS POSTERIOR CERVICALSPINE  2009  . INTRAOPERATIVE ARTERIOGRAM Left 03/17/2015   Procedure: INTRA OPERATIVE ARTERIOGRAM X2;  Surgeon: Sherren Kernsharles E Fields, MD;  [facility];  Service: Vascular;  Laterality: Left;  . INTRAOPERATIVE ARTERIOGRAM Right 05/11/2015   Procedure: INTRA OPERATIVE ARTERIOGRAM;  Surgeon: Sherren Kernsharles E Fields, MD;  [facility];  Service: Vascular;  Laterality: Right;  . PATCH ANGIOPLASTY Left 03/17/2015   Procedure: VEIN PATCH ANGIOPLASTY COMMON FEMORAL ARTERY;  Surgeon: Sherren Kernsharles E Fields, MD;  [facility];  Service: Vascular;  Laterality: Left;  . PERIPHERAL VASCULAR CATHETERIZATION N/A 01/16/2015   Procedure: Abdominal Aortogram;  Surgeon: Sherren Kernsharles E Fields, MD;  [facility];  Service: Cardiovascular;  Laterality: N/A;  . SHOULDER OPEN ROTATOR CUFF REPAIR Right 04/23/2013   Procedure: RIGHT TWO TENDON ROTATOR CUFF REPAIR  SUBACROMIAL DECOMPRESSION AND OPEN DISTAL CLAVICLE RESECTION;  Surgeon: Wyn Forsterobert V Sypher Jr., MD;  Location: [facility];  Service: Orthopedics;  Laterality: Right;  . stents placed   2006  . VEIN HARVEST Right 05/11/2015   Procedure: WITH NON REVERSE RIGHT GREATER SAPHENOUS VEIN HARVEST;  Surgeon: Sherren Kernsharles E Fields, MD;  [facility];  Service: Vascular;  Laterality: Right;    History  Smoking Status  . Former Smoker  . Types: Cigarettes  . Quit date: 05/30/1989  Smokeless Tobacco  . Never Used    History  Alcohol Use  . 0.0 oz/week     Comment: one drink a month    Family History  Problem Relation Age of Onset  . Arthritis Mother   . Varicose Veins Mother   . Lung cancer Father   . COPD Father     Lung  . Cancer Brother     Lukemia  . COPD Brother   . Cancer Brother     Prostate    Reviw of Systems:  Reviewed in the HPI.  All other systems are negative.  Physical Exam: BP (!) 150/84 (BP Location: Left Arm, Patient Position: Sitting, Cuff Size: Normal)   Pulse 62   Ht 6\' 2"  (1.88 m)  Wt 218 lb 12.8 oz (99.2 kg)   SpO2 96%   BMI 28.09 kg/m  The patient is alert and oriented x 3.  The mood and affect are normal.  The skin is warm and dry.  Color is normal.  The HEENT exam reveals that the sclera are nonicteric.  The mucous membranes are moist.  The carotids are 2+ without bruits.  There is no thyromegaly.  There is no JVD.  The lungs are clear.  The chest wall is non tender.  The heart exam reveals a regular rate with a normal S1 and S2.  There are no murmurs, gallops, or rubs.  The PMI is not displaced.   Abdominal exam reveals good bowel sounds.  There is no guarding or rebound.  There is no hepatosplenomegaly or tenderness.  There are no masses.  Exam of the legs reveal no clubbing, cyanosis, or edema.  The legs are without rashes.  The distal pulses are intact.  Cranial nerves II - XII are intact.  Motor and sensory functions are intact.  The gait is normal.  ECG: Feb. 12, 2018:   NSR at 62.    Assessment / Plan:    1. CAD - stable. He's not had any episodes of angina. His lipid levels have been managed by his primary medical doctor. He is intolerant to statins. We discussed possibly sending him to lipid clinic. He'll discuss this with his wife and get back to [place].  2. PVD - follow up with Dr. Nash Shearer, MD  07/11/2016 4:27 PM    [facility] [address] [place],  [address] [place], [place]  [address] Pager [phone] Phone: [phone]; Fax: [phone]

## 2016-09-13 ENCOUNTER — Encounter: Payer: Self-pay | Admitting: Cardiology

## 2016-09-20 ENCOUNTER — Encounter: Payer: Self-pay | Admitting: Cardiology

## 2017-02-27 ENCOUNTER — Encounter: Payer: Self-pay | Admitting: Cardiovascular Disease

## 2017-02-27 ENCOUNTER — Encounter: Payer: Self-pay | Admitting: Nurse Practitioner

## 2017-02-27 ENCOUNTER — Ambulatory Visit (INDEPENDENT_AMBULATORY_CARE_PROVIDER_SITE_OTHER): Payer: BLUE CROSS/BLUE SHIELD | Admitting: Cardiovascular Disease

## 2017-02-27 ENCOUNTER — Telehealth: Payer: Self-pay | Admitting: Cardiovascular Disease

## 2017-02-27 VITALS — BP 158/76 | HR 59 | Ht 74.0 in | Wt 217.8 lb

## 2017-02-27 DIAGNOSIS — E782 Mixed hyperlipidemia: Secondary | ICD-10-CM | POA: Diagnosis not present

## 2017-02-27 DIAGNOSIS — I25110 Atherosclerotic heart disease of native coronary artery with unstable angina pectoris: Secondary | ICD-10-CM | POA: Diagnosis not present

## 2017-02-27 MED ORDER — NITROGLYCERIN 0.4 MG SL SUBL: 0.4000 mg | SUBLINGUAL_TABLET | SUBLINGUAL | 12 refills | Status: DC | PRN

## 2017-02-27 NOTE — Patient Instructions (Addendum)
Medication Instructions:  Your physician recommends that you continue on your current medications as directed. Please refer to the Current Medication list given to you today. Pick up your nitroglycerin  Labwork: TODAY - Pt/INR, BMET, CBC   Testing/Procedures: None Ordered   Follow-Up: Your physician recommends that you schedule a follow-up appointment in: 2 months with Dr. Denyse Amass [facility]ARDIOV[facility] DIVISION [facility] ST OFFICE [address], [address] [place] [address] Dept: [phone] Loc: [phone]  Derrick [name]  02/27/2017  You are scheduled for a Cardiac Catheterization on Wednesday, October 3 with Dr. [name].  1. Please arrive at the [facility] (Main Entrance A) at [facility]: [address] [place], [place] [address] at 12:30 PM (two hours before your procedure to ensure your preparation). Free valet parking service is available.   Special note: Every effort is made to have your procedure done on time. Please understand that emergencies sometimes delay scheduled procedures.  2. Diet: You may have a clear liquid breakfast, but nothing after 8 AM on your procedure day.  3. Labs: You will need to have blood drawn on Monday, October 1 at [facility]. [address], [place]  Open: 7:30am - 5pm    Phone: [phone]. You do not need to be fasting.  4. Medication instructions in preparation for your procedure:    On the morning of your procedure, take your Aspirin and any morning medicines NOT listed above.  You may use sips of water.  5. Plan for one night stay--bring personal belongings. 6. Bring a current list of your medications and current insurance cards. 7. You MUST have a responsible person to drive you home. 8. Someone MUST be with you the first 24 hours after you arrive home or your discharge will be delayed. 9. Please wear clothes that are  easy to get on and off and wear slip-on shoes.  Thank you for allowing [place] to care for you!   -- [facility]   If you need a refill on your cardiac medications before your next appointment, please call your pharmacy.   Thank you for choosing [facility]! Eligha Bridegroom, RN [phone]

## 2017-02-27 NOTE — Telephone Encounter (Signed)
Spoke with patient who states he has had worsening DOE and chest pain recently, most notably while pruning branches in his yard. He states he did not take NTG because his Rx expired; I advised we will reorder. He reports that his BP at the time of this episode was 150/74 mmHg and then 142/70 mmHg about 10 minutes later. He does not know what his HR was. He reports worsening symptoms x 1 month. He has a hx of CAD. He was scheduled to see Nada Boozer, PA next week when he called. I scheduled him to see Dr. Elease Hashimoto today and advised that we will determine if 10/11 appointment is needed after he his seen today. He verbalized understanding and agreement and thanked me for the call.

## 2017-02-27 NOTE — Progress Notes (Signed)
Derrick [name] Date of Birth  08/27/1955 [facility] [address]     [address] [place], [place]  [address] [phone]  Fax  [phone]  Problem List: 1. CAD, status post stents to the LAD,  left circumflex artery 2006 . His chronically occluded right coronary artery 2. hyperlipidemia-intolerant to statins 3. Hypertension 4. Peripheral Vascular disease   History of Present Illness:  61 yo male with history of CAD - s/p stents to LAD and LCx.  Chronically occluded RCA.  No angina.  He eats a very fatty diet - lots of fried foods.  Not exercising.  He does lots of yard work and has never had any episodes of angina.  Oct 01, 2013:  Derrick [name] was seen by Lori in Oct. 2014 for pre-op eval prior to rotator cuff surgery.    His myoview study showed:  Low risk stress nuclear study with a large, severe intensity, partially reversible inferior defect consistent with prior inferior infarct and very mild peri-infarct ischemia.  LV Ejection Fraction: 54%. LV Wall Motion: Inferior hypokinesis.  He has shoulder surgery without difficulty.  He's doing well. He denies any chest pain or shortness of breath.  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     Feb. 12 2018:    Has done well.  Has hx of PVD - (Charles Fields)   Oct. 1 2018;    Derrick [name] has been having some DOE , chest pain with exertion Multiple occasions over the past month,,   Gradually getting worse   Occurred while he was pruning and cleaning up his yard.  Is exercising more.  Works 11 hours a day . 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  Symptoms feel very similar to his previous symptoms in 2006   Current Outpatient Prescriptions  Medication Sig Dispense Refill  . acetaminophen (TYLENOL ARTHRITIS PAIN) 650 MG CR tablet Take 1,300 mg by mouth 2 (two) times daily.     . aspirin EC 81 MG tablet Take 1 tablet (81 mg total) by mouth daily.    . calcitRIOL (ROCALTROL) 0.5 MCG capsule Take  0.5 mcg by mouth daily.    . calcium-vitamin D (OSCAL WITH D) 500-200 MG-UNIT per tablet Take 2 tablets by mouth. Take 2 tabs Am and 1 tab Pm    . ezetimibe (ZETIA) 10 MG tablet Take 10 mg by mouth daily.      . fenofibrate 160 MG tablet Take 160 mg by mouth daily.    . fish oil-omega-3 fatty acids 1000 MG capsule Take 4,800 mg by mouth daily. Take 2 tabs. Am and 2 tabs Pm    . gabapentin (NEURONTIN) 300 MG capsule Take 300 mg by mouth daily.  6  . glucosamine-chondroitin 500-400 MG tablet Take 1 tablet by mouth 2 (two) times daily.     . losartan (COZAAR) 100 MG tablet Take 100 mg by mouth daily.     . Multiple Vitamins-Minerals (MULTIVITAMIN WITH MINERALS) tablet Take 1 tablet by mouth daily.    . ranitidine (ZANTAC) 150 MG tablet Take 150 mg by mouth 2 (two) times daily.    . traZODone (DESYREL) 50 MG tablet Take 50 mg by mouth at bedtime.    . nitroGLYCERIN (NITROSTAT) 0.4 MG SL tablet Place 1 tablet (0.4 mg total) under the tongue every 5 (five) minutes as needed for chest pain. 25 tablet 12   No current facility-administered medications for this visit.        Allergies  Allergen Reactions  . Atorvastatin     Muscle aches  . Codeine Itching  . Crestor [Rosuvastatin Calcium]     Muscle aches  . Meloxicam Other (See Comments)    Can not take due to kidneys  . Statins Other (See Comments)    myalgia    Past Medical History:  Diagnosis Date  . Coronary artery disease 2006   stents x2=LAD LCX; totally occluded RCA  . Dyslipidemia   . GERD (gastroesophageal reflux disease)   . History of bronchitis   . Hyperlipidemia    statin intolerant  . Hyperparathyroidism   . Hypertension   . Insomnia   . Metatarsalgia   . OA (osteoarthritis)   . Peripheral vascular disease (HCC)   . Seasonal allergies   . Urinary frequency   . Wears glasses     Past Surgical History:  Procedure Laterality Date  . CARDIAC CATHETERIZATION  10/28/2004   circ and mid LAD chyper stents  . CERVICAL  FUSION  2007,2009  . CERVICAL SPINE SURGERY  2004  . COLONOSCOPY    . CORONARY ANGIOPLASTY  2006   stents placed   . ELBOW ARTHROPLASTY  1994   right  . ENDARTERECTOMY FEMORAL Left 03/17/2015   Procedure: ENDARTERECTOMY COMMON FEMORAL WITH PROFUNDOPLASTY;  Surgeon: Charles E Fields, MD;  [facility];  Service: Vascular;  Laterality: Left;  . FEMORAL-POPLITEAL BYPASS GRAFT Left 03/17/2015   Procedure: BYPASS GRAFT FEMORAL-POPLITEAL ARTERY-LEFT LEG AND POSTERIOR TIBIAL SEQUENTIAL GRAFT TO BELOW KNEE POPLITEAL ARTERY;  Surgeon: Charles E Fields, MD;  [facility]: [facility] OR;  Service: Vascular;  Laterality: Left;  . FEMORAL-POPLITEAL BYPASS GRAFT Right 05/11/2015   Procedure:  RIGHT FEMORAL-BELOW THE KNEE POPLITEAL ARTERY BYPASS GRAFT USING NON REVERSE RIGHT GREATER SAPHENOUS VEIN;  Surgeon: Charles E Fields, MD;  [facility] OR;  Service: Vascular;  Laterality: Right;  . INCISION AND DRAINAGE ABSCESS POSTERIOR CERVICALSPINE  2009  . INTRAOPERATIVE ARTERIOGRAM Left 03/17/2015   Procedure: INTRA OPERATIVE ARTERIOGRAM X2;  Surgeon: Charles E Fields, MD;  [facility];  Service: Vascular;  Laterality: Left;  . INTRAOPERATIVE ARTERIOGRAM Right 05/11/2015   Procedure: INTRA OPERATIVE ARTERIOGRAM;  Surgeon: Charles E Fields, MD;  [facility];  Service: Vascular;  Laterality: Right;  . PATCH ANGIOPLASTY Left 03/17/2015   Procedure: VEIN PATCH ANGIOPLASTY COMMON FEMORAL ARTERY;  Surgeon: Charles E Fields, MD;  [facility];  Service: Vascular;  Laterality: Left;  . PERIPHERAL VASCULAR CATHETERIZATION N/A 01/16/2015   Procedure: Abdominal Aortogram;  Surgeon: Charles E Fields, MD;  [facility] INV[facility]IVE [facility] LAB;  Service: Cardiovascular;  Laterality: N/A;  . SHOULDER OPEN ROTATOR CUFF REPAIR Right 04/23/2013   Procedure: RIGHT TWO TENDON ROTATOR CUFF REPAIR  SUBACROMIAL DECOMPRESSION AND OPEN DISTAL CLAVICLE RESECTION;  Surgeon: Robert V Sypher Jr., MD;  Location: [facility];  Service:  Orthopedics;  Laterality: Right;  . stents placed   2006  . VEIN HARVEST Right 05/11/2015   Procedure: WITH NON REVERSE RIGHT GREATER SAPHENOUS VEIN HARVEST;  Surgeon: Charles E Fields, MD;  [facility];  Service: Vascular;  Laterality: Right;    History  Smoking Status  . Former Smoker  . Types: Cigarettes  . Quit date: 05/30/1989  Smokeless Tobacco  . Never Used    History  Alcohol Use  . 0.0 oz/week    Comment: one drink a month    Family History  Problem Relation Age of Onset  . Arthritis Mother   . Varicose Veins   Mother   . Lung cancer Father   . COPD Father        Lung  . Cancer Brother        Lukemia  . COPD Brother   . Cancer Brother        Prostate    Reviw of Systems:  Reviewed in the HPI.  All other systems are negative.  Physical Exam: Blood pressure (!) 158/76, pulse (!) 59, height 6' 2" (1.88 m), weight 217 lb 12.8 oz (98.8 kg), SpO2 96 %.  GEN:  Well nourished, well developed in no acute distress HEENT: Normal NECK: No JVD; No carotid bruits LYMPHATICS: No lymphadenopathy CARDIAC: RR, no murmurs, rubs, gallops RESPIRATORY:  Clear to auscultation without rales, wheezing or rhonchi  ABDOMEN: Soft, non-tender, non-distended MUSCULOSKELETAL:  No edema; No deformity  SKIN: Warm and dry NEUROLOGIC:  Alert and oriented x 3   ECG: Oct. 1, 2018:   Sinus brady at 59.  Inc. RBBB. NS ST abn.  Small Q wave in aVL with minimal ST elevatein - no significant changes from previous   Assessment / Plan:    1. CAD -  Derrick [name] presents today as a work in visit for episodes of exertional angina. He has previous stenting in 2006. He now presents with chest tightness and headache related to exertion. The symptoms are very similar to his previous episodes of angina in 2006. He's had them now for a month and they seem to be progressing. We will refill his nitroglycerin. We will schedule him for a heart catheterization. We have discussed the risks, benefits, and  options of heart cath. He understands and agrees to proceed  2. PVD -  Stable      Lovella Hardie, MD  02/27/2017 3:36 PM    [facility] [address],  [address] [place], [place]  [address] Pager [phone] Phone: [phone]; Fax: [phone]    

## 2017-02-27 NOTE — Telephone Encounter (Signed)
°  New Prob  Pt c/o of Chest Pain: STAT if CP now or developed within 24 hours  1. Are you having CP right now? No  2. Are you experiencing any other symptoms (ex. SOB, nausea, vomiting, sweating)? SOB  3. How long have you been experiencing CP? Wife is unsure  4. Is your CP continuous or coming and going? Coming and going. Wife states symptoms come on when he is exerting himself  5. Have you taken Nitroglycerin? No ?

## 2017-02-28 ENCOUNTER — Telehealth: Payer: Self-pay

## 2017-02-28 LAB — PROTIME-INR
INR: 1 (ref 0.8–1.2)
Prothrombin Time: 10.5 s (ref 9.1–12.0)

## 2017-02-28 LAB — BASIC METABOLIC PANEL
BUN/Creatinine Ratio: 18 (ref 10–24)
BUN: 24 mg/dL (ref 8–27)
CO2: 24 mmol/L (ref 20–29)
Calcium: 9.1 mg/dL (ref 8.6–10.2)
Chloride: 99 mmol/L (ref 96–106)
Creatinine, Ser: 1.33 mg/dL — ABNORMAL HIGH (ref 0.76–1.27)
GFR calc Af Amer: 65 mL/min/{1.73_m2} (ref >59)
GFR calc non Af Amer: 56 mL/min/{1.73_m2} — ABNORMAL LOW (ref >59)
Glucose: 94 mg/dL (ref 65–99)
Potassium: 4.7 mmol/L (ref 3.5–5.2)
Sodium: 140 mmol/L (ref 134–144)

## 2017-02-28 LAB — CBC
Hematocrit: 36.7 % — ABNORMAL LOW (ref 37.5–51.0)
Hemoglobin: 12.9 g/dL — ABNORMAL LOW (ref 13.0–17.7)
MCH: 29.8 pg (ref 26.6–33.0)
MCHC: 35.1 g/dL (ref 31.5–35.7)
MCV: 85 fL (ref 79–97)
Platelets: 375 10*3/uL (ref 150–379)
RBC: 4.33 x10E6/uL (ref 4.14–5.80)
RDW: 13.7 % (ref 12.3–15.4)
WBC: 8.8 10*3/uL (ref 3.4–10.8)

## 2017-02-28 NOTE — Telephone Encounter (Signed)
Patient contacted pre-catheterization at [facility] scheduled for:  03/01/2017 @ 1430 Verified arrival time and place:  NT @ 1230 Confirmed AM meds to be taken pre-cath with sip of water: Take ASA Confirmed patient has responsible person to drive home post procedure and observe patient for 24 hours:  yes Addl concerns:  none

## 2017-03-01 ENCOUNTER — Inpatient Hospital Stay (HOSPITAL_COMMUNITY)
Admission: AD | Admit: 2017-03-01 | Discharge: 2017-03-08 | DRG: 234 | Disposition: A | Discharge status: Home / Self Care | Payer: BLUE CROSS/BLUE SHIELD | Source: Ambulatory Visit | Attending: Thoracic Surgery (Cardiothoracic Vascular Surgery) | Admitting: Thoracic Surgery (Cardiothoracic Vascular Surgery)

## 2017-03-01 ENCOUNTER — Inpatient Hospital Stay (HOSPITAL_COMMUNITY): Payer: BLUE CROSS/BLUE SHIELD

## 2017-03-01 ENCOUNTER — Ambulatory Visit (HOSPITAL_COMMUNITY): Payer: BLUE CROSS/BLUE SHIELD

## 2017-03-01 ENCOUNTER — Other Ambulatory Visit: Payer: Self-pay

## 2017-03-01 ENCOUNTER — Inpatient Hospital Stay (HOSPITAL_COMMUNITY)
Admission: AD | Disposition: A | Discharge status: Home / Self Care | Payer: Self-pay | Source: Ambulatory Visit | Attending: Thoracic Surgery (Cardiothoracic Vascular Surgery)

## 2017-03-01 DIAGNOSIS — J302 Other seasonal allergic rhinitis: Secondary | ICD-10-CM | POA: Diagnosis present

## 2017-03-01 DIAGNOSIS — Z955 Presence of coronary angioplasty implant and graft: Secondary | ICD-10-CM | POA: Diagnosis not present

## 2017-03-01 DIAGNOSIS — I25110 Atherosclerotic heart disease of native coronary artery with unstable angina pectoris: Secondary | ICD-10-CM | POA: Diagnosis present

## 2017-03-01 DIAGNOSIS — I97190 Other postprocedural cardiac functional disturbances following cardiac surgery: Secondary | ICD-10-CM | POA: Diagnosis not present

## 2017-03-01 DIAGNOSIS — Z8261 Family history of arthritis: Secondary | ICD-10-CM

## 2017-03-01 DIAGNOSIS — Z87891 Personal history of nicotine dependence: Secondary | ICD-10-CM

## 2017-03-01 DIAGNOSIS — I739 Peripheral vascular disease, unspecified: Secondary | ICD-10-CM | POA: Diagnosis present

## 2017-03-01 DIAGNOSIS — I481 Persistent atrial fibrillation: Secondary | ICD-10-CM | POA: Diagnosis not present

## 2017-03-01 DIAGNOSIS — I25118 Atherosclerotic heart disease of native coronary artery with other forms of angina pectoris: Secondary | ICD-10-CM | POA: Diagnosis not present

## 2017-03-01 DIAGNOSIS — E213 Hyperparathyroidism, unspecified: Secondary | ICD-10-CM | POA: Diagnosis present

## 2017-03-01 DIAGNOSIS — R079 Chest pain, unspecified: Secondary | ICD-10-CM

## 2017-03-01 DIAGNOSIS — Z886 Allergy status to analgesic agent status: Secondary | ICD-10-CM | POA: Diagnosis not present

## 2017-03-01 DIAGNOSIS — K5900 Constipation, unspecified: Secondary | ICD-10-CM | POA: Diagnosis not present

## 2017-03-01 DIAGNOSIS — I454 Nonspecific intraventricular block: Secondary | ICD-10-CM | POA: Diagnosis present

## 2017-03-01 DIAGNOSIS — Z96621 Presence of right artificial elbow joint: Secondary | ICD-10-CM | POA: Diagnosis present

## 2017-03-01 DIAGNOSIS — I4510 Unspecified right bundle-branch block: Secondary | ICD-10-CM | POA: Diagnosis present

## 2017-03-01 DIAGNOSIS — Z951 Presence of aortocoronary bypass graft: Secondary | ICD-10-CM | POA: Diagnosis not present

## 2017-03-01 DIAGNOSIS — I200 Unstable angina: Secondary | ICD-10-CM | POA: Diagnosis not present

## 2017-03-01 DIAGNOSIS — R197 Diarrhea, unspecified: Secondary | ICD-10-CM | POA: Diagnosis not present

## 2017-03-01 DIAGNOSIS — Z885 Allergy status to narcotic agent status: Secondary | ICD-10-CM | POA: Diagnosis not present

## 2017-03-01 DIAGNOSIS — Z973 Presence of spectacles and contact lenses: Secondary | ICD-10-CM | POA: Diagnosis not present

## 2017-03-01 DIAGNOSIS — E785 Hyperlipidemia, unspecified: Secondary | ICD-10-CM | POA: Diagnosis present

## 2017-03-01 DIAGNOSIS — J9811 Atelectasis: Secondary | ICD-10-CM | POA: Diagnosis not present

## 2017-03-01 DIAGNOSIS — Z888 Allergy status to other drugs, medicaments and biological substances status: Secondary | ICD-10-CM | POA: Diagnosis not present

## 2017-03-01 DIAGNOSIS — K219 Gastro-esophageal reflux disease without esophagitis: Secondary | ICD-10-CM | POA: Diagnosis present

## 2017-03-01 DIAGNOSIS — Z801 Family history of malignant neoplasm of trachea, bronchus and lung: Secondary | ICD-10-CM

## 2017-03-01 DIAGNOSIS — I2582 Chronic total occlusion of coronary artery: Secondary | ICD-10-CM | POA: Diagnosis present

## 2017-03-01 DIAGNOSIS — Z981 Arthrodesis status: Secondary | ICD-10-CM

## 2017-03-01 DIAGNOSIS — I700 Atherosclerosis of aorta: Secondary | ICD-10-CM | POA: Diagnosis present

## 2017-03-01 DIAGNOSIS — T474X5A Adverse effect of other laxatives, initial encounter: Secondary | ICD-10-CM | POA: Diagnosis not present

## 2017-03-01 DIAGNOSIS — Z23 Encounter for immunization: Secondary | ICD-10-CM | POA: Diagnosis not present

## 2017-03-01 DIAGNOSIS — Y92230 Patient room in hospital as the place of occurrence of the external cause: Secondary | ICD-10-CM | POA: Diagnosis not present

## 2017-03-01 DIAGNOSIS — D62 Acute posthemorrhagic anemia: Secondary | ICD-10-CM | POA: Diagnosis not present

## 2017-03-01 DIAGNOSIS — I10 Essential (primary) hypertension: Secondary | ICD-10-CM | POA: Diagnosis present

## 2017-03-01 DIAGNOSIS — E8770 Fluid overload, unspecified: Secondary | ICD-10-CM | POA: Diagnosis not present

## 2017-03-01 DIAGNOSIS — Z7982 Long term (current) use of aspirin: Secondary | ICD-10-CM

## 2017-03-01 DIAGNOSIS — Z8042 Family history of malignant neoplasm of prostate: Secondary | ICD-10-CM

## 2017-03-01 DIAGNOSIS — Z79899 Other long term (current) drug therapy: Secondary | ICD-10-CM

## 2017-03-01 DIAGNOSIS — M1990 Unspecified osteoarthritis, unspecified site: Secondary | ICD-10-CM | POA: Diagnosis present

## 2017-03-01 DIAGNOSIS — Z806 Family history of leukemia: Secondary | ICD-10-CM

## 2017-03-01 DIAGNOSIS — Z825 Family history of asthma and other chronic lower respiratory diseases: Secondary | ICD-10-CM

## 2017-03-01 DIAGNOSIS — E782 Mixed hyperlipidemia: Secondary | ICD-10-CM | POA: Diagnosis not present

## 2017-03-01 DIAGNOSIS — R600 Localized edema: Secondary | ICD-10-CM | POA: Diagnosis not present

## 2017-03-01 HISTORY — PX: LEFT HEART CATH AND CORONARY ANGIOGRAPHY: CATH118249

## 2017-03-01 LAB — BLOOD GAS, ARTERIAL
Acid-Base Excess: 0.2 mmol/L (ref 0.0–2.0)
Bicarbonate: 24.5 mmol/L (ref 20.0–28.0)
Drawn by: 252031
FIO2: 21
O2 Saturation: 96.1 %
Patient temperature: 98.6
pCO2 arterial: 41.3 mmHg (ref 32.0–48.0)
pH, Arterial: 7.391 (ref 7.350–7.450)
pO2, Arterial: 85.7 mmHg (ref 83.0–108.0)

## 2017-03-01 LAB — PROTIME-INR
INR: 1.02
Prothrombin Time: 13.3 seconds (ref 11.4–15.2)

## 2017-03-01 LAB — APTT: aPTT: 28 seconds (ref 24–36)

## 2017-03-01 LAB — HEMOGLOBIN A1C
Hgb A1c MFr Bld: 5.4 % (ref 4.8–5.6)
Mean Plasma Glucose: 108.28 mg/dL

## 2017-03-01 SURGERY — LEFT HEART CATH AND CORONARY ANGIOGRAPHY
Anesthesia: LOCAL

## 2017-03-01 MED ORDER — VERAPAMIL HCL 2.5 MG/ML IV SOLN
INTRAVENOUS | Status: AC
  Filled 2017-03-01: qty 2

## 2017-03-01 MED ORDER — ASPIRIN 81 MG PO CHEW: 81.0000 mg | CHEWABLE_TABLET | ORAL | Status: DC

## 2017-03-01 MED ORDER — IOPAMIDOL (ISOVUE-370) INJECTION 76%
INTRAVENOUS | Status: DC | PRN
  Administered 2017-03-01: 75 mL

## 2017-03-01 MED ORDER — VANCOMYCIN HCL 10 G IV SOLR
1500.0000 mg | INTRAVENOUS | Status: AC
  Administered 2017-03-02: 1500 mg via INTRAVENOUS
  Filled 2017-03-01: qty 1500

## 2017-03-01 MED ORDER — GABAPENTIN 300 MG PO CAPS
300.0000 mg | ORAL_CAPSULE | Freq: Every evening | ORAL | Status: DC
  Administered 2017-03-01: 300 mg via ORAL
  Filled 2017-03-01: qty 1

## 2017-03-01 MED ORDER — TRANEXAMIC ACID (OHS) PUMP PRIME SOLUTION
2.0000 mg/kg | INTRAVENOUS | Status: DC
Start: 2017-03-02 — End: 2017-03-02
  Filled 2017-03-01: qty 1.97

## 2017-03-01 MED ORDER — SODIUM CHLORIDE 0.9 % WEIGHT BASED INFUSION: 1.0000 mL/kg/h | INTRAVENOUS | Status: DC

## 2017-03-01 MED ORDER — ACETAMINOPHEN ER 650 MG PO TBCR: 1300.0000 mg | EXTENDED_RELEASE_TABLET | Freq: Two times a day (BID) | ORAL | Status: DC

## 2017-03-01 MED ORDER — NITROGLYCERIN 0.4 MG SL SUBL: 0.4000 mg | SUBLINGUAL_TABLET | SUBLINGUAL | Status: DC | PRN

## 2017-03-01 MED ORDER — DOPAMINE-DEXTROSE 3.2-5 MG/ML-% IV SOLN
0.0000 ug/kg/min | INTRAVENOUS | Status: DC
  Filled 2017-03-01: qty 250

## 2017-03-01 MED ORDER — SODIUM CHLORIDE 0.9 % IV SOLN
30.0000 ug/min | INTRAVENOUS | Status: DC
  Filled 2017-03-01: qty 2

## 2017-03-01 MED ORDER — DIAZEPAM 5 MG PO TABS
5.0000 mg | ORAL_TABLET | Freq: Once | ORAL | Status: AC
  Administered 2017-03-02: 5 mg via ORAL
  Filled 2017-03-01: qty 1

## 2017-03-01 MED ORDER — BISACODYL 5 MG PO TBEC: 5.0000 mg | DELAYED_RELEASE_TABLET | Freq: Once | ORAL | Status: DC

## 2017-03-01 MED ORDER — SODIUM CHLORIDE 0.9% FLUSH: 3.0000 mL | Freq: Two times a day (BID) | INTRAVENOUS | Status: DC

## 2017-03-01 MED ORDER — FENOFIBRATE 160 MG PO TABS
160.0000 mg | ORAL_TABLET | Freq: Every day | ORAL | Status: DC
  Administered 2017-03-01: 160 mg via ORAL
  Filled 2017-03-01: qty 1

## 2017-03-01 MED ORDER — DEXTROSE 5 % IV SOLN
750.0000 mg | INTRAVENOUS | Status: DC
  Filled 2017-03-01: qty 750

## 2017-03-01 MED ORDER — ALPRAZOLAM 0.25 MG PO TABS: 0.2500 mg | ORAL_TABLET | ORAL | Status: DC | PRN

## 2017-03-01 MED ORDER — FLUTICASONE PROPIONATE 50 MCG/ACT NA SUSP
2.0000 | Freq: Every day | NASAL | Status: DC | PRN
Start: 2017-03-01 — End: 2017-03-02

## 2017-03-01 MED ORDER — FENTANYL CITRATE (PF) 100 MCG/2ML IJ SOLN
INTRAMUSCULAR | Status: DC | PRN
  Administered 2017-03-01: 50 ug via INTRAVENOUS

## 2017-03-01 MED ORDER — HEPARIN (PORCINE) IN NACL 2-0.9 UNIT/ML-% IJ SOLN
INTRAMUSCULAR | Status: AC | PRN
  Administered 2017-03-01: 1000 mL

## 2017-03-01 MED ORDER — POTASSIUM CHLORIDE 2 MEQ/ML IV SOLN
80.0000 meq | INTRAVENOUS | Status: DC
  Filled 2017-03-01: qty 40

## 2017-03-01 MED ORDER — DEXMEDETOMIDINE HCL IN NACL 400 MCG/100ML IV SOLN
0.1000 ug/kg/h | INTRAVENOUS | Status: AC
  Administered 2017-03-02: .5 ug/kg/h via INTRAVENOUS
  Filled 2017-03-01: qty 100

## 2017-03-01 MED ORDER — IOPAMIDOL (ISOVUE-370) INJECTION 76%
INTRAVENOUS | Status: AC
  Filled 2017-03-01: qty 100

## 2017-03-01 MED ORDER — VITAMIN B-12 1000 MCG PO TABS: 2500.0000 ug | ORAL_TABLET | Freq: Every day | ORAL | Status: DC

## 2017-03-01 MED ORDER — ASPIRIN EC 81 MG PO TBEC: 81.0000 mg | DELAYED_RELEASE_TABLET | Freq: Every day | ORAL | Status: DC

## 2017-03-01 MED ORDER — TRANEXAMIC ACID 1000 MG/10ML IV SOLN
1.5000 mg/kg/h | INTRAVENOUS | Status: AC
  Administered 2017-03-02: 1.5 mg/kg/h via INTRAVENOUS
  Filled 2017-03-01: qty 25

## 2017-03-01 MED ORDER — MIDAZOLAM HCL 2 MG/2ML IJ SOLN
INTRAMUSCULAR | Status: DC | PRN
  Administered 2017-03-01: 2 mg via INTRAVENOUS

## 2017-03-01 MED ORDER — HEPARIN SODIUM (PORCINE) 1000 UNIT/ML IJ SOLN
INTRAMUSCULAR | Status: DC | PRN
  Administered 2017-03-01: 5000 [IU] via INTRAVENOUS

## 2017-03-01 MED ORDER — TEMAZEPAM 15 MG PO CAPS: 15.0000 mg | ORAL_CAPSULE | Freq: Once | ORAL | Status: DC | PRN

## 2017-03-01 MED ORDER — LIDOCAINE HCL 2 % IJ SOLN
INTRAMUSCULAR | Status: AC
  Filled 2017-03-01: qty 10

## 2017-03-01 MED ORDER — SODIUM CHLORIDE 0.9 % IV SOLN: 250.0000 mL | INTRAVENOUS | Status: DC | PRN

## 2017-03-01 MED ORDER — HEPARIN SODIUM (PORCINE) 1000 UNIT/ML IJ SOLN
INTRAMUSCULAR | Status: DC
  Filled 2017-03-01: qty 30

## 2017-03-01 MED ORDER — TRANEXAMIC ACID (OHS) BOLUS VIA INFUSION
15.0000 mg/kg | INTRAVENOUS | Status: AC
  Administered 2017-03-02: 1476 mg via INTRAVENOUS
  Filled 2017-03-01: qty 1476

## 2017-03-01 MED ORDER — TRAZODONE HCL 50 MG PO TABS
50.0000 mg | ORAL_TABLET | Freq: Every day | ORAL | Status: DC
  Administered 2017-03-01: 50 mg via ORAL
  Filled 2017-03-01: qty 1

## 2017-03-01 MED ORDER — SODIUM CHLORIDE 0.9% FLUSH: 3.0000 mL | INTRAVENOUS | Status: DC | PRN

## 2017-03-01 MED ORDER — MIDAZOLAM HCL 2 MG/2ML IJ SOLN
INTRAMUSCULAR | Status: AC
  Filled 2017-03-01: qty 2

## 2017-03-01 MED ORDER — SODIUM CHLORIDE 0.9 % IV SOLN
INTRAVENOUS | Status: AC
  Administered 2017-03-02: .5 [IU]/h via INTRAVENOUS
  Filled 2017-03-01: qty 1

## 2017-03-01 MED ORDER — DEXTROSE 5 % IV SOLN
0.0000 ug/min | INTRAVENOUS | Status: DC
  Filled 2017-03-01: qty 4

## 2017-03-01 MED ORDER — CHLORHEXIDINE GLUCONATE CLOTH 2 % EX PADS
6.0000 | MEDICATED_PAD | Freq: Once | CUTANEOUS | Status: AC
  Administered 2017-03-01: 6 via TOPICAL

## 2017-03-01 MED ORDER — ACETAMINOPHEN 325 MG PO TABS: 650.0000 mg | ORAL_TABLET | ORAL | Status: DC | PRN

## 2017-03-01 MED ORDER — METOPROLOL TARTRATE 12.5 MG HALF TABLET
12.5000 mg | ORAL_TABLET | Freq: Once | ORAL | Status: AC
  Administered 2017-03-02: 12.5 mg via ORAL
  Filled 2017-03-01: qty 1

## 2017-03-01 MED ORDER — NITROGLYCERIN IN D5W 200-5 MCG/ML-% IV SOLN
2.0000 ug/min | INTRAVENOUS | Status: AC
  Administered 2017-03-02: 5 ug/min via INTRAVENOUS
  Filled 2017-03-01: qty 250

## 2017-03-01 MED ORDER — ONDANSETRON HCL 4 MG/2ML IJ SOLN: 4.0000 mg | Freq: Four times a day (QID) | INTRAMUSCULAR | Status: DC | PRN

## 2017-03-01 MED ORDER — CALCITRIOL 0.5 MCG PO CAPS: 0.5000 ug | ORAL_CAPSULE | Freq: Every day | ORAL | Status: DC

## 2017-03-01 MED ORDER — VERAPAMIL HCL 2.5 MG/ML IV SOLN
INTRAVENOUS | Status: DC | PRN
  Administered 2017-03-01: 10 mL via INTRA_ARTERIAL

## 2017-03-01 MED ORDER — CHLORHEXIDINE GLUCONATE 0.12 % MT SOLN
15.0000 mL | Freq: Once | OROMUCOSAL | Status: AC
  Administered 2017-03-02: 15 mL via OROMUCOSAL
  Filled 2017-03-01: qty 15

## 2017-03-01 MED ORDER — PLASMA-LYTE 148 IV SOLN
INTRAVENOUS | Status: AC
  Administered 2017-03-02: 500 mL
  Filled 2017-03-01: qty 2.5

## 2017-03-01 MED ORDER — CALCIUM CARBONATE 1250 (500 CA) MG PO TABS
500.0000 mg | ORAL_TABLET | Freq: Two times a day (BID) | ORAL | Status: DC
  Administered 2017-03-01: 500 mg via ORAL
  Filled 2017-03-01: qty 1

## 2017-03-01 MED ORDER — INFLUENZA VAC SPLIT QUAD 0.5 ML IM SUSY: 0.5000 mL | PREFILLED_SYRINGE | INTRAMUSCULAR | Status: DC

## 2017-03-01 MED ORDER — LOSARTAN POTASSIUM 50 MG PO TABS
100.0000 mg | ORAL_TABLET | Freq: Every evening | ORAL | Status: DC
  Administered 2017-03-01: 100 mg via ORAL
  Filled 2017-03-01: qty 2

## 2017-03-01 MED ORDER — DEXTROSE 5 % IV SOLN
1.5000 g | INTRAVENOUS | Status: AC
  Administered 2017-03-02: .75 g via INTRAVENOUS
  Administered 2017-03-02: 1.5 g via INTRAVENOUS
  Filled 2017-03-01: qty 1.5

## 2017-03-01 MED ORDER — HEPARIN (PORCINE) IN NACL 2-0.9 UNIT/ML-% IJ SOLN
INTRAMUSCULAR | Status: AC
  Filled 2017-03-01: qty 1000

## 2017-03-01 MED ORDER — SODIUM CHLORIDE 0.9 % WEIGHT BASED INFUSION
3.0000 mL/kg/h | INTRAVENOUS | Status: DC
  Administered 2017-03-01: 3 mL/kg/h via INTRAVENOUS

## 2017-03-01 MED ORDER — MAGNESIUM SULFATE 50 % IJ SOLN
40.0000 meq | INTRAMUSCULAR | Status: DC
  Filled 2017-03-01: qty 10

## 2017-03-01 MED ORDER — SODIUM CHLORIDE 0.9 % IV SOLN: INTRAVENOUS | Status: AC

## 2017-03-01 MED ORDER — HEPARIN SODIUM (PORCINE) 1000 UNIT/ML IJ SOLN
INTRAMUSCULAR | Status: AC
  Filled 2017-03-01: qty 1

## 2017-03-01 MED ORDER — METOPROLOL TARTRATE 12.5 MG HALF TABLET
12.5000 mg | ORAL_TABLET | Freq: Two times a day (BID) | ORAL | Status: DC
  Administered 2017-03-01: 12.5 mg via ORAL
  Filled 2017-03-01: qty 1

## 2017-03-01 MED ORDER — FENTANYL CITRATE (PF) 100 MCG/2ML IJ SOLN
INTRAMUSCULAR | Status: AC
  Filled 2017-03-01: qty 2

## 2017-03-01 MED ORDER — EZETIMIBE 10 MG PO TABS
10.0000 mg | ORAL_TABLET | Freq: Every day | ORAL | Status: DC
  Administered 2017-03-01: 10 mg via ORAL
  Filled 2017-03-01: qty 1

## 2017-03-01 SURGICAL SUPPLY — 10 items
CATH 5FR JL3.5 JR4 ANG PIG MP (CATHETERS) ×1 IMPLANT
DEVICE RAD COMP TR BAND LRG (VASCULAR PRODUCTS) ×1 IMPLANT
GLIDESHEATH SLEND SS 6F .021 (SHEATH) ×1 IMPLANT
GUIDEWIRE INQWIRE 1.5J.035X260 (WIRE) IMPLANT
INQWIRE 1.5J .035X260CM (WIRE) ×2
KIT HEART LEFT (KITS) ×2 IMPLANT
PACK CARDIAC CATHETERIZATION (CUSTOM PROCEDURE TRAY) ×2 IMPLANT
SYR MEDRAD MARK V 150ML (SYRINGE) ×2 IMPLANT
TRANSDUCER W/STOPCOCK (MISCELLANEOUS) ×2 IMPLANT
TUBING CIL FLEX 10 FLL-RA (TUBING) ×2 IMPLANT

## 2017-03-01 NOTE — H&P (View-Only) (Signed)
Derrick [name] Date of Birth  05-14-1956 [facility] [address]     [address] [place], [place]  [address] [phone]  Fax  [phone]  Problem List: 1. CAD, status post stents to the LAD,  left circumflex artery 2006 . His chronically occluded right coronary artery 2. hyperlipidemia-intolerant to statins 3. Hypertension 4. Peripheral Vascular disease   History of Present Illness:  61 yo male with history of CAD - s/p stents to LAD and LCx.  Chronically occluded RCA.  No angina.  He eats a very fatty diet - lots of fried foods.  Not exercising.  He does lots of yard work and has never had any episodes of angina.  Oct 01, 2013:  Derrick [name] was seen by Derrick [name] in Oct. 2014 for pre-op eval prior to rotator cuff surgery.    His myoview study showed:  Low risk stress nuclear study with a large, severe intensity, partially reversible inferior defect consistent with prior inferior infarct and very mild peri-infarct ischemia.  LV Ejection Fraction: 54%. LV Wall Motion: Inferior hypokinesis.  He has shoulder surgery without difficulty.  He's doing well. He denies any chest pain or shortness of breath.  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     Feb. 12 2018:    Has done well.  Has hx of PVD - Fabienne Bruns)   Oct. 1 2018;    Derrick [name] has been having some DOE , chest pain with exertion Multiple occasions over the past month,,   Gradually getting worse   Occurred while he was pruning and cleaning up his yard.  Is exercising more.  Works 11 hours a day . 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  Symptoms feel very similar to his previous symptoms in 2006   Current Outpatient Prescriptions  Medication Sig Dispense Refill  . acetaminophen (TYLENOL ARTHRITIS PAIN) 650 MG CR tablet Take 1,300 mg by mouth 2 (two) times daily.     Marland Kitchen aspirin EC 81 MG tablet Take 1 tablet (81 mg total) by mouth daily.    . calcitRIOL (ROCALTROL) 0.5 MCG capsule Take  0.5 mcg by mouth daily.    . calcium-vitamin D (OSCAL WITH D) 500-200 MG-UNIT per tablet Take 2 tablets by mouth. Take 2 tabs Am and 1 tab Pm    . ezetimibe (ZETIA) 10 MG tablet Take 10 mg by mouth daily.      . fenofibrate 160 MG tablet Take 160 mg by mouth daily.    . fish oil-omega-3 fatty acids 1000 MG capsule Take 4,800 mg by mouth daily. Take 2 tabs. Am and 2 tabs Pm    . gabapentin (NEURONTIN) 300 MG capsule Take 300 mg by mouth daily.  6  . glucosamine-chondroitin 500-400 MG tablet Take 1 tablet by mouth 2 (two) times daily.     Marland Kitchen losartan (COZAAR) 100 MG tablet Take 100 mg by mouth daily.     . Multiple Vitamins-Minerals (MULTIVITAMIN WITH MINERALS) tablet Take 1 tablet by mouth daily.    . ranitidine (ZANTAC) 150 MG tablet Take 150 mg by mouth 2 (two) times daily.    . traZODone (DESYREL) 50 MG tablet Take 50 mg by mouth at bedtime.    . nitroGLYCERIN (NITROSTAT) 0.4 MG SL tablet Place 1 tablet (0.4 mg total) under the tongue every 5 (five) minutes as needed for chest pain. 25 tablet 12   No current facility-administered medications for this visit.  Allergies  Allergen Reactions  . Atorvastatin     Muscle aches  . Codeine Itching  . Crestor [Rosuvastatin Calcium]     Muscle aches  . Meloxicam Other (See Comments)    Can not take due to kidneys  . Statins Other (See Comments)    myalgia    Past Medical History:  Diagnosis Date  . Coronary artery disease 2006   stents x2=LAD LCX; totally occluded RCA  . Dyslipidemia   . GERD (gastroesophageal reflux disease)   . History of bronchitis   . Hyperlipidemia    statin intolerant  . Hyperparathyroidism   . Hypertension   . Insomnia   . Metatarsalgia   . OA (osteoarthritis)   . Peripheral vascular disease (HCC)   . Seasonal allergies   . Urinary frequency   . Wears glasses     Past Surgical History:  Procedure Laterality Date  . CARDIAC CATHETERIZATION  10/28/2004   circ and mid LAD chyper stents  . CERVICAL  FUSION  [id number]  . CERVICAL SPINE SURGERY  2004  . COLONOSCOPY    . CORONARY ANGIOPLASTY  2006   stents placed   . ELBOW ARTHROPLASTY  1994   right  . ENDARTERECTOMY FEMORAL Left 03/17/2015   Procedure: ENDARTERECTOMY COMMON FEMORAL WITH PROFUNDOPLASTY;  Surgeon: Sherren Kerns, MD;  Location: [facility] OR;  Service: Vascular;  Laterality: Left;  . FEMORAL-POPLITEAL BYPASS GRAFT Left 03/17/2015   Procedure: BYPASS GRAFT FEMORAL-POPLITEAL ARTERY-LEFT LEG AND POSTERIOR TIBIAL SEQUENTIAL GRAFT TO BELOW KNEE POPLITEAL ARTERY;  Surgeon: Sherren Kerns, MD;  Location: [facility] OR;  Service: Vascular;  Laterality: Left;  . FEMORAL-POPLITEAL BYPASS GRAFT Right 05/11/2015   Procedure:  RIGHT FEMORAL-BELOW THE KNEE POPLITEAL ARTERY BYPASS GRAFT USING NON REVERSE RIGHT GREATER SAPHENOUS VEIN;  Surgeon: Sherren Kerns, MD;  Location: [facility] OR;  Service: Vascular;  Laterality: Right;  . INCISION AND DRAINAGE ABSCESS POSTERIOR CERVICALSPINE  2009  . INTRAOPERATIVE ARTERIOGRAM Left 03/17/2015   Procedure: INTRA OPERATIVE ARTERIOGRAM X2;  Surgeon: Sherren Kerns, MD;  Location: [facility] OR;  Service: Vascular;  Laterality: Left;  . INTRAOPERATIVE ARTERIOGRAM Right 05/11/2015   Procedure: INTRA OPERATIVE ARTERIOGRAM;  Surgeon: Sherren Kerns, MD;  Location: [facility] OR;  Service: Vascular;  Laterality: Right;  . PATCH ANGIOPLASTY Left 03/17/2015   Procedure: VEIN PATCH ANGIOPLASTY COMMON FEMORAL ARTERY;  Surgeon: Sherren Kerns, MD;  Location: [facility] OR;  Service: Vascular;  Laterality: Left;  . PERIPHERAL VASCULAR CATHETERIZATION N/A 01/16/2015   Procedure: Abdominal Aortogram;  Surgeon: Sherren Kerns, MD;  Location: [facility] INVASIVE CV LAB;  Service: Cardiovascular;  Laterality: N/A;  . SHOULDER OPEN ROTATOR CUFF REPAIR Right 04/23/2013   Procedure: RIGHT TWO TENDON ROTATOR CUFF REPAIR  SUBACROMIAL DECOMPRESSION AND OPEN DISTAL CLAVICLE RESECTION;  Surgeon: Wyn Forster., MD;  Location: [facility];  Service:  Orthopedics;  Laterality: Right;  . stents placed   2006  . VEIN HARVEST Right 05/11/2015   Procedure: WITH NON REVERSE RIGHT GREATER SAPHENOUS VEIN HARVEST;  Surgeon: Sherren Kerns, MD;  Location: [facility] OR;  Service: Vascular;  Laterality: Right;    History  Smoking Status  . Former Smoker  . Types: Cigarettes  . Quit date: 05/30/1989  Smokeless Tobacco  . Never Used    History  Alcohol Use  . 0.0 oz/week    Comment: one drink a month    Family History  Problem Relation Age of Onset  . Arthritis Mother   . Varicose Veins  Mother   . Lung cancer Father   . COPD Father        Lung  . Cancer Brother        Lukemia  . COPD Brother   . Cancer Brother        Prostate    Reviw of Systems:  Reviewed in the HPI.  All other systems are negative.  Physical Exam: Blood pressure (!) 158/76, pulse (!) 59, height  (1.88 m), weight 217 lb 12.8 oz (98.8 kg), SpO2 96 %.  GEN:  Well nourished, well developed in no acute distress HEENT: Normal NECK: No JVD; No carotid bruits LYMPHATICS: No lymphadenopathy CARDIAC: RR, no murmurs, rubs, gallops RESPIRATORY:  Clear to auscultation without rales, wheezing or rhonchi  ABDOMEN: Soft, non-tender, non-distended MUSCULOSKELETAL:  No edema; No deformity  SKIN: Warm and dry NEUROLOGIC:  Alert and oriented x 3   ECG: Oct. 1, 2018:   Sinus brady at 59.  Inc. RBBB. NS ST abn.  Small Q wave in aVL with minimal ST elevatein - no significant changes from previous   Assessment / Plan:    1. CAD -  Derrick [name] presents today as a work in visit for episodes of exertional angina. He has previous stenting in 2006. He now presents with chest tightness and headache related to exertion. The symptoms are very similar to his previous episodes of angina in 2006. He's had them now for a month and they seem to be progressing. We will refill his nitroglycerin. We will schedule him for a heart catheterization. We have discussed the risks, benefits, and  options of heart cath. He understands and agrees to proceed  2. PVD -  Stable      Kristeen Miss, MD  02/27/2017 3:36 PM    [facility] [address] [place],  [address] [place], [place]  [address] Pager [phone] Phone: [phone]; Fax: [phone]

## 2017-03-01 NOTE — Interval H&P Note (Signed)
History and Physical Interval Note:  03/01/2017 2:25 PM  Derrick [name]  has presented today for cardiac cath with the diagnosis of unstable angina  The various methods of treatment have been discussed with the patient and family. After consideration of risks, benefits and other options for treatment, the patient has consented to  Procedure(s): LEFT HEART CATH AND CORONARY ANGIOGRAPHY (N/A) as a surgical intervention .  The patient's history has been reviewed, patient examined, no change in status, stable for surgery.  I have reviewed the patient's chart and labs.  Questions were answered to the patient's satisfaction.    Cath Lab Visit (complete for each Cath Lab visit)  Clinical Evaluation Leading to the Procedure:   ACS: No.  Non-ACS:    Anginal Classification: CCS III  Anti-ischemic medical therapy: No Therapy  Non-Invasive Test Results: No non-invasive testing performed  Prior CABG: No previous CABG         [name]

## 2017-03-01 NOTE — Progress Notes (Signed)
Removal of TR band. Right radial stable, no bruising or hematoma present. Sterile 2x2 gauze appled to insertion site with small tegaderm to secure. Patient given post care instruction and was able to repeat instructions. Dressing dry and intact

## 2017-03-01 NOTE — Progress Notes (Unsigned)
pre

## 2017-03-01 NOTE — Progress Notes (Signed)
Pre-op Cardiac Surgery  Carotid Findings:  Right 1-39% ICA stenosis, upper end of scale. Right subclavian >50% stenosis. Atypical waveform in right vertebral suggestive of proximal obstruction. Left 40-59% ICA stenosis. Antegrade left vertebral flow.   Upper Extremity Right Left  Brachial Pressures Not obtained due to recent radial cath 157  Radial Waveforms Tri Tri  Ulnar Waveforms Tri Tri  Palmar Arch (Allen's Test) Not obtained due to recent radial cath Normal     ABI within normal limits. Lower  Extremity Right Left      Anterior Tibial 165, Tri 192, Tri  Posterior Tibial 179, Tri 172, Tri  Ankle/Brachial Indices 1.14 1.22    Derrick [name], RDMS, RVT

## 2017-03-01 NOTE — Consult Note (Signed)
Reason for Consult:Left main disease Referring Physician: Dr. Clifton James Nahser  Derrick [name] is an 61 y.o. male.  HPI: 61 yo man with a past history of CAD (2 stents in 2006), PAD(bilateral fem-pop), hyperlipidemia, hypertension, remote tobacco abuse, osteoarthritis, hyperparathyroidism, multiple previous neck surgeries.  He presents with a 4 week Hx of chest discomfort with exertion. Says it feels like his chest is being pulled from both sides. relieved with rest. On Saturday had a prolonged episode and decided to see a physician.  Today he had cardiac catheterization. It revealed a chronically totally occluded RCA and a new 70% ostial left main stenosis. Both stents patent. EF normal  Past Medical History:  Diagnosis Date  . Coronary artery disease 2006   stents x2=LAD LCX; totally occluded RCA  . Dyslipidemia   . GERD (gastroesophageal reflux disease)   . History of bronchitis   . Hyperlipidemia    statin intolerant  . Hyperparathyroidism   . Hypertension   . Insomnia   . Metatarsalgia   . OA (osteoarthritis)   . Peripheral vascular disease (HCC)   . Seasonal allergies   . Urinary frequency   . Wears glasses     Past Surgical History:  Procedure Laterality Date  . CARDIAC CATHETERIZATION  10/28/2004   circ and mid LAD chyper stents  . CERVICAL FUSION  [id number]  . CERVICAL SPINE SURGERY  2004  . COLONOSCOPY    . CORONARY ANGIOPLASTY  2006   stents placed   . ELBOW ARTHROPLASTY  1994   right  . ENDARTERECTOMY FEMORAL Left 03/17/2015   Procedure: ENDARTERECTOMY COMMON FEMORAL WITH PROFUNDOPLASTY;  Surgeon: Sherren Kerns, MD;  Location: [facility] OR;  Service: Vascular;  Laterality: Left;  . FEMORAL-POPLITEAL BYPASS GRAFT Left 03/17/2015   Procedure: BYPASS GRAFT FEMORAL-POPLITEAL ARTERY-LEFT LEG AND POSTERIOR TIBIAL SEQUENTIAL GRAFT TO BELOW KNEE POPLITEAL ARTERY;  Surgeon: Sherren Kerns, MD;  Location: [facility] OR;  Service: Vascular;  Laterality: Left;  . FEMORAL-POPLITEAL  BYPASS GRAFT Right 05/11/2015   Procedure:  RIGHT FEMORAL-BELOW THE KNEE POPLITEAL ARTERY BYPASS GRAFT USING NON REVERSE RIGHT GREATER SAPHENOUS VEIN;  Surgeon: Sherren Kerns, MD;  Location: [facility] OR;  Service: Vascular;  Laterality: Right;  . INCISION AND DRAINAGE ABSCESS POSTERIOR CERVICALSPINE  2009  . INTRAOPERATIVE ARTERIOGRAM Left 03/17/2015   Procedure: INTRA OPERATIVE ARTERIOGRAM X2;  Surgeon: Sherren Kerns, MD;  Location: [facility] OR;  Service: Vascular;  Laterality: Left;  . INTRAOPERATIVE ARTERIOGRAM Right 05/11/2015   Procedure: INTRA OPERATIVE ARTERIOGRAM;  Surgeon: Sherren Kerns, MD;  Location: [facility] OR;  Service: Vascular;  Laterality: Right;  . PATCH ANGIOPLASTY Left 03/17/2015   Procedure: VEIN PATCH ANGIOPLASTY COMMON FEMORAL ARTERY;  Surgeon: Sherren Kerns, MD;  Location: [facility] OR;  Service: Vascular;  Laterality: Left;  . PERIPHERAL VASCULAR CATHETERIZATION N/A 01/16/2015   Procedure: Abdominal Aortogram;  Surgeon: Sherren Kerns, MD;  Location: [facility] INVASIVE CV LAB;  Service: Cardiovascular;  Laterality: N/A;  . SHOULDER OPEN ROTATOR CUFF REPAIR Right 04/23/2013   Procedure: RIGHT TWO TENDON ROTATOR CUFF REPAIR  SUBACROMIAL DECOMPRESSION AND OPEN DISTAL CLAVICLE RESECTION;  Surgeon: Wyn Forster., MD;  Location: [facility];  Service: Orthopedics;  Laterality: Right;  . stents placed   2006  . VEIN HARVEST Right 05/11/2015   Procedure: WITH NON REVERSE RIGHT GREATER SAPHENOUS VEIN HARVEST;  Surgeon: Sherren Kerns, MD;  Location: [facility] OR;  Service: Vascular;  Laterality: Right;    Family History  Problem Relation Age  of Onset  . Arthritis Mother   . Varicose Veins Mother   . Lung cancer Father   . COPD Father        Lung  . Cancer Brother        Lukemia  . COPD Brother   . Cancer Brother        Prostate    Social History:  reports that he quit smoking about 27 years ago. His smoking use included Cigarettes. He has never used smokeless tobacco. He  reports that he drinks alcohol. He reports that he does not use drugs.  Allergies:  Allergies  Allergen Reactions  . Atorvastatin     Muscle aches  . Codeine Itching  . Crestor [Rosuvastatin Calcium]     Muscle aches  . Meloxicam Other (See Comments)    Can not take due to kidneys  . Statins Other (See Comments)    myalgia    Medications:  Scheduled: . [START ON 03/02/2017] aspirin EC  81 mg Oral Daily  . [START ON 03/02/2017] calcitRIOL  0.5 mcg Oral Daily  . calcium carbonate  500 mg of elemental calcium Oral BID WC  . ezetimibe  10 mg Oral Daily  . fenofibrate  160 mg Oral Daily  . gabapentin  300 mg Oral QPM  . [START ON 03/02/2017] Influenza vac split quadrivalent PF  0.5 mL Intramuscular Tomorrow-1000  . losartan  100 mg Oral QPM  . metoprolol tartrate  12.5 mg Oral BID  . sodium chloride flush  3 mL Intravenous Q12H  . traZODone  50 mg Oral QHS  . [START ON 03/02/2017] vitamin B-12  2,500 mcg Oral Daily    No results found for this or any previous visit (from the past 48 hour(s)).  No results found.  Review of Systems  Constitutional: Positive for malaise/fatigue. Negative for fever.  Eyes: Negative for blurred vision and double vision.  Respiratory: Positive for shortness of breath. Negative for cough and wheezing.   Cardiovascular: Positive for chest pain and leg swelling. Negative for claudication.  Gastrointestinal: Negative for nausea and vomiting.  Genitourinary: Negative for dysuria and urgency.  Musculoskeletal: Positive for joint pain and neck pain.  Neurological: Negative for focal weakness and loss of consciousness.  Endo/Heme/Allergies: Does not bruise/bleed easily.  All other systems reviewed and are negative.  Blood pressure (!) 167/75, pulse 61, temperature 98.4 F (36.9 C), temperature source Oral, resp. rate 20, height  (1.88 m), weight 217 lb (98.4 kg), SpO2 98 %. Physical Exam  Vitals reviewed. Constitutional: He is oriented to person,  place, and time. He appears well-developed and well-nourished. No distress.  HENT:  Head: Normocephalic and atraumatic.  Mouth/Throat: No oropharyngeal exudate.  Eyes: Conjunctivae and EOM are normal. No scleral icterus.  Neck: Neck supple. No thyromegaly present.  Right carotid bruit  Cardiovascular: Normal rate, regular rhythm and normal heart sounds.  Exam reveals no gallop and no friction rub.   No murmur heard. Respiratory: Effort normal and breath sounds normal. No respiratory distress. He has no wheezes. He has no rales.  Hypertrophy of right [place] joint, nontender  GI: Soft. He exhibits no distension. There is no tenderness.  Musculoskeletal: He exhibits edema.  Lymphadenopathy:    He has no cervical adenopathy.  Neurological: He is alert and oriented to person, place, and time. No cranial nerve deficit. He exhibits normal muscle tone. Coordination normal.  Skin: Skin is warm and dry.  Well healed surgical scars both LE   CARDIAC CATHETERIZATION  Conclusion     Ost RCA to Prox RCA lesion, 100 %stenosed.  Ost LM lesion, 70 %stenosed.  Mid Cx lesion, 10 %stenosed.  Prox Cx lesion, 50 %stenosed.  Mid LAD lesion, 10 %stenosed.  The left ventricular systolic function is normal.  LV end diastolic pressure is normal.  The left ventricular ejection fraction is greater than 65% by visual estimate.  There is no mitral valve regurgitation.   1. Severe triple vessel CAD 2. Severe ostial left main stenosis with dampening of pressures with catheter engagement.  3. Patent stent mid LAD 4. Patent stent mid Circumflex with moderate disease in the proximal Circumflex prior to the stent.  5. Chronic occlusion RCA with filling of distal branches by left to right collaterals.  6. Normal LV systolic function  Recommendations: He has high grade stenosis of the ostial left main artery and filling of the RCA from left to right collaterals. CABG is the best method for revascularization  given his anatomy. Will consult CT surgery.     I personally reviewed the cath images and concur with the findings noted above  Assessment/Plan: 61 yo man with known CAD who presents with new onset class III angina. Cath showed left main disease in setting of a totally occluded RCA. CABG is indicated for survival benefit and relief of symptoms.  I discussed the general nature of the procedure, including the need for general anesthesia, the use of cardiopulmonary bypass and the incisions to be used with Derrick [name] and his family. We discussed the expected hospital stay, overall recovery and short and long term outcomes. I informed them of the indications, risks, benefits and alternatives. They understand the risks include, but are not limited to death, stroke, MI, DVT/PE, bleeding, possible need for transfusion, infections, cardiac arrhythmias, as well as other organ system dysfunction including respiratory, renal, or GI complications.   He accepts the risks and agrees to proceed.  For CABG in AM  Derrick [name] 03/01/2017, 5:45 PM

## 2017-03-02 ENCOUNTER — Inpatient Hospital Stay (HOSPITAL_COMMUNITY): Payer: BLUE CROSS/BLUE SHIELD | Admitting: Certified Registered Nurse Anesthetist

## 2017-03-02 ENCOUNTER — Inpatient Hospital Stay (HOSPITAL_COMMUNITY): Payer: BLUE CROSS/BLUE SHIELD

## 2017-03-02 ENCOUNTER — Other Ambulatory Visit: Payer: Self-pay

## 2017-03-02 ENCOUNTER — Encounter (HOSPITAL_COMMUNITY): Payer: Self-pay | Admitting: *Deleted

## 2017-03-02 ENCOUNTER — Inpatient Hospital Stay (HOSPITAL_COMMUNITY)
Admission: AD | Disposition: A | Discharge status: Home / Self Care | Payer: Self-pay | Source: Ambulatory Visit | Attending: Thoracic Surgery (Cardiothoracic Vascular Surgery)

## 2017-03-02 DIAGNOSIS — Z951 Presence of aortocoronary bypass graft: Secondary | ICD-10-CM

## 2017-03-02 DIAGNOSIS — I200 Unstable angina: Secondary | ICD-10-CM

## 2017-03-02 HISTORY — PX: TEE WITHOUT CARDIOVERSION: SHX5443

## 2017-03-02 HISTORY — PX: CORONARY ARTERY BYPASS GRAFT: SHX141

## 2017-03-02 LAB — POCT I-STAT, CHEM 8
BUN: 22 mg/dL — ABNORMAL HIGH (ref 6–20)
BUN: 21 mg/dL — ABNORMAL HIGH (ref 6–20)
BUN: 20 mg/dL (ref 6–20)
BUN: 21 mg/dL — ABNORMAL HIGH (ref 6–20)
BUN: 22 mg/dL — ABNORMAL HIGH (ref 6–20)
BUN: 21 mg/dL — ABNORMAL HIGH (ref 6–20)
BUN: 20 mg/dL (ref 6–20)
Calcium, Ion: 1.11 mmol/L — ABNORMAL LOW (ref 1.15–1.40)
Calcium, Ion: 1.06 mmol/L — ABNORMAL LOW (ref 1.15–1.40)
Calcium, Ion: 0.94 mmol/L — ABNORMAL LOW (ref 1.15–1.40)
Calcium, Ion: 0.97 mmol/L — ABNORMAL LOW (ref 1.15–1.40)
Calcium, Ion: 0.98 mmol/L — ABNORMAL LOW (ref 1.15–1.40)
Calcium, Ion: 0.97 mmol/L — ABNORMAL LOW (ref 1.15–1.40)
Calcium, Ion: 1.07 mmol/L — ABNORMAL LOW (ref 1.15–1.40)
Chloride: 104 mmol/L (ref 101–111)
Chloride: 104 mmol/L (ref 101–111)
Chloride: 101 mmol/L (ref 101–111)
Chloride: 99 mmol/L — ABNORMAL LOW (ref 101–111)
Chloride: 102 mmol/L (ref 101–111)
Chloride: 103 mmol/L (ref 101–111)
Chloride: 103 mmol/L (ref 101–111)
Creatinine, Ser: 0.9 mg/dL (ref 0.61–1.24)
Creatinine, Ser: 1 mg/dL (ref 0.61–1.24)
Creatinine, Ser: 0.9 mg/dL (ref 0.61–1.24)
Creatinine, Ser: 1 mg/dL (ref 0.61–1.24)
Creatinine, Ser: 1 mg/dL (ref 0.61–1.24)
Creatinine, Ser: 1 mg/dL (ref 0.61–1.24)
Creatinine, Ser: 1.1 mg/dL (ref 0.61–1.24)
Glucose, Bld: 87 mg/dL (ref 65–99)
Glucose, Bld: 110 mg/dL — ABNORMAL HIGH (ref 65–99)
Glucose, Bld: 109 mg/dL — ABNORMAL HIGH (ref 65–99)
Glucose, Bld: 131 mg/dL — ABNORMAL HIGH (ref 65–99)
Glucose, Bld: 123 mg/dL — ABNORMAL HIGH (ref 65–99)
Glucose, Bld: 122 mg/dL — ABNORMAL HIGH (ref 65–99)
Glucose, Bld: 129 mg/dL — ABNORMAL HIGH (ref 65–99)
HCT: 36 % — ABNORMAL LOW (ref 39.0–52.0)
HCT: 30 % — ABNORMAL LOW (ref 39.0–52.0)
HCT: 27 % — ABNORMAL LOW (ref 39.0–52.0)
HCT: 28 % — ABNORMAL LOW (ref 39.0–52.0)
HCT: 27 % — ABNORMAL LOW (ref 39.0–52.0)
HCT: 29 % — ABNORMAL LOW (ref 39.0–52.0)
HCT: 31 % — ABNORMAL LOW (ref 39.0–52.0)
Hemoglobin: 12.2 g/dL — ABNORMAL LOW (ref 13.0–17.0)
Hemoglobin: 10.2 g/dL — ABNORMAL LOW (ref 13.0–17.0)
Hemoglobin: 9.2 g/dL — ABNORMAL LOW (ref 13.0–17.0)
Hemoglobin: 9.5 g/dL — ABNORMAL LOW (ref 13.0–17.0)
Hemoglobin: 9.2 g/dL — ABNORMAL LOW (ref 13.0–17.0)
Hemoglobin: 9.9 g/dL — ABNORMAL LOW (ref 13.0–17.0)
Hemoglobin: 10.5 g/dL — ABNORMAL LOW (ref 13.0–17.0)
Potassium: 3.6 mmol/L (ref 3.5–5.1)
Potassium: 4 mmol/L (ref 3.5–5.1)
Potassium: 3.9 mmol/L (ref 3.5–5.1)
Potassium: 4.1 mmol/L (ref 3.5–5.1)
Potassium: 4.3 mmol/L (ref 3.5–5.1)
Potassium: 3.7 mmol/L (ref 3.5–5.1)
Potassium: 4 mmol/L (ref 3.5–5.1)
Sodium: 140 mmol/L (ref 135–145)
Sodium: 139 mmol/L (ref 135–145)
Sodium: 137 mmol/L (ref 135–145)
Sodium: 138 mmol/L (ref 135–145)
Sodium: 138 mmol/L (ref 135–145)
Sodium: 139 mmol/L (ref 135–145)
Sodium: 136 mmol/L (ref 135–145)
TCO2: 28 mmol/L (ref 22–32)
TCO2: 26 mmol/L (ref 22–32)
TCO2: 25 mmol/L (ref 22–32)
TCO2: 28 mmol/L (ref 22–32)
TCO2: 27 mmol/L (ref 22–32)
TCO2: 25 mmol/L (ref 22–32)
TCO2: 24 mmol/L (ref 22–32)

## 2017-03-02 LAB — POCT I-STAT 3, ART BLOOD GAS (G3+)
Acid-Base Excess: 1 mmol/L (ref 0.0–2.0)
Acid-Base Excess: 1 mmol/L (ref 0.0–2.0)
Acid-base deficit: 2 mmol/L (ref 0.0–2.0)
Acid-base deficit: 3 mmol/L — ABNORMAL HIGH (ref 0.0–2.0)
Acid-base deficit: 4 mmol/L — ABNORMAL HIGH (ref 0.0–2.0)
Acid-base deficit: 3 mmol/L — ABNORMAL HIGH (ref 0.0–2.0)
Bicarbonate: 25.6 mmol/L (ref 20.0–28.0)
Bicarbonate: 25.9 mmol/L (ref 20.0–28.0)
Bicarbonate: 24.7 mmol/L (ref 20.0–28.0)
Bicarbonate: 23.6 mmol/L (ref 20.0–28.0)
Bicarbonate: 23.7 mmol/L (ref 20.0–28.0)
Bicarbonate: 23.4 mmol/L (ref 20.0–28.0)
O2 Saturation: 100 %
O2 Saturation: 100 %
O2 Saturation: 99 %
O2 Saturation: 94 %
O2 Saturation: 93 %
O2 Saturation: 99 %
Patient temperature: 36.8
Patient temperature: 37.2
Patient temperature: 37.3
Patient temperature: 37.4
TCO2: 27 mmol/L (ref 22–32)
TCO2: 27 mmol/L (ref 22–32)
TCO2: 26 mmol/L (ref 22–32)
TCO2: 25 mmol/L (ref 22–32)
TCO2: 25 mmol/L (ref 22–32)
TCO2: 25 mmol/L (ref 22–32)
pCO2 arterial: 41.8 mmHg (ref 32.0–48.0)
pCO2 arterial: 42.2 mmHg (ref 32.0–48.0)
pCO2 arterial: 47.3 mmHg (ref 32.0–48.0)
pCO2 arterial: 49.2 mmHg — ABNORMAL HIGH (ref 32.0–48.0)
pCO2 arterial: 53.3 mmHg — ABNORMAL HIGH (ref 32.0–48.0)
pCO2 arterial: 49.5 mmHg — ABNORMAL HIGH (ref 32.0–48.0)
pH, Arterial: 7.395 (ref 7.350–7.450)
pH, Arterial: 7.397 (ref 7.350–7.450)
pH, Arterial: 7.324 — ABNORMAL LOW (ref 7.350–7.450)
pH, Arterial: 7.291 — ABNORMAL LOW (ref 7.350–7.450)
pH, Arterial: 7.259 — ABNORMAL LOW (ref 7.350–7.450)
pH, Arterial: 7.285 — ABNORMAL LOW (ref 7.350–7.450)
pO2, Arterial: 356 mmHg — ABNORMAL HIGH (ref 83.0–108.0)
pO2, Arterial: 204 mmHg — ABNORMAL HIGH (ref 83.0–108.0)
pO2, Arterial: 131 mmHg — ABNORMAL HIGH (ref 83.0–108.0)
pO2, Arterial: 81 mmHg — ABNORMAL LOW (ref 83.0–108.0)
pO2, Arterial: 79 mmHg — ABNORMAL LOW (ref 83.0–108.0)
pO2, Arterial: 153 mmHg — ABNORMAL HIGH (ref 83.0–108.0)

## 2017-03-02 LAB — CBC
HCT: 40.5 % (ref 39.0–52.0)
HCT: 29.2 % — ABNORMAL LOW (ref 39.0–52.0)
HCT: 32.4 % — ABNORMAL LOW (ref 39.0–52.0)
Hemoglobin: 13.5 g/dL (ref 13.0–17.0)
Hemoglobin: 9.8 g/dL — ABNORMAL LOW (ref 13.0–17.0)
Hemoglobin: 10.5 g/dL — ABNORMAL LOW (ref 13.0–17.0)
MCH: 29.8 pg (ref 26.0–34.0)
MCH: 29.7 pg (ref 26.0–34.0)
MCH: 29 pg (ref 26.0–34.0)
MCHC: 33.3 g/dL (ref 30.0–36.0)
MCHC: 33.6 g/dL (ref 30.0–36.0)
MCHC: 32.4 g/dL (ref 30.0–36.0)
MCV: 89.4 fL (ref 78.0–100.0)
MCV: 88.5 fL (ref 78.0–100.0)
MCV: 89.5 fL (ref 78.0–100.0)
Platelets: 361 10*3/uL (ref 150–400)
Platelets: 211 10*3/uL (ref 150–400)
Platelets: 261 10*3/uL (ref 150–400)
RBC: 4.53 MIL/uL (ref 4.22–5.81)
RBC: 3.3 MIL/uL — ABNORMAL LOW (ref 4.22–5.81)
RBC: 3.62 MIL/uL — ABNORMAL LOW (ref 4.22–5.81)
RDW: 13.3 % (ref 11.5–15.5)
RDW: 13.4 % (ref 11.5–15.5)
RDW: 13.2 % (ref 11.5–15.5)
WBC: 9.6 10*3/uL (ref 4.0–10.5)
WBC: 13.8 10*3/uL — ABNORMAL HIGH (ref 4.0–10.5)
WBC: 16.9 10*3/uL — ABNORMAL HIGH (ref 4.0–10.5)

## 2017-03-02 LAB — URINALYSIS, ROUTINE W REFLEX MICROSCOPIC
Bacteria, UA: NONE SEEN
Bilirubin Urine: NEGATIVE
Glucose, UA: NEGATIVE mg/dL
Ketones, ur: NEGATIVE mg/dL
Leukocytes, UA: NEGATIVE
Nitrite: NEGATIVE
Protein, ur: 30 mg/dL — AB
Specific Gravity, Urine: 1.01 (ref 1.005–1.030)
Squamous Epithelial / LPF: NONE SEEN
pH: 7 (ref 5.0–8.0)

## 2017-03-02 LAB — VAS US DOPPLER PRE CABG
LEFT ECA DIAS: -13 cm/s
LEFT VERTEBRAL DIAS: 17 cm/s
Left CCA dist dias: 10 cm/s
Left CCA dist sys: 106 cm/s
Left CCA prox dias: 15 cm/s
Left CCA prox sys: 146 cm/s
Left ICA dist dias: -29 cm/s
Left ICA dist sys: -134 cm/s
Left ICA prox dias: 36 cm/s
Left ICA prox sys: 155 cm/s
RIGHT ECA DIAS: -7 cm/s
RIGHT VERTEBRAL DIAS: 12 cm/s
Right CCA prox dias: 17 cm/s
Right CCA prox sys: 130 cm/s
Right cca dist sys: -101 cm/s

## 2017-03-02 LAB — BASIC METABOLIC PANEL
Anion gap: 11 (ref 5–15)
BUN: 19 mg/dL (ref 6–20)
CO2: 25 mmol/L (ref 22–32)
Calcium: 8.6 mg/dL — ABNORMAL LOW (ref 8.9–10.3)
Chloride: 101 mmol/L (ref 101–111)
Creatinine, Ser: 1.14 mg/dL (ref 0.61–1.24)
GFR calc Af Amer: >60 mL/min (ref >60)
GFR calc non Af Amer: >60 mL/min (ref >60)
Glucose, Bld: 85 mg/dL (ref 65–99)
Potassium: 3.7 mmol/L (ref 3.5–5.1)
Sodium: 137 mmol/L (ref 135–145)

## 2017-03-02 LAB — MAGNESIUM: Magnesium: 2.4 mg/dL (ref 1.7–2.4)

## 2017-03-02 LAB — COMPREHENSIVE METABOLIC PANEL
ALT: 14 U/L — ABNORMAL LOW (ref 17–63)
AST: 18 U/L (ref 15–41)
Albumin: 4.1 g/dL (ref 3.5–5.0)
Alkaline Phosphatase: 29 U/L — ABNORMAL LOW (ref 38–126)
Anion gap: 11 (ref 5–15)
BUN: 21 mg/dL — ABNORMAL HIGH (ref 6–20)
CO2: 25 mmol/L (ref 22–32)
Calcium: 8.9 mg/dL (ref 8.9–10.3)
Chloride: 103 mmol/L (ref 101–111)
Creatinine, Ser: 1.26 mg/dL — ABNORMAL HIGH (ref 0.61–1.24)
GFR calc Af Amer: >60 mL/min (ref >60)
GFR calc non Af Amer: 59 mL/min — ABNORMAL LOW (ref >60)
Glucose, Bld: 96 mg/dL (ref 65–99)
Potassium: 3.9 mmol/L (ref 3.5–5.1)
Sodium: 139 mmol/L (ref 135–145)
Total Bilirubin: 0.6 mg/dL (ref 0.3–1.2)
Total Protein: 7.1 g/dL (ref 6.5–8.1)

## 2017-03-02 LAB — CREATININE, SERUM
Creatinine, Ser: 1.16 mg/dL (ref 0.61–1.24)
GFR calc Af Amer: >60 mL/min (ref >60)
GFR calc non Af Amer: >60 mL/min (ref >60)

## 2017-03-02 LAB — GLUCOSE, CAPILLARY
Glucose-Capillary: 71 mg/dL (ref 65–99)
Glucose-Capillary: 112 mg/dL — ABNORMAL HIGH (ref 65–99)
Glucose-Capillary: 123 mg/dL — ABNORMAL HIGH (ref 65–99)
Glucose-Capillary: 124 mg/dL — ABNORMAL HIGH (ref 65–99)
Glucose-Capillary: 129 mg/dL — ABNORMAL HIGH (ref 65–99)
Glucose-Capillary: 133 mg/dL — ABNORMAL HIGH (ref 65–99)
Glucose-Capillary: 123 mg/dL — ABNORMAL HIGH (ref 65–99)
Glucose-Capillary: 155 mg/dL — ABNORMAL HIGH (ref 65–99)
Glucose-Capillary: 120 mg/dL — ABNORMAL HIGH (ref 65–99)

## 2017-03-02 LAB — POCT I-STAT 4, (NA,K, GLUC, HGB,HCT)
Glucose, Bld: 92 mg/dL (ref 65–99)
HCT: 29 % — ABNORMAL LOW (ref 39.0–52.0)
Hemoglobin: 9.9 g/dL — ABNORMAL LOW (ref 13.0–17.0)
Potassium: 3.6 mmol/L (ref 3.5–5.1)
Sodium: 142 mmol/L (ref 135–145)

## 2017-03-02 LAB — SURGICAL PCR SCREEN
MRSA, PCR: NEGATIVE
Staphylococcus aureus: POSITIVE — AB

## 2017-03-02 LAB — PLATELET COUNT: Platelets: 222 10*3/uL (ref 150–400)

## 2017-03-02 LAB — APTT: aPTT: 26 seconds (ref 24–36)

## 2017-03-02 LAB — PROTIME-INR
INR: 1.32
Prothrombin Time: 16.2 seconds — ABNORMAL HIGH (ref 11.4–15.2)

## 2017-03-02 LAB — PREPARE RBC (CROSSMATCH)

## 2017-03-02 SURGERY — CORONARY ARTERY BYPASS GRAFTING (CABG)
Anesthesia: General | Site: Chest

## 2017-03-02 MED ORDER — LACTATED RINGERS IV SOLN
INTRAVENOUS | Status: DC | PRN
  Administered 2017-03-02: 07:00:00 via INTRAVENOUS

## 2017-03-02 MED ORDER — OXYCODONE HCL 5 MG PO TABS
5.0000 mg | ORAL_TABLET | ORAL | Status: DC | PRN
  Administered 2017-03-03 (×2): 10 mg via ORAL
  Administered 2017-03-04: 5 mg via ORAL
  Administered 2017-03-04: 10 mg via ORAL
  Administered 2017-03-05: 5 mg via ORAL
  Filled 2017-03-02 (×2): qty 1
  Filled 2017-03-02 (×3): qty 2

## 2017-03-02 MED ORDER — SODIUM CHLORIDE 0.9 % IV SOLN
0.0000 ug/min | INTRAVENOUS | Status: DC
  Filled 2017-03-02: qty 2

## 2017-03-02 MED ORDER — LIDOCAINE 2% (20 MG/ML) 5 ML SYRINGE
INTRAMUSCULAR | Status: AC
  Filled 2017-03-02: qty 5

## 2017-03-02 MED ORDER — HEPARIN SODIUM (PORCINE) 1000 UNIT/ML IJ SOLN
INTRAMUSCULAR | Status: AC
  Filled 2017-03-02: qty 1

## 2017-03-02 MED ORDER — LACTATED RINGERS IV SOLN
INTRAVENOUS | Status: DC | PRN
  Administered 2017-03-02 (×2): via INTRAVENOUS

## 2017-03-02 MED ORDER — MORPHINE SULFATE (PF) 4 MG/ML IV SOLN
1.0000 mg | INTRAVENOUS | Status: DC | PRN
  Administered 2017-03-02 (×2): 2 mg via INTRAVENOUS
  Filled 2017-03-02: qty 1

## 2017-03-02 MED ORDER — MORPHINE SULFATE (PF) 2 MG/ML IV SOLN: 1.0000 mg | INTRAVENOUS | Status: DC | PRN

## 2017-03-02 MED ORDER — 0.9 % SODIUM CHLORIDE (POUR BTL) OPTIME
TOPICAL | Status: DC | PRN
  Administered 2017-03-02: 6000 mL

## 2017-03-02 MED ORDER — ACETAMINOPHEN 160 MG/5ML PO SOLN: 1000.0000 mg | Freq: Four times a day (QID) | ORAL | Status: AC

## 2017-03-02 MED ORDER — SODIUM CHLORIDE 0.9 % IV SOLN: 250.0000 mL | INTRAVENOUS | Status: DC

## 2017-03-02 MED ORDER — ALBUMIN HUMAN 5 % IV SOLN
INTRAVENOUS | Status: DC | PRN
  Administered 2017-03-02: 14:00:00 via INTRAVENOUS

## 2017-03-02 MED ORDER — MORPHINE SULFATE (PF) 4 MG/ML IV SOLN
2.0000 mg | INTRAVENOUS | Status: DC | PRN
  Filled 2017-03-02: qty 1

## 2017-03-02 MED ORDER — ROCURONIUM BROMIDE 10 MG/ML (PF) SYRINGE
PREFILLED_SYRINGE | INTRAVENOUS | Status: AC
  Filled 2017-03-02: qty 5

## 2017-03-02 MED ORDER — ACETAMINOPHEN 160 MG/5ML PO SOLN: 650.0000 mg | Freq: Once | ORAL | Status: AC

## 2017-03-02 MED ORDER — PROTAMINE SULFATE 10 MG/ML IV SOLN
INTRAVENOUS | Status: DC | PRN
  Administered 2017-03-02: 50 mg via INTRAVENOUS
  Administered 2017-03-02: 20 mg via INTRAVENOUS
  Administered 2017-03-02 (×5): 50 mg via INTRAVENOUS

## 2017-03-02 MED ORDER — PHENYLEPHRINE HCL 10 MG/ML IJ SOLN
INTRAVENOUS | Status: DC | PRN
  Administered 2017-03-02: 20 ug/min via INTRAVENOUS

## 2017-03-02 MED ORDER — INSULIN REGULAR BOLUS VIA INFUSION
0.0000 [IU] | Freq: Three times a day (TID) | INTRAVENOUS | Status: DC
  Filled 2017-03-02: qty 10

## 2017-03-02 MED ORDER — SODIUM CHLORIDE 0.9% FLUSH
10.0000 mL | Freq: Two times a day (BID) | INTRAVENOUS | Status: DC
  Administered 2017-03-02 – 2017-03-04 (×4): 10 mL

## 2017-03-02 MED ORDER — METOPROLOL TARTRATE 25 MG/10 ML ORAL SUSPENSION: 12.5000 mg | Freq: Two times a day (BID) | ORAL | Status: DC

## 2017-03-02 MED ORDER — MIDAZOLAM HCL 10 MG/2ML IJ SOLN
INTRAMUSCULAR | Status: AC
  Filled 2017-03-02: qty 2

## 2017-03-02 MED ORDER — HEPARIN SODIUM (PORCINE) 1000 UNIT/ML IJ SOLN
INTRAMUSCULAR | Status: DC | PRN
  Administered 2017-03-02: 5000 [IU] via INTRAVENOUS
  Administered 2017-03-02: 27000 [IU] via INTRAVENOUS

## 2017-03-02 MED ORDER — SODIUM CHLORIDE 0.9 % IJ SOLN
OROMUCOSAL | Status: DC | PRN
  Administered 2017-03-02 (×3): 4 mL via TOPICAL

## 2017-03-02 MED ORDER — DEXTROSE 5 % IV SOLN
1.5000 g | Freq: Two times a day (BID) | INTRAVENOUS | Status: AC
  Administered 2017-03-02 – 2017-03-04 (×4): 1.5 g via INTRAVENOUS
  Filled 2017-03-02 (×4): qty 1.5

## 2017-03-02 MED ORDER — EPHEDRINE 5 MG/ML INJ
INTRAVENOUS | Status: AC
  Filled 2017-03-02: qty 10

## 2017-03-02 MED ORDER — VANCOMYCIN HCL IN DEXTROSE 1-5 GM/200ML-% IV SOLN
1000.0000 mg | Freq: Once | INTRAVENOUS | Status: AC
  Administered 2017-03-02: 1000 mg via INTRAVENOUS
  Filled 2017-03-02: qty 200

## 2017-03-02 MED ORDER — SODIUM CHLORIDE 0.9 % IV SOLN
INTRAVENOUS | Status: DC
  Filled 2017-03-02: qty 1

## 2017-03-02 MED ORDER — ASPIRIN EC 325 MG PO TBEC
325.0000 mg | DELAYED_RELEASE_TABLET | Freq: Every day | ORAL | Status: DC
  Administered 2017-03-03 – 2017-03-08 (×6): 325 mg via ORAL
  Filled 2017-03-02 (×6): qty 1

## 2017-03-02 MED ORDER — HEMOSTATIC AGENTS (NO CHARGE) OPTIME
TOPICAL | Status: DC | PRN
  Administered 2017-03-02: 1 via TOPICAL

## 2017-03-02 MED ORDER — SUCCINYLCHOLINE CHLORIDE 200 MG/10ML IV SOSY
PREFILLED_SYRINGE | INTRAVENOUS | Status: AC
  Filled 2017-03-02: qty 10

## 2017-03-02 MED ORDER — ONDANSETRON HCL 4 MG/2ML IJ SOLN
4.0000 mg | Freq: Four times a day (QID) | INTRAMUSCULAR | Status: DC | PRN
  Administered 2017-03-03: 4 mg via INTRAVENOUS
  Filled 2017-03-02: qty 2

## 2017-03-02 MED ORDER — POTASSIUM CHLORIDE 10 MEQ/50ML IV SOLN
10.0000 meq | INTRAVENOUS | Status: AC
  Administered 2017-03-02 (×3): 10 meq via INTRAVENOUS

## 2017-03-02 MED ORDER — PANTOPRAZOLE SODIUM 40 MG PO TBEC
40.0000 mg | DELAYED_RELEASE_TABLET | Freq: Every day | ORAL | Status: DC
  Administered 2017-03-04 – 2017-03-08 (×5): 40 mg via ORAL
  Filled 2017-03-02 (×5): qty 1

## 2017-03-02 MED ORDER — SODIUM CHLORIDE 0.45 % IV SOLN
INTRAVENOUS | Status: DC | PRN
  Administered 2017-03-02: 15:00:00 via INTRAVENOUS

## 2017-03-02 MED ORDER — MUPIROCIN 2 % EX OINT
1.0000 "application " | TOPICAL_OINTMENT | Freq: Two times a day (BID) | CUTANEOUS | Status: AC
  Administered 2017-03-02 – 2017-03-06 (×9): 1 via NASAL
  Filled 2017-03-02 (×2): qty 22

## 2017-03-02 MED ORDER — ACETAMINOPHEN 650 MG RE SUPP
650.0000 mg | Freq: Once | RECTAL | Status: AC
  Administered 2017-03-02: 650 mg via RECTAL

## 2017-03-02 MED ORDER — FENTANYL CITRATE (PF) 250 MCG/5ML IJ SOLN
INTRAMUSCULAR | Status: AC
  Filled 2017-03-02: qty 5

## 2017-03-02 MED ORDER — ARTIFICIAL TEARS OPHTHALMIC OINT
TOPICAL_OINTMENT | OPHTHALMIC | Status: DC | PRN
  Administered 2017-03-02: 1 via OPHTHALMIC

## 2017-03-02 MED ORDER — FENTANYL CITRATE (PF) 250 MCG/5ML IJ SOLN
INTRAMUSCULAR | Status: AC
  Filled 2017-03-02: qty 25

## 2017-03-02 MED ORDER — MORPHINE SULFATE (PF) 2 MG/ML IV SOLN: 2.0000 mg | INTRAVENOUS | Status: DC | PRN

## 2017-03-02 MED ORDER — PROTAMINE SULFATE 10 MG/ML IV SOLN
INTRAVENOUS | Status: AC
  Filled 2017-03-02: qty 10

## 2017-03-02 MED ORDER — ALBUMIN HUMAN 5 % IV SOLN: 250.0000 mL | INTRAVENOUS | Status: DC | PRN

## 2017-03-02 MED ORDER — NITROGLYCERIN IN D5W 200-5 MCG/ML-% IV SOLN: 0.0000 ug/min | INTRAVENOUS | Status: DC

## 2017-03-02 MED ORDER — MIDAZOLAM HCL 2 MG/2ML IJ SOLN
INTRAMUSCULAR | Status: AC
  Filled 2017-03-02: qty 2

## 2017-03-02 MED ORDER — LACTATED RINGERS IV SOLN: 500.0000 mL | Freq: Once | INTRAVENOUS | Status: DC | PRN

## 2017-03-02 MED ORDER — LACTATED RINGERS IV SOLN: INTRAVENOUS | Status: DC

## 2017-03-02 MED ORDER — MIDAZOLAM HCL 2 MG/2ML IJ SOLN
2.0000 mg | INTRAMUSCULAR | Status: DC | PRN
  Filled 2017-03-02: qty 2

## 2017-03-02 MED ORDER — SODIUM CHLORIDE 0.9% FLUSH
3.0000 mL | Freq: Two times a day (BID) | INTRAVENOUS | Status: DC
  Administered 2017-03-03 – 2017-03-04 (×3): 3 mL via INTRAVENOUS
  Administered 2017-03-04: 10 mL via INTRAVENOUS
  Administered 2017-03-05 – 2017-03-06 (×2): 3 mL via INTRAVENOUS

## 2017-03-02 MED ORDER — FENTANYL CITRATE (PF) 100 MCG/2ML IJ SOLN
INTRAMUSCULAR | Status: DC | PRN
  Administered 2017-03-02: 50 ug via INTRAVENOUS
  Administered 2017-03-02: 250 ug via INTRAVENOUS
  Administered 2017-03-02 (×2): 100 ug via INTRAVENOUS
  Administered 2017-03-02: 150 ug via INTRAVENOUS
  Administered 2017-03-02: 100 ug via INTRAVENOUS
  Administered 2017-03-02 (×2): 150 ug via INTRAVENOUS
  Administered 2017-03-02: 50 ug via INTRAVENOUS
  Administered 2017-03-02: 250 ug via INTRAVENOUS
  Administered 2017-03-02: 100 ug via INTRAVENOUS
  Administered 2017-03-02: 50 ug via INTRAVENOUS

## 2017-03-02 MED ORDER — MUPIROCIN 2 % EX OINT
1.0000 "application " | TOPICAL_OINTMENT | Freq: Two times a day (BID) | CUTANEOUS | Status: DC
  Filled 2017-03-02: qty 22

## 2017-03-02 MED ORDER — PROTAMINE SULFATE 10 MG/ML IV SOLN
INTRAVENOUS | Status: AC
  Filled 2017-03-02: qty 25

## 2017-03-02 MED ORDER — BISACODYL 10 MG RE SUPP: 10.0000 mg | Freq: Every day | RECTAL | Status: DC

## 2017-03-02 MED ORDER — PHENYLEPHRINE 40 MCG/ML (10ML) SYRINGE FOR IV PUSH (FOR BLOOD PRESSURE SUPPORT)
PREFILLED_SYRINGE | INTRAVENOUS | Status: AC
  Filled 2017-03-02: qty 10

## 2017-03-02 MED ORDER — SODIUM CHLORIDE 0.9% FLUSH: 10.0000 mL | INTRAVENOUS | Status: DC | PRN

## 2017-03-02 MED ORDER — PROPOFOL 10 MG/ML IV BOLUS
INTRAVENOUS | Status: DC | PRN
  Administered 2017-03-02: 50 mg via INTRAVENOUS

## 2017-03-02 MED ORDER — SODIUM CHLORIDE 0.9 % IV SOLN
INTRAVENOUS | Status: DC | PRN
  Administered 2017-03-02: 14:00:00 via INTRAVENOUS

## 2017-03-02 MED ORDER — CHLORHEXIDINE GLUCONATE CLOTH 2 % EX PADS: 6.0000 | MEDICATED_PAD | Freq: Every day | CUTANEOUS | Status: DC

## 2017-03-02 MED ORDER — SODIUM CHLORIDE 0.9 % IV SOLN: INTRAVENOUS | Status: DC

## 2017-03-02 MED ORDER — CHLORHEXIDINE GLUCONATE CLOTH 2 % EX PADS
6.0000 | MEDICATED_PAD | Freq: Every day | CUTANEOUS | Status: DC
  Administered 2017-03-02 – 2017-03-03 (×2): 6 via TOPICAL

## 2017-03-02 MED ORDER — FAMOTIDINE IN NACL 20-0.9 MG/50ML-% IV SOLN
20.0000 mg | Freq: Two times a day (BID) | INTRAVENOUS | Status: DC
  Administered 2017-03-02: 20 mg via INTRAVENOUS

## 2017-03-02 MED ORDER — METOPROLOL TARTRATE 5 MG/5ML IV SOLN
2.5000 mg | INTRAVENOUS | Status: DC | PRN
  Administered 2017-03-03: 5 mg via INTRAVENOUS
  Administered 2017-03-03: 2.5 mg via INTRAVENOUS
  Administered 2017-03-05: 5 mg via INTRAVENOUS
  Filled 2017-03-02 (×3): qty 5

## 2017-03-02 MED ORDER — ACETAMINOPHEN 500 MG PO TABS
1000.0000 mg | ORAL_TABLET | Freq: Four times a day (QID) | ORAL | Status: AC
  Administered 2017-03-03 – 2017-03-07 (×17): 1000 mg via ORAL
  Filled 2017-03-02 (×19): qty 2

## 2017-03-02 MED ORDER — ORAL CARE MOUTH RINSE: 15.0000 mL | Freq: Two times a day (BID) | OROMUCOSAL | Status: DC

## 2017-03-02 MED ORDER — ROCURONIUM BROMIDE 10 MG/ML (PF) SYRINGE
PREFILLED_SYRINGE | INTRAVENOUS | Status: DC | PRN
  Administered 2017-03-02 (×8): 50 mg via INTRAVENOUS

## 2017-03-02 MED ORDER — BISACODYL 5 MG PO TBEC
10.0000 mg | DELAYED_RELEASE_TABLET | Freq: Every day | ORAL | Status: DC
  Administered 2017-03-03 – 2017-03-07 (×4): 10 mg via ORAL
  Filled 2017-03-02 (×5): qty 2

## 2017-03-02 MED ORDER — SODIUM CHLORIDE 0.9 % IV SOLN
0.0000 ug/kg/h | INTRAVENOUS | Status: DC
  Administered 2017-03-02: 0.4 ug/kg/h via INTRAVENOUS
  Filled 2017-03-02 (×2): qty 2

## 2017-03-02 MED ORDER — ARTIFICIAL TEARS OPHTHALMIC OINT
TOPICAL_OINTMENT | OPHTHALMIC | Status: AC
Start: 2017-03-02 — End: 2017-03-02
  Filled 2017-03-02: qty 3.5

## 2017-03-02 MED ORDER — PROPOFOL 10 MG/ML IV BOLUS
INTRAVENOUS | Status: AC
  Filled 2017-03-02: qty 20

## 2017-03-02 MED ORDER — METOPROLOL TARTRATE 12.5 MG HALF TABLET
12.5000 mg | ORAL_TABLET | Freq: Two times a day (BID) | ORAL | Status: DC
  Administered 2017-03-03 – 2017-03-05 (×6): 12.5 mg via ORAL
  Filled 2017-03-02 (×6): qty 1

## 2017-03-02 MED ORDER — MIDAZOLAM HCL 5 MG/5ML IJ SOLN
INTRAMUSCULAR | Status: DC | PRN
  Administered 2017-03-02 (×2): 1 mg via INTRAVENOUS
  Administered 2017-03-02: 3 mg via INTRAVENOUS
  Administered 2017-03-02: 4 mg via INTRAVENOUS
  Administered 2017-03-02: 1 mg via INTRAVENOUS
  Administered 2017-03-02: 2 mg via INTRAVENOUS

## 2017-03-02 MED ORDER — CHLORHEXIDINE GLUCONATE 0.12 % MT SOLN
15.0000 mL | OROMUCOSAL | Status: AC
  Administered 2017-03-02: 15 mL via OROMUCOSAL

## 2017-03-02 MED ORDER — TRAMADOL HCL 50 MG PO TABS
50.0000 mg | ORAL_TABLET | ORAL | Status: DC | PRN
  Administered 2017-03-03 – 2017-03-04 (×4): 50 mg via ORAL
  Administered 2017-03-05 – 2017-03-07 (×4): 100 mg via ORAL
  Filled 2017-03-02: qty 2
  Filled 2017-03-02 (×3): qty 1
  Filled 2017-03-02: qty 2
  Filled 2017-03-02 (×2): qty 1
  Filled 2017-03-02 (×2): qty 2

## 2017-03-02 MED ORDER — MAGNESIUM SULFATE 4 GM/100ML IV SOLN
4.0000 g | Freq: Once | INTRAVENOUS | Status: AC
Start: 2017-03-02 — End: 2017-03-02
  Administered 2017-03-02: 4 g via INTRAVENOUS
  Filled 2017-03-02: qty 100

## 2017-03-02 MED ORDER — ORAL CARE MOUTH RINSE
15.0000 mL | Freq: Two times a day (BID) | OROMUCOSAL | Status: DC
  Administered 2017-03-03 (×2): 15 mL via OROMUCOSAL

## 2017-03-02 MED ORDER — MORPHINE SULFATE (PF) 4 MG/ML IV SOLN
2.0000 mg | INTRAVENOUS | Status: DC | PRN
  Administered 2017-03-02 – 2017-03-03 (×5): 2 mg via INTRAVENOUS
  Administered 2017-03-03: 3 mg via INTRAVENOUS
  Administered 2017-03-03: 2 mg via INTRAVENOUS
  Filled 2017-03-02 (×5): qty 1

## 2017-03-02 MED ORDER — ASPIRIN 81 MG PO CHEW: 324.0000 mg | CHEWABLE_TABLET | Freq: Every day | ORAL | Status: DC

## 2017-03-02 MED ORDER — DOCUSATE SODIUM 100 MG PO CAPS
200.0000 mg | ORAL_CAPSULE | Freq: Every day | ORAL | Status: DC
  Administered 2017-03-03 – 2017-03-07 (×4): 200 mg via ORAL
  Filled 2017-03-02 (×4): qty 2

## 2017-03-02 MED ORDER — SODIUM CHLORIDE 0.9% FLUSH: 3.0000 mL | INTRAVENOUS | Status: DC | PRN

## 2017-03-02 SURGICAL SUPPLY — 117 items
ADAPTER MULTI PERFUSION 15 (ADAPTER) ×1 IMPLANT
APPLIER CLIP 9.375 SM OPEN (CLIP)
APR CLP SM 9.3 20 MLT OPN (CLIP)
BAG DECANTER FOR FLEXI CONT (MISCELLANEOUS) ×3 IMPLANT
BANDAGE ACE 4X5 VEL STRL LF (GAUZE/BANDAGES/DRESSINGS) ×2 IMPLANT
BANDAGE ACE 6X5 VEL STRL LF (GAUZE/BANDAGES/DRESSINGS) ×2 IMPLANT
BASKET HEART (ORDER IN 25'S) (MISCELLANEOUS) ×1
BASKET HEART (ORDER IN 25S) (MISCELLANEOUS) ×2 IMPLANT
BLADE STERNUM SYSTEM 6 (BLADE) ×3 IMPLANT
BLADE SURG 15 STRL LF DISP TIS (BLADE) IMPLANT
BLADE SURG 15 STRL SS (BLADE)
BNDG GAUZE ELAST 4 BULKY (GAUZE/BANDAGES/DRESSINGS) ×2 IMPLANT
CANISTER SUCT 3000ML PPV (MISCELLANEOUS) ×3 IMPLANT
CANNULA EZ GLIDE AORTIC 21FR (CANNULA) ×3 IMPLANT
CATH CPB KIT HENDRICKSON (MISCELLANEOUS) ×3 IMPLANT
CATH ROBINSON RED A/P 18FR (CATHETERS) ×4 IMPLANT
CATH THORACIC 36FR (CATHETERS) ×3 IMPLANT
CATH THORACIC 36FR RT ANG (CATHETERS) ×3 IMPLANT
CLIP APPLIE 9.375 SM OPEN (CLIP) IMPLANT
CLIP VESOCCLUDE MED 24/CT (CLIP) ×2 IMPLANT
CLIP VESOCCLUDE SM WIDE 24/CT (CLIP) ×6 IMPLANT
COVER MAYO STAND STRL (DRAPES) IMPLANT
CRADLE DONUT ADULT HEAD (MISCELLANEOUS) ×3 IMPLANT
DRAPE CARDIOVASCULAR INCISE (DRAPES) ×3
DRAPE HALF SHEET 40X57 (DRAPES) IMPLANT
DRAPE SLUSH/WARMER DISC (DRAPES) ×3 IMPLANT
DRAPE SRG 135X102X78XABS (DRAPES) ×2 IMPLANT
DRSG COVADERM 4X14 (GAUZE/BANDAGES/DRESSINGS) ×3 IMPLANT
ELECT BLADE 4.0 EZ CLEAN MEGAD (MISCELLANEOUS) ×3
ELECT REM PT RETURN 9FT ADLT (ELECTROSURGICAL) ×6
ELECTRODE BLDE 4.0 EZ CLN MEGD (MISCELLANEOUS) IMPLANT
ELECTRODE REM PT RTRN 9FT ADLT (ELECTROSURGICAL) ×4 IMPLANT
FELT TEFLON 1X6 (MISCELLANEOUS) ×5 IMPLANT
GAUZE SPONGE 4X4 12PLY STRL (GAUZE/BANDAGES/DRESSINGS) ×5 IMPLANT
GEL ULTRASOUND 20GR AQUASONIC (MISCELLANEOUS) IMPLANT
GLOVE BIO SURGEON STRL SZ 6 (GLOVE) ×1 IMPLANT
GLOVE BIO SURGEON STRL SZ 6.5 (GLOVE) ×1 IMPLANT
GLOVE BIOGEL PI IND STRL 6 (GLOVE) IMPLANT
GLOVE BIOGEL PI IND STRL 6.5 (GLOVE) IMPLANT
GLOVE BIOGEL PI INDICATOR 6 (GLOVE) ×1
GLOVE BIOGEL PI INDICATOR 6.5 (GLOVE) ×6
GLOVE SURG SIGNA 7.5 PF LTX (GLOVE) ×8 IMPLANT
GOWN STRL REUS W/ TWL LRG LVL3 (GOWN DISPOSABLE) ×8 IMPLANT
GOWN STRL REUS W/ TWL XL LVL3 (GOWN DISPOSABLE) ×4 IMPLANT
GOWN STRL REUS W/TWL LRG LVL3 (GOWN DISPOSABLE) ×12
GOWN STRL REUS W/TWL XL LVL3 (GOWN DISPOSABLE) ×6
HARMONIC SHEARS 14CM COAG (MISCELLANEOUS) ×2 IMPLANT
HEMOSTAT POWDER SURGIFOAM 1G (HEMOSTASIS) ×9 IMPLANT
HEMOSTAT SURGICEL 2X14 (HEMOSTASIS) ×3 IMPLANT
INSERT FOGARTY XLG (MISCELLANEOUS) IMPLANT
KIT BASIN OR (CUSTOM PROCEDURE TRAY) ×3 IMPLANT
KIT ROOM TURNOVER OR (KITS) ×3 IMPLANT
KIT SUCTION CATH 14FR (SUCTIONS) ×6 IMPLANT
KIT VASOVIEW HEMOPRO VH 3000 (KITS) ×2 IMPLANT
LINE EXTENSION DELIVERY (MISCELLANEOUS) ×2 IMPLANT
MARKER GRAFT CORONARY BYPASS (MISCELLANEOUS) ×9 IMPLANT
NS IRRIG 1000ML POUR BTL (IV SOLUTION) ×16 IMPLANT
PACK OPEN HEART (CUSTOM PROCEDURE TRAY) ×3 IMPLANT
PAD ARMBOARD 7.5X6 YLW CONV (MISCELLANEOUS) ×6 IMPLANT
PAD ELECT DEFIB RADIOL ZOLL (MISCELLANEOUS) ×3 IMPLANT
PASSER SUT SWANSON 36MM LOOP (INSTRUMENTS) ×1 IMPLANT
PENCIL BUTTON HOLSTER BLD 10FT (ELECTRODE) ×3 IMPLANT
PUNCH AORTIC ROTATE 4.0MM (MISCELLANEOUS) IMPLANT
PUNCH AORTIC ROTATE 4.5MM 8IN (MISCELLANEOUS) IMPLANT
PUNCH AORTIC ROTATE 5MM 8IN (MISCELLANEOUS) IMPLANT
SHEARS HARMONIC 9CM CVD (BLADE) ×2 IMPLANT
SPONGE LAP 4X18 X RAY DECT (DISPOSABLE) ×1 IMPLANT
SUT BONE WAX W31G (SUTURE) ×3 IMPLANT
SUT ETHIBOND 2 0 SH (SUTURE) ×12
SUT ETHIBOND 2 0 SH 36X2 (SUTURE) IMPLANT
SUT MNCRL AB 4-0 PS2 18 (SUTURE) IMPLANT
SUT PROLENE 3 0 SH DA (SUTURE) ×3 IMPLANT
SUT PROLENE 4 0 RB 1 (SUTURE) ×12
SUT PROLENE 4 0 SH DA (SUTURE) IMPLANT
SUT PROLENE 4-0 RB1 .5 CRCL 36 (SUTURE) IMPLANT
SUT PROLENE 5 0 C 1 36 (SUTURE) ×2 IMPLANT
SUT PROLENE 6 0 C 1 30 (SUTURE) ×8 IMPLANT
SUT PROLENE 7 0 BV1 MDA (SUTURE) ×3 IMPLANT
SUT PROLENE 8 0 BV175 6 (SUTURE) ×6 IMPLANT
SUT SILK  1 MH (SUTURE) ×4
SUT SILK 1 MH (SUTURE) IMPLANT
SUT SILK 1 TIES 10X30 (SUTURE) ×1 IMPLANT
SUT SILK 2 0 SH (SUTURE) ×2 IMPLANT
SUT SILK 2 0 SH CR/8 (SUTURE) ×2 IMPLANT
SUT SILK 2 0 TIES 10X30 (SUTURE) ×1 IMPLANT
SUT SILK 2 0 TIES 17X18 (SUTURE) ×3
SUT SILK 2-0 18XBRD TIE BLK (SUTURE) IMPLANT
SUT SILK 3 0 SH CR/8 (SUTURE) ×1 IMPLANT
SUT SILK 4 0 TIE 10X30 (SUTURE) ×2 IMPLANT
SUT STEEL 6MS V (SUTURE) ×3 IMPLANT
SUT STEEL STERNAL CCS#1 18IN (SUTURE) IMPLANT
SUT STEEL SZ 6 DBL 3X14 BALL (SUTURE) ×3 IMPLANT
SUT TEM PAC WIRE 2 0 SH (SUTURE) ×4 IMPLANT
SUT VIC AB 1 CTX 36 (SUTURE) ×6
SUT VIC AB 1 CTX36XBRD ANBCTR (SUTURE) ×4 IMPLANT
SUT VIC AB 2-0 CT1 27 (SUTURE)
SUT VIC AB 2-0 CT1 TAPERPNT 27 (SUTURE) IMPLANT
SUT VIC AB 2-0 CTX 27 (SUTURE) ×2 IMPLANT
SUT VIC AB 3-0 SH 27 (SUTURE)
SUT VIC AB 3-0 SH 27X BRD (SUTURE) IMPLANT
SUT VIC AB 3-0 X1 27 (SUTURE) ×2 IMPLANT
SUT VIC AB 4-0 PS2 18 (SUTURE) ×1 IMPLANT
SUT VIC AB 4-0 RB1 27 (SUTURE) ×3
SUT VIC AB 4-0 RB1 27X BRD (SUTURE) IMPLANT
SUT VICRYL 4-0 PS2 18IN ABS (SUTURE) IMPLANT
SUTURE E-PAK OPEN HEART (SUTURE) ×2 IMPLANT
SYR 50ML SLIP (SYRINGE) IMPLANT
SYSTEM SAHARA CHEST DRAIN ATS (WOUND CARE) ×3 IMPLANT
TOWEL GREEN STERILE (TOWEL DISPOSABLE) ×12 IMPLANT
TOWEL GREEN STERILE FF (TOWEL DISPOSABLE) ×6 IMPLANT
TOWEL OR 17X24 6PK STRL BLUE (TOWEL DISPOSABLE) ×6 IMPLANT
TOWEL OR 17X26 10 PK STRL BLUE (TOWEL DISPOSABLE) ×6 IMPLANT
TRAY FOLEY SILVER 16FR TEMP (SET/KITS/TRAYS/PACK) ×3 IMPLANT
TUBE FEEDING 8FR 16IN STR KANG (MISCELLANEOUS) ×3 IMPLANT
TUBING INSUFFLATION (TUBING) ×3 IMPLANT
UNDERPAD 30X30 (UNDERPADS AND DIAPERS) ×3 IMPLANT
WATER STERILE IRR 1000ML POUR (IV SOLUTION) ×6 IMPLANT

## 2017-03-02 NOTE — Anesthesia Procedure Notes (Signed)
Procedure Name: Intubation Date/Time: 03/02/2017 8:30 AM Performed by: Rise Patience T Pre-anesthesia Checklist: Patient identified, Emergency Drugs available, Suction available and Patient being monitored Patient Re-evaluated:Patient Re-evaluated prior to induction Oxygen Delivery Method: Circle System Utilized Preoxygenation: Pre-oxygenation with 100% oxygen Induction Type: IV induction Ventilation: Mask ventilation without difficulty and Oral airway inserted - appropriate to patient size Laryngoscope Size: Glidescope and 4 Grade View: Grade I Tube type: Oral Tube size: 8.0 mm Number of attempts: 1 Airway Equipment and Method: Stylet and Oral airway Placement Confirmation: ETT inserted through vocal cords under direct vision,  positive ETCO2 and breath sounds checked- equal and bilateral Secured at: 23 cm Tube secured with: Tape Dental Injury: Teeth and Oropharynx as per pre-operative assessment

## 2017-03-02 NOTE — Progress Notes (Signed)
SICU wean started at 1610. Vent check charted at 1515 was an error. Chart corrected

## 2017-03-02 NOTE — Anesthesia Preprocedure Evaluation (Addendum)
Anesthesia Evaluation  Patient identified by MRN, date of birth, ID band Patient awake    Reviewed: Allergy & Precautions, NPO status , Patient's Chart, lab work & pertinent test results  Airway Mallampati: II  TM Distance: >3 FB Neck ROM: Limited    Dental  (+) Teeth Intact, Dental Advisory Given   Pulmonary former smoker,    breath sounds clear to auscultation       Cardiovascular hypertension, Pt. on medications and Pt. on home beta blockers + angina with exertion + CAD, + Cardiac Stents and + Peripheral Vascular Disease   Rhythm:Regular Rate:Normal     Neuro/Psych    GI/Hepatic GERD  Medicated,  Endo/Other    Renal/GU      Musculoskeletal  (+) Arthritis , Osteoarthritis,    Abdominal   Peds  Hematology   Anesthesia Other Findings   Reproductive/Obstetrics                            Anesthesia Physical Anesthesia Plan  ASA: IV  Anesthesia Plan: General   Post-op Pain Management:    Induction: Intravenous  PONV Risk Score and Plan: Ondansetron and Dexamethasone  Airway Management Planned: Oral ETT and Video Laryngoscope Planned  Additional Equipment: Arterial line, CVP, PA Cath, 3D TEE and Ultrasound Guidance Line Placement  Intra-op Plan:   Post-operative Plan: Post-operative intubation/ventilation  Informed Consent: I have reviewed the patients History and Physical, chart, labs and discussed the procedure including the risks, benefits and alternatives for the proposed anesthesia with the patient or authorized representative who has indicated his/her understanding and acceptance.   Dental advisory given  Plan Discussed with: CRNA, Anesthesiologist and Surgeon  Anesthesia Plan Comments:        Anesthesia Quick Evaluation

## 2017-03-02 NOTE — Anesthesia Procedure Notes (Signed)
Central Venous Catheter Insertion Performed by: Kipp Brood, anesthesiologist Start/End10/08/2016 7:00 AM, 03/02/2017 7:05 AM Preanesthetic checklist: patient identified, IV checked, site marked, risks and benefits discussed, surgical consent, monitors and equipment checked, pre-op evaluation and timeout performed Position: supine Hand hygiene performed , maximum sterile barriers used  and Seldinger technique used Catheter size: 8 Fr Central line was placed.Double lumen Ultrasound Notes:anatomy identified and image(s) printed for medical record Attempts: 1 Following insertion, line sutured, dressing applied and Biopatch. Post procedure assessment: blood return through all ports, free fluid flow and no air  Patient tolerated the procedure well with no immediate complications.

## 2017-03-02 NOTE — Interval H&P Note (Signed)
History and Physical Interval Note:  03/02/2017 8:03 AM  Derrick [name]  has presented today for surgery, with the diagnosis of CABG  The various methods of treatment have been discussed with the patient and family. After consideration of risks, benefits and other options for treatment, the patient has consented to  Procedure(s): CORONARY ARTERY BYPASS GRAFTING (CABG)/BILATERAL MAMMARIES (N/A) TRANSESOPHAGEAL ECHOCARDIOGRAM (TEE) (N/A) RADIAL ARTERY HARVEST/POSSIBLE (N/A) as a surgical intervention .  The patient's history has been reviewed, patient examined, no change in status, stable for surgery.  I have reviewed the patient's chart and labs.  Questions were answered to the patient's satisfaction.     Loreli Slot

## 2017-03-02 NOTE — Op Note (Signed)
NAMEMarland Kitchen  JAYMZ, TRAYWICK              ACCOUNT NO.:  [id number]  MEDICAL RECORD NO.:  [id number]  LOCATION:                                 FACILITY:  PHYSICIAN:  Norma Ignasiak. Dorris Fetch, M.D. DATE OF BIRTH:  DATE OF PROCEDURE:  03/02/2017 DATE OF DISCHARGE:                              OPERATIVE REPORT   PREOPERATIVE DIAGNOSIS:  Left main and 3-vessel coronary artery disease with unstable angina.  POSTOPERATIVE DIAGNOSIS:  Left main and 3-vessel coronary artery disease with unstable angina.  PROCEDURES:  Median sternotomy, extracorporeal circulation, coronary artery bypass grafting x2 (left internal mammary artery to LAD, Y-graft right mammary from left mammary to OM).  SURGEON:  Payson Evrard. Dorris Fetch, MD.  ASSISTANT:  Gershon Crane, PA-C.  ANESTHESIA:  General.  FINDINGS:  Distal vessels in right coronary distribution too small to graft.  LAD and OM-1 good quality targets.  Mammary arteries good quality.  Right mammary of insufficient length to reach as a pedicle graft to either the LAD or OM or as a free graft to the OM from the aorta, therefore taken as a Y-graft off the left internal mammary.  Transesophageal echocardiography revealed preserved left ventricular function.  There was significant mobile atherosclerotic plaque in the descending thoracic aorta.  No significant valvular pathology.  CLINICAL NOTE:  Mr. Leitz is a 61 year old gentleman with a longstanding history of coronary artery disease and peripheral arterial disease.  He presented with exertional chest pain.  He underwent cardiac catheterization where he was found to have a 70% ostial left main stenosis in the setting of a chronically totally occluded right coronary artery.  There was moderate disease in the left coronary system beyond the left main stenosis.  He was advised to have coronary artery bypass grafting and the indications, risks, benefits, and alternatives were discussed in detail with the patient.   He understood and accepted the risks and agreed to proceed.  OPERATIVE NOTE:  Mr. Meng was brought to the preoperative holding area on March 02, 2017.  Anesthesia placed a Swan-Ganz catheter and arterial blood pressure monitoring line.  He was taken to the operating room, anesthetized, and intubated.  Intravenous antibiotics were administered.  Transesophageal echocardiography was performed.  It showed preserved left ventricular function with no significant valvular pathology.  The ascending aorta was relatively free of atherosclerotic disease, but the descending aorta had significant atherosclerotic disease with mobile atheromata.  A Foley catheter was placed.  The chest, abdomen, and legs were prepped and draped in the usual sterile fashion.  A median sternotomy was performed, and the left internal mammary artery was harvested using standard technique.  5000 units of heparin was administered during the vessel harvest. This was difficult due to the patient's body habitus and relatively noncompliant chest wall.  The mammary artery was a 2-mm, good quality conduit.  It had excellent flow when divided distally.  The right internal mammary artery then was harvested in a similar fashion.  It also was a relatively large, good quality vessel with excellent flow and when divided distally.  The remainder of the full heparin dose was given.  A sternal retractor was placed.  The pericardium was opened.  The ascending aorta  was inspected.  There were no findings suspicious for atherosclerotic disease.  After confirming adequate anticoagulation with ACT measurement, the aorta was cannulated via concentric 2-0 Ethibond pledgeted pursestring sutures.  A dual-stage venous cannula was placed via pursestring suture in the right atrial appendage.  Cardiopulmonary bypass was initiated and flow was maintained per protocol.  The patient was cooled to 34 degrees Celsius.  The coronary arteries were  inspected. The branches of the right coronary artery, posterior descending, and posterior lateral branches were all too small to consider for grafting. OM-1 and LAD were both good quality vessels.  The conduits were inspected.  It was determined that the right mammary would not reach to the OM either as a pedicle or free graft and also would not reach the LAD.  Therefore, the right mammary was divided proximally.  The proximal stump was suture ligated.  The left mammary was brought through a window in the pericardium, and the distal end was beveled for anastomosis to the LAD.  An arteriotomy was made in the left mammary, and the proximal end of the right mammary was anastomosed end-to-side with a running 7-0 Prolene suture.  After completion of the anastomosis, there was good flow through both limbs of the Y-graft.  The aorta was crossclamped.  The left ventricle was emptied via the aortic root vent.  Cardiac arrest then was achieved with a combination of cold antegrade blood cardioplegia and topical iced saline.  One liter of cardioplegia was administered.  There was a rapid diastolic arrest and septal cooling to 9 degrees Celsius.  Taking care to keep the orientation of the mammary graft intact, the distal end of the right mammary was beveled and it was anastomosed end- to-side to the large OM branch of the left circumflex.  These were both 1.5 mm vessels.  There was some plaque in the OM.  The anastomosis was performed with a running 8-0 Prolene suture.  At the completion of anastomosis, the bulldog clamp was briefly removed.  There was good hemostasis.  The mammary pedicle was tacked to the epicardial surface of the heart.  Next, the distal end of the left mammary was anastomosed end-to-side to the LAD.  The LAD was 2 mm, good quality target at the site of the anastomosis.  The anastomosis was performed with a running 8-0 Prolene suture.  At the completion of the mammary to LAD  anastomosis, the bulldog clamp was removed.  Rapid septal rewarming was noted.  The bulldog clamp was then removed from the right mammary as well. Lidocaine was administered and the aortic crossclamp was removed.  The total crossclamp time was 41 minutes.  The patient required 2 defibrillations with 10 joules and then was in sinus rhythm thereafter.  While rewarming was completed, epicardial pacing wires were placed on the right ventricle and right atrium and all anastomoses were inspected for hemostasis.  DDD pacing was initiated, and the patient was weaned from cardiopulmonary bypass on the first attempt without difficulty. The total bypass time was 99 minutes.  The initial cardiac index was greater than 2 L/min/m2.  The patient remained hemodynamically stable throughout the post-bypass period.  Post-bypass transesophageal echocardiography was unchanged from the prebypass study.  A test dose of protamine was administered, and the atrial and aortic cannulae were removed.  The remainder of the protamine was administered without incident.  The chest was irrigated with warm saline.  Hemostasis was achieved.  Bilateral pleural and a single mediastinal chest tubes were placed through  separate subcostal incisions.  The sternum was closed with a combination of single and double heavy gauge stainless steel wires.  The pectoralis fascia, subcutaneous tissue, and skin were closed in standard fashion.  All sponge, needle, and instrument counts were correct at the end of the procedure.  The patient was taken from the operating room to the Surgical Intensive Care Unit intubated and in good condition.     Salvatore Decent Dorris Fetch, M.D.     SCH/MEDQ  D:  03/02/2017  T:  03/02/2017  Job:  [id number]

## 2017-03-02 NOTE — Progress Notes (Signed)
Patient extubated to [facility] at this time per SICU wean protocol. Seems to be doing well. IS at bedside

## 2017-03-02 NOTE — Anesthesia Postprocedure Evaluation (Signed)
Anesthesia Post Note  Patient: DEUNTAE KOCSIS  Procedure(s) Performed: CORONARY ARTERY BYPASS GRAFTING (CABG), ON PUMP, TIMES TWO, USING BILATERAL MAMMARY ARTERIES (N/A Chest) TRANSESOPHAGEAL ECHOCARDIOGRAM (TEE) (N/A )     Patient location during evaluation: SICU Anesthesia Type: General Level of consciousness: awake, awake and alert and oriented Pain management: pain level controlled Vital Signs Assessment: post-procedure vital signs reviewed and stable Respiratory status: spontaneous breathing, nonlabored ventilation and respiratory function stable Anesthetic complications: no    Last Vitals:  Vitals:   03/02/17 2057 03/02/17 2100  BP: (!) 165/63 119/73  Pulse: 84 85  Resp: (!) 25 (!) 28  Temp:  37.4 C  SpO2: 100% 97%    Last Pain:  Vitals:   03/02/17 2030  TempSrc:   PainSc: 8                  Mikaia Janvier COKER

## 2017-03-02 NOTE — OR Nursing (Signed)
Twenty minute call to SICU charge nurse at 1344. Spoke to [place].

## 2017-03-02 NOTE — Progress Notes (Signed)
NIF -20, VC 0.8-0.9L. Waiting on ABG

## 2017-03-02 NOTE — Transfer of Care (Signed)
Immediate Anesthesia Transfer of Care Note  Patient: Derrick [name]  Procedure(s) Performed: CORONARY ARTERY BYPASS GRAFTING (CABG), ON PUMP, TIMES TWO, USING BILATERAL MAMMARY ARTERIES (N/A Chest) TRANSESOPHAGEAL ECHOCARDIOGRAM (TEE) (N/A )  Patient Location: SICU  Anesthesia Type:General  Level of Consciousness: sedated, unresponsive and Patient remains intubated per anesthesia plan  Airway & Oxygen Therapy: Patient remains intubated per anesthesia plan and Patient placed on Ventilator (see vital sign flow sheet for setting)  Post-op Assessment: Report given to RN and Post -op Vital signs reviewed and stable  Post vital signs: Reviewed and stable  Last Vitals:  Vitals:   03/02/17 0025 03/02/17 0439  BP: 135/68 (!) 154/75  Pulse: (!) 54 (!) 53  Resp: 20 20  Temp: 36.7 C 36.5 C  SpO2: 97% 98%    Last Pain:  Vitals:   03/02/17 0439  TempSrc: Oral      Patients Stated Pain Goal: 3 (03/01/17 1311)  Complications: No apparent anesthesia complications

## 2017-03-02 NOTE — H&P (View-Only) (Signed)
Reason for Consult:Left main disease Referring Physician: Dr. McAlhany/ Nahser  Derrick [name] is an 61 y.o. male.  HPI: 61 yo man with a past history of CAD (2 stents in 2006), PAD(bilateral fem-pop), hyperlipidemia, hypertension, remote tobacco abuse, osteoarthritis, hyperparathyroidism, multiple previous neck surgeries.  He presents with a 4 week Hx of chest discomfort with exertion. Says it feels like his chest is being pulled from both sides. relieved with rest. On Saturday had a prolonged episode and decided to see a physician.  Today he had cardiac catheterization. It revealed a chronically totally occluded RCA and a new 70% ostial left main stenosis. Both stents patent. EF normal  Past Medical History:  Diagnosis Date  . Coronary artery disease 2006   stents x2=LAD LCX; totally occluded RCA  . Dyslipidemia   . GERD (gastroesophageal reflux disease)   . History of bronchitis   . Hyperlipidemia    statin intolerant  . Hyperparathyroidism   . Hypertension   . Insomnia   . Metatarsalgia   . OA (osteoarthritis)   . Peripheral vascular disease (HCC)   . Seasonal allergies   . Urinary frequency   . Wears glasses     Past Surgical History:  Procedure Laterality Date  . CARDIAC CATHETERIZATION  10/28/2004   circ and mid LAD chyper stents  . CERVICAL FUSION  2007,2009  . CERVICAL SPINE SURGERY  2004  . COLONOSCOPY    . CORONARY ANGIOPLASTY  2006   stents placed   . ELBOW ARTHROPLASTY  1994   right  . ENDARTERECTOMY FEMORAL Left 03/17/2015   Procedure: ENDARTERECTOMY COMMON FEMORAL WITH PROFUNDOPLASTY;  Surgeon: Charles E Fields, MD;  Location: [facility] OR;  Service: Vascular;  Laterality: Left;  . FEMORAL-POPLITEAL BYPASS GRAFT Left 03/17/2015   Procedure: BYPASS GRAFT FEMORAL-POPLITEAL ARTERY-LEFT LEG AND POSTERIOR TIBIAL SEQUENTIAL GRAFT TO BELOW KNEE POPLITEAL ARTERY;  Surgeon: Charles E Fields, MD;  Location: [facility] OR;  Service: Vascular;  Laterality: Left;  . FEMORAL-POPLITEAL  BYPASS GRAFT Right 05/11/2015   Procedure:  RIGHT FEMORAL-BELOW THE KNEE POPLITEAL ARTERY BYPASS GRAFT USING NON REVERSE RIGHT GREATER SAPHENOUS VEIN;  Surgeon: Charles E Fields, MD;  Location: [facility] OR;  Service: Vascular;  Laterality: Right;  . INCISION AND DRAINAGE ABSCESS POSTERIOR CERVICALSPINE  2009  . INTRAOPERATIVE ARTERIOGRAM Left 03/17/2015   Procedure: INTRA OPERATIVE ARTERIOGRAM X2;  Surgeon: Charles E Fields, MD;  Location: [facility] OR;  Service: Vascular;  Laterality: Left;  . INTRAOPERATIVE ARTERIOGRAM Right 05/11/2015   Procedure: INTRA OPERATIVE ARTERIOGRAM;  Surgeon: Charles E Fields, MD;  Location: [facility] OR;  Service: Vascular;  Laterality: Right;  . PATCH ANGIOPLASTY Left 03/17/2015   Procedure: VEIN PATCH ANGIOPLASTY COMMON FEMORAL ARTERY;  Surgeon: Charles E Fields, MD;  Location: [facility] OR;  Service: Vascular;  Laterality: Left;  . PERIPHERAL VASCULAR CATHETERIZATION N/A 01/16/2015   Procedure: Abdominal Aortogram;  Surgeon: Charles E Fields, MD;  Location: [facility];  Service: Cardiovascular;  Laterality: N/A;  . SHOULDER OPEN ROTATOR CUFF REPAIR Right 04/23/2013   Procedure: RIGHT TWO TENDON ROTATOR CUFF REPAIR  SUBACROMIAL DECOMPRESSION AND OPEN DISTAL CLAVICLE RESECTION;  Surgeon: Robert V Sypher Jr., MD;  Location: [facility];  Service: Orthopedics;  Laterality: Right;  . stents placed   2006  . VEIN HARVEST Right 05/11/2015   Procedure: WITH NON REVERSE RIGHT GREATER SAPHENOUS VEIN HARVEST;  Surgeon: Charles E Fields, MD;  Location: [facility] OR;  Service: Vascular;  Laterality: Right;    Family History  Problem Relation Age   of Onset  . Arthritis Mother   . Varicose Veins Mother   . Lung cancer Father   . COPD Father        Lung  . Cancer Brother        Lukemia  . COPD Brother   . Cancer Brother        Prostate    Social History:  reports that he quit smoking about 27 years ago. His smoking use included Cigarettes. He has never used smokeless tobacco. He  reports that he drinks alcohol. He reports that he does not use drugs.  Allergies:  Allergies  Allergen Reactions  . Atorvastatin     Muscle aches  . Codeine Itching  . Crestor [Rosuvastatin Calcium]     Muscle aches  . Meloxicam Other (See Comments)    Can not take due to kidneys  . Statins Other (See Comments)    myalgia    Medications:  Scheduled: . [START ON 03/02/2017] aspirin EC  81 mg Oral Daily  . [START ON 03/02/2017] calcitRIOL  0.5 mcg Oral Daily  . calcium carbonate  500 mg of elemental calcium Oral BID WC  . ezetimibe  10 mg Oral Daily  . fenofibrate  160 mg Oral Daily  . gabapentin  300 mg Oral QPM  . [START ON 03/02/2017] Influenza vac split quadrivalent PF  0.5 mL Intramuscular Tomorrow-1000  . losartan  100 mg Oral QPM  . metoprolol tartrate  12.5 mg Oral BID  . sodium chloride flush  3 mL Intravenous Q12H  . traZODone  50 mg Oral QHS  . [START ON 03/02/2017] vitamin B-12  2,500 mcg Oral Daily    No results found for this or any previous visit (from the past 48 hour(s)).  No results found.  Review of Systems  Constitutional: Positive for malaise/fatigue. Negative for fever.  Eyes: Negative for blurred vision and double vision.  Respiratory: Positive for shortness of breath. Negative for cough and wheezing.   Cardiovascular: Positive for chest pain and leg swelling. Negative for claudication.  Gastrointestinal: Negative for nausea and vomiting.  Genitourinary: Negative for dysuria and urgency.  Musculoskeletal: Positive for joint pain and neck pain.  Neurological: Negative for focal weakness and loss of consciousness.  Endo/Heme/Allergies: Does not bruise/bleed easily.  All other systems reviewed and are negative.  Blood pressure (!) 167/75, pulse 61, temperature 98.4 F (36.9 C), temperature source Oral, resp. rate 20, height 6' 2" (1.88 m), weight 217 lb (98.4 kg), SpO2 98 %. Physical Exam  Vitals reviewed. Constitutional: He is oriented to person,  place, and time. He appears well-developed and well-nourished. No distress.  HENT:  Head: Normocephalic and atraumatic.  Mouth/Throat: No oropharyngeal exudate.  Eyes: Conjunctivae and EOM are normal. No scleral icterus.  Neck: Neck supple. No thyromegaly present.  Right carotid bruit  Cardiovascular: Normal rate, regular rhythm and normal heart sounds.  Exam reveals no gallop and no friction rub.   No murmur heard. Respiratory: Effort normal and breath sounds normal. No respiratory distress. He has no wheezes. He has no rales.  Hypertrophy of right [place] joint, nontender  GI: Soft. He exhibits no distension. There is no tenderness.  Musculoskeletal: He exhibits edema.  Lymphadenopathy:    He has no cervical adenopathy.  Neurological: He is alert and oriented to person, place, and time. No cranial nerve deficit. He exhibits normal muscle tone. Coordination normal.  Skin: Skin is warm and dry.  Well healed surgical scars both LE   CARDIAC CATHETERIZATION   Conclusion     Ost RCA to Prox RCA lesion, 100 %stenosed.  Ost LM lesion, 70 %stenosed.  Mid Cx lesion, 10 %stenosed.  Prox Cx lesion, 50 %stenosed.  Mid LAD lesion, 10 %stenosed.  The left ventricular systolic function is normal.  LV end diastolic pressure is normal.  The left ventricular ejection fraction is greater than 65% by visual estimate.  There is no mitral valve regurgitation.   1. Severe triple vessel CAD 2. Severe ostial left main stenosis with dampening of pressures with catheter engagement.  3. Patent stent mid LAD 4. Patent stent mid Circumflex with moderate disease in the proximal Circumflex prior to the stent.  5. Chronic occlusion RCA with filling of distal branches by left to right collaterals.  6. Normal LV systolic function  Recommendations: He has high grade stenosis of the ostial left main artery and filling of the RCA from left to right collaterals. CABG is the best method for revascularization  given his anatomy. Will consult CT surgery.     I personally reviewed the cath images and concur with the findings noted above  Assessment/Plan: 61 yo man with known CAD who presents with new onset class III angina. Cath showed left main disease in setting of a totally occluded RCA. CABG is indicated for survival benefit and relief of symptoms.  I discussed the general nature of the procedure, including the need for general anesthesia, the use of cardiopulmonary bypass and the incisions to be used with Mr. Weideman and his family. We discussed the expected hospital stay, overall recovery and short and long term outcomes. I informed them of the indications, risks, benefits and alternatives. They understand the risks include, but are not limited to death, stroke, MI, DVT/PE, bleeding, possible need for transfusion, infections, cardiac arrhythmias, as well as other organ system dysfunction including respiratory, renal, or GI complications.   He accepts the risks and agrees to proceed.  For CABG in AM  Buster C Mattisyn Cardona 03/01/2017, 5:45 PM     

## 2017-03-02 NOTE — OR Nursing (Signed)
Forty-five minute call to SICU charge nurse at 1315. Spoke to [place].

## 2017-03-02 NOTE — Anesthesia Procedure Notes (Signed)
Central Venous Catheter Insertion Performed by: Kipp Brood, anesthesiologist Start/End10/08/2016 7:10 AM, 03/02/2017 7:15 AM Patient location: Pre-op. Preanesthetic checklist: patient identified, IV checked, site marked, risks and benefits discussed, surgical consent, monitors and equipment checked, pre-op evaluation and timeout performed Position: supine Hand hygiene performed , maximum sterile barriers used  and Seldinger technique used Catheter size: 8.5 Fr PA cath was placed.Sheath introducer Procedure performed using ultrasound guided technique. Ultrasound Notes:anatomy identified, needle tip was noted to be adjacent to the nerve/plexus identified and no ultrasound evidence of intravascular and/or intraneural injection Attempts: 1 Following insertion, line sutured, dressing applied and Biopatch. Post procedure assessment: blood return through all ports, free fluid flow and no air  Patient tolerated the procedure well with no immediate complications.

## 2017-03-02 NOTE — Progress Notes (Signed)
MD called regarding pt's worsening ABG despite aggressive pulmonary toilet. Gave orders for BiPAP and draw ABG after an hour.

## 2017-03-02 NOTE — Anesthesia Procedure Notes (Signed)
Arterial Line Insertion Start/End10/08/2016 6:55 AM, 03/02/2017 7:00 AM Performed by: Rise Patience T  Patient location: Pre-op. Preanesthetic checklist: patient identified, IV checked, site marked, risks and benefits discussed, surgical consent, monitors and equipment checked, pre-op evaluation and anesthesia consent Lidocaine 1% used for infiltration and patient sedated Left, radial was placed Catheter size: 20 G Hand hygiene performed  and maximum sterile barriers used   Attempts: 1 Procedure performed without using ultrasound guided technique. Ultrasound Notes:anatomy identified and no ultrasound evidence of intravascular and/or intraneural injection Following insertion, dressing applied and Biopatch. Post procedure assessment: normal  Patient tolerated the procedure well with no immediate complications.

## 2017-03-02 NOTE — Progress Notes (Signed)
Progress Note  Patient Name: Derrick [name] Date of Encounter: 03/02/2017  Primary Cardiologist: Nahser  Subjective   No chest pain for CABG this am   Inpatient Medications    Scheduled Meds: . [MAR Hold] aspirin EC  81 mg Oral Daily  . [MAR Hold] calcitRIOL  0.5 mcg Oral Daily  . [MAR Hold] calcium carbonate  500 mg of elemental calcium Oral BID WC  . [MAR Hold] Chlorhexidine Gluconate Cloth  6 each Topical Daily  . [MAR Hold] ezetimibe  10 mg Oral Daily  . [MAR Hold] fenofibrate  160 mg Oral Daily  . [MAR Hold] gabapentin  300 mg Oral QPM  . heparin-papaverine-plasmalyte irrigation   Irrigation To OR  . [MAR Hold] Influenza vac split quadrivalent PF  0.5 mL Intramuscular Tomorrow-1000  . [MAR Hold] losartan  100 mg Oral QPM  . magnesium sulfate  40 mEq Other To OR  . [MAR Hold] metoprolol tartrate  12.5 mg Oral BID  . [MAR Hold] mupirocin ointment  1 application Nasal BID  . potassium chloride  80 mEq Other To OR  . [MAR Hold] sodium chloride flush  3 mL Intravenous Q12H  . tranexamic acid  15 mg/kg Intravenous To OR  . tranexamic acid  2 mg/kg Intracatheter To OR  . [MAR Hold] traZODone  50 mg Oral QHS  . [MAR Hold] vitamin B-12  2,500 mcg Oral Daily   Continuous Infusions: . [MAR Hold] sodium chloride    . cefUROXime (ZINACEF)  IV    . cefUROXime (ZINACEF)  IV    . dexmedetomidine    . DOPamine    . epinephrine    . heparin 30,000 units/NS 1000 mL solution for CELLSAVER    . insulin (NOVOLIN-R) infusion    . nitroGLYCERIN    . phenylephrine /294mL NS (0.08mg /ml) infusion    . tranexamic acid (CYKLOKAPRON) infusion ([facility])    . vancomycin     PRN Meds: [MAR Hold] sodium chloride, [MAR Hold] acetaminophen, [MAR Hold] ALPRAZolam, [MAR Hold] fluticasone, [MAR Hold] nitroGLYCERIN, [MAR Hold] ondansetron (ZOFRAN) IV, [MAR Hold] sodium chloride flush, temazepam   Vital Signs    Vitals:   03/01/17 1600 03/01/17 1949 03/02/17 0025 03/02/17 0439  BP: (!) 167/75  (!) 150/67 135/68 (!) 154/75  Pulse: 61 (!) 57 (!) 54 (!) 53  Resp: Temp:  98.1 F (36.7 C) 98 F (36.7 C) 97.7 F (36.5 C)  TempSrc:  Oral Oral Oral  SpO2: 98% 94% 97% 98%  Weight:    206 lb 1.6 oz (93.5 kg)  Height:        Intake/Output Summary (Last 24 hours) at 03/02/17 0731 Last data filed at 03/02/17 0300  Gross per 24 hour  Intake              100 ml  Output                0 ml  Net              100 ml   Filed Weights   03/01/17 1228 03/02/17 0439  Weight: 217 lb (98.4 kg) 206 lb 1.6 oz (93.5 kg)    Telemetry    NSR 03/02/2017  - Personally Reviewed  ECG    SB normal  - Personally Reviewed  Physical Exam  Right radial A post cath site  GEN: No acute distress.   Neck: No JVD Cardiac: RRR, no murmurs, rubs, or gallops.  Respiratory: Clear  to auscultation bilaterally. GI: Soft, nontender, non-distended  MS: No edema; No deformity. Neuro:  Nonfocal  Psych: Normal affect  Post B- fem pop   Labs    Chemistry Recent Labs Lab 02/27/17 1632 03/01/17 2320  NA 140 139  K 4.7 3.9  CL 99 103  CO2 24 25  GLUCOSE 94 96  BUN 24 21*  CREATININE 1.33* 1.26*  CALCIUM 9.1 8.9  PROT  --  7.1  ALBUMIN  --  4.1  AST  --  18  ALT  --  14*  ALKPHOS  --  29*  BILITOT  --  0.6  GFRNONAA 56* 59*  GFRAA 65 >60  ANIONGAP  --  11     Hematology Recent Labs Lab 02/27/17 1632  WBC 8.8  RBC 4.33  HGB 12.9*  HCT 36.7*  MCV 85  MCH 29.8  MCHC 35.1  RDW 13.7  PLT 375    Cardiac EnzymesNo results for input(s): TROPONINI in the last 168 hours. No results for input(s): TROPIPOC in the last 168 hours.   BNPNo results for input(s): BNP, PROBNP in the last 168 hours.   DDimer No results for input(s): DDIMER in the last 168 hours.   Radiology    Dg Chest Port 1 View  Result Date: 03/01/2017 CLINICAL DATA:  Preop CABG. EXAM: PORTABLE CHEST 1 VIEW COMPARISON:  07/05/2007 FINDINGS: Lungs are adequately inflated without consolidation or effusion.  Cardiomediastinal silhouette is within normal. There is calcified plaque over the aortic arch. There are degenerative changes of the spine. IMPRESSION: No acute cardiopulmonary disease. Aortic Atherosclerosis (ICD10-I70.0). Electronically Signed   By: Elberta Fortis M.D.   On: 03/01/2017 21:17    Cardiac Studies   Cath 03/01/17 70% ostial LM patent stents in mid circ and mid LAD occluded RCA with left to right collaterals   Patient Profile     61 y.o. male CAD previous stents admitted with unstable angina found to have LM disease for CABG 03/02/17  Assessment & Plan    1) CAD:  CABG this am EF normal  2) PVD:  Pre CABG dopplers ok post b- fem pop grafting    For questions or updates, please contact [facility] Please consult [email or web address] for contact info under Cardiology/STEMI.      Signed, Charlton Haws, MD  03/02/2017, 7:31 AM

## 2017-03-02 NOTE — Progress Notes (Signed)
Patient ID: Derrick [name], male   DOB: [birth year], 61 y.o.   MRN: [id number] EVENING ROUNDS NOTE :     [address]       [place] [address]             [phone]                 Day of Surgery Procedure(s) (LRB): CORONARY ARTERY BYPASS GRAFTING (CABG), ON PUMP, TIMES TWO, USING BILATERAL MAMMARY ARTERIES (N/A) TRANSESOPHAGEAL ECHOCARDIOGRAM (TEE) (N/A)  Total Length of Stay:  LOS: 1 day  BP 108/78   Pulse 80   Temp 98.8 F (37.1 C)   Resp 14   Ht  (1.88 m)   Wt 206 lb 1.6 oz (93.5 kg) Comment: scale a  SpO2 99%   BMI 26.46 kg/m   .Intake/Output      10/03 0701 - 10/04 0700 10/04 0701 - 10/05 0700   P.O. 100    I.V. (mL/kg) 0 (0) 2578.2 (27.6)   Blood  300   IV Piggyback 0 450   Total Intake(mL/kg) 100 (1.1) 3328.2 (35.6)   Urine (mL/kg/hr)  1565 (1.4)   Emesis/NG output  60   Blood  500   Chest Tube  222   Total Output   2347   Net +100 +981.2        Urine Occurrence 1 x      . sodium chloride Stopped (03/02/17 1435)  . [START ON 03/03/2017] sodium chloride    . sodium chloride 20 mL/hr at 03/02/17 1600  . albumin human    . cefUROXime (ZINACEF)  IV 1.5 g (03/02/17 1839)  . dexmedetomidine (PRECEDEX) IV infusion Stopped (03/02/17 1700)  . famotidine (PEPCID) IV Stopped (03/02/17 1514)  . insulin (NOVOLIN-R) infusion 0.6 Units/hr (03/02/17 1752)  . lactated ringers    . lactated ringers 20 mL/hr at 03/02/17 1600  . lactated ringers Stopped (03/02/17 1700)  . magnesium sulfate 4 g (03/02/17 1451)  . nitroGLYCERIN Stopped (03/02/17 1614)  . phenylephrine (NEO-SYNEPHRINE) Adult infusion 15 mcg/min (03/02/17 1625)  . vancomycin       Lab Results  Component Value Date   WBC 13.8 (H) 03/02/2017   HGB 9.9 (L) 03/02/2017   HCT 29.0 (L) 03/02/2017   PLT 211 03/02/2017   GLUCOSE 92 03/02/2017   ALT 14 (L) 03/01/2017   AST 18 03/01/2017   NA 142 03/02/2017   K 3.6 03/02/2017   CL 103 03/02/2017   CREATININE 1.00 03/02/2017   BUN 21  (H) 03/02/2017   CO2 25 03/02/2017   INR 1.32 03/02/2017   HGBA1C 5.4 03/01/2017   Now extubated, alert and orientated  Not bleeding   Delight Ovens MD  Beeper [phone] Office [phone] 03/02/2017 6:52 PM

## 2017-03-02 NOTE — Progress Notes (Signed)
RT NOTE:  Pt started on BIPAP following ABG post Cardiac extubation. IPAP: 14 / EPAP: 6 & FiO2 50%, RR 16. RN aware. RT will monitor.

## 2017-03-02 NOTE — Brief Op Note (Signed)
03/01/2017 - 03/02/2017  1:07 PM  PATIENT:  Derrick [name]  61 y.o. male  PRE-OPERATIVE DIAGNOSIS:  CABG  POST-OPERATIVE DIAGNOSIS:  CABG  PROCEDURE:  Procedure(s): CORONARY ARTERY BYPASS GRAFTING (CABG), ON PUMP, TIMES TWO, USING BILATERAL MAMMARY ARTERIES (N/A) TRANSESOPHAGEAL ECHOCARDIOGRAM (TEE) (N/A)  SURGEON:  Surgeon(s) and Role:    * Loreli Slot, MD - Primary  PHYSICIAN ASSISTANT: Jonne Rote PA-C  ANESTHESIA:   general  EBL:  Total I/O In: -  Out: 805 [Urine:805]  BLOOD ADMINISTERED:none  DRAINS: 2 CHEST TUBES   LOCAL MEDICATIONS USED:  NONE  SPECIMEN:  No Specimen  DISPOSITION OF SPECIMEN:  N/A  COUNTS:  YES  TOURNIQUET:  * No tourniquets in log *  DICTATION: .Other Dictation: Dictation Number [id number]  PLAN OF CARE: Admit to inpatient   PATIENT DISPOSITION:  ICU - intubated and hemodynamically stable.   Delay start of Pharmacological VTE agent (>24hrs) due to surgical blood loss or risk of bleeding: yes  COMPLICATIONS: NO KNOWN

## 2017-03-02 NOTE — Progress Notes (Signed)
  Echocardiogram Echocardiogram Transesophageal has been performed.  Leta Jungling M 03/02/2017, 8:51 AM

## 2017-03-03 ENCOUNTER — Encounter (HOSPITAL_COMMUNITY): Payer: Self-pay | Admitting: Thoracic Surgery (Cardiothoracic Vascular Surgery)

## 2017-03-03 ENCOUNTER — Inpatient Hospital Stay (HOSPITAL_COMMUNITY): Payer: BLUE CROSS/BLUE SHIELD

## 2017-03-03 LAB — POCT I-STAT, CHEM 8
BUN: 18 mg/dL (ref 6–20)
BUN: 19 mg/dL (ref 6–20)
Calcium, Ion: 0.95 mmol/L — ABNORMAL LOW (ref 1.15–1.40)
Calcium, Ion: 1 mmol/L — ABNORMAL LOW (ref 1.15–1.40)
Chloride: 100 mmol/L — ABNORMAL LOW (ref 101–111)
Chloride: 98 mmol/L — ABNORMAL LOW (ref 101–111)
Creatinine, Ser: 1.1 mg/dL (ref 0.61–1.24)
Creatinine, Ser: 1.3 mg/dL — ABNORMAL HIGH (ref 0.61–1.24)
Glucose, Bld: 171 mg/dL — ABNORMAL HIGH (ref 65–99)
Glucose, Bld: 180 mg/dL — ABNORMAL HIGH (ref 65–99)
HCT: 31 % — ABNORMAL LOW (ref 39.0–52.0)
HCT: 34 % — ABNORMAL LOW (ref 39.0–52.0)
Hemoglobin: 10.5 g/dL — ABNORMAL LOW (ref 13.0–17.0)
Hemoglobin: 11.6 g/dL — ABNORMAL LOW (ref 13.0–17.0)
Potassium: 3.8 mmol/L (ref 3.5–5.1)
Potassium: 3.8 mmol/L (ref 3.5–5.1)
Sodium: 137 mmol/L (ref 135–145)
Sodium: 136 mmol/L (ref 135–145)
TCO2: 24 mmol/L (ref 22–32)
TCO2: 25 mmol/L (ref 22–32)

## 2017-03-03 LAB — CBC
HCT: 34.3 % — ABNORMAL LOW (ref 39.0–52.0)
HCT: 33 % — ABNORMAL LOW (ref 39.0–52.0)
Hemoglobin: 11 g/dL — ABNORMAL LOW (ref 13.0–17.0)
Hemoglobin: 10.7 g/dL — ABNORMAL LOW (ref 13.0–17.0)
MCH: 28.8 pg (ref 26.0–34.0)
MCH: 29.2 pg (ref 26.0–34.0)
MCHC: 32.1 g/dL (ref 30.0–36.0)
MCHC: 32.4 g/dL (ref 30.0–36.0)
MCV: 89.8 fL (ref 78.0–100.0)
MCV: 90.2 fL (ref 78.0–100.0)
Platelets: 246 10*3/uL (ref 150–400)
Platelets: 240 10*3/uL (ref 150–400)
RBC: 3.82 MIL/uL — ABNORMAL LOW (ref 4.22–5.81)
RBC: 3.66 MIL/uL — ABNORMAL LOW (ref 4.22–5.81)
RDW: 13.2 % (ref 11.5–15.5)
RDW: 13.3 % (ref 11.5–15.5)
WBC: 17.8 10*3/uL — ABNORMAL HIGH (ref 4.0–10.5)
WBC: 18.5 10*3/uL — ABNORMAL HIGH (ref 4.0–10.5)

## 2017-03-03 LAB — POCT I-STAT 3, ART BLOOD GAS (G3+)
Acid-base deficit: 2 mmol/L (ref 0.0–2.0)
Acid-base deficit: 4 mmol/L — ABNORMAL HIGH (ref 0.0–2.0)
Bicarbonate: 24.2 mmol/L (ref 20.0–28.0)
Bicarbonate: 22.7 mmol/L (ref 20.0–28.0)
O2 Saturation: 98 %
O2 Saturation: 98 %
Patient temperature: 35.6
Patient temperature: 37.8
TCO2: 26 mmol/L (ref 22–32)
TCO2: 24 mmol/L (ref 22–32)
pCO2 arterial: 42.4 mmHg (ref 32.0–48.0)
pCO2 arterial: 46.7 mmHg (ref 32.0–48.0)
pH, Arterial: 7.358 (ref 7.350–7.450)
pH, Arterial: 7.298 — ABNORMAL LOW (ref 7.350–7.450)
pO2, Arterial: 113 mmHg — ABNORMAL HIGH (ref 83.0–108.0)
pO2, Arterial: 112 mmHg — ABNORMAL HIGH (ref 83.0–108.0)

## 2017-03-03 LAB — CREATININE, SERUM
Creatinine, Ser: 1.33 mg/dL — ABNORMAL HIGH (ref 0.61–1.24)
GFR calc Af Amer: >60 mL/min (ref >60)
GFR calc non Af Amer: 55 mL/min — ABNORMAL LOW (ref >60)

## 2017-03-03 LAB — BASIC METABOLIC PANEL
Anion gap: 12 (ref 5–15)
BUN: 16 mg/dL (ref 6–20)
CO2: 22 mmol/L (ref 22–32)
Calcium: 6.8 mg/dL — ABNORMAL LOW (ref 8.9–10.3)
Chloride: 101 mmol/L (ref 101–111)
Creatinine, Ser: 1.22 mg/dL (ref 0.61–1.24)
GFR calc Af Amer: >60 mL/min (ref >60)
GFR calc non Af Amer: >60 mL/min (ref >60)
Glucose, Bld: 113 mg/dL — ABNORMAL HIGH (ref 65–99)
Potassium: 3.9 mmol/L (ref 3.5–5.1)
Sodium: 135 mmol/L (ref 135–145)

## 2017-03-03 LAB — GLUCOSE, CAPILLARY
Glucose-Capillary: 106 mg/dL — ABNORMAL HIGH (ref 65–99)
Glucose-Capillary: 111 mg/dL — ABNORMAL HIGH (ref 65–99)
Glucose-Capillary: 114 mg/dL — ABNORMAL HIGH (ref 65–99)
Glucose-Capillary: 120 mg/dL — ABNORMAL HIGH (ref 65–99)
Glucose-Capillary: 126 mg/dL — ABNORMAL HIGH (ref 65–99)
Glucose-Capillary: 128 mg/dL — ABNORMAL HIGH (ref 65–99)
Glucose-Capillary: 129 mg/dL — ABNORMAL HIGH (ref 65–99)
Glucose-Capillary: 179 mg/dL — ABNORMAL HIGH (ref 65–99)
Glucose-Capillary: 99 mg/dL (ref 65–99)

## 2017-03-03 LAB — MAGNESIUM
Magnesium: 1.9 mg/dL (ref 1.7–2.4)
Magnesium: 2 mg/dL (ref 1.7–2.4)

## 2017-03-03 LAB — HEMOGLOBIN AND HEMATOCRIT, BLOOD
HCT: 26.5 % — ABNORMAL LOW (ref 39.0–52.0)
Hemoglobin: 9.1 g/dL — ABNORMAL LOW (ref 13.0–17.0)

## 2017-03-03 MED ORDER — ENOXAPARIN SODIUM 40 MG/0.4ML ~~LOC~~ SOLN
40.0000 mg | Freq: Every day | SUBCUTANEOUS | Status: DC
  Administered 2017-03-03 – 2017-03-07 (×5): 40 mg via SUBCUTANEOUS
  Filled 2017-03-03 (×5): qty 0.4

## 2017-03-03 MED ORDER — CALCITRIOL 0.25 MCG PO CAPS
0.5000 ug | ORAL_CAPSULE | Freq: Every day | ORAL | Status: DC
  Administered 2017-03-03 – 2017-03-08 (×6): 0.5 ug via ORAL
  Filled 2017-03-03 (×6): qty 2

## 2017-03-03 MED ORDER — INSULIN ASPART 100 UNIT/ML ~~LOC~~ SOLN
0.0000 [IU] | SUBCUTANEOUS | Status: DC
  Administered 2017-03-03: 4 [IU] via SUBCUTANEOUS
  Administered 2017-03-03 – 2017-03-04 (×3): 2 [IU] via SUBCUTANEOUS

## 2017-03-03 MED ORDER — LOSARTAN POTASSIUM 50 MG PO TABS
100.0000 mg | ORAL_TABLET | Freq: Every evening | ORAL | Status: DC
  Administered 2017-03-03 – 2017-03-05 (×3): 100 mg via ORAL
  Filled 2017-03-03 (×3): qty 2

## 2017-03-03 MED ORDER — EZETIMIBE 10 MG PO TABS
10.0000 mg | ORAL_TABLET | Freq: Every day | ORAL | Status: DC
  Administered 2017-03-03 – 2017-03-08 (×6): 10 mg via ORAL
  Filled 2017-03-03 (×6): qty 1

## 2017-03-03 MED ORDER — FLUTICASONE PROPIONATE 50 MCG/ACT NA SUSP: 2.0000 | Freq: Every day | NASAL | Status: DC | PRN

## 2017-03-03 MED ORDER — FENOFIBRATE 160 MG PO TABS
160.0000 mg | ORAL_TABLET | Freq: Every day | ORAL | Status: DC
  Administered 2017-03-03 – 2017-03-08 (×6): 160 mg via ORAL
  Filled 2017-03-03 (×6): qty 1

## 2017-03-03 MED ORDER — CALCIUM CARBONATE 1250 (500 CA) MG PO TABS
500.0000 mg | ORAL_TABLET | Freq: Two times a day (BID) | ORAL | Status: DC
  Administered 2017-03-03 – 2017-03-07 (×9): 500 mg via ORAL
  Filled 2017-03-03 (×11): qty 1

## 2017-03-03 MED ORDER — GABAPENTIN 300 MG PO CAPS
300.0000 mg | ORAL_CAPSULE | Freq: Every evening | ORAL | Status: DC
  Administered 2017-03-03 – 2017-03-07 (×5): 300 mg via ORAL
  Filled 2017-03-03 (×5): qty 1

## 2017-03-03 MED ORDER — TRAZODONE HCL 50 MG PO TABS
50.0000 mg | ORAL_TABLET | Freq: Every day | ORAL | Status: DC
  Administered 2017-03-03 – 2017-03-07 (×5): 50 mg via ORAL
  Filled 2017-03-03 (×5): qty 1

## 2017-03-03 MED ORDER — OMEGA-3-ACID ETHYL ESTERS 1 G PO CAPS
2000.0000 mg | ORAL_CAPSULE | Freq: Two times a day (BID) | ORAL | Status: DC
  Administered 2017-03-03 – 2017-03-08 (×11): 2000 mg via ORAL
  Filled 2017-03-03 (×11): qty 2

## 2017-03-03 MED FILL — Mannitol IV Soln 20%: INTRAVENOUS | Qty: 500 | Status: AC

## 2017-03-03 MED FILL — Sodium Bicarbonate IV Soln 8.4%: INTRAVENOUS | Qty: 50 | Status: AC

## 2017-03-03 MED FILL — Sodium Chloride IV Soln 0.9%: INTRAVENOUS | Qty: 2000 | Status: AC

## 2017-03-03 MED FILL — Lidocaine HCl IV Inj 20 MG/ML: INTRAVENOUS | Qty: 5 | Status: AC

## 2017-03-03 MED FILL — Lidocaine HCl Local Inj 2%: INTRAMUSCULAR | Qty: 10 | Status: AC

## 2017-03-03 MED FILL — Electrolyte-R (PH 7.4) Solution: INTRAVENOUS | Qty: 3000 | Status: AC

## 2017-03-03 NOTE — Discharge Summary (Signed)
Physician Discharge Summary  Patient ID: Derrick [name] MRN: [id number] DOB/AGE: Apr 16, 1956 61 y.o.  Admit date: 03/01/2017 Discharge date: 03/08/2017  Admission Diagnoses:  1. New onset class III angina 2. History of coronary artery disease (s/p PCI, stents)  Active Diagnoses:  1. Hyperlipidemia-statin intolerant 2. PAD (peripheral artery disease) (HCC) 3. Closed fracture of shaft of metacarpal bone(s) 4. Right rotator cuff tear 5. GERD (gastroesophageal reflux disease) 6. Hypertension 7. Hyperparathyroidism 8. OA (osteoarthritis) 9. Seasonal allergies 10. Post op atrial fibrillation-converted to sinus rhythm   HPI: 62 yo man with a past history of CAD (2 stents in 2006), PAD(bilateral fem-pop), hyperlipidemia, hypertension, remote tobacco abuse, osteoarthritis, hyperparathyroidism, multiple previous neck surgeries.  He presents with a 4 week Hx of chest discomfort with exertion. Says it feels like his chest is being pulled from both sides. relieved with rest. On Saturday had a prolonged episode and decided to see a physician.  Today he had cardiac catheterization. It revealed a chronically totally occluded RCA and a new 70% ostial left main stenosis. Both stents patent. EF normal  He was seen in cardiothoracic surgical consultation by Charlett Lango M.D. who evaluated the patient and studies and agree with recommendations to proceed with coronary artery surgical revascularization.  Discharged Condition: Stable and discharged to home.  Hospital Course: Patient was admitted and underwent cardiac catheterization where he was found to have severe disease as described above. He was then scheduled for CABG and on 03/02/2017 he underwent the below described procedure. He tolerated well was taken to the surgical intensive care unit in stable condition.  Postoperative hospital course:  The patient is progressing nicely. He was weaned from ventilator without difficulty using  standard protocols. All routine lines, monitors and drainage devices have been discontinued over time. He has been started on aspirin beta blocker as well as ARB. He has a mild expected acute blood loss anemia which is stabilized. Blood sugars are being control using standard measures. His hemoglobin A1c is 5.4. Incision is healing well without evidence of infection. He is tolerating gradually increasing activities using standard protocols. He went into atrial fibrillation rhythm and was put on Amiodarone and Cardizem drips. He later converted to sinus rhythm.  His pacing wires have been removed without difficulty. Oxygen has been weaned and he maintains good saturations on room air.  He had some mild diarrhea after receiving laxative.  He is ambulating independently.  His pain is well controlled.  He is felt medically stable for discharge home today.   Consults: cardiology  Significant Diagnostic Studies: angiography: cardiac cath and routine post op labs/serial CXR's  Treatments: surgery:  DATE OF PROCEDURE:  03/02/2017 DATE OF DISCHARGE:                              OPERATIVE REPORT   PREOPERATIVE DIAGNOSIS:  Left main and 3-vessel coronary artery disease with unstable angina.  POSTOPERATIVE DIAGNOSIS:  Left main and 3-vessel coronary artery disease with unstable angina.  PROCEDURES:  Median sternotomy, extracorporeal circulation, coronary artery bypass grafting x2 (left internal mammary artery to LAD, Y-graft right mammary from left mammary to OM).  SURGEON:  Jimi Schappert. Dorris Fetch, MD.  ASSISTANT:  Gershon Crane, PA-C.  ANESTHESIA:  General.  Disposition: 01-Home or Self Care  Discharge Instructions    Amb Referral to Cardiac Rehabilitation    Complete by:  As directed    Diagnosis:  CABG   CABG X ___:  2  Allergies as of 03/08/2017      Reactions   Atorvastatin    Muscle aches   Codeine Itching   Crestor [rosuvastatin Calcium]    Muscle aches   Meloxicam  Other (See Comments)   Can not take due to kidneys   Statins Other (See Comments)   myalgia      Medication List    TAKE these medications   amiodarone 400 MG tablet Commonly known as:  PACERONE Take 1 tablet (400 mg total) by mouth 2 (two) times daily. For 7 days, then decrease to 400 mg daily   aspirin 325 MG EC tablet Take 1 tablet (325 mg total) by mouth daily. What changed:  medication strength  how much to take   calcitRIOL 0.5 MCG capsule Commonly known as:  ROCALTROL Take 0.5 mcg by mouth daily.   calcium carbonate 1500 (600 Ca) MG Tabs tablet Commonly known as:  OSCAL Take 600 mg of elemental calcium by mouth 2 (two) times daily with a meal.   diltiazem 120 MG 24 hr capsule Commonly known as:  CARDIZEM CD Take 1 capsule (120 mg total) by mouth daily.   ezetimibe 10 MG tablet Commonly known as:  ZETIA Take 10 mg by mouth daily.   fenofibrate 160 MG tablet Take 160 mg by mouth daily.   fluticasone 50 MCG/ACT nasal spray Commonly known as:  FLONASE Place 2 sprays into both nostrils daily as needed for allergies or rhinitis.   gabapentin 300 MG capsule Commonly known as:  NEURONTIN Take 300 mg by mouth every evening.   losartan 100 MG tablet Commonly known as:  COZAAR Take 100 mg by mouth every evening.   metoprolol tartrate 25 MG tablet Commonly known as:  LOPRESSOR Take 1 tablet (25 mg total) by mouth 2 (two) times daily.   nitroGLYCERIN 0.4 MG SL tablet Commonly known as:  NITROSTAT Place 1 tablet (0.4 mg total) under the tongue every 5 (five) minutes as needed for chest pain.   OMEGA-3 EPA FISH OIL PO Take 3,210 mg by mouth 2 (two) times daily.   ranitidine 150 MG tablet Commonly known as:  ZANTAC Take 150 mg by mouth 2 (two) times daily.   traMADol 50 MG tablet Commonly known as:  ULTRAM Take 1 tablet (50 mg total) by mouth every 4 (four) hours as needed for moderate pain.   traZODone 50 MG tablet Commonly known as:  DESYREL Take 50  mg by mouth at bedtime.   TURMERIC PO Take by mouth.   TYLENOL ARTHRITIS PAIN 650 MG CR tablet Generic drug:  acetaminophen Take 1,300 mg by mouth 2 (two) times daily.   Vitamin B-12 2500 MCG Subl Place 2,500 mcg under the tongue daily.      Follow-up Information    Kathleene Hazel, MD Follow up.   Specialty:  Cardiology Why:  A 2 week cardiology appointment is being arranged. It is not listed in your discharge paperwork please contact the office for date and time. Contact information: [address] [place] [address] [phone]        Loreli Slot, MD Follow up.   Specialty:  Cardiothoracic Surgery Why:  Appointment see the surgeon on 04/04/2017 at 10 AM. Please obtain a chest x-ray from [facility] imaging at 9:30 AM. [facility] imaging is located in the same office complex on the first floor. Contact information: [address] [employer] [address] [place] [address] [phone]  The patient has been discharged on:   1.Beta Blocker:  Yes [ y  ]                              No   [   ]                              If No, reason:  2.Ace Inhibitor/ARB: Yes [place]   ]                                     No  [    ]                                     If No, reason:  3.Statin:   Yes [   ]                  No  [ n  ]                  If No, reason:on zetia and omega 3 fish oil d/t statin allergy  4.Marlowe KaysValentino Hue  [ y  ]                  No   [   ]                  If No, reason:  Signed: BARRETT, ERIN 03/08/2017, 8:51 AM

## 2017-03-03 NOTE — Progress Notes (Signed)
1 Day Post-Op Procedure(s) (LRB): CORONARY ARTERY BYPASS GRAFTING (CABG), ON PUMP, TIMES TWO, USING BILATERAL MAMMARY ARTERIES (N/A) TRANSESOPHAGEAL ECHOCARDIOGRAM (TEE) (N/A) Subjective: Some pain earlier has eased off now  Objective: Vital signs in last 24 hours: Temp:  [96.1 F (35.6 C)-100 F (37.8 C)] 99.5 F (37.5 C) (10/05 0800) Pulse Rate:  [64-93] 70 (10/05 0800) Cardiac Rhythm: Normal sinus rhythm (10/05 0000) Resp:  [10-29] 18 (10/05 0800) BP: (90-176)/(61-79) 133/61 (10/05 0800) SpO2:  [97 %-100 %] 100 % (10/05 0800) Arterial Line BP: (107-227)/(49-88) 185/60 (10/05 0800) FiO2 (%):  [40 %-50 %] 50 % (10/04 2057) Weight:  [211 lb 14.4 oz (96.1 kg)] 211 lb 14.4 oz (96.1 kg) (10/05 0500)  Hemodynamic parameters for last 24 hours: PAP: (12-47)/(5-17) 36/16 CO:  [4.3 L/min-5.4 L/min] 5.4 L/min CI:  [1.9 L/min/m2-2.5 L/min/m2] 2.4 L/min/m2  Intake/Output from previous day: 10/04 0701 - 10/05 0700 In: 4255.6 [P.O.:120; I.V.:3075.6; Blood:300; IV Piggyback:750] Out: 3592 [Urine:2540; Emesis/NG output:60; Blood:500; Chest Tube:492] Intake/Output this shift: No intake/output data recorded.  General appearance: alert, cooperative and no distress Neurologic: intact Heart: regular rate and rhythm Lungs: diminished breath sounds bibasilar Abdomen: normal findings: soft, non-tender  Lab Results:  Recent Labs  03/02/17 2021 03/02/17 2039 03/03/17 0533  WBC 16.9*  --  17.8*  HGB 10.5* 10.5* 11.0*  HCT 32.4* 31.0* 34.3*  PLT 261  --  246   BMET:  Recent Labs  03/02/17 0613  03/02/17 2039 03/03/17 0533  NA 137  < > 136 135  K 3.7  < > 4.0 3.9  CL 101  < > 103 101  CO2 25  --   --  22  GLUCOSE 85  < > 129* 113*  BUN 19  < > 20 16  CREATININE 1.14  < > 1.10 1.22  CALCIUM 8.6*  --   --  6.8*  < > = values in this interval not displayed.  PT/INR:  Recent Labs  03/02/17 1431  LABPROT 16.2*  INR 1.32   ABG    Component Value Date/Time   PHART 7.298 (L)  03/03/2017 0542   HCO3 22.7 03/03/2017 0542   TCO2 24 03/03/2017 0542   ACIDBASEDEF 4.0 (H) 03/03/2017 0542   O2SAT 98.0 03/03/2017 0542   CBG (last 3)   Recent Labs  03/03/17 0218 03/03/17 0321 03/03/17 0722  GLUCAP 111* 106* 128*    Assessment/Plan: S/P Procedure(s) (LRB): CORONARY ARTERY BYPASS GRAFTING (CABG), ON PUMP, TIMES TWO, USING BILATERAL MAMMARY ARTERIES (N/A) TRANSESOPHAGEAL ECHOCARDIOGRAM (TEE) (N/A) -  Doing well CV- s/p CABG x 2  ASA, beta blocker, restart cozaar  Statin intolerance- resume zetia and fish oil  Dc swan and A line  RESP- IS for basilar atelectasis  RENAL- creatinine and lytes Ok  ENDO- CBG well controlled on q4 CBG/ SSI  Anemia secondary to ABL- mild, follow  DC chest tubes  Mobilize   LOS: 2 days    Derrick [name] 03/03/2017

## 2017-03-03 NOTE — Discharge Instructions (Signed)
Coronary Artery Bypass Grafting, Care After °These instructions give you information on caring for yourself after your procedure. Your doctor may also give you more specific instructions. Call your doctor if you have any problems or questions after your procedure. °Follow these instructions at home: °· Only take medicine as told by your doctor. Take medicines exactly as told. Do not stop taking medicines or start any new medicines without talking to your doctor first. °· Take your pulse as told by your doctor. °· Do deep breathing as told by your doctor. Use your breathing device (incentive spirometer), if given, to practice deep breathing several times a day. Support your chest with a pillow or your arms when you take deep breaths or cough. °· Keep the area clean, dry, and protected where the surgery cuts (incisions) were made. Remove bandages (dressings) only as told by your doctor. If strips were applied to surgical area, do not take them off. They fall off on their own. °· Check the surgery area daily for puffiness (swelling), redness, or leaking fluid. °· If surgery cuts were made in your legs: °? Avoid crossing your legs. °? Avoid sitting for long periods of time. Change positions every 30 minutes. °? Raise your legs when you are sitting. Place them on pillows. °· Wear stockings that help keep blood clots from forming in your legs (compression stockings). °· Only take sponge baths until your doctor says it is okay to take showers. Pat the surgery area dry. Do not rub the surgery area with a washcloth or towel. Do not bathe, swim, or use a hot tub until your doctor says it is okay. °· Eat foods that are high in fiber. These include raw fruits and vegetables, whole grains, beans, and nuts. Choose lean meats. Avoid canned, processed, and fried foods. °· Drink enough fluids to keep your pee (urine) clear or pale yellow. °· Weigh yourself every day. °· Rest and limit activity as told by your doctor. You may be told  to: °? Stop any activity if you have chest pain, shortness of breath, changes in heartbeat, or dizziness. Get help right away if this happens. °? Move around often for short amounts of time or take short walks as told by your doctor. Gradually become more active. You may need help to strengthen your muscles and build endurance. °? Avoid lifting, pushing, or pulling anything heavier than 10 pounds (4.5 kg) for at least 6 weeks after surgery. °· Do not drive until your doctor says it is okay. °· Ask your doctor when you can go back to work. °· Ask your doctor when you can begin sexual activity again. °· Follow up with your doctor as told. °Contact a doctor if: °· You have puffiness, redness, more pain, or fluid draining from the incision site. °· You have a fever. °· You have puffiness in your ankles or legs. °· You have pain in your legs. °· You gain 2 or more pounds (0.9 kg) a day. °· You feel sick to your stomach (nauseous) or throw up (vomit). °· You have watery poop (diarrhea). °Get help right away if: °· You have chest pain that goes to your jaw or arms. °· You have shortness of breath. °· You have a fast or irregular heartbeat. °· You notice a "clicking" in your breastbone when you move. °· You have numbness or weakness in your arms or legs. °· You feel dizzy or light-headed. °This information is not intended to replace advice given to you by   your health care provider. Make sure you discuss any questions you have with your health care provider. °Document Released: 05/21/2013 Document Revised: 10/22/2015 Document Reviewed: 10/23/2012 °Elsevier Interactive Patient Education © 2017 [employer] ° °

## 2017-03-03 NOTE — Care Management Note (Addendum)
Case Management Note  Patient Details  Name: Derrick [name] MRN: [id number] Date of Birth: 1955-12-09  Subjective/Objective:   From home with wife, POD 1 CABG, cont diuresing, dc chest tubes, mobilize.  Wife works but patient states she will be able to take off work and be home with him for a while.   10/8 0912 POD 4 CABG  With post op afib, conts to be at controlled rate, amio and diltiazem changed to po, may need a NOAC if afib persists, NCM awaiting benefit check for xarelto and eliquis.  Patient to transfer to SDU.             Action/Plan: NCM will follow for dc needs.  Expected Discharge Date:                  Expected Discharge Plan:  Home/Self Care  In-House Referral:     Discharge planning Services     Post Acute Care Choice:    Choice offered to:     DME Arranged:    DME Agency:     HH Arranged:    HH Agency:     Status of Service:  In process, will continue to follow  If discussed at Long Length of Stay Meetings, dates discussed:    Additional Comments:  Leone Haven, RN 03/03/2017, 11:10 AM

## 2017-03-04 ENCOUNTER — Inpatient Hospital Stay (HOSPITAL_COMMUNITY): Payer: BLUE CROSS/BLUE SHIELD

## 2017-03-04 LAB — CBC
HCT: 29.8 % — ABNORMAL LOW (ref 39.0–52.0)
Hemoglobin: 9.8 g/dL — ABNORMAL LOW (ref 13.0–17.0)
MCH: 29.6 pg (ref 26.0–34.0)
MCHC: 32.9 g/dL (ref 30.0–36.0)
MCV: 90 fL (ref 78.0–100.0)
Platelets: 202 10*3/uL (ref 150–400)
RBC: 3.31 MIL/uL — ABNORMAL LOW (ref 4.22–5.81)
RDW: 13.3 % (ref 11.5–15.5)
WBC: 15.8 10*3/uL — ABNORMAL HIGH (ref 4.0–10.5)

## 2017-03-04 LAB — BASIC METABOLIC PANEL
Anion gap: 8 (ref 5–15)
BUN: 17 mg/dL (ref 6–20)
CO2: 26 mmol/L (ref 22–32)
Calcium: 7.2 mg/dL — ABNORMAL LOW (ref 8.9–10.3)
Chloride: 99 mmol/L — ABNORMAL LOW (ref 101–111)
Creatinine, Ser: 1.29 mg/dL — ABNORMAL HIGH (ref 0.61–1.24)
GFR calc Af Amer: >60 mL/min (ref >60)
GFR calc non Af Amer: 57 mL/min — ABNORMAL LOW (ref >60)
Glucose, Bld: 108 mg/dL — ABNORMAL HIGH (ref 65–99)
Potassium: 4 mmol/L (ref 3.5–5.1)
Sodium: 133 mmol/L — ABNORMAL LOW (ref 135–145)

## 2017-03-04 LAB — GLUCOSE, CAPILLARY
Glucose-Capillary: 104 mg/dL — ABNORMAL HIGH (ref 65–99)
Glucose-Capillary: 105 mg/dL — ABNORMAL HIGH (ref 65–99)
Glucose-Capillary: 117 mg/dL — ABNORMAL HIGH (ref 65–99)
Glucose-Capillary: 134 mg/dL — ABNORMAL HIGH (ref 65–99)

## 2017-03-04 MED ORDER — POTASSIUM CHLORIDE CRYS ER 20 MEQ PO TBCR
20.0000 meq | EXTENDED_RELEASE_TABLET | Freq: Two times a day (BID) | ORAL | Status: DC
  Administered 2017-03-04 – 2017-03-08 (×9): 20 meq via ORAL
  Filled 2017-03-04 (×9): qty 1

## 2017-03-04 MED ORDER — PHENOL 1.4 % MT LIQD
1.0000 | OROMUCOSAL | Status: DC | PRN
  Administered 2017-03-04: 1 via OROMUCOSAL
  Filled 2017-03-04: qty 177

## 2017-03-04 MED ORDER — INSULIN ASPART 100 UNIT/ML ~~LOC~~ SOLN
0.0000 [IU] | Freq: Three times a day (TID) | SUBCUTANEOUS | Status: DC
  Administered 2017-03-04 – 2017-03-06 (×6): 2 [IU] via SUBCUTANEOUS

## 2017-03-04 MED ORDER — SODIUM CHLORIDE 0.9 % IV SOLN: 250.0000 mL | INTRAVENOUS | Status: DC | PRN

## 2017-03-04 MED ORDER — SODIUM CHLORIDE 0.9% FLUSH: 3.0000 mL | INTRAVENOUS | Status: DC | PRN

## 2017-03-04 MED ORDER — SODIUM CHLORIDE 0.9% FLUSH
3.0000 mL | Freq: Two times a day (BID) | INTRAVENOUS | Status: DC
  Administered 2017-03-04: 3 mL via INTRAVENOUS
  Administered 2017-03-04 – 2017-03-05 (×2): 10 mL via INTRAVENOUS
  Administered 2017-03-05 – 2017-03-07 (×5): 3 mL via INTRAVENOUS

## 2017-03-04 MED ORDER — MAGNESIUM HYDROXIDE 400 MG/5ML PO SUSP: 30.0000 mL | Freq: Every day | ORAL | Status: DC | PRN

## 2017-03-04 MED ORDER — FUROSEMIDE 40 MG PO TABS
40.0000 mg | ORAL_TABLET | Freq: Every day | ORAL | Status: DC
  Administered 2017-03-04 – 2017-03-08 (×5): 40 mg via ORAL
  Filled 2017-03-04 (×5): qty 1

## 2017-03-04 MED ORDER — MOVING RIGHT ALONG BOOK
Freq: Once | Status: AC
  Administered 2017-03-04: 09:00:00
  Filled 2017-03-04: qty 1

## 2017-03-04 MED ORDER — INFLUENZA VAC SPLIT QUAD 0.5 ML IM SUSY
0.5000 mL | PREFILLED_SYRINGE | INTRAMUSCULAR | Status: AC
  Administered 2017-03-06: 0.5 mL via INTRAMUSCULAR
  Filled 2017-03-04: qty 0.5

## 2017-03-04 NOTE — Plan of Care (Signed)
Problem: Activity: Goal: Risk for activity intolerance will decrease Outcome: Progressing Pt is ambulating in room and in hallway  Problem: Bowel/Gastric: Goal: Gastrointestinal status for postoperative course will improve Outcome: Progressing First BM since surgery today  Problem: Respiratory: Goal: Respiratory status will improve Outcome: Progressing Pt is consistently doing IS and making progress

## 2017-03-04 NOTE — Progress Notes (Signed)
2 Days Post-Op Procedure(s) (LRB): CORONARY ARTERY BYPASS GRAFTING (CABG), ON PUMP, TIMES TWO, USING BILATERAL MAMMARY ARTERIES (N/A) TRANSESOPHAGEAL ECHOCARDIOGRAM (TEE) (N/A) Subjective: Progressing after BIMA  Objective: Vital signs in last 24 hours: Temp:  [97.8 F (36.6 C)-100 F (37.8 C)] 98.7 F (37.1 C) (10/06 0700) Pulse Rate:  [67-83] 77 (10/06 0800) Cardiac Rhythm: Normal sinus rhythm (10/06 0430) Resp:  [11-31] 14 (10/06 0800) BP: (92-178)/(49-101) 126/65 (10/06 0800) SpO2:  [78 %-100 %] 95 % (10/06 0800) Arterial Line BP: (176-256)/(57-80) 190/65 (10/05 1130) Weight:  [215 lb 6.2 oz (97.7 kg)] 215 lb 6.2 oz (97.7 kg) (10/06 0500)  Hemodynamic parameters for last 24 hours: PAP: (34-54)/(11-19) 43/19  Intake/Output from previous day: 10/05 0701 - 10/06 0700 In: 445 [P.O.:180; I.V.:165; IV Piggyback:100] Out: 1075 [Urine:1025; Chest Tube:50] Intake/Output this shift: No intake/output data recorded.       Exam    General- alert and comfortable   Lungs- clear without rales, wheezes   Cor- regular rate and rhythm, no murmur , gallop   Abdomen- soft, non-tender   Extremities - warm, non-tender, minimal edema   Neuro- oriented, appropriate, no focal weakness   Lab Results:  Recent Labs  03/03/17 2034 03/03/17 2041 03/04/17 0435  WBC 18.5*  --  15.8*  HGB 10.7* 11.6* 9.8*  HCT 33.0* 34.0* 29.8*  PLT 240  --  202   BMET:  Recent Labs  03/03/17 0533  03/03/17 2041 03/04/17 0435  NA 135  < > 136 133*  K 3.9  < > 3.8 4.0  CL 101  < > 98* 99*  CO2 22  --   --  26  GLUCOSE 113*  < > 180* 108*  BUN 16  < > 19 17  CREATININE 1.22  < > 1.30* 1.29*  CALCIUM 6.8*  --   --  7.2*  < > = values in this interval not displayed.  PT/INR:  Recent Labs  03/02/17 1431  LABPROT 16.2*  INR 1.32   ABG    Component Value Date/Time   PHART 7.298 (L) 03/03/2017 0542   HCO3 22.7 03/03/2017 0542   TCO2 25 03/03/2017 2041   ACIDBASEDEF 4.0 (H) 03/03/2017 0542    O2SAT 98.0 03/03/2017 0542   CBG (last 3)   Recent Labs  03/03/17 1930 03/03/17 2335 03/04/17 0437  GLUCAP 179* 134* 117*    Assessment/Plan: S/P Procedure(s) (LRB): CORONARY ARTERY BYPASS GRAFTING (CABG), ON PUMP, TIMES TWO, USING BILATERAL MAMMARY ARTERIES (N/A) TRANSESOPHAGEAL ECHOCARDIOGRAM (TEE) (N/A) Mobilize Diuresis Diabetes control Plan for transfer to step-down: see transfer orders   LOS: 3 days    Derrick [name] 03/04/2017

## 2017-03-04 NOTE — Progress Notes (Signed)
CT surgery p.m. Rounds  Up in chair Had stable day with several walks Maintaining sinus rhythm Waiting for bed to transfer to telemetry

## 2017-03-04 NOTE — Plan of Care (Signed)
Problem: Activity: Goal: Risk for activity intolerance will decrease Outcome: Progressing Patient ambulating halls with walker and minimal assistance. Does require breaks during walk but tolerating activity well.  Problem: Bowel/Gastric: Goal: Gastrointestinal status for postoperative course will improve Outcome: Progressing Passing gas. Able to tolerate drinks/meals with no nausea/vomiting.  Problem: Pain Management: Goal: Pain level will decrease Outcome: Progressing No complaints of pain. No PRNs administered.  Problem: Urinary Elimination: Goal: Ability to achieve and maintain adequate renal perfusion and functioning will improve Outcome: Completed/Met Date Met: 03/04/17 Urinary catheter discontinued. Able to urinate post catheter removal with no issues.

## 2017-03-05 ENCOUNTER — Inpatient Hospital Stay (HOSPITAL_COMMUNITY): Payer: BLUE CROSS/BLUE SHIELD

## 2017-03-05 DIAGNOSIS — Z951 Presence of aortocoronary bypass graft: Secondary | ICD-10-CM

## 2017-03-05 LAB — CBC
HCT: 27.9 % — ABNORMAL LOW (ref 39.0–52.0)
Hemoglobin: 9.1 g/dL — ABNORMAL LOW (ref 13.0–17.0)
MCH: 29.1 pg (ref 26.0–34.0)
MCHC: 32.6 g/dL (ref 30.0–36.0)
MCV: 89.1 fL (ref 78.0–100.0)
Platelets: 205 10*3/uL (ref 150–400)
RBC: 3.13 MIL/uL — ABNORMAL LOW (ref 4.22–5.81)
RDW: 13.4 % (ref 11.5–15.5)
WBC: 13 10*3/uL — ABNORMAL HIGH (ref 4.0–10.5)

## 2017-03-05 LAB — TYPE AND SCREEN
ABO/RH(D): O POS
Antibody Screen: NEGATIVE
Unit division: 0
Unit division: 0

## 2017-03-05 LAB — BASIC METABOLIC PANEL
Anion gap: 8 (ref 5–15)
BUN: 18 mg/dL (ref 6–20)
CO2: 25 mmol/L (ref 22–32)
Calcium: 7.3 mg/dL — ABNORMAL LOW (ref 8.9–10.3)
Chloride: 102 mmol/L (ref 101–111)
Creatinine, Ser: 1.15 mg/dL (ref 0.61–1.24)
GFR calc Af Amer: >60 mL/min (ref >60)
GFR calc non Af Amer: >60 mL/min (ref >60)
Glucose, Bld: 108 mg/dL — ABNORMAL HIGH (ref 65–99)
Potassium: 4.5 mmol/L (ref 3.5–5.1)
Sodium: 135 mmol/L (ref 135–145)

## 2017-03-05 LAB — BPAM RBC
Blood Product Expiration Date: 201810252359
Blood Product Expiration Date: 201810252359
ISSUE DATE / TIME: 201810040942
ISSUE DATE / TIME: 201810040942
Unit Type and Rh: 5100
Unit Type and Rh: 5100

## 2017-03-05 LAB — GLUCOSE, CAPILLARY
Glucose-Capillary: 154 mg/dL — ABNORMAL HIGH (ref 65–99)
Glucose-Capillary: 112 mg/dL — ABNORMAL HIGH (ref 65–99)
Glucose-Capillary: 149 mg/dL — ABNORMAL HIGH (ref 65–99)
Glucose-Capillary: 133 mg/dL — ABNORMAL HIGH (ref 65–99)

## 2017-03-05 MED ORDER — AMIODARONE HCL IN DEXTROSE 360-4.14 MG/200ML-% IV SOLN
60.0000 mg/h | INTRAVENOUS | Status: AC
  Administered 2017-03-05 (×2): 60 mg/h via INTRAVENOUS
  Filled 2017-03-05: qty 400

## 2017-03-05 MED ORDER — AMIODARONE HCL IN DEXTROSE 360-4.14 MG/200ML-% IV SOLN
30.0000 mg/h | INTRAVENOUS | Status: AC
  Administered 2017-03-05: 30 mg/h via INTRAVENOUS
  Filled 2017-03-05: qty 200

## 2017-03-05 MED ORDER — AMIODARONE IV BOLUS ONLY 150 MG/100ML
150.0000 mg | Freq: Once | INTRAVENOUS | Status: AC
  Administered 2017-03-05: 150 mg via INTRAVENOUS

## 2017-03-05 MED ORDER — DILTIAZEM HCL 100 MG IV SOLR
5.0000 mg/h | INTRAVENOUS | Status: DC
  Administered 2017-03-05: 5 mg/h via INTRAVENOUS
  Filled 2017-03-05: qty 100

## 2017-03-05 NOTE — Plan of Care (Signed)
Problem: Respiratory: Goal: Respiratory status will improve Outcome: Progressing Pt with productive cough tonight, encouraging coughing and deep breathing

## 2017-03-05 NOTE — Progress Notes (Signed)
CT surgery p.m. Rounds  Patient with persistent atrial fibrillation 100-1 10/m Able to walk with stable blood pressure We'll start low-dose IV Cardizem for chemical cardioversion

## 2017-03-05 NOTE — Progress Notes (Signed)
   Has cough. Chest exam is unremarkable.  No arrhythmia.

## 2017-03-05 NOTE — Progress Notes (Signed)
3 Days Post-Op Procedure(s) (LRB): CORONARY ARTERY BYPASS GRAFTING (CABG), ON PUMP, TIMES TWO, USING BILATERAL MAMMARY ARTERIES (N/A) TRANSESOPHAGEAL ECHOCARDIOGRAM (TEE) (N/A) Subjective: Dev fib this am  100-110 Iv amio started Otherwise doing well cxr clear Objective: Vital signs in last 24 hours: Temp:  [98.1 F (36.7 C)-99.4 F (37.4 C)] 99.4 F (37.4 C) (10/07 0730) Pulse Rate:  [77-105] 105 (10/07 0947) Cardiac Rhythm: Normal sinus rhythm (10/07 0730) Resp:  [11-25] 19 (10/07 0730) BP: (89-151)/(59-77) 133/67 (10/07 0947) SpO2:  [89 %-100 %] 93 % (10/07 0730) Weight:  [212 lb 1.3 oz (96.2 kg)] 212 lb 1.3 oz (96.2 kg) (10/07 0600)  Hemodynamic parameters for last 24 hours:  afebrile  Intake/Output from previous day: 10/06 0701 - 10/07 0700 In: 1280 [P.O.:1280] Out: 2960 [Urine:2960] Intake/Output this shift: Total I/O In: 240 [P.O.:240] Out: -        Exam    General- alert and comfortable   Lungs- clear without rales, wheezes   Cor- regular rate and rhythm, no murmur , gallop   Abdomen- soft, non-tender   Extremities - warm, non-tender, minimal edema   Neuro- oriented, appropriate, no focal weakness   Lab Results:  Recent Labs  03/04/17 0435 03/05/17 0251  WBC 15.8* 13.0*  HGB 9.8* 9.1*  HCT 29.8* 27.9*  PLT 202 205   BMET:  Recent Labs  03/04/17 0435 03/05/17 0251  NA 133* 135  K 4.0 4.5  CL 99* 102  CO2 26 25  GLUCOSE 108* 108*  BUN 17 18  CREATININE 1.29* 1.15  CALCIUM 7.2* 7.3*    PT/INR:  Recent Labs  03/02/17 1431  LABPROT 16.2*  INR 1.32   ABG    Component Value Date/Time   PHART 7.298 (L) 03/03/2017 0542   HCO3 22.7 03/03/2017 0542   TCO2 25 03/03/2017 2041   ACIDBASEDEF 4.0 (H) 03/03/2017 0542   O2SAT 98.0 03/03/2017 0542   CBG (last 3)   Recent Labs  03/04/17 0801 03/04/17 1710 03/05/17 0814  GLUCAP 104* 105* 154*    Assessment/Plan: S/P Procedure(s) (LRB): CORONARY ARTERY BYPASS GRAFTING (CABG), ON PUMP,  TIMES TWO, USING BILATERAL MAMMARY ARTERIES (N/A) TRANSESOPHAGEAL ECHOCARDIOGRAM (TEE) (N/A)   LOS: 4 days  Start iv amio Keep on ICU  Derrick [name] 03/05/2017

## 2017-03-06 DIAGNOSIS — Z736 Limitation of activities due to disability: Secondary | ICD-10-CM

## 2017-03-06 DIAGNOSIS — E782 Mixed hyperlipidemia: Secondary | ICD-10-CM

## 2017-03-06 DIAGNOSIS — I481 Persistent atrial fibrillation: Secondary | ICD-10-CM

## 2017-03-06 LAB — BASIC METABOLIC PANEL
Anion gap: 11 (ref 5–15)
BUN: 26 mg/dL — ABNORMAL HIGH (ref 6–20)
CO2: 28 mmol/L (ref 22–32)
Calcium: 7.7 mg/dL — ABNORMAL LOW (ref 8.9–10.3)
Chloride: 96 mmol/L — ABNORMAL LOW (ref 101–111)
Creatinine, Ser: 1.38 mg/dL — ABNORMAL HIGH (ref 0.61–1.24)
GFR calc Af Amer: >60 mL/min (ref >60)
GFR calc non Af Amer: 53 mL/min — ABNORMAL LOW (ref >60)
Glucose, Bld: 127 mg/dL — ABNORMAL HIGH (ref 65–99)
Potassium: 4.2 mmol/L (ref 3.5–5.1)
Sodium: 135 mmol/L (ref 135–145)

## 2017-03-06 LAB — GLUCOSE, CAPILLARY
Glucose-Capillary: 117 mg/dL — ABNORMAL HIGH (ref 65–99)
Glucose-Capillary: 149 mg/dL — ABNORMAL HIGH (ref 65–99)
Glucose-Capillary: 114 mg/dL — ABNORMAL HIGH (ref 65–99)
Glucose-Capillary: 107 mg/dL — ABNORMAL HIGH (ref 65–99)
Glucose-Capillary: 125 mg/dL — ABNORMAL HIGH (ref 65–99)

## 2017-03-06 LAB — CBC
HCT: 27.1 % — ABNORMAL LOW (ref 39.0–52.0)
Hemoglobin: 8.8 g/dL — ABNORMAL LOW (ref 13.0–17.0)
MCH: 28.9 pg (ref 26.0–34.0)
MCHC: 32.5 g/dL (ref 30.0–36.0)
MCV: 89.1 fL (ref 78.0–100.0)
Platelets: 246 10*3/uL (ref 150–400)
RBC: 3.04 MIL/uL — ABNORMAL LOW (ref 4.22–5.81)
RDW: 13.6 % (ref 11.5–15.5)
WBC: 11.3 10*3/uL — ABNORMAL HIGH (ref 4.0–10.5)

## 2017-03-06 MED ORDER — METOPROLOL TARTRATE 25 MG/10 ML ORAL SUSPENSION
25.0000 mg | Freq: Two times a day (BID) | ORAL | Status: DC
  Filled 2017-03-06: qty 10

## 2017-03-06 MED ORDER — LOSARTAN POTASSIUM 50 MG PO TABS
50.0000 mg | ORAL_TABLET | Freq: Every evening | ORAL | Status: DC
  Administered 2017-03-06 – 2017-03-07 (×2): 50 mg via ORAL
  Filled 2017-03-06 (×2): qty 1

## 2017-03-06 MED ORDER — AMIODARONE HCL 200 MG PO TABS
400.0000 mg | ORAL_TABLET | Freq: Two times a day (BID) | ORAL | Status: DC
  Administered 2017-03-06 – 2017-03-08 (×5): 400 mg via ORAL
  Filled 2017-03-06 (×5): qty 2

## 2017-03-06 MED ORDER — DILTIAZEM HCL ER COATED BEADS 120 MG PO CP24
120.0000 mg | ORAL_CAPSULE | Freq: Every day | ORAL | Status: DC
  Administered 2017-03-06 – 2017-03-08 (×3): 120 mg via ORAL
  Filled 2017-03-06 (×3): qty 1

## 2017-03-06 MED ORDER — METOPROLOL TARTRATE 25 MG PO TABS
25.0000 mg | ORAL_TABLET | Freq: Two times a day (BID) | ORAL | Status: DC
  Administered 2017-03-06 – 2017-03-08 (×5): 25 mg via ORAL
  Filled 2017-03-06 (×5): qty 1

## 2017-03-06 NOTE — Progress Notes (Signed)
Progress Note  Patient Name: Derrick [name] Date of Encounter: 03/06/2017  Primary Cardiologist: Yazmyne Sara   Subjective   61 yo with recently  CAD  Hx of stenting in the past Now, s/p recent CABG   Has post op Afib   Inpatient Medications    Scheduled Meds: . acetaminophen  1,000 mg Oral Q6H   Or  . acetaminophen (TYLENOL) oral liquid 160 mg/5 mL  1,000 mg Per Tube Q6H  . amiodarone  400 mg Oral BID  . aspirin EC  325 mg Oral Daily   Or  . aspirin  324 mg Per Tube Daily  . bisacodyl  10 mg Oral Daily   Or  . bisacodyl  10 mg Rectal Daily  . calcitRIOL  0.5 mcg Oral Daily  . calcium carbonate  500 mg of elemental calcium Oral BID WC  . diltiazem  120 mg Oral Daily  . docusate sodium  200 mg Oral Daily  . enoxaparin (LOVENOX) injection  40 mg Subcutaneous QHS  . ezetimibe  10 mg Oral Daily  . fenofibrate  160 mg Oral Daily  . furosemide  40 mg Oral Daily  . gabapentin  300 mg Oral QPM  . Influenza vac split quadrivalent PF  0.5 mL Intramuscular Tomorrow-1000  . insulin aspart  0-24 Units Subcutaneous TID AC & HS  . losartan  50 mg Oral QPM  . metoprolol tartrate  25 mg Oral BID   Or  . metoprolol tartrate  25 mg Per Tube BID  . mupirocin ointment  1 application Nasal BID  . omega-3 acid ethyl esters  2,000 mg Oral BID  . pantoprazole  40 mg Oral Daily  . potassium chloride  20 mEq Oral BID  . sodium chloride flush  3 mL Intravenous Q12H  . sodium chloride flush  3 mL Intravenous Q12H  . traZODone  50 mg Oral QHS   Continuous Infusions: . sodium chloride    . sodium chloride    . amiodarone 30 mg/hr (03/06/17 0400)  . diltiazem (CARDIZEM) infusion 5 mg/hr (03/06/17 0400)  . phenylephrine (NEO-SYNEPHRINE) Adult infusion Stopped (03/02/17 2105)   PRN Meds: sodium chloride, fluticasone, magnesium hydroxide, metoprolol tartrate, ondansetron (ZOFRAN) IV, oxyCODONE, phenol, sodium chloride flush, sodium chloride flush, traMADol   Vital Signs    Vitals:   03/06/17 0500 03/06/17 0600 03/06/17 0730 03/06/17 0736  BP: 124/74  130/71   Pulse: 82 76    Resp: Temp:    98.4 F (36.9 C)  TempSrc:    Oral  SpO2: 91% 97% 98%   Weight:  209 lb (94.8 kg)    Height:        Intake/Output Summary (Last 24 hours) at 03/06/17 0809 Last data filed at 03/06/17 0730  Gross per 24 hour  Intake          2281.17 ml  Output             2750 ml  Net          -468.83 ml   Filed Weights   03/04/17 0500 03/05/17 0600 03/06/17 0600  Weight: 215 lb 6.2 oz (97.7 kg) 212 lb 1.3 oz (96.2 kg) 209 lb (94.8 kg)    Telemetry    Atrial fib with controlled rate  - Personally Reviewed  ECG    Atrial fib  - Personally Reviewed  Physical Exam   GEN:  middle age man,  S/p CABG , No acute distress.  Neck: No JVD Cardiac: Irreg. Irreg. Marland Kitchen  Respiratory: Clear to auscultation bilaterally. GI: Soft, nontender, non-distended  MS: No edema; No deformity. Neuro:  Nonfocal  Psych: Normal affect   Labs    Chemistry Recent Labs Lab 03/01/17 2320  03/04/17 0435 03/05/17 0251 03/06/17 0245  NA 139  < > 133* 135 135  K 3.9  < > 4.0 4.5 4.2  CL 103  < > 99* 102 96*  CO2 25  < > GLUCOSE 96  < > 108* 108* 127*  BUN 21*  < > 17 18 26*  CREATININE 1.26*  < > 1.29* 1.15 1.38*  CALCIUM 8.9  < > 7.2* 7.3* 7.7*  PROT 7.1  --   --   --   --   ALBUMIN 4.1  --   --   --   --   AST 18  --   --   --   --   ALT 14*  --   --   --   --   ALKPHOS 29*  --   --   --   --   BILITOT 0.6  --   --   --   --   GFRNONAA 59*  < > 57* >60 53*  GFRAA >60  < > >60 >60 >60  ANIONGAP 11  < > < > = values in this interval not displayed.   Hematology Recent Labs Lab 03/04/17 0435 03/05/17 0251 03/06/17 0245  WBC 15.8* 13.0* 11.3*  RBC 3.31* 3.13* 3.04*  HGB 9.8* 9.1* 8.8*  HCT 29.8* 27.9* 27.1*  MCV 90.0 89.1 89.1  MCH 29.6 29.1 28.9  MCHC 32.9 32.6 32.5  RDW 13.3 13.4 13.6  PLT 202 205 246    Cardiac EnzymesNo results for input(s):  TROPONINI in the last 168 hours. No results for input(s): TROPIPOC in the last 168 hours.   BNPNo results for input(s): BNP, PROBNP in the last 168 hours.   DDimer No results for input(s): DDIMER in the last 168 hours.   Radiology    Dg Chest 2 View  Result Date: 03/05/2017 CLINICAL DATA:  History CABG EXAM: CHEST  2 VIEW COMPARISON:  03/04/2017 FINDINGS: Interval removal of the right jugular central venous catheter. Bibasilar hazy airspace disease likely reflecting atelectasis. No pneumothorax. Small left pleural effusion. Stable cardiomediastinal silhouette. Changes from recent CABG. No acute osseous abnormality. IMPRESSION: 1. Interval removal of the right jugular central venous catheter. No pneumothorax. 2. Mild bibasilar atelectasis.  Small left pleural effusion. Electronically Signed   By: Elige Ko   On: 03/05/2017 07:27    Cardiac Studies      Patient Profile     61 y.o. male CAD  hyperlipidemai   Assessment & Plan    1.   CAD - s/p CABG , doing well. Has post op AF . Is on amio drip.  To change to PO amio today  He is on Low dose Lovenox.   We should consider increasing the Lovenox dose to the theraputic dose or starting DOAC. Will defer to Dr. Dorris Fetch as to the timing of that change   2.  Hyperlipidemia :   Continue zetia, fenofibrate. Intolerant to statins.   Will need to consider PCSK9 inh.   3. Atrial fib:  Continue amio. Will need to consider increasing the Lovenox to theraputic dosing at some point vs. Starting DOAC .   Will defer to TCTS as to the timing  of that change.     For questions or updates, please contact [facility] Please consult [email or web address] for contact info under Cardiology/STEMI.      Signed, Kristeen Miss, MD  03/06/2017, 8:09 AM

## 2017-03-06 NOTE — Progress Notes (Signed)
4 Days Post-Op Procedure(s) (LRB): CORONARY ARTERY BYPASS GRAFTING (CABG), ON PUMP, TIMES TWO, USING BILATERAL MAMMARY ARTERIES (N/A) TRANSESOPHAGEAL ECHOCARDIOGRAM (TEE) (N/A) Subjective: No complaints this AM  Objective: Vital signs in last 24 hours: Temp:  [97.8 F (36.6 C)-99.4 F (37.4 C)] 97.8 F (36.6 C) (10/08 0400) Pulse Rate:  [69-133] 76 (10/08 0600) Cardiac Rhythm: Atrial fibrillation (10/07 2000) Resp:  [14-30] 20 (10/08 0600) BP: (85-133)/(57-97) 124/74 (10/08 0500) SpO2:  [86 %-100 %] 97 % (10/08 0600) Weight:  [209 lb (94.8 kg)] 209 lb (94.8 kg) (10/08 0600)  Hemodynamic parameters for last 24 hours:    Intake/Output from previous day: 10/07 0701 - 10/08 0700 In: 2019.5 [P.O.:1440; I.V.:579.5] Out: 2750 [Urine:2750] Intake/Output this shift: No intake/output data recorded.  General appearance: alert, cooperative and no distress Neurologic: intact Heart: irregularly irregular rhythm Lungs: clear to auscultation bilaterally Abdomen: normal findings: soft, non-tender  Lab Results:  Recent Labs  03/05/17 0251 03/06/17 0245  WBC 13.0* 11.3*  HGB 9.1* 8.8*  HCT 27.9* 27.1*  PLT 205 246   BMET:  Recent Labs  03/05/17 0251 03/06/17 0245  NA 135 135  K 4.5 4.2  CL 102 96*  CO2 25 28  GLUCOSE 108* 127*  BUN 18 26*  CREATININE 1.15 1.38*  CALCIUM 7.3* 7.7*    PT/INR: No results for input(s): LABPROT, INR in the last 72 hours. ABG    Component Value Date/Time   PHART 7.298 (L) 03/03/2017 0542   HCO3 22.7 03/03/2017 0542   TCO2 25 03/03/2017 2041   ACIDBASEDEF 4.0 (H) 03/03/2017 0542   O2SAT 98.0 03/03/2017 0542   CBG (last 3)   Recent Labs  03/05/17 1145 03/05/17 1548 03/05/17 2105  GLUCAP 112* 149* 133*    Assessment/Plan: S/P Procedure(s) (LRB): CORONARY ARTERY BYPASS GRAFTING (CABG), ON PUMP, TIMES TWO, USING BILATERAL MAMMARY ARTERIES (N/A) TRANSESOPHAGEAL ECHOCARDIOGRAM (TEE) (N/A) Plan for transfer to step-down: see  transfer orders  CV- in rate controlled atrial fib on amiodarone and diltiazem drips  Change to PO amio and diltiazem  Increase metoprolol to 25 BID  On low dose lovenox, may need a NOAC if a fib persists  RESP- continue IS  RENAL- creatinine up slightly- follow  ENDO- CBG well controlled  Continue cardiac rehab   LOS: 5 days    Derrick [name] 03/06/2017

## 2017-03-06 NOTE — Progress Notes (Signed)
CARDIAC REHAB PHASE I   PRE:  Rate/Rhythm: 84 a fib  BP:  Sitting: 118/81        SaO2: 96 RA  MODE:  Ambulation: 110 ft   POST:  Rate/Rhythm: 96 a fib  BP:  Sitting: 109/73         SaO2: 98 RA  Pt ambulated 110 ft on RA, rolling walker, assist x1, slow, steady gait, tolerated well. Pt c/o generalized weakness, anxious about using the RW, denies any other complaints, declined rest stop. Encouraged IS, additional ambulation x1 today. Pt to recliner after walk, call bell within reach. Will follow.   [phone] Joylene Grapes, RN, BSN 03/06/2017 12:06 PM

## 2017-03-07 LAB — BASIC METABOLIC PANEL
Anion gap: 14 (ref 5–15)
BUN: 28 mg/dL — ABNORMAL HIGH (ref 6–20)
CO2: 27 mmol/L (ref 22–32)
Calcium: 7.7 mg/dL — ABNORMAL LOW (ref 8.9–10.3)
Chloride: 96 mmol/L — ABNORMAL LOW (ref 101–111)
Creatinine, Ser: 1.27 mg/dL — ABNORMAL HIGH (ref 0.61–1.24)
GFR calc Af Amer: >60 mL/min (ref >60)
GFR calc non Af Amer: 58 mL/min — ABNORMAL LOW (ref >60)
Glucose, Bld: 110 mg/dL — ABNORMAL HIGH (ref 65–99)
Potassium: 4 mmol/L (ref 3.5–5.1)
Sodium: 137 mmol/L (ref 135–145)

## 2017-03-07 LAB — GLUCOSE, CAPILLARY: Glucose-Capillary: 121 mg/dL — ABNORMAL HIGH (ref 65–99)

## 2017-03-07 MED ORDER — LACTULOSE 10 GM/15ML PO SOLN
20.0000 g | Freq: Once | ORAL | Status: AC
  Administered 2017-03-07: 20 g via ORAL
  Filled 2017-03-07: qty 30

## 2017-03-07 MED ORDER — GUAIFENESIN ER 600 MG PO TB12
600.0000 mg | ORAL_TABLET | Freq: Two times a day (BID) | ORAL | Status: DC
  Administered 2017-03-07 – 2017-03-08 (×3): 600 mg via ORAL
  Filled 2017-03-07 (×3): qty 1

## 2017-03-07 MED FILL — Potassium Chloride Inj 2 mEq/ML: INTRAVENOUS | Qty: 40 | Status: AC

## 2017-03-07 MED FILL — Heparin Sodium (Porcine) Inj 1000 Unit/ML: INTRAMUSCULAR | Qty: 30 | Status: AC

## 2017-03-07 MED FILL — Magnesium Sulfate Inj 50%: INTRAMUSCULAR | Qty: 10 | Status: AC

## 2017-03-07 NOTE — Progress Notes (Signed)
Patient ambulated approximately 500 feet in hallway with front wheeled walker without difficulty.  Will continue to monitor.

## 2017-03-07 NOTE — Progress Notes (Signed)
#  2.  S/W TARA @ [facility] # [id number]   1. ELIQUIS 2.5 MG BID  COVER- YES  CO-PAY- $ 102.89 Q/L TWO PER DAY  PRIOR APPROVAL- NO    2. ELIQUIS 5 MG BID  COVER- YES  CO-PAY- $ 102.89 Q/ L TWO PILL PER DAY  PRIOR APPROVAL- NO   3. XARELTO 15 MG BID  COVER- YES  CO-PAY- $ 200.00  PRIOR APPROVAL- NO   4. XARELTO 20 MG DAILY  COVER- YES  CO-PAY- $ 200.00  PRIOR APPROVAL- NO   DEDUCTIBLE- NOT MET

## 2017-03-07 NOTE — Progress Notes (Signed)
Epicardial pacing wires removed without difficulty as per MD orders.  Will continue to monitor.

## 2017-03-07 NOTE — Progress Notes (Addendum)
      [address]       [place] [address]             [phone]        5 Days Post-Op Procedure(s) (LRB): CORONARY ARTERY BYPASS GRAFTING (CABG), ON PUMP, TIMES TWO, USING BILATERAL MAMMARY ARTERIES (N/A) TRANSESOPHAGEAL ECHOCARDIOGRAM (TEE) (N/A)  Subjective: Patient with constipation and cough;otherwise, no complaint.  Objective: Vital signs in last 24 hours: Temp:  [98.1 F (36.7 C)-98.3 F (36.8 C)] 98.1 F (36.7 C) (10/08 2100) Pulse Rate:  [70-83] 83 (10/08 2100) Cardiac Rhythm: Atrial fibrillation (10/08 1920) Resp:  [17-19] 18 (10/08 2100) BP: (109-148)/(64-73) 109/64 (10/08 2100) SpO2:  [96 %-98 %] 96 % (10/08 2100) Weight:  [204 lb 6.4 oz (92.7 kg)] 204 lb 6.4 oz (92.7 kg) (10/09 0500)  Pre op weight 93.5 kg Current Weight  03/07/17 204 lb 6.4 oz (92.7 kg)      Intake/Output from previous day: 10/08 0701 - 10/09 0700 In: 480 [P.O.:480] Out: -    Physical Exam:  Cardiovascular: RRR Pulmonary: Clear to auscultation bilaterally; Abdomen: Soft, non tender, bowel sounds present. Extremities: Mild bilateral lower extremity edema. Wounds: Clean and dry.  No erythema or signs of infection.  Lab Results: CBC: Recent Labs  03/05/17 0251 03/06/17 0245  WBC 13.0* 11.3*  HGB 9.1* 8.8*  HCT 27.9* 27.1*  PLT 205 246   BMET:  Recent Labs  03/06/17 0245 03/07/17 0352  NA 135 137  K 4.2 4.0  CL 96* 96*  CO2 28 27  GLUCOSE 127* 110*  BUN 26* 28*  CREATININE 1.38* 1.27*  CALCIUM 7.7* 7.7*    PT/INR:  Lab Results  Component Value Date   INR 1.32 03/02/2017   INR 1.02 03/01/2017   INR 1.0 02/27/2017   ABG:  INR: Will add last result for INR, ABG once components are confirmed Will add last 4 CBG results once components are confirmed  Assessment/Plan:  1. CV - Previous A fib, converted to SR earlier this am. On Amiodarone 400 mg bid, Lopressor 25 mg bid, Losartan 50 Q pm, Cardizem CD 120 mg daily. On low dose Lovenox, but  will likely need anticoagulation-likely NOAC and will defer to Dr. Dorris Fetch. 2.  Pulmonary - On room air. Will give Mucinex at patient request. Encourage incentive spirometer. 3. Volume Overload - On Lasix 40 mg daily 4.  Acute blood loss anemia - Last H and H 8.8 and 27.1 5. Creatinine down from 1.38 to 1.27. 6. Remove EPW 7. LOC constipation 8. CBGs 114/107/125. Pre op HGA1C 5.4. Will stop accu checks and SS PRN. 9. Likely home in am  ZIMMERMAN,DONIELLE MPA-C 03/07/2017,7:39 AM Patient seen and examined, agree with above I don't think he needs a NOAC unless he has additional atrial fib Home in AM if rhythm stable  Alexey C. Dorris Fetch, MD [facility] [phone]

## 2017-03-07 NOTE — Progress Notes (Signed)
Patient ambulated 500 feet in the hallway with front wheeled walker without difficulty.  Will continue to monitor.

## 2017-03-07 NOTE — Progress Notes (Signed)
Progress Note  Patient Name: Derrick [name] Date of Encounter: 03/07/2017  Primary Cardiologist: Naher   Subjective   61 year old gentleman  with known history of coronary artery disease. He status post coronary artery bypass grafting. He had postoperative atrial fibrillation which is converted back to normal sinus rhythm. He's currently on amiodarone.   Inpatient Medications    Scheduled Meds: . acetaminophen  1,000 mg Oral Q6H   Or  . acetaminophen (TYLENOL) oral liquid 160 mg/5 mL  1,000 mg Per Tube Q6H  . amiodarone  400 mg Oral BID  . aspirin EC  325 mg Oral Daily   Or  . aspirin  324 mg Per Tube Daily  . bisacodyl  10 mg Oral Daily   Or  . bisacodyl  10 mg Rectal Daily  . calcitRIOL  0.5 mcg Oral Daily  . calcium carbonate  500 mg of elemental calcium Oral BID WC  . diltiazem  120 mg Oral Daily  . docusate sodium  200 mg Oral Daily  . enoxaparin (LOVENOX) injection  40 mg Subcutaneous QHS  . ezetimibe  10 mg Oral Daily  . fenofibrate  160 mg Oral Daily  . furosemide  40 mg Oral Daily  . gabapentin  300 mg Oral QPM  . guaiFENesin  600 mg Oral BID  . losartan  50 mg Oral QPM  . metoprolol tartrate  25 mg Oral BID   Or  . metoprolol tartrate  25 mg Per Tube BID  . mupirocin ointment  1 application Nasal BID  . omega-3 acid ethyl esters  2,000 mg Oral BID  . pantoprazole  40 mg Oral Daily  . potassium chloride  20 mEq Oral BID  . sodium chloride flush  3 mL Intravenous Q12H  . traZODone  50 mg Oral QHS   Continuous Infusions: . diltiazem (CARDIZEM) infusion 5 mg/hr (03/06/17 0400)   PRN Meds: fluticasone, magnesium hydroxide, metoprolol tartrate, ondansetron (ZOFRAN) IV, oxyCODONE, phenol, sodium chloride flush, traMADol   Vital Signs    Vitals:   03/06/17 1200 03/06/17 1324 03/06/17 2100 03/07/17 0500  BP: 109/73  109/64   Pulse:  70 83   Resp: Temp:  98.3 F (36.8 C) 98.1 F (36.7 C)   TempSrc:  Axillary Oral   SpO2:  96% 96%     Weight:    204 lb 6.4 oz (92.7 kg)  Height:        Intake/Output Summary (Last 24 hours) at 03/07/17 0912 Last data filed at 03/06/17 1000  Gross per 24 hour  Intake              240 ml  Output                0 ml  Net              240 ml   Filed Weights   03/05/17 0600 03/06/17 0600 03/07/17 0500  Weight: 212 lb 1.3 oz (96.2 kg) 209 lb (94.8 kg) 204 lb 6.4 oz (92.7 kg)    Telemetry    NSR  - Personally Reviewed  ECG     NSR  - Personally Reviewed  Physical Exam   GEN: No acute distress.   Neck: No JVD Cardiac: RRR, no murmurs, rubs, or gallops.   Chest wall tenderness  Respiratory: Clear to auscultation bilaterally. GI: Soft, nontender, non-distended  MS: No edema; No deformity. Neuro:  Nonfocal  Psych: Normal affect   Labs  Chemistry Recent Labs Lab 03/01/17 2320  03/05/17 0251 03/06/17 0245 03/07/17 0352  NA 139  < > 135 135 137  K 3.9  < > 4.5 4.2 4.0  CL 103  < > 102 96* 96*  CO2 25  < > GLUCOSE 96  < > 108* 127* 110*  BUN 21*  < > 18 26* 28*  CREATININE 1.26*  < > 1.15 1.38* 1.27*  CALCIUM 8.9  < > 7.3* 7.7* 7.7*  PROT 7.1  --   --   --   --   ALBUMIN 4.1  --   --   --   --   AST 18  --   --   --   --   ALT 14*  --   --   --   --   ALKPHOS 29*  --   --   --   --   BILITOT 0.6  --   --   --   --   GFRNONAA 59*  < > >60 53* 58*  GFRAA >60  < > >60 >60 >60  ANIONGAP 11  < > < > = values in this interval not displayed.   Hematology Recent Labs Lab 03/04/17 0435 03/05/17 0251 03/06/17 0245  WBC 15.8* 13.0* 11.3*  RBC 3.31* 3.13* 3.04*  HGB 9.8* 9.1* 8.8*  HCT 29.8* 27.9* 27.1*  MCV 90.0 89.1 89.1  MCH 29.6 29.1 28.9  MCHC 32.9 32.6 32.5  RDW 13.3 13.4 13.6  PLT 202 205 246    Cardiac EnzymesNo results for input(s): TROPONINI in the last 168 hours. No results for input(s): TROPIPOC in the last 168 hours.   BNPNo results for input(s): BNP, PROBNP in the last 168 hours.   DDimer No results for input(s): DDIMER in  the last 168 hours.   Radiology    No results found.  Cardiac Studies      Patient Profile     61 y.o. male s/p CABG   Assessment & Plan    1. Coronary artery disease: He status post coronary artery bypass grafting. He had some postoperative atrial fibrillation which is now resolved.  I would recommend that we discharge him on amiodarone for at least 3 months.  f he remains out of atrial fibrillation and I agree with no anticoagulation for now. I've advised him to check his heart rate regularly and to come see [place] for an urgent EKG if he feels that his heart rate is irregular.  He had his pacing wires pulled today. He should be discharged tomorrow. We will plan on seeing him in the office in several weeks.  For questions or updates, please contact [facility] Please consult [email or web address] for contact info under Cardiology/STEMI.      Signed, Kristeen Miss, MD  03/07/2017, 9:13 AM

## 2017-03-07 NOTE — Progress Notes (Signed)
CARDIAC REHAB PHASE I   PRE:  Rate/Rhythm:     BP: sitting 137/78    SaO2:   MODE:  Ambulation: 430 ft   POST:  Rate/Rhythm: 92 SR    BP: sitting 144/82     SaO2: 100 RA  Pt up walking with RW, wife and RN standing by. Steady, stronger today. Maintained NSR. Feels good. To recliner. Ed completed with pt and wife. Good understanding. Will send referral to [facility]. Encouraged x3 more walks today with RW. He has RW at home. Gave [facility] d/c video to watch. He is practicing IS.  [id number]   Harriet Masson CES, ACSM 03/07/2017 11:01 AM

## 2017-03-08 MED ORDER — TRAMADOL HCL 50 MG PO TABS: 50.0000 mg | ORAL_TABLET | ORAL | 0 refills | Status: DC | PRN

## 2017-03-08 MED ORDER — LOSARTAN POTASSIUM 50 MG PO TABS: 100.0000 mg | ORAL_TABLET | Freq: Every evening | ORAL | Status: DC

## 2017-03-08 MED ORDER — AMIODARONE HCL 400 MG PO TABS: 400.0000 mg | ORAL_TABLET | Freq: Two times a day (BID) | ORAL | 1 refills | Status: DC

## 2017-03-08 MED ORDER — ASPIRIN 325 MG PO TBEC: 325.0000 mg | DELAYED_RELEASE_TABLET | Freq: Every day | ORAL | 0 refills | Status: DC

## 2017-03-08 MED ORDER — DILTIAZEM HCL ER COATED BEADS 120 MG PO CP24: 120.0000 mg | ORAL_CAPSULE | Freq: Every day | ORAL | 3 refills | Status: DC

## 2017-03-08 MED ORDER — METOPROLOL TARTRATE 25 MG PO TABS: 25.0000 mg | ORAL_TABLET | Freq: Two times a day (BID) | ORAL | 3 refills | Status: DC

## 2017-03-08 NOTE — Progress Notes (Signed)
CT sutures removed per order and per protocol. Painted with tincture, steri-strips applied. D/c instructions reviewed with pt and wife. Scripts given.

## 2017-03-08 NOTE — Progress Notes (Signed)
      [address]       [place] [address]             [phone]      6 Days Post-Op Procedure(s) (LRB): CORONARY ARTERY BYPASS GRAFTING (CABG), ON PUMP, TIMES TWO, USING BILATERAL MAMMARY ARTERIES (N/A) TRANSESOPHAGEAL ECHOCARDIOGRAM (TEE) (N/A)   Subjective:  Derrick [name] feels weak and tired this morning.  He states he had some diarrhea after his stool softener yesterday.  He still wants to go home today.  Objective: Vital signs in last 24 hours: Temp:  [98.2 F (36.8 C)-98.3 F (36.8 C)] 98.2 F (36.8 C) (10/10 0418) Pulse Rate:  [74-78] 74 (10/10 0418) Cardiac Rhythm: Normal sinus rhythm;Bundle branch block (10/10 0711) BP: (154)/(84) 154/84 (10/09 1202) SpO2:  [97 %-98 %] 97 % (10/10 0418) Weight:  [201 lb 8 oz (91.4 kg)] 201 lb 8 oz (91.4 kg) (10/10 0420)  Intake/Output from previous day: 10/09 0701 - 10/10 0700 In: 250 [P.O.:250] Out: -   General appearance: alert, cooperative and no distress Heart: regular rate and rhythm Lungs: clear to auscultation bilaterally Abdomen: soft, non-tender; bowel sounds normal; no masses,  no organomegaly Extremities: edema none Wound: clean and dry  Lab Results:  Recent Labs  03/06/17 0245  WBC 11.3*  HGB 8.8*  HCT 27.1*  PLT 246   BMET:  Recent Labs  03/06/17 0245 03/07/17 0352  NA 135 137  K 4.2 4.0  CL 96* 96*  CO2 28 27  GLUCOSE 127* 110*  BUN 26* 28*  CREATININE 1.38* 1.27*  CALCIUM 7.7* 7.7*    PT/INR: No results for input(s): LABPROT, INR in the last 72 hours. ABG    Component Value Date/Time   PHART 7.298 (L) 03/03/2017 0542   HCO3 22.7 03/03/2017 0542   TCO2 25 03/03/2017 2041   ACIDBASEDEF 4.0 (H) 03/03/2017 0542   O2SAT 98.0 03/03/2017 0542   CBG (last 3)   Recent Labs  03/06/17 1318 03/06/17 1624 03/07/17 1655  GLUCAP 107* 125* 121*    Assessment/Plan: S/P Procedure(s) (LRB): CORONARY ARTERY BYPASS GRAFTING (CABG), ON PUMP, TIMES TWO, USING BILATERAL MAMMARY  ARTERIES (N/A) TRANSESOPHAGEAL ECHOCARDIOGRAM (TEE) (N/A)  1. CV- PAF, maintaining NSR, remains Hypertensive- increase Cozaar to home dose, continue Amiodarone, Lopressor 2. Pulm- no acute issues, continue IS 3. Renal- creatinine was trending down, weight is below admission... Will stop Lasix and potassium 4. GI- diarrhea after laxative use, stop all stool softeners 5. Dispo- patient stable, maintaining NSR, will d/c home today   LOS: 7 days    Elliemae Braman 03/08/2017

## 2017-03-08 NOTE — Care Management Note (Signed)
Case Management Note Original Note Created Leone Haven, RN 03/03/2017, 11:10 AM   Patient Details  Name: Derrick [name] MRN: [id number] Date of Birth: 1956/04/12  Subjective/Objective:   From home with wife, POD 1 CABG, cont diuresing, dc chest tubes, mobilize.  Wife works but patient states she will be able to take off work and be home with him for a while.   10/8 0912 POD 4 CABG  With post op afib, conts to be at controlled rate, amio and diltiazem changed to po, may need a NOAC if afib persists, NCM awaiting benefit check for xarelto and eliquis.  Patient to transfer to SDU.             Action/Plan: NCM will follow for dc needs.  Expected Discharge Date:  03/08/17               Expected Discharge Plan:  Home/Self Care  In-House Referral:  NA  Discharge planning Services  CM Consult, Medication Assistance  Post Acute Care Choice:  NA Choice offered to:  NA  DME Arranged:    DME Agency:     HH Arranged:    HH Agency:     Status of Service:  Completed, signed off  If discussed at [employer] of Stay Meetings, dates discussed:    Discharge Disposition: home/self care   Additional Comments:  03/08/17- 1000- [name] RN, CM- pt for d/c home today- per MD note will not start pt on any noacs at this time- no further CM needs noted for discharge- pt to d/c home with wife.   [name], RN 03/08/2017, 10:11 AM [phone]

## 2017-03-09 ENCOUNTER — Ambulatory Visit: Payer: Self-pay | Admitting: Cardiology

## 2017-03-09 ENCOUNTER — Telehealth (HOSPITAL_COMMUNITY): Payer: Self-pay

## 2017-03-09 NOTE — Telephone Encounter (Signed)
Patient insurance is active and benefits verified. Patient insurance is [employer] - no co-payment, deductible $200/$200 has been met, out of pocket $500/$264.73, 20% co-insurance and no pre-authorization. Passport/reference [phone].  Patient will be contacted and scheduled after their follow up appointment with the cardiologist office on 03/23/17 and surgeon on 04/04/17, upon review by [facility] RN navigator.

## 2017-03-15 ENCOUNTER — Telehealth: Payer: Self-pay | Admitting: *Deleted

## 2017-03-15 ENCOUNTER — Telehealth: Payer: Self-pay | Admitting: Cardiovascular Disease

## 2017-03-15 NOTE — Telephone Encounter (Signed)
Per pt notes clicking in chest with certain movements and talking no pain has some concerns .Pt instructed to call Dr Dorris FetchHendrickson for recommendations.Pt agrees./cy

## 2017-03-15 NOTE — Telephone Encounter (Signed)
New message     Patient states he is experiencing a "clicking" sound when he moves his arm. Had double bypass on 10/4. No pain. Patient concerned about the clicking. Please call.

## 2017-03-15 NOTE — Telephone Encounter (Signed)
Derrick [name] has called with concerns of some clicking in his chest. He is s/p CABG 10/4, discharged 03/08/17. He is not experencing any pain. I said some clicking is not unusual after surgery. Remember to not overextend your arms above your heads and no lifting greater than 10 lbs. for the first three months. He will notify us if it worsens.

## 2017-03-23 ENCOUNTER — Encounter: Payer: Self-pay | Admitting: Physician Assistant

## 2017-03-23 ENCOUNTER — Ambulatory Visit (INDEPENDENT_AMBULATORY_CARE_PROVIDER_SITE_OTHER): Payer: BLUE CROSS/BLUE SHIELD | Admitting: Physician Assistant

## 2017-03-23 VITALS — BP 127/70 | HR 58 | Resp 16 | Ht 74.0 in | Wt 209.8 lb

## 2017-03-23 DIAGNOSIS — E785 Hyperlipidemia, unspecified: Secondary | ICD-10-CM | POA: Diagnosis not present

## 2017-03-23 DIAGNOSIS — I25700 Atherosclerosis of coronary artery bypass graft(s), unspecified, with unstable angina pectoris: Secondary | ICD-10-CM

## 2017-03-23 DIAGNOSIS — I10 Essential (primary) hypertension: Secondary | ICD-10-CM

## 2017-03-23 DIAGNOSIS — I480 Paroxysmal atrial fibrillation: Secondary | ICD-10-CM

## 2017-03-23 NOTE — Patient Instructions (Signed)
Medication Instructions:  Your physician recommends that you continue on your current medications as directed. Please refer to the Current Medication list given to you today.  Labwork: None   Testing/Procedures: None   Follow-Up: KEEP FOLLOW UP APPOINTMENT AS SCHEDULED WITH DR Melburn PopperNASHER IN December 2018.   Any Other Special Instructions Will Be Listed Below (If Applicable).  If you need a refill on your cardiac medications before your next appointment, please call your pharmacy.

## 2017-03-23 NOTE — Progress Notes (Signed)
Cardiology Office Note    Date:  03/23/2017   ID:  Derrick [name], DOB 10/28/1955, MRN [id number]  PCP:  Iva Boop, MD  Cardiologist:  Dr. Elease Hashimoto  Chief Complaint: Hospital follow up s/p  CABG  History of Present Illness:   Derrick [name] is a 61 y.o. male with hx of CAD, HTN, PAD (Bilateral Fem-pop), hyperparathyroidism, multiple previous neck surgeries and HLD presents for follow up.   Hx of CAD s/p stents to the LAD,  left circumflex artery 2006 . His chronically occluded right coronary artery. Seen in clinic by Dr. Elease Hashimoto 02/27/17 for symptoms concerning for [place]. Cath showed high grade stenosis of the ostial left main artery and filling of the RCA from left to right collaterals. Patient mLAD stent and patent stent mid Circumflex with moderate disease in the proximal Circumflex prior to the stent. Recommended surgical intervention. S/p coronary artery bypass grafting x2 (left internal mammary artery to LAD, Y-graft right mammary from left mammary to OM). He had postoperative atrial fibrillation which is converted back to normal sinus rhythm. Plan to continue amiodarone for 3 months. Did not recommended anticoagulation. Advised to check heart rate regularly.   Here today for follow up. He is currently walking quarter of a mile without angina or dyspnea. He did not require any pain medication. Denies orthopnea, PND, syncope, lower extremity edema, melena or blood in his stool or urine. Initially after discharge his blood pressure was running in 150 to 160s now normalized to 130s. Incision healing well.   Past Medical History:  Diagnosis Date  . Coronary artery disease 2006   stents x2=LAD LCX; totally occluded RCA b. CABG 03/02/17 x2 (left internal mammary artery to LAD, Y-graft right mammary from left mammary to OM).  . Dyslipidemia   . GERD (gastroesophageal reflux disease)   . History of bronchitis   . Hyperlipidemia    statin intolerant  . Hyperparathyroidism   . Hypertension    . Insomnia   . Metatarsalgia   . OA (osteoarthritis)   . Peripheral vascular disease (HCC)   . Seasonal allergies   . Urinary frequency   . Wears glasses     Past Surgical History:  Procedure Laterality Date  . CARDIAC CATHETERIZATION  10/28/2004   circ and mid LAD chyper stents  . CERVICAL FUSION  [id number]  . CERVICAL SPINE SURGERY  2004  . COLONOSCOPY    . CORONARY ANGIOPLASTY  2006   stents placed   . CORONARY ARTERY BYPASS GRAFT N/A 03/02/2017   Procedure: CORONARY ARTERY BYPASS GRAFTING (CABG), ON PUMP, TIMES TWO, USING BILATERAL MAMMARY ARTERIES;  Surgeon: Loreli Slot, MD;  Location: [facility] OR;  Service: Open Heart Surgery;  Laterality: N/A;  . ELBOW ARTHROPLASTY  1994   right  . ENDARTERECTOMY FEMORAL Left 03/17/2015   Procedure: ENDARTERECTOMY COMMON FEMORAL WITH PROFUNDOPLASTY;  Surgeon: Sherren Kerns, MD;  Location: [facility] OR;  Service: Vascular;  Laterality: Left;  . FEMORAL-POPLITEAL BYPASS GRAFT Left 03/17/2015   Procedure: BYPASS GRAFT FEMORAL-POPLITEAL ARTERY-LEFT LEG AND POSTERIOR TIBIAL SEQUENTIAL GRAFT TO BELOW KNEE POPLITEAL ARTERY;  Surgeon: Sherren Kerns, MD;  Location: [facility] OR;  Service: Vascular;  Laterality: Left;  . FEMORAL-POPLITEAL BYPASS GRAFT Right 05/11/2015   Procedure:  RIGHT FEMORAL-BELOW THE KNEE POPLITEAL ARTERY BYPASS GRAFT USING NON REVERSE RIGHT GREATER SAPHENOUS VEIN;  Surgeon: Sherren Kerns, MD;  Location: [facility] OR;  Service: Vascular;  Laterality: Right;  . INCISION AND DRAINAGE ABSCESS POSTERIOR CERVICALSPINE  2009  .  INTRAOPERATIVE ARTERIOGRAM Left 03/17/2015   Procedure: INTRA OPERATIVE ARTERIOGRAM X2;  Surgeon: Sherren Kerns, MD;  Location: [facility] OR;  Service: Vascular;  Laterality: Left;  . INTRAOPERATIVE ARTERIOGRAM Right 05/11/2015   Procedure: INTRA OPERATIVE ARTERIOGRAM;  Surgeon: Sherren Kerns, MD;  Location: [facility] OR;  Service: Vascular;  Laterality: Right;  . LEFT HEART CATH AND CORONARY ANGIOGRAPHY N/A 03/01/2017    Procedure: LEFT HEART CATH AND CORONARY ANGIOGRAPHY;  Surgeon: Kathleene Hazel, MD;  Location: [facility] INVASIVE CV LAB;  Service: Cardiovascular;  Laterality: N/A;  . PATCH ANGIOPLASTY Left 03/17/2015   Procedure: VEIN PATCH ANGIOPLASTY COMMON FEMORAL ARTERY;  Surgeon: Sherren Kerns, MD;  Location: [facility] OR;  Service: Vascular;  Laterality: Left;  . PERIPHERAL VASCULAR CATHETERIZATION N/A 01/16/2015   Procedure: Abdominal Aortogram;  Surgeon: Sherren Kerns, MD;  Location: [facility] INVASIVE CV LAB;  Service: Cardiovascular;  Laterality: N/A;  . SHOULDER OPEN ROTATOR CUFF REPAIR Right 04/23/2013   Procedure: RIGHT TWO TENDON ROTATOR CUFF REPAIR  SUBACROMIAL DECOMPRESSION AND OPEN DISTAL CLAVICLE RESECTION;  Surgeon: Wyn Forster., MD;  Location: [facility];  Service: Orthopedics;  Laterality: Right;  . stents placed   2006  . TEE WITHOUT CARDIOVERSION N/A 03/02/2017   Procedure: TRANSESOPHAGEAL ECHOCARDIOGRAM (TEE);  Surgeon: Loreli Slot, MD;  Location: [facility] OR;  Service: Open Heart Surgery;  Laterality: N/A;  . VEIN HARVEST Right 05/11/2015   Procedure: WITH NON REVERSE RIGHT GREATER SAPHENOUS VEIN HARVEST;  Surgeon: Sherren Kerns, MD;  Location: [facility] OR;  Service: Vascular;  Laterality: Right;    Current Medications: Prior to Admission medications   Medication Sig Start Date End Date Taking? Authorizing Provider  acetaminophen (TYLENOL ARTHRITIS PAIN) 650 MG CR tablet Take 1,300 mg by mouth 2 (two) times daily.     [provider]  amiodarone (PACERONE) 400 MG tablet Take 1 tablet (400 mg total) by mouth 2 (two) times daily. For 7 days, then decrease to 400 mg daily 03/08/17   Barrett, Rae Roam, PA-C  aspirin EC 325 MG EC tablet Take 1 tablet (325 mg total) by mouth daily. 03/08/17   Barrett, Erin R, PA-C  calcitRIOL (ROCALTROL) 0.5 MCG capsule Take 0.5 mcg by mouth daily.    [provider]  calcium carbonate (OSCAL) 1500 (600 Ca) MG TABS tablet Take  600 mg of elemental calcium by mouth 2 (two) times daily with a meal.    [provider]  Cyanocobalamin (VITAMIN B-12) 2500 MCG SUBL Place 2,500 mcg under the tongue daily.    [provider]  diltiazem (CARDIZEM CD) 120 MG 24 hr capsule Take 1 capsule (120 mg total) by mouth daily. 03/08/17   Barrett, Erin R, PA-C  ezetimibe (ZETIA) 10 MG tablet Take 10 mg by mouth daily.      [provider]  fenofibrate 160 MG tablet Take 160 mg by mouth daily.    [provider]  fluticasone (FLONASE) 50 MCG/ACT nasal spray Place 2 sprays into both nostrils daily as needed for allergies or rhinitis.    [provider]  gabapentin (NEURONTIN) 300 MG capsule Take 300 mg by mouth every evening.  01/21/17   [provider]  losartan (COZAAR) 100 MG tablet Take 100 mg by mouth every evening.  11/22/14   [provider]  metoprolol tartrate (LOPRESSOR) 25 MG tablet Take 1 tablet (25 mg total) by mouth 2 (two) times daily. 03/08/17   Barrett, Erin R, PA-C  nitroGLYCERIN (NITROSTAT) 0.4  MG SL tablet Place 1 tablet (0.4 mg total) under the tongue every 5 (five) minutes as needed for chest pain. 02/27/17 05/17/20  Nahser, Deloris PingPhilip J, MD  Omega-3 Fatty Acids (OMEGA-3 EPA FISH OIL PO) Take 3,210 mg by mouth 2 (two) times daily.    [provider]  ranitidine (ZANTAC) 150 MG tablet Take 150 mg by mouth 2 (two) times daily.    [provider]  traMADol (ULTRAM) 50 MG tablet Take 1 tablet (50 mg total) by mouth every 4 (four) hours as needed for moderate pain. 03/08/17   Barrett, Erin R, PA-C  traZODone (DESYREL) 50 MG tablet Take 50 mg by mouth at bedtime.    [provider]  TURMERIC PO Take by mouth.    [provider]    Allergies:   Atorvastatin; Codeine; Crestor [rosuvastatin calcium]; Meloxicam; and Statins   Social History   Social History  . Marital status: Married    Spouse name: N/A  . Number of children: N/A  .  Years of education: N/A   Social History Main Topics  . Smoking status: Former Smoker    Types: Cigarettes    Quit date: 05/30/1989  . Smokeless tobacco: Never Used  . Alcohol use 0.0 oz/week     Comment: one drink a month  . Drug use: No  . Sexual activity: Not Asked   Other Topics Concern  . None   Social History Narrative  . None     Family History:  The patient's family history includes Arthritis in his mother; COPD in his brother and father; Cancer in his brother and brother; Lung cancer in his father; Varicose Veins in his mother.   ROS:   Please see the history of present illness.    ROS All other systems reviewed and are negative.   PHYSICAL EXAM:   VS:  BP 127/70   Pulse (!) 58   Resp 16   Ht 6\' 2"  (1.88 m)   Wt 209 lb 12.8 oz (95.2 kg)   SpO2 97%   BMI 26.94 kg/m    GEN: Well nourished, well developed, in no acute distress  HEENT: normal  Neck: no JVD, carotid bruits, or masses Cardiac: RRR; no murmurs, rubs, or gallops,no edema. Surgical incision site healing well. Respiratory:  clear to auscultation bilaterally, normal work of breathing GI: soft, nontender, nondistended, + BS MS: no deformity or atrophy  Skin: warm and dry, no rash Neuro:  Alert and Oriented x 3, Strength and sensation are intact Psych: euthymic mood, full affect  Wt Readings from Last 3 Encounters:  03/23/17 209 lb 12.8 oz (95.2 kg)  03/08/17 201 lb 8 oz (91.4 kg)  02/27/17 217 lb 12.8 oz (98.8 kg)      Studies/Labs Reviewed:   EKG:  EKG is not  ordered today.   Recent Labs: 03/01/2017: ALT 14 03/03/2017: Magnesium 2.0 03/06/2017: Hemoglobin 8.8; Platelets 246 03/07/2017: BUN 28; Creatinine, Ser 1.27; Potassium 4.0; Sodium 137   Lipid Panel No results found for: CHOL, TRIG, HDL, CHOLHDL, VLDL, LDLCALC, LDLDIRECT  Additional studies/ records that were reviewed today include:   Echocardiogram:  LEFT HEART CATH AND CORONARY ANGIOGRAPHY  03/01/17  Conclusion     Ost RCA to  Prox RCA lesion, 100 %stenosed.  Ost LM lesion, 70 %stenosed.  Mid Cx lesion, 10 %stenosed.  Prox Cx lesion, 50 %stenosed.  Mid LAD lesion, 10 %stenosed.  The left ventricular systolic function is normal.  LV end diastolic pressure is normal.  The left ventricular ejection fraction is greater than 65% by visual estimate.  There is no mitral valve regurgitation.   1. Severe triple vessel CAD 2. Severe ostial left main stenosis with dampening of pressures with catheter engagement.  3. Patent stent mid LAD 4. Patent stent mid Circumflex with moderate disease in the proximal Circumflex prior to the stent.  5. Chronic occlusion RCA with filling of distal branches by left to right collaterals.  6. Normal LV systolic function  Recommendations: He has high grade stenosis of the ostial left main artery and filling of the RCA from left to right collaterals. CABG is the best method for revascularization given his anatomy. Will consult CT surgery.     ASSESSMENT & PLAN:    1. CAD s/p stenting to LAD and Cx in 2006 and CABG x 2 (03/02/17) - No angina or dyspnea. Continue ASA 325mg  and BB.   2. PAF - Post operatively.  Plan to continue amiodarone for 3 months, Currently on Amiodarone 400mg  qd. Did not recommended anticoagulation. - No palpitations. Continue BB and CCB.   3. HTN - Stable and controlled on current medication.   4. HLD - No results found for requested labs within last 8760 hours.  -Continue zetia and fenofibrate. Intolerant to statins.    Medication Adjustments/Labs and Tests Ordered: Current medicines are reviewed at length with the patient today.  Concerns regarding medicines are outlined above.  Medication changes, Labs and Tests ordered today are listed in the Patient Instructions below. Patient Instructions  Medication Instructions:  Your physician recommends that you continue on your current medications as directed. Please refer to the Current Medication list  given to you today.  Labwork: None   Testing/Procedures: None   Follow-Up: KEEP FOLLOW UP APPOINTMENT AS SCHEDULED WITH DR Melburn Popper IN December 2018.   Any Other Special Instructions Will Be Listed Below (If Applicable).  If you need a refill on your cardiac medications before your next appointment, please call your pharmacy.     Lorelei Pont, [place]  03/23/2017 1:44 PM    [facility] [address] [place], [place], [place]  [address] Phone: [phone]; Fax: [phone]

## 2017-03-24 ENCOUNTER — Telehealth (HOSPITAL_COMMUNITY): Payer: Self-pay

## 2017-03-24 NOTE — Telephone Encounter (Signed)
Called patient in regards to Cardiac Rehab- Patient is interested in program. Scheduled orientation for 04/06/2017 at 8:00am. He will be attending the 11:15am exc class.

## 2017-03-31 ENCOUNTER — Other Ambulatory Visit: Payer: Self-pay | Admitting: Thoracic Surgery (Cardiothoracic Vascular Surgery)

## 2017-03-31 DIAGNOSIS — Z951 Presence of aortocoronary bypass graft: Secondary | ICD-10-CM

## 2017-04-03 ENCOUNTER — Ambulatory Visit
Admission: RE | Admit: 2017-04-03 | Discharge: 2017-04-03 | Disposition: A | Discharge status: Home / Self Care | Payer: BLUE CROSS/BLUE SHIELD | Source: Ambulatory Visit | Attending: Thoracic Surgery (Cardiothoracic Vascular Surgery) | Admitting: Thoracic Surgery (Cardiothoracic Vascular Surgery)

## 2017-04-03 ENCOUNTER — Ambulatory Visit (INDEPENDENT_AMBULATORY_CARE_PROVIDER_SITE_OTHER): Payer: Self-pay | Admitting: Physician Assistant

## 2017-04-03 VITALS — BP 130/74 | HR 59 | Ht 74.0 in | Wt 203.0 lb

## 2017-04-03 DIAGNOSIS — Z951 Presence of aortocoronary bypass graft: Secondary | ICD-10-CM

## 2017-04-03 NOTE — Progress Notes (Signed)
HPI:   Patient returns for routine postoperative follow-up having undergone CABG x 2 on 03/02/2017.  The patient's early postoperative recovery while in the hospital was notable for Atrial fibrillation, requiring Amiodarone and Cardizem drips for this.  Since hospital discharge the patient reports he is doing very well.  He is ambulating almost a mile per day.  He denies chest pain and shortness of breath.  His appetite has improved and he states he is trying to be more conscious about his food choices.  Current Outpatient Medications  Medication Sig Dispense Refill  . acetaminophen (TYLENOL ARTHRITIS PAIN) 650 MG CR tablet Take 1,300 mg by mouth 2 (two) times daily.     Marland Kitchen. amiodarone (PACERONE) 400 MG tablet Take 1 tablet (400 mg total) by mouth 2 (two) times daily. For 7 days, then decrease to 400 mg daily 60 tablet 1  . aspirin EC 325 MG EC tablet Take 1 tablet (325 mg total) by mouth daily. 30 tablet 0  . calcitRIOL (ROCALTROL) 0.5 MCG capsule Take 0.5 mcg by mouth daily.    . calcium carbonate (OSCAL) 1500 (600 Ca) MG TABS tablet Take 600 mg of elemental calcium daily by mouth.     . Cyanocobalamin (VITAMIN B-12) 2500 MCG SUBL Place 2,500 mcg under the tongue daily.    Marland Kitchen. diltiazem (CARDIZEM CD) 120 MG 24 hr capsule Take 1 capsule (120 mg total) by mouth daily. 30 capsule 3  . ezetimibe (ZETIA) 10 MG tablet Take 10 mg by mouth daily.      . fenofibrate 160 MG tablet Take 160 mg by mouth daily.    . fluticasone (FLONASE) 50 MCG/ACT nasal spray Place 2 sprays into both nostrils daily as needed for allergies or rhinitis.    Marland Kitchen. gabapentin (NEURONTIN) 300 MG capsule Take 300 mg by mouth every evening.   6  . losartan (COZAAR) 100 MG tablet Take 100 mg by mouth every evening.     . metoprolol tartrate (LOPRESSOR) 25 MG tablet Take 1 tablet (25 mg total) by mouth 2 (two) times daily. 60 tablet 3  . nitroGLYCERIN (NITROSTAT) 0.4 MG SL tablet Place 1 tablet (0.4 mg total) under the tongue every 5 (five)  minutes as needed for chest pain. 25 tablet 12  . Omega-3 Fatty Acids (OMEGA-3 EPA FISH OIL PO) Take 3,210 mg by mouth 2 (two) times daily.    . ranitidine (ZANTAC) 150 MG tablet Take 150 mg by mouth 2 (two) times daily.    . traZODone (DESYREL) 50 MG tablet Take 50 mg by mouth at bedtime.    . TURMERIC PO Take by mouth.    . traMADol (ULTRAM) 50 MG tablet Take 1 tablet (50 mg total) by mouth every 4 (four) hours as needed for moderate pain. 30 tablet 0   No current facility-administered medications for this visit.     Physical Exam:  BP 130/74   Pulse (!) 59   Ht 6\' 2"  (1.88 m)   Wt 203 lb (92.1 kg)   SpO2 96%   BMI 26.06 kg/m   Gen: no apparent distress Heart: RRR Lungs; CTA bilaterally Abd: soft non-tender, non-distended Incisions: well healed  Diagnostic Tests:  CXR: no pleural effusions, sternal wires are intact Impression:  A/P:  1. CV- PAF, in hospital, maintaining NSR- remains on Amiodarone per Cardiology will have 3 months of treatment, Lopressor, Cardizem, and Cozaar 2. Activity- as tolerated, patient instructed to continue ambulating and to increase activity as he can.  He was again  given instructions in regards to weight lifting restrictions and use of arms.  He was instructed he may resume driving 3. RTC prn  Lowella Dandy, PA-C [facility] [phone]

## 2017-04-03 NOTE — Patient Instructions (Signed)
You may return to driving an automobile as long as you are no longer requiring oral narcotic pain relievers during the daytime.  It would be wise to start driving only short distances during the daylight and gradually increase from there as you feel comfortable.  You may continue to gradually increase your physical activity as tolerated.  Refrain from any heavy lifting or strenuous use of your arms and shoulders until at least 8 weeks from the time of your surgery, and avoid activities that cause increased pain in your chest on the side of your surgical incision.  Otherwise you may continue to increase activities without any particular limitations.  Increase the intensity and duration of physical activity gradually.  

## 2017-04-04 ENCOUNTER — Ambulatory Visit: Payer: Self-pay | Admitting: Thoracic Surgery (Cardiothoracic Vascular Surgery)

## 2017-04-05 ENCOUNTER — Telehealth (HOSPITAL_COMMUNITY): Payer: Self-pay

## 2017-04-05 NOTE — Telephone Encounter (Signed)
Cardiac Rehab Medication Review by a Pharmacist  Does the patient  feel that his/her medications are working for him/her?  yes  Has the patient been experiencing any side effects to the medications prescribed?  no  Does the patient measure his/her own blood pressure or blood glucose at home?  no   Does the patient have any problems obtaining medications due to transportation or finances?   yes  Understanding of regimen: good Understanding of indications: good Potential of compliance: good   Pharmacist comments: Patient endorses compliance to all medications and denies any adverse effects. He was monitoring blood pressure at home, but was told at a recent physician visit that he does not need to do so anymore. He states his blood pressure generally runs in the 140s/60s mmHg.    Adline PotterSabrina Trisa Cranor, PharmD Pharmacy Resident Pager: [phone]

## 2017-04-05 NOTE — Telephone Encounter (Signed)
UPDATE: Patients insurance is active and benefits verified through [employer] - no co-payment, deductible amount of $200/$200 has been met, out of pocket amount of $500/$500 has been met, 20% co-insurance, and no pre-authorization is required. Passport/reference [phone]

## 2017-04-06 ENCOUNTER — Encounter (HOSPITAL_COMMUNITY): Payer: Self-pay

## 2017-04-06 ENCOUNTER — Encounter (HOSPITAL_COMMUNITY)
Admission: RE | Admit: 2017-04-06 | Discharge: 2017-04-06 | Disposition: A | Discharge status: Home / Self Care | Payer: BLUE CROSS/BLUE SHIELD | Source: Ambulatory Visit | Attending: Cardiovascular Disease | Admitting: Cardiovascular Disease

## 2017-04-06 VITALS — BP 118/60 | HR 56 | Ht 73.0 in | Wt 209.9 lb

## 2017-04-06 DIAGNOSIS — Z951 Presence of aortocoronary bypass graft: Secondary | ICD-10-CM | POA: Insufficient documentation

## 2017-04-06 DIAGNOSIS — Z48812 Encounter for surgical aftercare following surgery on the circulatory system: Secondary | ICD-10-CM | POA: Insufficient documentation

## 2017-04-06 NOTE — Progress Notes (Signed)
Derrick AllegraSteven R [name] 61 y.o. male DOB: 12/04/1955 MRN: [id number]      Nutrition Note  1. S/P CABG x 2    Past Medical History:  Diagnosis Date  . Coronary artery disease 2006   stents x2=LAD LCX; totally occluded RCA b. CABG 03/02/17 x2 (left internal mammary artery to LAD, Y-graft right mammary from left mammary to OM).  . Dyslipidemia   . GERD (gastroesophageal reflux disease)   . History of bronchitis   . Hyperlipidemia    statin intolerant  . Hyperparathyroidism   . Hypertension   . Insomnia   . Metatarsalgia   . OA (osteoarthritis)   . Peripheral vascular disease (HCC)   . Seasonal allergies   . Urinary frequency   . Wears glasses    Meds reviewed.   HT: Ht Readings from Last 1 Encounters:  04/06/17 6\' 1"  (1.854 m)    WT: Wt Readings from Last 3 Encounters:  04/06/17 209 lb 14.1 oz (95.2 kg)  04/03/17 203 lb (92.1 kg)  03/23/17 209 lb 12.8 oz (95.2 kg)     BMI 27.7   Current tobacco use? No  Labs:  Lipid Panel  No results found for: CHOL, TRIG, HDL, CHOLHDL, VLDL, LDLCALC, LDLDIRECT  Lab Results  Component Value Date   HGBA1C 5.4 03/01/2017   CBG (last 3)  No results for input(s): GLUCAP in the last 72 hours.  Nutrition Note Spoke with pt. Nutrition plan and goals reviewed with pt. Pt states he went from 219 lb to 201 lb post-operatively. Pt wt now ~ 209 lb. Pt wants to maintain his wt around 210 lb while in rehab. Pt expressed understanding of the information reviewed. Pt aware of nutrition education classes offered.  Nutrition Diagnosis Food-and nutrition-related knowledge deficit related to lack of exposure to information as related to diagnosis of: ? CVD   Nutrition Intervention ? Pt's individual nutrition plan and goals reviewed with pt. ? Pt given handouts for: ? Nutrition I class ? Nutrition II class   Nutrition Goal(s):  ? Pt to identify and limit food sources of saturated fat, trans fat, and sodium Pt to identify food quantities necessary to  achieve weight maintenance around 210 lb at graduation from cardiac rehab.  Plan:  Pt to attend nutrition classes ? Portion Distortion  Will provide client-centered nutrition education as part of interdisciplinary care.   Monitor and evaluate progress toward nutrition goal with team.  Derrick PlumbEdna Eryk [name], M.Ed, RD, LDN, CDE 04/06/2017 12:36 PM

## 2017-04-06 NOTE — Progress Notes (Signed)
Cardiac Individual Treatment Plan  Patient Details  Name: ZAKRY CASO MRN: [id number] Date of Birth: 1955/08/06 Referring Provider:     CARDIAC REHAB PHASE II ORIENTATION from 04/06/2017 in [facility] CARDIAC [facility]  Referring Provider  Kristeen Miss MD      Initial Encounter Date:    CARDIAC REHAB PHASE II ORIENTATION from 04/06/2017 in [facility] CARDIAC REHAB  Date  04/06/17  Referring Provider  Nahser, Loistine Chance MD      Visit Diagnosis: S/P CABG x 2  Patient's Home Medications on Admission:  Current Outpatient Medications:  .  acetaminophen (TYLENOL ARTHRITIS PAIN) 650 MG CR tablet, Take 1,300 mg by mouth 2 (two) times daily. , Disp: , Rfl:  .  amiodarone (PACERONE) 400 MG tablet, Take 1 tablet (400 mg total) by mouth 2 (two) times daily. For 7 days, then decrease to 400 mg daily, Disp: 60 tablet, Rfl: 1 .  aspirin EC 325 MG EC tablet, Take 1 tablet (325 mg total) by mouth daily., Disp: 30 tablet, Rfl: 0 .  calcitRIOL (ROCALTROL) 0.5 MCG capsule, Take 0.5 mcg by mouth daily., Disp: , Rfl:  .  calcium carbonate (OSCAL) 1500 (600 Ca) MG TABS tablet, Take 600 mg of elemental calcium 2 (two) times daily with a meal by mouth. Patient takes 2 in the morning and 1 in the evening., Disp: , Rfl:  .  Cyanocobalamin (VITAMIN B-12) 2500 MCG SUBL, Place 2,500 mcg under the tongue daily., Disp: , Rfl:  .  diltiazem (CARDIZEM CD) 120 MG 24 hr capsule, Take 1 capsule (120 mg total) by mouth daily., Disp: 30 capsule, Rfl: 3 .  ezetimibe (ZETIA) 10 MG tablet, Take 10 mg by mouth daily.  , Disp: , Rfl:  .  fenofibrate 160 MG tablet, Take 160 mg by mouth daily., Disp: , Rfl:  .  fluticasone (FLONASE) 50 MCG/ACT nasal spray, Place 2 sprays into both nostrils daily as needed for allergies or rhinitis., Disp: , Rfl:  .  gabapentin (NEURONTIN) 300 MG capsule, Take 300 mg by mouth every evening. , Disp: , Rfl: 6 .  losartan (COZAAR) 100 MG tablet, Take 100 mg by mouth  every evening. , Disp: , Rfl:  .  metoprolol tartrate (LOPRESSOR) 25 MG tablet, Take 1 tablet (25 mg total) by mouth 2 (two) times daily., Disp: 60 tablet, Rfl: 3 .  nitroGLYCERIN (NITROSTAT) 0.4 MG SL tablet, Place 1 tablet (0.4 mg total) under the tongue every 5 (five) minutes as needed for chest pain., Disp: 25 tablet, Rfl: 12 .  Omega-3 Fatty Acids (OMEGA-3 EPA FISH OIL PO), Take 3,210 mg by mouth 2 (two) times daily., Disp: , Rfl:  .  ranitidine (ZANTAC) 150 MG tablet, Take 150 mg by mouth 2 (two) times daily., Disp: , Rfl:  .  traMADol (ULTRAM) 50 MG tablet, Take 1 tablet (50 mg total) by mouth every 4 (four) hours as needed for moderate pain. (Patient not taking: Reported on 04/05/2017), Disp: 30 tablet, Rfl: 0 .  traZODone (DESYREL) 50 MG tablet, Take 50 mg by mouth at bedtime., Disp: , Rfl:  .  TURMERIC PO, Take by mouth., Disp: , Rfl:   Past Medical History: Past Medical History:  Diagnosis Date  . Coronary artery disease 2006   stents x2=LAD LCX; totally occluded RCA b. CABG 03/02/17 x2 (left internal mammary artery to LAD, Y-graft right mammary from left mammary to OM).  . Dyslipidemia   . GERD (gastroesophageal reflux disease)   .  History of bronchitis   . Hyperlipidemia    statin intolerant  . Hyperparathyroidism   . Hypertension   . Insomnia   . Metatarsalgia   . OA (osteoarthritis)   . Peripheral vascular disease (HCC)   . Seasonal allergies   . Urinary frequency   . Wears glasses     Tobacco Use: Social History   Tobacco Use  Smoking Status Former Smoker  . Types: Cigarettes  . Last attempt to quit: 05/30/1989  . Years since quitting: 27.8  Smokeless Tobacco Never Used    Labs: Recent Hydrographic [facility] Flowsheet Data    Labs for ITP Cardiac and Pulmonary Rehab Latest Ref Rng & Units 03/02/2017 03/02/2017 03/03/2017 03/03/2017 03/03/2017   Hemoglobin A1c 4.8 - 5.6 % - - - - -   PHART 7.350 - 7.450 - 7.285(L) 7.298(L) - -   PCO2ART 32.0 - 48.0 mmHg - 49.5(H) 46.7 - -   HCO3  20.0 - 28.0 mmol/L - 23.4 22.7 - -   TCO2 22 - 32 mmol/L 24 25 24 24 25    ACIDBASEDEF 0.0 - 2.0 mmol/L - 3.0(H) 4.0(H) - -   O2SAT % - 99.0 98.0 - -      Capillary Blood Glucose: Lab Results  Component Value Date   GLUCAP 121 (H) 03/07/2017   GLUCAP 125 (H) 03/06/2017   GLUCAP 107 (H) 03/06/2017   GLUCAP 114 (H) 03/06/2017   GLUCAP 133 (H) 03/05/2017     Exercise Target Goals: Date: 04/06/17  Exercise Program Goal: Individual exercise prescription set with THRR, safety & activity barriers. Participant demonstrates ability to understand and report RPE using BORG scale, to self-measure pulse accurately, and to acknowledge the importance of the exercise prescription.  Exercise Prescription Goal: Starting with aerobic activity 30 plus minutes a day, 3 days per week for initial exercise prescription. Provide home exercise prescription and guidelines that participant acknowledges understanding prior to discharge.  Activity Barriers & Risk Stratification: Activity Barriers & Cardiac Risk Stratification - 04/06/17 0852      Activity Barriers & Cardiac Risk Stratification   Activity Barriers  Joint Problems;Deconditioning;Muscular Weakness;Arthritis;Neck/Spine Problems;Other (comment)    Comments  R RTC, R elbow surgery and PVD    Cardiac Risk Stratification  High       6 Minute Walk: 6 Minute Walk    Row Name 04/06/17 1203         6 Minute Walk   Phase  Initial     Distance  1265 feet     Walk Time  6 minutes     # of Rest Breaks  0     MPH  2.4     METS  3.05     RPE  13     VO2 Peak  10.66     Symptoms  No     Resting HR  56 bpm     Resting BP  118/60     Resting Oxygen Saturation   99 %     Exercise Oxygen Saturation  during 6 min walk  100 %     Max Ex. HR  70 bpm     Max Ex. BP  138/58     2 Minute Post BP  110/58        Oxygen Initial Assessment:   Oxygen Re-Evaluation:   Oxygen Discharge (Final Oxygen Re-Evaluation):   Initial Exercise  Prescription: Initial Exercise Prescription - 04/06/17 1200      Date of Initial Exercise RX and Referring Provider  Date  04/06/17    Referring Provider  Nahser, Philip MD      Treadmill   MPH  2.4    Grade  0    Minutes  10    METs  2.84      Bike   Level  0.7    Minutes  10    METs  2.39      NuStep   Level  3    SPM  80    Minutes  10    METs  2      Prescription Details   Frequency (times per week)  3    Duration  Progress to 30 minutes of continuous aerobic without signs/symptoms of physical distress      Intensity   THRR 40-80% of Max Heartrate  63-125    Ratings of Perceived Exertion  11-13    Perceived Dyspnea  0-4      Progression   Progression  Continue to progress workloads to maintain intensity without signs/symptoms of physical distress.      Resistance Training   Training Prescription  Yes    Weight  2lbs    Reps  10-15       Perform Capillary Blood Glucose checks as needed.  Exercise Prescription Changes:   Exercise Comments:   Exercise Goals and Review: Exercise Goals    Row Name 04/06/17 0853             Exercise Goals   Increase Physical Activity  Yes       Intervention  Provide advice, education, support and counseling about physical activity/exercise needs.;Develop an individualized exercise prescription for aerobic and resistive training based on initial evaluation findings, risk stratification, comorbidities and participant's personal goals.       Expected Outcomes  Achievement of increased cardiorespiratory fitness and enhanced flexibility, muscular endurance and strength shown through measurements of functional capacity and personal statement of participant.       Increase Strength and Stamina  Yes increase strength in legs and muscles throughout       Intervention  Provide advice, education, support and counseling about physical activity/exercise needs.;Develop an individualized exercise prescription for aerobic and resistive  training based on initial evaluation findings, risk stratification, comorbidities and participant's personal goals.       Expected Outcomes  Achievement of increased cardiorespiratory fitness and enhanced flexibility, muscular endurance and strength shown through measurements of functional capacity and personal statement of participant.       Able to understand and use rate of perceived exertion (RPE) scale  Yes       Intervention  Provide education and explanation on how to use RPE scale       Expected Outcomes  Long Term:  Able to use RPE to guide intensity level when exercising independently;Short Term: Able to use RPE daily in rehab to express subjective intensity level       Knowledge and understanding of Target Heart Rate Range (THRR)  Yes       Intervention  Provide education and explanation of THRR including how the numbers were predicted and where they are located for reference       Expected Outcomes  Short Term: Able to state/look up THRR;Long Term: Able to use THRR to govern intensity when exercising independently;Short Term: Able to use daily as guideline for intensity in rehab       Able to check pulse independently  Yes       Intervention  Provide education  and demonstration on how to check pulse in carotid and radial arteries.;Review the importance of being able to check your own pulse for safety during independent exercise       Expected Outcomes  Short Term: Able to explain why pulse checking is important during independent exercise;Long Term: Able to check pulse independently and accurately       Understanding of Exercise Prescription  Yes       Intervention  Provide education, explanation, and written materials on patient's individual exercise prescription       Expected Outcomes  Short Term: Able to explain program exercise prescription;Long Term: Able to explain home exercise prescription to exercise independently          Exercise Goals Re-Evaluation :    Discharge  Exercise Prescription (Final Exercise Prescription Changes):   Nutrition:  Target Goals: Understanding of nutrition guidelines, daily intake of sodium 1500mg , cholesterol 200mg , calories 30% from fat and 7% or less from saturated fats, daily to have 5 or more servings of fruits and vegetables.  Biometrics: Pre Biometrics - 04/06/17 1207      Pre Biometrics   Waist Circumference  40 inches    Hip Circumference  40.25 inches    Waist to Hip Ratio  0.99 %    Triceps Skinfold  17 mm    % Body Fat  27.4 %    Grip Strength  37 kg    Flexibility  7.5 in    Single Leg Stand  15.23 seconds        Nutrition Therapy Plan and Nutrition Goals: Nutrition Therapy & Goals - 04/06/17 1241      Nutrition Therapy   Diet  Therapeutic Lifestyle Changes      Personal Nutrition Goals   Nutrition Goal  Maintain wt of ~ 210 lb while in rehab.     Personal Goal #2  Pt to identify and limit food sources of saturated fat, trans fat, and sodium.       Intervention Plan   Intervention  Prescribe, educate and counsel regarding individualized specific dietary modifications aiming towards targeted core components such as weight, hypertension, lipid management, diabetes, heart failure and other comorbidities.    Expected Outcomes  Short Term Goal: Understand basic principles of dietary content, such as calories, fat, sodium, cholesterol and nutrients.;Long Term Goal: Adherence to prescribed nutrition plan.       Nutrition Discharge: Nutrition Scores: Nutrition Assessments - 04/06/17 1242      MEDFICTS Scores   Pre Score  50       Nutrition Goals Re-Evaluation:   Nutrition Goals Re-Evaluation:   Nutrition Goals Discharge (Final Nutrition Goals Re-Evaluation):   Psychosocial: Target Goals: Acknowledge presence or absence of significant depression and/or stress, maximize coping skills, provide positive support system. Participant is able to verbalize types and ability to use techniques and  skills needed for reducing stress and depression.  Initial Review & Psychosocial Screening: Initial Psych Review & Screening - 04/06/17 1322      Initial Review   Current issues with  None Identified      Family Dynamics   Good Support System?  Yes      Barriers   Psychosocial barriers to participate in program  There are no identifiable barriers or psychosocial needs.      Screening Interventions   Interventions  Encouraged to exercise       Quality of Life Scores: Quality of Life - 04/06/17 1212      Quality of Life  Scores   Health/Function Pre  26.23 %    Socioeconomic Pre  26.86 %    Psych/Spiritual Pre  27.64 %    Family Pre  28.13 %    GLOBAL Pre  26.89 %       PHQ-9: Recent Review Flowsheet Data    There is no flowsheet data to display.     Interpretation of Total Score  Total Score Depression Severity:  1-4 = Minimal depression, 5-9 = Mild depression, 10-14 = Moderate depression, 15-19 = Moderately severe depression, 20-27 = Severe depression   Psychosocial Evaluation and Intervention:   Psychosocial Re-Evaluation:   Psychosocial Discharge (Final Psychosocial Re-Evaluation):   Vocational Rehabilitation: Provide vocational rehab assistance to qualifying candidates.   Vocational Rehab Evaluation & Intervention: Vocational Rehab - 04/06/17 1322      Initial Vocational Rehab Evaluation & Intervention   Assessment shows need for Vocational Rehabilitation  No       Education: Education Goals: Education classes will be provided on a weekly basis, covering required topics. Participant will state understanding/return demonstration of topics presented.  Learning Barriers/Preferences: Learning Barriers/Preferences - 04/06/17 0850      Learning Barriers/Preferences   Learning Barriers  Sight    Learning Preferences  Individual Instruction;Skilled Demonstration;Written Material       Education Topics: Count Your Pulse:  -Group instruction provided  by verbal instruction, demonstration, patient participation and written materials to support subject.  Instructors address importance of being able to find your pulse and how to count your pulse when at home without a heart monitor.  Patients get hands on experience counting their pulse with staff help and individually.   Heart Attack, Angina, and Risk Factor Modification:  -Group instruction provided by verbal instruction, video, and written materials to support subject.  Instructors address signs and symptoms of angina and heart attacks.    Also discuss risk factors for heart disease and how to make changes to improve heart health risk factors.   Functional Fitness:  -Group instruction provided by verbal instruction, demonstration, patient participation, and written materials to support subject.  Instructors address safety measures for doing things around the house.  Discuss how to get up and down off the floor, how to pick things up properly, how to safely get out of a chair without assistance, and balance training.   Meditation and Mindfulness:  -Group instruction provided by verbal instruction, patient participation, and written materials to support subject.  Instructor addresses importance of mindfulness and meditation practice to help reduce stress and improve awareness.  Instructor also leads participants through a meditation exercise.    Stretching for Flexibility and Mobility:  -Group instruction provided by verbal instruction, patient participation, and written materials to support subject.  Instructors lead participants through series of stretches that are designed to increase flexibility thus improving mobility.  These stretches are additional exercise for major muscle groups that are typically performed during regular warm up and cool down.   Hands Only CPR:  -Group verbal, video, and participation provides a basic overview of AHA guidelines for community CPR. Role-play of  emergencies allow participants the opportunity to practice calling for help and chest compression technique with discussion of AED use.   Hypertension: -Group verbal and written instruction that provides a basic overview of hypertension including the most recent diagnostic guidelines, risk factor reduction with self-care instructions and medication management.    Nutrition I class: Heart Healthy Eating:  -Group instruction provided by PowerPoint slides, verbal discussion, and written materials to  support subject matter. The instructor gives an explanation and review of the Therapeutic Lifestyle Changes diet recommendations, which includes a discussion on lipid goals, dietary fat, sodium, fiber, plant stanol/sterol esters, sugar, and the components of a well-balanced, healthy diet.   Nutrition II class: Lifestyle Skills:  -Group instruction provided by PowerPoint slides, verbal discussion, and written materials to support subject matter. The instructor gives an explanation and review of label reading, grocery shopping for heart health, heart healthy recipe modifications, and ways to make healthier choices when eating out.   Diabetes Question & Answer:  -Group instruction provided by PowerPoint slides, verbal discussion, and written materials to support subject matter. The instructor gives an explanation and review of diabetes co-morbidities, pre- and post-prandial blood glucose goals, pre-exercise blood glucose goals, signs, symptoms, and treatment of hypoglycemia and hyperglycemia, and foot care basics.   Diabetes Blitz:  -Group instruction provided by PowerPoint slides, verbal discussion, and written materials to support subject matter. The instructor gives an explanation and review of the physiology behind type 1 and type 2 diabetes, diabetes medications and rational behind using different medications, pre- and post-prandial blood glucose recommendations and Hemoglobin A1c goals, diabetes  diet, and exercise including blood glucose guidelines for exercising safely.    Portion Distortion:  -Group instruction provided by PowerPoint slides, verbal discussion, written materials, and food models to support subject matter. The instructor gives an explanation of serving size versus portion size, changes in portions sizes over the last 20 years, and what consists of a serving from each food group.   Stress Management:  -Group instruction provided by verbal instruction, video, and written materials to support subject matter.  Instructors review role of stress in heart disease and how to cope with stress positively.     Exercising on Your Own:  -Group instruction provided by verbal instruction, power point, and written materials to support subject.  Instructors discuss benefits of exercise, components of exercise, frequency and intensity of exercise, and end points for exercise.  Also discuss use of nitroglycerin and activating EMS.  Review options of places to exercise outside of rehab.  Review guidelines for sex with heart disease.   Cardiac Drugs I:  -Group instruction provided by verbal instruction and written materials to support subject.  Instructor reviews cardiac drug classes: antiplatelets, anticoagulants, beta blockers, and statins.  Instructor discusses reasons, side effects, and lifestyle considerations for each drug class.   Cardiac Drugs II:  -Group instruction provided by verbal instruction and written materials to support subject.  Instructor reviews cardiac drug classes: angiotensin converting enzyme inhibitors (ACE-I), angiotensin II receptor blockers (ARBs), nitrates, and calcium channel blockers.  Instructor discusses reasons, side effects, and lifestyle considerations for each drug class.   Anatomy and Physiology of the Circulatory System:  Group verbal and written instruction and models provide basic cardiac anatomy and physiology, with the coronary electrical and  arterial systems. Review of: AMI, Angina, Valve disease, Heart Failure, Peripheral Artery Disease, Cardiac Arrhythmia, Pacemakers, and the ICD.   Other Education:  -Group or individual verbal, written, or video instructions that support the educational goals of the cardiac rehab program.   Knowledge Questionnaire Score: Knowledge Questionnaire Score - 04/06/17 1159      Knowledge Questionnaire Score   Pre Score  15/24       Core Components/Risk Factors/Patient Goals at Admission: Personal Goals and Risk Factors at Admission - 04/06/17 1216      Core Components/Risk Factors/Patient Goals on Admission    Weight Management  Yes;Weight  Maintenance;Weight Loss    Intervention  Weight Management: Develop a combined nutrition and exercise program designed to reach desired caloric intake, while maintaining appropriate intake of nutrient and fiber, sodium and fats, and appropriate energy expenditure required for the weight goal.;Weight Management: Provide education and appropriate resources to help participant work on and attain dietary goals.;Weight Management/Obesity: Establish reasonable short term and long term weight goals.    Admit Weight  209 lb 14.1 oz (95.2 kg)    Goal Weight: Short Term  205 lb (93 kg)    Goal Weight: Long Term  200 lb (90.7 kg)    Expected Outcomes  Short Term: Continue to assess and modify interventions until short term weight is achieved;Long Term: Adherence to nutrition and physical activity/exercise program aimed toward attainment of established weight goal;Weight Maintenance: Understanding of the daily nutrition guidelines, which includes 25-35% calories from fat, 7% or less cal from saturated fats, less than 200mg  cholesterol, less than 1.5gm of sodium, & 5 or more servings of fruits and vegetables daily;Weight Loss: Understanding of general recommendations for a balanced deficit meal plan, which promotes 1-2 lb weight loss per week and includes a negative energy  balance of [phone] kcal/d;Understanding recommendations for meals to include 15-35% energy as protein, 25-35% energy from fat, 35-60% energy from carbohydrates, less than 200mg  of dietary cholesterol, 20-35 gm of total fiber daily;Understanding of distribution of calorie intake throughout the day with the consumption of 4-5 meals/snacks       Core Components/Risk Factors/Patient Goals Review:    Core Components/Risk Factors/Patient Goals at Discharge (Final Review):    ITP Comments: ITP Comments    Row Name 04/06/17 0822           ITP Comments  Dr. Armanda Magic, Medical Director          Comments:  Patient attended orientation from [phone] to 1000 to review rules and guidelines for program. Completed 6 minute walk test, Intitial ITP, and exercise prescription.  VSS. Telemetry-SB, SR with no noted ectopy.  Pt was asymptomatic with no complaints.  Brief Psychosocial Assessment reveals no immediate barriers to participating.   Pt is eager to get started and is looking forward to full exercise. Alanson Aly, BSN Cardiac and Emergency planning/management officer

## 2017-04-10 ENCOUNTER — Encounter (HOSPITAL_COMMUNITY)
Admission: RE | Admit: 2017-04-10 | Discharge: 2017-04-10 | Disposition: A | Discharge status: Home / Self Care | Payer: BLUE CROSS/BLUE SHIELD | Source: Ambulatory Visit | Attending: Cardiovascular Disease | Admitting: Cardiovascular Disease

## 2017-04-10 ENCOUNTER — Telehealth: Payer: Self-pay | Admitting: Cardiology

## 2017-04-10 ENCOUNTER — Encounter (HOSPITAL_COMMUNITY): Payer: BLUE CROSS/BLUE SHIELD

## 2017-04-10 DIAGNOSIS — Z951 Presence of aortocoronary bypass graft: Secondary | ICD-10-CM | POA: Diagnosis present

## 2017-04-10 DIAGNOSIS — Z48812 Encounter for surgical aftercare following surgery on the circulatory system: Secondary | ICD-10-CM | POA: Diagnosis not present

## 2017-04-10 NOTE — Progress Notes (Signed)
Daily Session Note  Patient Details  Name: Derrick [name] MRN: [id number] Date of Birth: 01-10-1956 Referring Provider:     [place] from 04/06/2017 in [place]  Referring Provider  Mertie Moores MD      Encounter Date: 04/10/2017  Check In: Session Check In - 04/10/17 1459      Check-In   Location  [facility]    Staff Present  Su Hilt, MS, ACSM RCEP, Exercise Physiologist;Olinty Dresden, MS, ACSM CEP, Exercise Physiologist;Maria Whitaker, RN, BSN    Supervising physician immediately available to respond to emergencies  [facility] immediately available    Physician(s)  Dr. Maylene Roes    Medication changes reported      No    Fall or balance concerns reported     No    Tobacco Cessation  No Change    Warm-up and Cool-down  Performed as group-led instruction    Resistance Training Performed  Yes    VAD Patient?  No      Pain Assessment   Currently in Pain?  No/denies    Multiple Pain Sites  No       Capillary Blood Glucose: No results found for this or any previous visit (from the past 24 hour(s)).    Social History   Tobacco Use  Smoking Status Former Smoker  . Types: Cigarettes  . Last attempt to quit: 05/30/1989  . Years since quitting: 27.8  Smokeless Tobacco Never Used    Goals Met:  No report of cardiac concerns or symptoms  Goals Unmet:  BP  Comments: [name] initial blood pressure was 160/58. After sitting and resting [name]'s recheck blood pressure was 142/80 sitting and 138/64 standing. [name] said he felt lightheaded at home and took medication for dizziness this morning. Telemetry rhythm Sinus rate 60. [name] said the last time felt dizzy like he did this morning was back in September. Cecilie Kicks NP was paged and notified. Mickel Baas said to let Mr Ermis proceed with exercise. If Mr Leichter has any more dizzy episodes he will need to schedule a visit for follow up. [name]  exercised without difficulty. Exit blood pressure 138/72. Heart rate 61.Will continue to monitor the patient throughout  the program.PHQ equal 0. [name] said his brother died a few year ago from COPD.Barnet Pall, RN,BSN 04/10/2017 5:05 PM       Dr. Fransico Him is Medical Director for Cardiac Rehab at [facility].

## 2017-04-10 NOTE — Telephone Encounter (Signed)
Pt became dizzy has hx of this and uses meclizine.  I have asked him to continue to use.  His BP was mildy elevated, he will proceed with rehab and if BP increases they will call us.  If dizziness continues he will call us for appt.

## 2017-04-12 ENCOUNTER — Encounter (HOSPITAL_COMMUNITY)
Admission: RE | Admit: 2017-04-12 | Discharge: 2017-04-12 | Disposition: A | Discharge status: Home / Self Care | Payer: BLUE CROSS/BLUE SHIELD | Source: Ambulatory Visit | Attending: Cardiovascular Disease | Admitting: Cardiovascular Disease

## 2017-04-12 ENCOUNTER — Encounter (HOSPITAL_COMMUNITY): Payer: BLUE CROSS/BLUE SHIELD

## 2017-04-12 DIAGNOSIS — Z48812 Encounter for surgical aftercare following surgery on the circulatory system: Secondary | ICD-10-CM | POA: Diagnosis not present

## 2017-04-12 DIAGNOSIS — Z951 Presence of aortocoronary bypass graft: Secondary | ICD-10-CM

## 2017-04-13 ENCOUNTER — Ambulatory Visit (HOSPITAL_COMMUNITY)
Admission: RE | Admit: 2017-04-13 | Discharge: 2017-04-13 | Disposition: A | Discharge status: Home / Self Care | Payer: BLUE CROSS/BLUE SHIELD | Source: Ambulatory Visit | Attending: Vascular Surgery | Admitting: Vascular Surgery

## 2017-04-13 ENCOUNTER — Ambulatory Visit: Payer: BLUE CROSS/BLUE SHIELD | Admitting: Family

## 2017-04-13 ENCOUNTER — Encounter: Payer: Self-pay | Admitting: Family

## 2017-04-13 VITALS — BP 125/66 | HR 54 | Temp 97.5°F | Resp 18 | Wt 213.8 lb

## 2017-04-13 DIAGNOSIS — Z95828 Presence of other vascular implants and grafts: Secondary | ICD-10-CM

## 2017-04-13 DIAGNOSIS — I739 Peripheral vascular disease, unspecified: Secondary | ICD-10-CM | POA: Diagnosis not present

## 2017-04-13 DIAGNOSIS — I779 Disorder of arteries and arterioles, unspecified: Secondary | ICD-10-CM

## 2017-04-13 DIAGNOSIS — R0989 Other specified symptoms and signs involving the circulatory and respiratory systems: Secondary | ICD-10-CM | POA: Insufficient documentation

## 2017-04-13 DIAGNOSIS — Z87891 Personal history of nicotine dependence: Secondary | ICD-10-CM

## 2017-04-13 NOTE — Progress Notes (Signed)
[facility] [place]   CC: Follow up peripheral artery occlusive disease  History of Present Illness MARISA HUFSTETLER is a 61 y.o. male who is s/p left femoral to posterior tibial bypass and a sequential graft to the below-knee popliteal artery on March 17, 2015. He subsequently underwent right femoral to below-knee popliteal bypass using saphenous vein on May 11, 2015 by Dr. Darrick Penna.  He is also s/p bilateral CIA stents placed on 01-16-15.   He presents today for follow-up. He denies any claudication symptoms with walking. He is participating in cardiac rehab now.   He had a CABG on 03-02-17, several coronary artery stents placed prior to this.   He denies any hx of stroke or TIA.   Pt Diabetic: No Pt smoker: former smoker, quit in 1991, started in his teens  Pt meds include: Statin :No, causes myalgias Betablocker: Yes ASA: Yes Other anticoagulants/antiplatelets: no   Past Medical History:  Diagnosis Date  . Coronary artery disease 2006   stents x2=LAD LCX; totally occluded RCA b. CABG 03/02/17 x2 (left internal mammary artery to LAD, Y-graft right mammary from left mammary to OM).  . Dyslipidemia   . GERD (gastroesophageal reflux disease)   . History of bronchitis   . Hyperlipidemia    statin intolerant  . Hyperparathyroidism   . Hypertension   . Insomnia   . Metatarsalgia   . OA (osteoarthritis)   . Peripheral vascular disease (HCC)   . Seasonal allergies   . Urinary frequency   . Wears glasses     Social History Social History   Tobacco Use  . Smoking status: Former Smoker    Types: Cigarettes    Last attempt to quit: 05/30/1989    Years since quitting: 27.8  . Smokeless tobacco: Never Used  Substance Use Topics  . Alcohol use: Yes    Alcohol/week: 0.0 oz    Comment: one drink a month  . Drug use: No    Family History Family History  Problem Relation Age of Onset  . Arthritis Mother   . Varicose Veins Mother   . Lung  cancer Father   . COPD Father        Lung  . Cancer Brother        Lukemia  . COPD Brother   . Cancer Brother        Prostate    Past Surgical History:  Procedure Laterality Date  . CARDIAC CATHETERIZATION  10/28/2004   circ and mid LAD chyper stents  . CERVICAL FUSION  [id number]  . CERVICAL SPINE SURGERY  2004  . COLONOSCOPY    . CORONARY ANGIOPLASTY  2006   stents placed   . CORONARY ARTERY BYPASS GRAFT N/A 03/02/2017   Procedure: CORONARY ARTERY BYPASS GRAFTING (CABG), ON PUMP, TIMES TWO, USING BILATERAL MAMMARY ARTERIES;  Surgeon: Loreli Slot, MD;  Location: [facility] OR;  Service: Open Heart Surgery;  Laterality: N/A;  . ELBOW ARTHROPLASTY  1994   right  . ENDARTERECTOMY FEMORAL Left 03/17/2015   Procedure: ENDARTERECTOMY COMMON FEMORAL WITH PROFUNDOPLASTY;  Surgeon: Sherren Kerns, MD;  Location: [facility] OR;  Service: Vascular;  Laterality: Left;  . FEMORAL-POPLITEAL BYPASS GRAFT Left 03/17/2015   Procedure: BYPASS GRAFT FEMORAL-POPLITEAL ARTERY-LEFT LEG AND POSTERIOR TIBIAL SEQUENTIAL GRAFT TO BELOW KNEE POPLITEAL ARTERY;  Surgeon: Sherren Kerns, MD;  Location: [facility] OR;  Service: Vascular;  Laterality: Left;  . FEMORAL-POPLITEAL BYPASS GRAFT Right 05/11/2015   Procedure:  RIGHT FEMORAL-BELOW THE KNEE POPLITEAL  ARTERY BYPASS GRAFT USING NON REVERSE RIGHT GREATER SAPHENOUS VEIN;  Surgeon: Sherren Kerns, MD;  Location: [facility] OR;  Service: Vascular;  Laterality: Right;  . INCISION AND DRAINAGE ABSCESS POSTERIOR CERVICALSPINE  2009  . INTRAOPERATIVE ARTERIOGRAM Left 03/17/2015   Procedure: INTRA OPERATIVE ARTERIOGRAM X2;  Surgeon: Sherren Kerns, MD;  Location: [facility] OR;  Service: Vascular;  Laterality: Left;  . INTRAOPERATIVE ARTERIOGRAM Right 05/11/2015   Procedure: INTRA OPERATIVE ARTERIOGRAM;  Surgeon: Sherren Kerns, MD;  Location: [facility] OR;  Service: Vascular;  Laterality: Right;  . LEFT HEART CATH AND CORONARY ANGIOGRAPHY N/A 03/01/2017   Procedure: LEFT HEART CATH AND CORONARY  ANGIOGRAPHY;  Surgeon: Kathleene Hazel, MD;  Location: [facility];  Service: Cardiovascular;  Laterality: N/A;  . PATCH ANGIOPLASTY Left 03/17/2015   Procedure: VEIN PATCH ANGIOPLASTY COMMON FEMORAL ARTERY;  Surgeon: Sherren Kerns, MD;  Location: [facility] OR;  Service: Vascular;  Laterality: Left;  . PERIPHERAL VASCULAR CATHETERIZATION N/A 01/16/2015   Procedure: Abdominal Aortogram;  Surgeon: Sherren Kerns, MD;  Location: [facility] INVASIVE CV LAB;  Service: Cardiovascular;  Laterality: N/A;  . SHOULDER OPEN ROTATOR CUFF REPAIR Right 04/23/2013   Procedure: RIGHT TWO TENDON ROTATOR CUFF REPAIR  SUBACROMIAL DECOMPRESSION AND OPEN DISTAL CLAVICLE RESECTION;  Surgeon: Wyn Forster., MD;  Location: [facility];  Service: Orthopedics;  Laterality: Right;  . stents placed   2006  . TEE WITHOUT CARDIOVERSION N/A 03/02/2017   Procedure: TRANSESOPHAGEAL ECHOCARDIOGRAM (TEE);  Surgeon: Loreli Slot, MD;  Location: [facility] OR;  Service: Open Heart Surgery;  Laterality: N/A;  . VEIN HARVEST Right 05/11/2015   Procedure: WITH NON REVERSE RIGHT GREATER SAPHENOUS VEIN HARVEST;  Surgeon: Sherren Kerns, MD;  Location: [facility] OR;  Service: Vascular;  Laterality: Right;    Allergies  Allergen Reactions  . Atorvastatin     Muscle aches  . Codeine Itching  . Crestor [Rosuvastatin Calcium]     Muscle aches  . Meloxicam Other (See Comments)    Can not take due to kidneys  . Statins Other (See Comments)    myalgia    Current Outpatient Medications  Medication Sig Dispense Refill  . acetaminophen (TYLENOL) 650 MG CR tablet Take 650 mg every 8 (eight) hours as needed by mouth for pain.    Marland Kitchen amiodarone (PACERONE) 400 MG tablet Take 1 tablet (400 mg total) by mouth 2 (two) times daily. For 7 days, then decrease to 400 mg daily (Patient taking differently: Take 400 mg daily by mouth. For 7 days, then decrease to 400 mg daily) 60 tablet 1  . aspirin EC 325 MG EC tablet Take 1 tablet  (325 mg total) by mouth daily. 30 tablet 0  . calcitRIOL (ROCALTROL) 0.5 MCG capsule Take 0.5 mcg by mouth daily.    . calcium carbonate (OSCAL) 1500 (600 Ca) MG TABS tablet Take 600 mg of elemental calcium 2 (two) times daily with a meal by mouth. Patient takes 2 in the morning and 1 in the evening.    . Cyanocobalamin (VITAMIN B-12) 2500 MCG SUBL Place 2,500 mcg under the tongue daily.    Marland Kitchen diltiazem (CARDIZEM CD) 120 MG 24 hr capsule Take 1 capsule (120 mg total) by mouth daily. 30 capsule 3  . ezetimibe (ZETIA) 10 MG tablet Take 10 mg by mouth daily.      . fenofibrate 160 MG tablet Take 160 mg by mouth daily.    . fluticasone (FLONASE) 50 MCG/ACT nasal spray  Place 2 sprays into both nostrils daily as needed for allergies or rhinitis.    Marland Kitchen. gabapentin (NEURONTIN) 300 MG capsule Take 300 mg by mouth every evening.   6  . losartan (COZAAR) 100 MG tablet Take 100 mg by mouth every evening.     . metoprolol tartrate (LOPRESSOR) 25 MG tablet Take 1 tablet (25 mg total) by mouth 2 (two) times daily. 60 tablet 3  . nitroGLYCERIN (NITROSTAT) 0.4 MG SL tablet Place 1 tablet (0.4 mg total) under the tongue every 5 (five) minutes as needed for chest pain. 25 tablet 12  . Omega-3 Fatty Acids (OMEGA-3 EPA FISH OIL PO) Take 3,210 mg by mouth 2 (two) times daily.    . ranitidine (ZANTAC) 150 MG tablet Take 150 mg by mouth 2 (two) times daily.    . traZODone (DESYREL) 50 MG tablet Take 50 mg by mouth at bedtime.    . TURMERIC PO Take by mouth.     No current facility-administered medications for this visit.     ROS: See HPI for pertinent positives and negatives.   Physical Examination  Vitals:   04/13/17 1448 04/13/17 1450  BP: 128/67 125/66  Pulse: (!) 54   Resp: 18   Temp: (!) 97.5 F (36.4 C)   TempSrc: Oral   SpO2: 97%   Weight: 213 lb 12.8 oz (97 kg)    Body mass index is 28.21 kg/m.  General: A&O x 3, WDWN, male. Gait: normal Eyes: PERRLA. Pulmonary: Respirations are non labored,  CTAB, good air movement Cardiac: regular rhythm, no detected murmur.         Carotid Bruits Right Left   Negative Negative   Radial pulses are 2+ palpable bilaterally   Adominal aortic pulse is not palpable                         VASCULAR EXAM: Extremities without ischemic changes, without Gangrene; without open wounds.                                                                                                          LE Pulses Right Left       FEMORAL  2+ palpable  2+ palpable        POPLITEAL  2+ palpable   2+ palpable       POSTERIOR TIBIAL  not palpable   1+ palpable        DORSALIS PEDIS      ANTERIOR TIBIAL 2+ palpable  2+ palpable    Abdomen: soft, NT, no palpable masses. Skin: no rashes, no ulcers noted. Musculoskeletal: no muscle wasting or atrophy.  Neurologic: A&O X 3; Appropriate Affect ; SENSATION: normal; MOTOR FUNCTION:  moving all extremities equally, motor strength 5/5 throughout. Speech is fluent/normal. CN 2-12 intact.    ASSESSMENT: Christoper AllegraSteven R Breisch is a 61 y.o. male who is s/p left femoral to posterior tibial bypass and a sequential graft to the below-knee popliteal artery on March 17, 2015. He subsequently underwent right femoral to below-knee  popliteal bypass using saphenous vein on May 11, 2015.  He is also s/p bilateral CIA stents placed on 01-16-15.  He no longer has claudication symptoms with walking and walks a great deal. There are no signs of ischemia in his feet or legs.   DATA  Bilateral LE Arterial Duplex (11/15/180:  Patent bilateral lower extremity bypass grafts with no evidence for restenosis.  No significant change compared to the last exam on 04-07-16.   ABI (Date: 04/13/2017):  R:   ABI: 1.12 (was 0.93 on 04-07-16),   PT: tri  DP: tri  TBI:  0.29 (was 0.82)  L:   ABI: 1.13 (was 0.93),   PT: tri  DP: tri  TBI: 0.75 (was 0.75) Improved bilateral ABI from mild disease to normal with all triphasic  waveforms. Decline in right TBI, stable left TBI.      PLAN:  Based on the patient's vascular studies and examination, pt will return to clinic in 1 year with bilateral LE arterial duplex and ABI's. I advised pt to notify us if he develops concerns re the circulation in his feet or legs.    I discussed in depth with the patient the nature of atherosclerosis, and emphasized the importance of maximal medical management including strict control of blood pressure, blood glucose, and lipid levels, obtaining regular exercise, and continued cessation of smoking.  The patient is aware that without maximal medical management the underlying atherosclerotic disease process will progress, limiting the benefit of any interventions.  The patient was given information about PAD including signs, symptoms, treatment, what symptoms should prompt the patient to seek immediate medical care, and risk reduction measures to take.  Charisse MarchSuzanne Sun Wilensky, RN, MSN, [name]-C [facility] [place] Office Phone: [phone]  [facility]  04/13/17 3:19 PM

## 2017-04-13 NOTE — Patient Instructions (Signed)

## 2017-04-14 ENCOUNTER — Encounter (HOSPITAL_COMMUNITY)
Admission: RE | Admit: 2017-04-14 | Discharge: 2017-04-14 | Disposition: A | Discharge status: Home / Self Care | Payer: BLUE CROSS/BLUE SHIELD | Source: Ambulatory Visit | Attending: Cardiovascular Disease | Admitting: Cardiovascular Disease

## 2017-04-14 ENCOUNTER — Encounter (HOSPITAL_COMMUNITY): Payer: BLUE CROSS/BLUE SHIELD

## 2017-04-14 DIAGNOSIS — Z48812 Encounter for surgical aftercare following surgery on the circulatory system: Secondary | ICD-10-CM | POA: Diagnosis not present

## 2017-04-14 DIAGNOSIS — Z951 Presence of aortocoronary bypass graft: Secondary | ICD-10-CM

## 2017-04-14 NOTE — Progress Notes (Signed)
Reviewed home exercise guidelines with patient including endpoints, temperature precautions, target heart rate and rate of perceived exertion. Pt is currently walking 25 minutes daily as his mode of home exercise. Pt voices understanding of instructions given. Artist Paislinty M Jamieon Lannen, MS, ACSM CEP

## 2017-04-14 NOTE — Progress Notes (Signed)
Daily Session Note  Patient Details  Name: Derrick [name] MRN: [id number] Date of Birth: 05-12-1956 Referring Provider:     [place] from 04/06/2017 in [place]  Referring Provider  Mertie Moores MD      Encounter Date: 04/14/2017  Check In: Session Check In - 04/14/17 1505      Check-In   Location  [facility]    Staff Present  Andi Hence, RN, Deland Pretty, MS, ACSM CEP, Exercise Physiologist    Supervising physician immediately available to respond to emergencies  [facility] immediately available    Physician(s)  Dr. Broadus John    Medication changes reported      No    Fall or balance concerns reported     No    Tobacco Cessation  No Change    Warm-up and Cool-down  Performed as group-led instruction    Resistance Training Performed  Yes    VAD Patient?  No      Pain Assessment   Currently in Pain?  No/denies       Capillary Blood Glucose: No results found for this or any previous visit (from the past 24 hour(s)).    Social History   Tobacco Use  Smoking Status Former Smoker  . Types: Cigarettes  . Last attempt to quit: 05/30/1989  . Years since quitting: 27.8  Smokeless Tobacco Never Used    Goals Met:  Exercise tolerated well  Goals Unmet:  BP  Comments: pt hypertensive at cardiac rehab.  Resting BP:  150/78.  Peak with exercise 170/68.  Post BP:  120/68.  Pt asymptomatic.  Pt admits to eating fast food with added salt. Pt did take all medications as directed. Pt instructed to follow strict low sodium diet, increase PO fluid intake today and take medications as directed. Pt verbalized understanding.   Exercise workloads decreased due to hypertension. Andi Hence, RN, BSN Cardiac Pulmonary Rehab    Dr. Fransico Him is Medical Director for Cardiac Rehab at [facility].

## 2017-04-17 ENCOUNTER — Encounter (HOSPITAL_COMMUNITY): Payer: BLUE CROSS/BLUE SHIELD

## 2017-04-17 ENCOUNTER — Encounter (HOSPITAL_COMMUNITY)
Admission: RE | Admit: 2017-04-17 | Discharge: 2017-04-17 | Disposition: A | Discharge status: Home / Self Care | Payer: BLUE CROSS/BLUE SHIELD | Source: Ambulatory Visit | Attending: Cardiovascular Disease | Admitting: Cardiovascular Disease

## 2017-04-17 DIAGNOSIS — Z48812 Encounter for surgical aftercare following surgery on the circulatory system: Secondary | ICD-10-CM | POA: Diagnosis not present

## 2017-04-17 NOTE — Addendum Note (Signed)
Addended by: Burton ApleyPETTY, Berenice Oehlert A on: 04/17/2017 04:00 PM   Modules accepted: Orders

## 2017-04-19 ENCOUNTER — Encounter (HOSPITAL_COMMUNITY): Payer: BLUE CROSS/BLUE SHIELD

## 2017-04-19 ENCOUNTER — Encounter (HOSPITAL_COMMUNITY)
Admission: RE | Admit: 2017-04-19 | Discharge: 2017-04-19 | Disposition: A | Discharge status: Home / Self Care | Payer: BLUE CROSS/BLUE SHIELD | Source: Ambulatory Visit | Attending: Cardiovascular Disease | Admitting: Cardiovascular Disease

## 2017-04-19 DIAGNOSIS — Z48812 Encounter for surgical aftercare following surgery on the circulatory system: Secondary | ICD-10-CM | POA: Diagnosis not present

## 2017-04-19 DIAGNOSIS — Z951 Presence of aortocoronary bypass graft: Secondary | ICD-10-CM

## 2017-04-24 ENCOUNTER — Encounter (HOSPITAL_COMMUNITY): Payer: BLUE CROSS/BLUE SHIELD

## 2017-04-24 ENCOUNTER — Other Ambulatory Visit: Payer: Self-pay | Admitting: Cardiovascular Disease

## 2017-04-24 ENCOUNTER — Encounter (HOSPITAL_COMMUNITY)
Admission: RE | Admit: 2017-04-24 | Discharge: 2017-04-24 | Disposition: A | Discharge status: Home / Self Care | Payer: BLUE CROSS/BLUE SHIELD | Source: Ambulatory Visit | Attending: Cardiovascular Disease | Admitting: Cardiovascular Disease

## 2017-04-24 DIAGNOSIS — Z951 Presence of aortocoronary bypass graft: Secondary | ICD-10-CM

## 2017-04-24 DIAGNOSIS — Z48812 Encounter for surgical aftercare following surgery on the circulatory system: Secondary | ICD-10-CM | POA: Diagnosis not present

## 2017-04-24 MED ORDER — METOPROLOL TARTRATE 25 MG PO TABS: 25.0000 mg | ORAL_TABLET | Freq: Two times a day (BID) | ORAL | 0 refills | Status: DC

## 2017-04-24 MED ORDER — DILTIAZEM HCL ER COATED BEADS 120 MG PO CP24: 120.0000 mg | ORAL_CAPSULE | Freq: Every day | ORAL | 0 refills | Status: DC

## 2017-04-24 NOTE — Telephone Encounter (Signed)
Pt's medication was sent to pt's pharmacy as requested. Confirmation received.  °

## 2017-04-26 ENCOUNTER — Encounter (HOSPITAL_COMMUNITY)
Admission: RE | Admit: 2017-04-26 | Discharge: 2017-04-26 | Disposition: A | Discharge status: Home / Self Care | Payer: BLUE CROSS/BLUE SHIELD | Source: Ambulatory Visit | Attending: Cardiovascular Disease | Admitting: Cardiovascular Disease

## 2017-04-26 ENCOUNTER — Encounter (HOSPITAL_COMMUNITY): Payer: BLUE CROSS/BLUE SHIELD

## 2017-04-26 DIAGNOSIS — Z951 Presence of aortocoronary bypass graft: Secondary | ICD-10-CM

## 2017-04-26 DIAGNOSIS — Z48812 Encounter for surgical aftercare following surgery on the circulatory system: Secondary | ICD-10-CM | POA: Diagnosis not present

## 2017-04-27 NOTE — Progress Notes (Signed)
Cardiac Individual Treatment Plan  Patient Details  Name: Derrick [name] MRN: [id number] Date of Birth: 1956/02/04 Referring Provider:     CARDIAC REHAB PHASE II ORIENTATION from 04/06/2017 in [facility] CARDIAC [facility]  Referring Provider  Nahser, Philip MD      Initial Encounter Date:    CARDIAC REHAB PHASE II ORIENTATION from 04/06/2017 in [facility] CARDIAC REHAB  Date  04/06/17  Referring Provider  Nahser, Loistine Chance MD      Visit Diagnosis: S/P CABG x 2  Patient's Home Medications on Admission:  Current Outpatient Medications:  .  acetaminophen (TYLENOL) 650 MG CR tablet, Take 650 mg every 8 (eight) hours as needed by mouth for pain., Disp: , Rfl:  .  amiodarone (PACERONE) 400 MG tablet, Take 1 tablet (400 mg total) by mouth 2 (two) times daily. For 7 days, then decrease to 400 mg daily (Patient taking differently: Take 400 mg daily by mouth. For 7 days, then decrease to 400 mg daily), Disp: 60 tablet, Rfl: 1 .  aspirin EC 325 MG EC tablet, Take 1 tablet (325 mg total) by mouth daily., Disp: 30 tablet, Rfl: 0 .  calcitRIOL (ROCALTROL) 0.5 MCG capsule, Take 0.5 mcg by mouth daily., Disp: , Rfl:  .  calcium carbonate (OSCAL) 1500 (600 Ca) MG TABS tablet, Take 600 mg of elemental calcium 2 (two) times daily with a meal by mouth. Patient takes 2 in the morning and 1 in the evening., Disp: , Rfl:  .  Cyanocobalamin (VITAMIN B-12) 2500 MCG SUBL, Place 2,500 mcg under the tongue daily., Disp: , Rfl:  .  diltiazem (CARDIZEM CD) 120 MG 24 hr capsule, Take 1 capsule (120 mg total) by mouth daily., Disp: 90 capsule, Rfl: 0 .  ezetimibe (ZETIA) 10 MG tablet, Take 10 mg by mouth daily.  , Disp: , Rfl:  .  fenofibrate 160 MG tablet, Take 160 mg by mouth daily., Disp: , Rfl:  .  fluticasone (FLONASE) 50 MCG/ACT nasal spray, Place 2 sprays into both nostrils daily as needed for allergies or rhinitis., Disp: , Rfl:  .  gabapentin (NEURONTIN) 300 MG capsule, Take 300  mg by mouth every evening. , Disp: , Rfl: 6 .  losartan (COZAAR) 100 MG tablet, Take 100 mg by mouth every evening. , Disp: , Rfl:  .  metoprolol tartrate (LOPRESSOR) 25 MG tablet, Take 1 tablet (25 mg total) by mouth 2 (two) times daily., Disp: 180 tablet, Rfl: 0 .  nitroGLYCERIN (NITROSTAT) 0.4 MG SL tablet, Place 1 tablet (0.4 mg total) under the tongue every 5 (five) minutes as needed for chest pain., Disp: 25 tablet, Rfl: 12 .  Omega-3 Fatty Acids (OMEGA-3 EPA FISH OIL PO), Take 3,210 mg by mouth 2 (two) times daily., Disp: , Rfl:  .  ranitidine (ZANTAC) 150 MG tablet, Take 150 mg by mouth 2 (two) times daily., Disp: , Rfl:  .  traZODone (DESYREL) 50 MG tablet, Take 50 mg by mouth at bedtime., Disp: , Rfl:  .  TURMERIC PO, Take by mouth., Disp: , Rfl:   Past Medical History: Past Medical History:  Diagnosis Date  . Coronary artery disease 2006   stents x2=LAD LCX; totally occluded RCA b. CABG 03/02/17 x2 (left internal mammary artery to LAD, Y-graft right mammary from left mammary to OM).  . Dyslipidemia   . GERD (gastroesophageal reflux disease)   . History of bronchitis   . Hyperlipidemia    statin intolerant  . Hyperparathyroidism   .  Hypertension   . Insomnia   . Metatarsalgia   . OA (osteoarthritis)   . Peripheral vascular disease (HCC)   . Seasonal allergies   . Urinary frequency   . Wears glasses     Tobacco Use: Social History   Tobacco Use  Smoking Status Former Smoker  . Types: Cigarettes  . Last attempt to quit: 05/30/1989  . Years since quitting: 27.9  Smokeless Tobacco Never Used    Labs: Recent Hydrographic surveyor    Labs for ITP Cardiac and Pulmonary Rehab Latest Ref Rng & Units 03/02/2017 03/02/2017 03/03/2017 03/03/2017 03/03/2017   Hemoglobin A1c 4.8 - 5.6 % - - - - -   PHART 7.350 - 7.450 - 7.285(L) 7.298(L) - -   PCO2ART 32.0 - 48.0 mmHg - 49.5(H) 46.7 - -   HCO3 20.0 - 28.0 mmol/L - 23.4 22.7 - -   TCO2 22 - 32 mmol/L 24 25 24 24 25    ACIDBASEDEF  0.0 - 2.0 mmol/L - 3.0(H) 4.0(H) - -   O2SAT % - 99.0 98.0 - -      Capillary Blood Glucose: Lab Results  Component Value Date   GLUCAP 121 (H) 03/07/2017   GLUCAP 125 (H) 03/06/2017   GLUCAP 107 (H) 03/06/2017   GLUCAP 114 (H) 03/06/2017   GLUCAP 133 (H) 03/05/2017     Exercise Target Goals:    Exercise Program Goal: Individual exercise prescription set with THRR, safety & activity barriers. Participant demonstrates ability to understand and report RPE using BORG scale, to self-measure pulse accurately, and to acknowledge the importance of the exercise prescription.  Exercise Prescription Goal: Starting with aerobic activity 30 plus minutes a day, 3 days per week for initial exercise prescription. Provide home exercise prescription and guidelines that participant acknowledges understanding prior to discharge.  Activity Barriers & Risk Stratification: Activity Barriers & Cardiac Risk Stratification - 04/06/17 0852      Activity Barriers & Cardiac Risk Stratification   Activity Barriers  Joint Problems;Deconditioning;Muscular Weakness;Arthritis;Neck/Spine Problems;Other (comment)    Comments  R RTC, R elbow surgery and PVD    Cardiac Risk Stratification  High       6 Minute Walk: 6 Minute Walk    Row Name 04/06/17 1203         6 Minute Walk   Phase  Initial     Distance  1265 feet     Walk Time  6 minutes     # of Rest Breaks  0     MPH  2.4     METS  3.05     RPE  13     VO2 Peak  10.66     Symptoms  No     Resting HR  56 bpm     Resting BP  118/60     Resting Oxygen Saturation   99 %     Exercise Oxygen Saturation  during 6 min walk  100 %     Max Ex. HR  70 bpm     Max Ex. BP  138/58     2 Minute Post BP  110/58        Oxygen Initial Assessment:   Oxygen Re-Evaluation:   Oxygen Discharge (Final Oxygen Re-Evaluation):   Initial Exercise Prescription: Initial Exercise Prescription - 04/06/17 1200      Date of Initial Exercise RX and Referring  Provider   Date  04/06/17    Referring Provider  Nahser, Loistine Chance MD  Treadmill   MPH  2.4    Grade  0    Minutes  10    METs  2.84      Bike   Level  0.7    Minutes  10    METs  2.39      NuStep   Level  3    SPM  80    Minutes  10    METs  2      Prescription Details   Frequency (times per week)  3    Duration  Progress to 30 minutes of continuous aerobic without signs/symptoms of physical distress      Intensity   THRR 40-80% of Max Heartrate  63-125    Ratings of Perceived Exertion  11-13    Perceived Dyspnea  0-4      Progression   Progression  Continue to progress workloads to maintain intensity without signs/symptoms of physical distress.      Resistance Training   Training Prescription  Yes    Weight  2lbs    Reps  10-15       Perform Capillary Blood Glucose checks as needed.  Exercise Prescription Changes:  Exercise Prescription Changes    Row Name 04/12/17 1502 04/14/17 1600 04/26/17 1600         Response to Exercise   Blood Pressure (Admit)  128/62  158/80  158/76     Blood Pressure (Exercise)  144/78  176/70  146/68     Blood Pressure (Exit)  108/60  128/68  123/60     Heart Rate (Admit)  63 bpm  67 bpm  60 bpm     Heart Rate (Exercise)  87 bpm  98 bpm  90 bpm     Heart Rate (Exit)  67 bpm  66 bpm  53 bpm     Rating of Perceived Exertion (Exercise)  13  12  12      Symptoms  none  none  none     Duration  Progress to 30 minutes of  aerobic without signs/symptoms of physical distress  Progress to 30 minutes of  aerobic without signs/symptoms of physical distress  Progress to 30 minutes of  aerobic without signs/symptoms of physical distress     Intensity  THRR unchanged  THRR unchanged  THRR unchanged       Progression   Progression  Continue to progress workloads to maintain intensity without signs/symptoms of physical distress.  Continue to progress workloads to maintain intensity without signs/symptoms of physical distress.  Continue to  progress workloads to maintain intensity without signs/symptoms of physical distress.     Average METs  2.5  2.7  3.3       Resistance Training   Training Prescription  Yes  Yes  No Relaxation today     Weight  2lbs  2lbs  -     Reps  10-15  10-15  -     Time  10 Minutes  10 Minutes  -       Interval Training   Interval Training  No  No  No       Treadmill   MPH  2.4  2.4  -     Grade  0  0  -     Minutes  10  10  -     METs  2.84  2.84  -       Bike   Level  0.7  -  1.5  Minutes  10  -  10     METs  2.39  -  3.9       NuStep   Level  3  4  5      SPM  80  80  85     Minutes  10  10  10      METs  2.4  2.8  3.6       Track   Laps  -  8 Track instead of Airdyne today because of high BP.  8     Minutes  -  10  10     METs  -  2.39  2.39       Home Exercise Plan   Plans to continue exercise at  -  Home (comment)  Home (comment)     Frequency  -  Add 4 additional days to program exercise sessions.  Add 4 additional days to program exercise sessions.     Initial Home Exercises Provided  -  04/14/17  04/14/17        Exercise Comments:  Exercise Comments    Row Name 04/14/17 1606 04/26/17 1504         Exercise Comments  Reviewed exercise prescription, METs and goals with patient.  Reviewed METs with patient.         Exercise Goals and Review:  Exercise Goals    Row Name 04/06/17 0853             Exercise Goals   Increase Physical Activity  Yes       Intervention  Provide advice, education, support and counseling about physical activity/exercise needs.;Develop an individualized exercise prescription for aerobic and resistive training based on initial evaluation findings, risk stratification, comorbidities and participant's personal goals.       Expected Outcomes  Achievement of increased cardiorespiratory fitness and enhanced flexibility, muscular endurance and strength shown through measurements of functional capacity and personal statement of participant.        Increase Strength and Stamina  Yes increase strength in legs and muscles throughout       Intervention  Provide advice, education, support and counseling about physical activity/exercise needs.;Develop an individualized exercise prescription for aerobic and resistive training based on initial evaluation findings, risk stratification, comorbidities and participant's personal goals.       Expected Outcomes  Achievement of increased cardiorespiratory fitness and enhanced flexibility, muscular endurance and strength shown through measurements of functional capacity and personal statement of participant.       Able to understand and use rate of perceived exertion (RPE) scale  Yes       Intervention  Provide education and explanation on how to use RPE scale       Expected Outcomes  Long Term:  Able to use RPE to guide intensity level when exercising independently;Short Term: Able to use RPE daily in rehab to express subjective intensity level       Knowledge and understanding of Target Heart Rate Range (THRR)  Yes       Intervention  Provide education and explanation of THRR including how the numbers were predicted and where they are located for reference       Expected Outcomes  Short Term: Able to state/look up THRR;Long Term: Able to use THRR to govern intensity when exercising independently;Short Term: Able to use daily as guideline for intensity in rehab       Able to check pulse independently  Yes  Intervention  Provide education and demonstration on how to check pulse in carotid and radial arteries.;Review the importance of being able to check your own pulse for safety during independent exercise       Expected Outcomes  Short Term: Able to explain why pulse checking is important during independent exercise;Long Term: Able to check pulse independently and accurately       Understanding of Exercise Prescription  Yes       Intervention  Provide education, explanation, and written materials on  patient's individual exercise prescription       Expected Outcomes  Short Term: Able to explain program exercise prescription;Long Term: Able to explain home exercise prescription to exercise independently          Exercise Goals Re-Evaluation : Exercise Goals Re-Evaluation    Row Name 04/14/17 1603             Exercise Goal Re-Evaluation   Exercise Goals Review  Increase Physical Activity;Understanding of Exercise Prescription;Knowledge and understanding of Target Heart Rate Range (THRR);Able to understand and use rate of perceived exertion (RPE) scale       Comments  Reviewed home exericse guidelines with patient including THRR, RPE scale and endpoints for exercise. Pt is walking 25 minutes daily. Encouraged pt to increase duration to 30 minutes, and pt is amenable to this.       Expected Outcomes  Continue walking at home in addition to exercise at cardiac rehab to help achieve health and fitness benefits.           Discharge Exercise Prescription (Final Exercise Prescription Changes): Exercise Prescription Changes - 04/26/17 1600      Response to Exercise   Blood Pressure (Admit)  158/76    Blood Pressure (Exercise)  146/68    Blood Pressure (Exit)  123/60    Heart Rate (Admit)  60 bpm    Heart Rate (Exercise)  90 bpm    Heart Rate (Exit)  53 bpm    Rating of Perceived Exertion (Exercise)  12    Symptoms  none    Duration  Progress to 30 minutes of  aerobic without signs/symptoms of physical distress    Intensity  THRR unchanged      Progression   Progression  Continue to progress workloads to maintain intensity without signs/symptoms of physical distress.    Average METs  3.3      Resistance Training   Training Prescription  No Relaxation today    Weight  --    Reps  --    Time  --      Interval Training   Interval Training  No      Treadmill   MPH  --    Grade  --    Minutes  --    METs  --      Bike   Level  1.5    Minutes  10    METs  3.9       NuStep   Level  5    SPM  85    Minutes  10    METs  3.6      Track   Laps  8    Minutes  10    METs  2.39      Home Exercise Plan   Plans to continue exercise at  Home (comment)    Frequency  Add 4 additional days to program exercise sessions.    Initial Home Exercises Provided  04/14/17  Nutrition:  Target Goals: Understanding of nutrition guidelines, daily intake of sodium 1500mg , cholesterol 200mg , calories 30% from fat and 7% or less from saturated fats, daily to have 5 or more servings of fruits and vegetables.  Biometrics: Pre Biometrics - 04/06/17 1207      Pre Biometrics   Waist Circumference  40 inches    Hip Circumference  40.25 inches    Waist to Hip Ratio  0.99 %    Triceps Skinfold  17 mm    % Body Fat  27.4 %    Grip Strength  37 kg    Flexibility  7.5 in    Single Leg Stand  15.23 seconds        Nutrition Therapy Plan and Nutrition Goals: Nutrition Therapy & Goals - 04/06/17 1241      Nutrition Therapy   Diet  Therapeutic Lifestyle Changes      Personal Nutrition Goals   Nutrition Goal  Maintain wt of ~ 210 lb while in rehab.     Personal Goal #2  Pt to identify and limit food sources of saturated fat, trans fat, and sodium.       Intervention Plan   Intervention  Prescribe, educate and counsel regarding individualized specific dietary modifications aiming towards targeted core components such as weight, hypertension, lipid management, diabetes, heart failure and other comorbidities.    Expected Outcomes  Short Term Goal: Understand basic principles of dietary content, such as calories, fat, sodium, cholesterol and nutrients.;Long Term Goal: Adherence to prescribed nutrition plan.       Nutrition Discharge: Nutrition Scores: Nutrition Assessments - 04/06/17 1242      MEDFICTS Scores   Pre Score  50       Nutrition Goals Re-Evaluation:   Nutrition Goals Re-Evaluation:   Nutrition Goals Discharge (Final Nutrition Goals  Re-Evaluation):   Psychosocial: Target Goals: Acknowledge presence or absence of significant depression and/or stress, maximize coping skills, provide positive support system. Participant is able to verbalize types and ability to use techniques and skills needed for reducing stress and depression.  Initial Review & Psychosocial Screening: Initial Psych Review & Screening - 04/06/17 1322      Initial Review   Current issues with  None Identified      Family Dynamics   Good Support System?  Yes      Barriers   Psychosocial barriers to participate in program  There are no identifiable barriers or psychosocial needs.      Screening Interventions   Interventions  Encouraged to exercise       Quality of Life Scores: Quality of Life - 04/06/17 1212      Quality of Life Scores   Health/Function Pre  26.23 %    Socioeconomic Pre  26.86 %    Psych/Spiritual Pre  27.64 %    Family Pre  28.13 %    GLOBAL Pre  26.89 %       PHQ-9: Recent Review Flowsheet Data    Depression screen [facility] 2/9 04/10/2017   Decreased Interest 0   Down, Depressed, Hopeless 0   PHQ - 2 Score 0     Interpretation of Total Score  Total Score Depression Severity:  1-4 = Minimal depression, 5-9 = Mild depression, 10-14 = Moderate depression, 15-19 = Moderately severe depression, 20-27 = Severe depression   Psychosocial Evaluation and Intervention:   Psychosocial Re-Evaluation: Psychosocial Re-Evaluation    Row Name 04/27/17 1421  Psychosocial Re-Evaluation   Current issues with  None Identified       Interventions  Encouraged to attend Cardiac Rehabilitation for the exercise       Continue Psychosocial Services   No Follow up required          Psychosocial Discharge (Final Psychosocial Re-Evaluation): Psychosocial Re-Evaluation - 04/27/17 1421      Psychosocial Re-Evaluation   Current issues with  None Identified    Interventions  Encouraged to attend Cardiac Rehabilitation for  the exercise    Continue Psychosocial Services   No Follow up required       Vocational Rehabilitation: Provide vocational rehab assistance to qualifying candidates.   Vocational Rehab Evaluation & Intervention: Vocational Rehab - 04/06/17 1322      Initial Vocational Rehab Evaluation & Intervention   Assessment shows need for Vocational Rehabilitation  No       Education: Education Goals: Education classes will be provided on a weekly basis, covering required topics. Participant will state understanding/return demonstration of topics presented.  Learning Barriers/Preferences: Learning Barriers/Preferences - 04/06/17 0850      Learning Barriers/Preferences   Learning Barriers  Sight    Learning Preferences  Individual Instruction;Skilled Demonstration;Written Material       Education Topics: Count Your Pulse:  -Group instruction provided by verbal instruction, demonstration, patient participation and written materials to support subject.  Instructors address importance of being able to find your pulse and how to count your pulse when at home without a heart monitor.  Patients get hands on experience counting their pulse with staff help and individually.   Heart Attack, Angina, and Risk Factor Modification:  -Group instruction provided by verbal instruction, video, and written materials to support subject.  Instructors address signs and symptoms of angina and heart attacks.    Also discuss risk factors for heart disease and how to make changes to improve heart health risk factors.   Functional Fitness:  -Group instruction provided by verbal instruction, demonstration, patient participation, and written materials to support subject.  Instructors address safety measures for doing things around the house.  Discuss how to get up and down off the floor, how to pick things up properly, how to safely get out of a chair without assistance, and balance training.   CARDIAC REHAB PHASE II  EXERCISE from 04/26/2017 in [facility] CARDIAC REHAB  Date  04/14/17  Instruction Review Code  2- meets goals/outcomes      Meditation and Mindfulness:  -Group instruction provided by verbal instruction, patient participation, and written materials to support subject.  Instructor addresses importance of mindfulness and meditation practice to help reduce stress and improve awareness.  Instructor also leads participants through a meditation exercise.    Stretching for Flexibility and Mobility:  -Group instruction provided by verbal instruction, patient participation, and written materials to support subject.  Instructors lead participants through series of stretches that are designed to increase flexibility thus improving mobility.  These stretches are additional exercise for major muscle groups that are typically performed during regular warm up and cool down.   Hands Only CPR:  -Group verbal, video, and participation provides a basic overview of AHA guidelines for community CPR. Role-play of emergencies allow participants the opportunity to practice calling for help and chest compression technique with discussion of AED use.   Hypertension: -Group verbal and written instruction that provides a basic overview of hypertension including the most recent diagnostic guidelines, risk factor reduction with self-care instructions and medication management.  Nutrition I class: Heart Healthy Eating:  -Group instruction provided by PowerPoint slides, verbal discussion, and written materials to support subject matter. The instructor gives an explanation and review of the Therapeutic Lifestyle Changes diet recommendations, which includes a discussion on lipid goals, dietary fat, sodium, fiber, plant stanol/sterol esters, sugar, and the components of a well-balanced, healthy diet.   Nutrition II class: Lifestyle Skills:  -Group instruction provided by PowerPoint slides, verbal  discussion, and written materials to support subject matter. The instructor gives an explanation and review of label reading, grocery shopping for heart health, heart healthy recipe modifications, and ways to make healthier choices when eating out.   Diabetes Question & Answer:  -Group instruction provided by PowerPoint slides, verbal discussion, and written materials to support subject matter. The instructor gives an explanation and review of diabetes co-morbidities, pre- and post-prandial blood glucose goals, pre-exercise blood glucose goals, signs, symptoms, and treatment of hypoglycemia and hyperglycemia, and foot care basics.   Diabetes Blitz:  -Group instruction provided by PowerPoint slides, verbal discussion, and written materials to support subject matter. The instructor gives an explanation and review of the physiology behind type 1 and type 2 diabetes, diabetes medications and rational behind using different medications, pre- and post-prandial blood glucose recommendations and Hemoglobin A1c goals, diabetes diet, and exercise including blood glucose guidelines for exercising safely.    Portion Distortion:  -Group instruction provided by PowerPoint slides, verbal discussion, written materials, and food models to support subject matter. The instructor gives an explanation of serving size versus portion size, changes in portions sizes over the last 20 years, and what consists of a serving from each food group.   Stress Management:  -Group instruction provided by verbal instruction, video, and written materials to support subject matter.  Instructors review role of stress in heart disease and how to cope with stress positively.     Exercising on Your Own:  -Group instruction provided by verbal instruction, power point, and written materials to support subject.  Instructors discuss benefits of exercise, components of exercise, frequency and intensity of exercise, and end points for  exercise.  Also discuss use of nitroglycerin and activating EMS.  Review options of places to exercise outside of rehab.  Review guidelines for sex with heart disease.   Cardiac Drugs I:  -Group instruction provided by verbal instruction and written materials to support subject.  Instructor reviews cardiac drug classes: antiplatelets, anticoagulants, beta blockers, and statins.  Instructor discusses reasons, side effects, and lifestyle considerations for each drug class.   CARDIAC REHAB PHASE II EXERCISE from 04/26/2017 in [facility]  Date  04/12/17  Instruction Review Code  2- meets goals/outcomes      Cardiac Drugs II:  -Group instruction provided by verbal instruction and written materials to support subject.  Instructor reviews cardiac drug classes: angiotensin converting enzyme inhibitors (ACE-I), angiotensin II receptor blockers (ARBs), nitrates, and calcium channel blockers.  Instructor discusses reasons, side effects, and lifestyle considerations for each drug class.   Anatomy and Physiology of the Circulatory System:  Group verbal and written instruction and models provide basic cardiac anatomy and physiology, with the coronary electrical and arterial systems. Review of: AMI, Angina, Valve disease, Heart Failure, Peripheral Artery Disease, Cardiac Arrhythmia, Pacemakers, and the ICD.   CARDIAC REHAB PHASE [facility]  Date  04/26/17  Instruction Review Code  2- meets goals/outcomes      Other Education:  -Group or  individual verbal, written, or video instructions that support the educational goals of the cardiac rehab program.   Knowledge Questionnaire Score: Knowledge Questionnaire Score - 04/06/17 1159      Knowledge Questionnaire Score   Pre Score  15/24       Core Components/Risk Factors/Patient Goals at Admission: Personal Goals and Risk Factors at Admission - 04/06/17 1216       Core Components/Risk Factors/Patient Goals on Admission    Weight Management  Yes;Weight Maintenance;Weight Loss    Intervention  Weight Management: Develop a combined nutrition and exercise program designed to reach desired caloric intake, while maintaining appropriate intake of nutrient and fiber, sodium and fats, and appropriate energy expenditure required for the weight goal.;Weight Management: Provide education and appropriate resources to help participant work on and attain dietary goals.;Weight Management/Obesity: Establish reasonable short term and long term weight goals.    Admit Weight  209 lb 14.1 oz (95.2 kg)    Goal Weight: Short Term  205 lb (93 kg)    Goal Weight: Long Term  200 lb (90.7 kg)    Expected Outcomes  Short Term: Continue to assess and modify interventions until short term weight is achieved;Long Term: Adherence to nutrition and physical activity/exercise program aimed toward attainment of established weight goal;Weight Maintenance: Understanding of the daily nutrition guidelines, which includes 25-35% calories from fat, 7% or less cal from saturated fats, less than 200mg  cholesterol, less than 1.5gm of sodium, & 5 or more servings of fruits and vegetables daily;Weight Loss: Understanding of general recommendations for a balanced deficit meal plan, which promotes 1-2 lb weight loss per week and includes a negative energy balance of [phone] kcal/d;Understanding recommendations for meals to include 15-35% energy as protein, 25-35% energy from fat, 35-60% energy from carbohydrates, less than 200mg  of dietary cholesterol, 20-35 gm of total fiber daily;Understanding of distribution of calorie intake throughout the day with the consumption of 4-5 meals/snacks       Core Components/Risk Factors/Patient Goals Review:  Goals and Risk Factor Review    Row Name 04/27/17 1422             Core Components/Risk Factors/Patient Goals Review   Personal Goals Review  Weight  Management/Obesity;Lipids;Hypertension       Review  Derrick [name] is doing well with exercise at cardiac rehab. Derrick [name] has had some intermittent exertional BP elevations.       Expected Outcomes  Derrick [name] will continue to come to exercise and take his medicaitons as presribed          Core Components/Risk Factors/Patient Goals at Discharge (Final Review):  Goals and Risk Factor Review - 04/27/17 1422      Core Components/Risk Factors/Patient Goals Review   Personal Goals Review  Weight Management/Obesity;Lipids;Hypertension    Review  Derrick [name] is doing well with exercise at cardiac rehab. Derrick [name] has had some intermittent exertional BP elevations.    Expected Outcomes  Derrick [name] will continue to come to exercise and take his medicaitons as presribed       ITP Comments: ITP Comments    Row Name 04/06/17 1610 04/27/17 1434 04/27/17 1447       ITP Comments  Dr. Armanda Magic, Medical Director  Derrick [name] has good partcipation and attendance in phase 2 cardiac rehab.  30 Day ITP review. Derrick [name] has good partcipation and attendance in phase 2 cardiac rehab.        Comments: See ITP comments Chrisotpher's exercise flow sheets from cardiac rehab were faxed to Dr Harvie Bridge office for  review.Derrick [name].Maria Whitaker, RN,BSN 04/28/2017 8:59 AM

## 2017-04-28 ENCOUNTER — Encounter (HOSPITAL_COMMUNITY)
Admission: RE | Admit: 2017-04-28 | Discharge: 2017-04-28 | Disposition: A | Discharge status: Home / Self Care | Payer: BLUE CROSS/BLUE SHIELD | Source: Ambulatory Visit | Attending: Cardiovascular Disease | Admitting: Cardiovascular Disease

## 2017-04-28 ENCOUNTER — Encounter (HOSPITAL_COMMUNITY): Payer: BLUE CROSS/BLUE SHIELD

## 2017-04-28 DIAGNOSIS — Z48812 Encounter for surgical aftercare following surgery on the circulatory system: Secondary | ICD-10-CM | POA: Diagnosis not present

## 2017-04-28 DIAGNOSIS — Z951 Presence of aortocoronary bypass graft: Secondary | ICD-10-CM

## 2017-05-01 ENCOUNTER — Encounter (HOSPITAL_COMMUNITY): Payer: BLUE CROSS/BLUE SHIELD

## 2017-05-01 ENCOUNTER — Encounter (HOSPITAL_COMMUNITY)
Admission: RE | Admit: 2017-05-01 | Discharge: 2017-05-01 | Disposition: A | Discharge status: Home / Self Care | Payer: BLUE CROSS/BLUE SHIELD | Source: Ambulatory Visit | Attending: Cardiovascular Disease | Admitting: Cardiovascular Disease

## 2017-05-01 DIAGNOSIS — Z951 Presence of aortocoronary bypass graft: Secondary | ICD-10-CM

## 2017-05-01 DIAGNOSIS — Z48812 Encounter for surgical aftercare following surgery on the circulatory system: Secondary | ICD-10-CM | POA: Diagnosis not present

## 2017-05-01 NOTE — Progress Notes (Signed)
Christoper AllegraSteven R Hayter 61 y.o. male DOB: 06/06/1955 MRN: [id number]      Nutrition  1. S/P CABG x 2   Note Spoke with pt. Nutrition Plan and Nutrition Survey goals reviewed with pt. Pt is following a Heart Healthy diet with some areas for improvement (e.g. Decreasing the amount of added sugar in the diet). Pt expressed understanding of the information reviewed. Pt aware of nutrition education classes offered.  Nutrition Diagnosis Food-and nutrition-related knowledge deficit related to lack of exposure to information as related to diagnosis of: ? CVD   Nutrition Intervention ? Pt's individual nutrition plan reviewed with pt. ? Benefits of adopting Heart Healthy diet discussed when Medficts reviewed.  Nutrition Goal(s):  ? Pt to identify and limit food sources of saturated fat, trans fat, and sodium ? Pt to identify food quantities necessary to achieve weight maintenance around 210 lb at graduation from cardiac rehab.   Plan:  Pt to attend nutrition classes ? Portion Distortion  Will provide client-centered nutrition education as part of interdisciplinary care.   Monitor and evaluate progress toward nutrition goal with team.  Mickle PlumbEdna Brieanna Nau, M.Ed, RD, LDN, CDE 05/01/2017 3:41 PM

## 2017-05-03 ENCOUNTER — Encounter (HOSPITAL_COMMUNITY)
Admission: RE | Admit: 2017-05-03 | Discharge: 2017-05-03 | Disposition: A | Discharge status: Home / Self Care | Payer: BLUE CROSS/BLUE SHIELD | Source: Ambulatory Visit | Attending: Cardiovascular Disease | Admitting: Cardiovascular Disease

## 2017-05-03 ENCOUNTER — Encounter (HOSPITAL_COMMUNITY): Payer: BLUE CROSS/BLUE SHIELD

## 2017-05-03 DIAGNOSIS — Z951 Presence of aortocoronary bypass graft: Secondary | ICD-10-CM

## 2017-05-03 DIAGNOSIS — Z48812 Encounter for surgical aftercare following surgery on the circulatory system: Secondary | ICD-10-CM | POA: Diagnosis not present

## 2017-05-05 ENCOUNTER — Encounter: Payer: Self-pay | Admitting: Cardiovascular Disease

## 2017-05-05 ENCOUNTER — Encounter (HOSPITAL_COMMUNITY)
Admission: RE | Admit: 2017-05-05 | Discharge: 2017-05-05 | Disposition: A | Discharge status: Home / Self Care | Payer: BLUE CROSS/BLUE SHIELD | Source: Ambulatory Visit | Attending: Cardiovascular Disease | Admitting: Cardiovascular Disease

## 2017-05-05 ENCOUNTER — Ambulatory Visit: Payer: BLUE CROSS/BLUE SHIELD | Admitting: Cardiovascular Disease

## 2017-05-05 ENCOUNTER — Encounter (HOSPITAL_COMMUNITY): Payer: BLUE CROSS/BLUE SHIELD

## 2017-05-05 VITALS — BP 118/50 | HR 48 | Ht 74.0 in | Wt 218.0 lb

## 2017-05-05 DIAGNOSIS — Z951 Presence of aortocoronary bypass graft: Secondary | ICD-10-CM

## 2017-05-05 DIAGNOSIS — E782 Mixed hyperlipidemia: Secondary | ICD-10-CM

## 2017-05-05 DIAGNOSIS — I2510 Atherosclerotic heart disease of native coronary artery without angina pectoris: Secondary | ICD-10-CM | POA: Diagnosis not present

## 2017-05-05 DIAGNOSIS — I480 Paroxysmal atrial fibrillation: Secondary | ICD-10-CM | POA: Diagnosis not present

## 2017-05-05 DIAGNOSIS — Z48812 Encounter for surgical aftercare following surgery on the circulatory system: Secondary | ICD-10-CM | POA: Diagnosis not present

## 2017-05-05 MED ORDER — AMIODARONE HCL 400 MG PO TABS: 400.0000 mg | ORAL_TABLET | ORAL | 3 refills | Status: DC

## 2017-05-05 MED ORDER — AMIODARONE HCL 400 MG PO TABS: 200.0000 mg | ORAL_TABLET | ORAL | 0 refills | Status: DC

## 2017-05-05 NOTE — Patient Instructions (Addendum)
Medication Instructions:  Your physician has recommended you make the following change in your medication: DECREASE Amiodarone to 400 mg every other day   Labwork: None Ordered   Testing/Procedures: None Ordered   Follow-Up: Your physician recommends that you schedule a follow-up appointment in: 3 months with Dr. Elease HashimotoNahser.    If you need a refill on your cardiac medications before your next appointment, please call your pharmacy.   Thank you for choosing [facility]! Eligha BridegroomMichelle Caylin Raby, RN [phone]

## 2017-05-05 NOTE — Addendum Note (Signed)
Addended by: Vesta MixerNAHSER, Omari Koslosky J on: 05/05/2017 12:14 PM   Modules accepted: Orders

## 2017-05-05 NOTE — Progress Notes (Signed)
Christoper AllegraSteven R Laroque Date of Birth  09/04/1955 [facility] [address]     [address] [place], [place]  [address] [phone]  Fax  [phone]  Problem List: 1. CAD, status post stents to the LAD,  left circumflex artery 2006 . His chronically occluded right coronary artery S/p CABG ( bilateral IMA )  2. hyperlipidemia-intolerant to statins 3. Hypertension 4. Peripheral Vascular disease   History of Present Illness:  61 yo male with history of CAD - s/p stents to LAD and LCx.  Chronically occluded RCA.  No angina.  He eats a very fatty diet - lots of fried foods.  Not exercising.  He does lots of yard work and has never had any episodes of angina.  Oct 01, 2013:  Viviann SpareSteven was seen by Lawson FiscalLori in Oct. 2014 for pre-op eval prior to rotator cuff surgery.    His myoview study showed:  Low risk stress nuclear study with a large, severe intensity, partially reversible inferior defect consistent with prior inferior infarct and very mild peri-infarct ischemia.  LV Ejection Fraction: 54%. LV Wall Motion: Inferior hypokinesis.  He has shoulder surgery without difficulty.  He's doing well. He denies any chest pain or shortness of breath.  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     Feb. 12 2018:    Has done well.  Has hx of PVD - Fabienne Bruns(Charles Fields)   Oct. 1 2018;    Brett CanalesSteve has been having some DOE , chest pain with exertion Multiple occasions over the past month,,   Gradually getting worse   Occurred while he was pruning and cleaning up his yard.  Is exercising more.  Works 11 hours a day . Works at [employer] .  Symptoms feel very similar to his previous symptoms in 2006  May 05, 2017:  Jeannett SeniorStephen is doing well.  He had a cath in October that showed significant left main disease as well as other narrowings.  He had coronary artery bypass grafting.  He is done well since that time.  He had some postoperative atrial fibrillation.  He  is on Cardizem 120 mg a day as well as metoprolol 25 mg twice a day.  He was not started on anticoagulation.  Current Outpatient Medications  Medication Sig Dispense Refill  . acetaminophen (TYLENOL) 650 MG CR tablet Take 650 mg by mouth 2 (two) times daily as needed for pain.     Marland Kitchen. amiodarone (PACERONE) 400 MG tablet Take 400 mg by mouth daily.     Marland Kitchen. aspirin EC 325 MG EC tablet Take 1 tablet (325 mg total) by mouth daily. 30 tablet 0  . calcitRIOL (ROCALTROL) 0.5 MCG capsule Take 0.5 mcg by mouth daily.    . calcium carbonate (OSCAL) 1500 (600 Ca) MG TABS tablet Take 600 mg of elemental calcium 2 (two) times daily with a meal by mouth. Patient takes 2 in the morning and 1 in the evening.    . Cyanocobalamin (VITAMIN B-12) 2500 MCG SUBL Place 2,500 mcg under the tongue daily.    Marland Kitchen. diltiazem (CARDIZEM CD) 120 MG 24 hr capsule Take 1 capsule (120 mg total) by mouth daily. 90 capsule 0  . ezetimibe (ZETIA) 10 MG tablet Take 10 mg by mouth daily.      . fenofibrate 160 MG tablet Take 160 mg by mouth daily.    . fluticasone (FLONASE) 50 MCG/ACT nasal spray Place 2 sprays into both  nostrils daily as needed for allergies or rhinitis.    Marland Kitchen gabapentin (NEURONTIN) 300 MG capsule Take 300 mg by mouth every evening.   6  . losartan (COZAAR) 100 MG tablet Take 100 mg by mouth every evening.     . metoprolol tartrate (LOPRESSOR) 25 MG tablet Take 1 tablet (25 mg total) by mouth 2 (two) times daily. 180 tablet 0  . nitroGLYCERIN (NITROSTAT) 0.4 MG SL tablet Place 1 tablet (0.4 mg total) under the tongue every 5 (five) minutes as needed for chest pain. 25 tablet 12  . Omega-3 Fatty Acids (OMEGA-3 EPA FISH OIL PO) Take 3,210 mg by mouth 2 (two) times daily.    . ranitidine (ZANTAC) 150 MG tablet Take 150 mg by mouth 2 (two) times daily.    . traZODone (DESYREL) 50 MG tablet Take 50 mg by mouth at bedtime.    . TURMERIC PO Take 500 mg by mouth daily.      No current facility-administered medications for this  visit.      Allergies  Allergen Reactions  . Atorvastatin     Muscle aches  . Codeine Itching  . Crestor [Rosuvastatin Calcium]     Muscle aches  . Meloxicam Other (See Comments)    Can not take due to kidneys  . Statins Other (See Comments)    myalgia    Past Medical History:  Diagnosis Date  . Coronary artery disease 2006   stents x2=LAD LCX; totally occluded RCA b. CABG 03/02/17 x2 (left internal mammary artery to LAD, Y-graft right mammary from left mammary to OM).  . Dyslipidemia   . GERD (gastroesophageal reflux disease)   . History of bronchitis   . Hyperlipidemia    statin intolerant  . Hyperparathyroidism   . Hypertension   . Insomnia   . Metatarsalgia   . OA (osteoarthritis)   . Peripheral vascular disease (HCC)   . Seasonal allergies   . Urinary frequency   . Wears glasses     Past Surgical History:  Procedure Laterality Date  . CARDIAC CATHETERIZATION  10/28/2004   circ and mid LAD chyper stents  . CERVICAL FUSION  [id number]  . CERVICAL SPINE SURGERY  2004  . COLONOSCOPY    . CORONARY ANGIOPLASTY  2006   stents placed   . CORONARY ARTERY BYPASS GRAFT N/A 03/02/2017   Procedure: CORONARY ARTERY BYPASS GRAFTING (CABG), ON PUMP, TIMES TWO, USING BILATERAL MAMMARY ARTERIES;  Surgeon: Loreli Slot, MD;  Location: [facility] OR;  Service: Open Heart Surgery;  Laterality: N/A;  . ELBOW ARTHROPLASTY  1994   right  . ENDARTERECTOMY FEMORAL Left 03/17/2015   Procedure: ENDARTERECTOMY COMMON FEMORAL WITH PROFUNDOPLASTY;  Surgeon: Sherren Kerns, MD;  Location: [facility] OR;  Service: Vascular;  Laterality: Left;  . FEMORAL-POPLITEAL BYPASS GRAFT Left 03/17/2015   Procedure: BYPASS GRAFT FEMORAL-POPLITEAL ARTERY-LEFT LEG AND POSTERIOR TIBIAL SEQUENTIAL GRAFT TO BELOW KNEE POPLITEAL ARTERY;  Surgeon: Sherren Kerns, MD;  Location: [facility] OR;  Service: Vascular;  Laterality: Left;  . FEMORAL-POPLITEAL BYPASS GRAFT Right 05/11/2015   Procedure:  RIGHT FEMORAL-BELOW THE KNEE  POPLITEAL ARTERY BYPASS GRAFT USING NON REVERSE RIGHT GREATER SAPHENOUS VEIN;  Surgeon: Sherren Kerns, MD;  [facility] OR;  Service: Vascular;  Laterality: Right;  . INCISION AND DRAINAGE ABSCESS POSTERIOR CERVICALSPINE  2009  . INTRAOPERATIVE ARTERIOGRAM Left 03/17/2015   Procedure: INTRA OPERATIVE ARTERIOGRAM X2;  Surgeon: Sherren Kerns, MD;  Location: [facility] OR;  Service: Vascular;  Laterality: Left;  .  INTRAOPERATIVE ARTERIOGRAM Right 05/11/2015   Procedure: INTRA OPERATIVE ARTERIOGRAM;  Surgeon: Sherren Kerns, MD;  Location: [facility] OR;  Service: Vascular;  Laterality: Right;  . LEFT HEART CATH AND CORONARY ANGIOGRAPHY N/A 03/01/2017   Procedure: LEFT HEART CATH AND CORONARY ANGIOGRAPHY;  Surgeon: Kathleene Hazel, MD;  Location: [facility] INVASIVE CV LAB;  Service: Cardiovascular;  Laterality: N/A;  . PATCH ANGIOPLASTY Left 03/17/2015   Procedure: VEIN PATCH ANGIOPLASTY COMMON FEMORAL ARTERY;  Surgeon: Sherren Kerns, MD;  Location: [facility] OR;  Service: Vascular;  Laterality: Left;  . PERIPHERAL VASCULAR CATHETERIZATION N/A 01/16/2015   Procedure: Abdominal Aortogram;  Surgeon: Sherren Kerns, MD;  Location: [facility] INVASIVE CV LAB;  Service: Cardiovascular;  Laterality: N/A;  . SHOULDER OPEN ROTATOR CUFF REPAIR Right 04/23/2013   Procedure: RIGHT TWO TENDON ROTATOR CUFF REPAIR  SUBACROMIAL DECOMPRESSION AND OPEN DISTAL CLAVICLE RESECTION;  Surgeon: Wyn Forster., MD;  Location: [facility];  Service: Orthopedics;  Laterality: Right;  . stents placed   2006  . TEE WITHOUT CARDIOVERSION N/A 03/02/2017   Procedure: TRANSESOPHAGEAL ECHOCARDIOGRAM (TEE);  Surgeon: Loreli Slot, MD;  Location: [facility] OR;  Service: Open Heart Surgery;  Laterality: N/A;  . VEIN HARVEST Right 05/11/2015   Procedure: WITH NON REVERSE RIGHT GREATER SAPHENOUS VEIN HARVEST;  Surgeon: Sherren Kerns, MD;  Location: [facility] OR;  Service: Vascular;  Laterality: Right;    Social History   Tobacco Use   Smoking Status Former Smoker  . Types: Cigarettes  . Last attempt to quit: 05/30/1989  . Years since quitting: 27.9  Smokeless Tobacco Never Used    Social History   Substance and Sexual Activity  Alcohol Use Yes  . Alcohol/week: 0.0 oz   Comment: one drink a month    Family History  Problem Relation Age of Onset  . Arthritis Mother   . Varicose Veins Mother   . Lung cancer Father   . COPD Father        Lung  . Cancer Brother        Lukemia  . COPD Brother   . Cancer Brother        Prostate    Reviw of Systems:  Reviewed in the HPI.  All other systems are negative.  Physical Exam: Blood pressure (!) 118/50, pulse (!) 48, height 6\' 2"  (1.88 m), weight 218 lb (98.9 kg), SpO2 93 %.  GEN:  Well nourished, well developed in no acute distress HEENT: Normal NECK: No JVD; No carotid bruits LYMPHATICS: No lymphadenopathy CARDIAC: RR, no murmurs, rubs, gallops RESPIRATORY:  Clear to auscultation without rales, wheezing or rhonchi  ABDOMEN: Soft, non-tender, non-distended MUSCULOSKELETAL:  No edema; No deformity  SKIN: Warm and dry NEUROLOGIC:  Alert and oriented x 3    ECG: Oct. 1, 2018:   Sinus brady at 57.  Inc. RBBB. NS ST abn.  Small Q wave in aVL with minimal ST elevatein - no significant changes from previous   Assessment / Plan:    1. CAD -    He is s/p CABG . Had post op Afib.  hes been on amio for 2 months  -he is maintaining normal sinus rhythm.  We will have him reduce his amiodarone to every other day at this point.  He is to discontinue it completely after 1 more month of therapy  He may be able to stop Cardizem at his next appt. I would like for him to continue metoprolol    2. PVD -  Stable      Kristeen MissPhilip Nusayba Cadenas, MD  05/05/2017 9:50 AM    [facility] [address],  [address] [place], [place]  [address] Pager [phone] Phone: [phone]; Fax: [phone]

## 2017-05-08 ENCOUNTER — Encounter (HOSPITAL_COMMUNITY): Payer: BLUE CROSS/BLUE SHIELD

## 2017-05-10 ENCOUNTER — Encounter (HOSPITAL_COMMUNITY)
Admission: RE | Admit: 2017-05-10 | Discharge: 2017-05-10 | Disposition: A | Discharge status: Home / Self Care | Payer: BLUE CROSS/BLUE SHIELD | Source: Ambulatory Visit | Attending: Cardiovascular Disease | Admitting: Cardiovascular Disease

## 2017-05-10 ENCOUNTER — Encounter (HOSPITAL_COMMUNITY): Payer: BLUE CROSS/BLUE SHIELD

## 2017-05-10 DIAGNOSIS — Z951 Presence of aortocoronary bypass graft: Secondary | ICD-10-CM

## 2017-05-10 DIAGNOSIS — Z48812 Encounter for surgical aftercare following surgery on the circulatory system: Secondary | ICD-10-CM | POA: Diagnosis not present

## 2017-05-12 ENCOUNTER — Encounter (HOSPITAL_COMMUNITY): Payer: BLUE CROSS/BLUE SHIELD

## 2017-05-12 ENCOUNTER — Encounter (HOSPITAL_COMMUNITY)
Admission: RE | Admit: 2017-05-12 | Discharge: 2017-05-12 | Disposition: A | Discharge status: Home / Self Care | Payer: BLUE CROSS/BLUE SHIELD | Source: Ambulatory Visit | Attending: Cardiovascular Disease | Admitting: Cardiovascular Disease

## 2017-05-12 DIAGNOSIS — Z48812 Encounter for surgical aftercare following surgery on the circulatory system: Secondary | ICD-10-CM | POA: Diagnosis not present

## 2017-05-12 DIAGNOSIS — Z951 Presence of aortocoronary bypass graft: Secondary | ICD-10-CM

## 2017-05-15 ENCOUNTER — Encounter (HOSPITAL_COMMUNITY): Payer: BLUE CROSS/BLUE SHIELD

## 2017-05-15 ENCOUNTER — Encounter (HOSPITAL_COMMUNITY)
Admission: RE | Admit: 2017-05-15 | Discharge: 2017-05-15 | Disposition: A | Discharge status: Home / Self Care | Payer: BLUE CROSS/BLUE SHIELD | Source: Ambulatory Visit | Attending: Cardiovascular Disease | Admitting: Cardiovascular Disease

## 2017-05-15 DIAGNOSIS — Z951 Presence of aortocoronary bypass graft: Secondary | ICD-10-CM

## 2017-05-15 DIAGNOSIS — Z48812 Encounter for surgical aftercare following surgery on the circulatory system: Secondary | ICD-10-CM | POA: Diagnosis not present

## 2017-05-17 ENCOUNTER — Encounter (HOSPITAL_COMMUNITY)
Admission: RE | Admit: 2017-05-17 | Discharge: 2017-05-17 | Disposition: A | Discharge status: Home / Self Care | Payer: BLUE CROSS/BLUE SHIELD | Source: Ambulatory Visit | Attending: Cardiovascular Disease | Admitting: Cardiovascular Disease

## 2017-05-17 ENCOUNTER — Encounter (HOSPITAL_COMMUNITY): Payer: BLUE CROSS/BLUE SHIELD

## 2017-05-17 DIAGNOSIS — Z951 Presence of aortocoronary bypass graft: Secondary | ICD-10-CM

## 2017-05-17 DIAGNOSIS — Z48812 Encounter for surgical aftercare following surgery on the circulatory system: Secondary | ICD-10-CM | POA: Diagnosis not present

## 2017-05-19 ENCOUNTER — Encounter (HOSPITAL_COMMUNITY): Payer: BLUE CROSS/BLUE SHIELD

## 2017-05-19 ENCOUNTER — Encounter (HOSPITAL_COMMUNITY)
Admission: RE | Admit: 2017-05-19 | Discharge: 2017-05-19 | Disposition: A | Discharge status: Home / Self Care | Payer: BLUE CROSS/BLUE SHIELD | Source: Ambulatory Visit | Attending: Cardiovascular Disease | Admitting: Cardiovascular Disease

## 2017-05-19 DIAGNOSIS — Z951 Presence of aortocoronary bypass graft: Secondary | ICD-10-CM

## 2017-05-19 DIAGNOSIS — Z48812 Encounter for surgical aftercare following surgery on the circulatory system: Secondary | ICD-10-CM | POA: Diagnosis not present

## 2017-05-24 ENCOUNTER — Encounter (HOSPITAL_COMMUNITY): Payer: BLUE CROSS/BLUE SHIELD

## 2017-05-24 ENCOUNTER — Encounter (HOSPITAL_COMMUNITY)
Admission: RE | Admit: 2017-05-24 | Discharge: 2017-05-24 | Disposition: A | Discharge status: Home / Self Care | Payer: BLUE CROSS/BLUE SHIELD | Source: Ambulatory Visit | Attending: Cardiovascular Disease | Admitting: Cardiovascular Disease

## 2017-05-24 DIAGNOSIS — Z951 Presence of aortocoronary bypass graft: Secondary | ICD-10-CM

## 2017-05-24 DIAGNOSIS — Z48812 Encounter for surgical aftercare following surgery on the circulatory system: Secondary | ICD-10-CM | POA: Diagnosis not present

## 2017-05-26 ENCOUNTER — Encounter (HOSPITAL_COMMUNITY): Payer: BLUE CROSS/BLUE SHIELD

## 2017-05-26 ENCOUNTER — Telehealth: Payer: Self-pay | Admitting: Cardiovascular Disease

## 2017-05-26 ENCOUNTER — Encounter (HOSPITAL_COMMUNITY)
Admission: RE | Admit: 2017-05-26 | Discharge: 2017-05-26 | Disposition: A | Discharge status: Home / Self Care | Payer: BLUE CROSS/BLUE SHIELD | Source: Ambulatory Visit | Attending: Cardiovascular Disease | Admitting: Cardiovascular Disease

## 2017-05-26 DIAGNOSIS — Z48812 Encounter for surgical aftercare following surgery on the circulatory system: Secondary | ICD-10-CM | POA: Diagnosis not present

## 2017-05-26 DIAGNOSIS — Z951 Presence of aortocoronary bypass graft: Secondary | ICD-10-CM

## 2017-05-26 NOTE — Telephone Encounter (Signed)
Called patient and advised that he will need to get release to return to work from [employer]. Patient states he could not remember which office he was supposed to call to get release. I advised that he saw Lowella DandyErin Barrett, PA on 11/5 but there is no documentation of release to return to work. He states she told him he did not need to follow-up with their office unless he had problems. I advised that when the patient was in the office on 12/7 that I recall him asking Dr. Elease HashimotoNahser about clearance and Dr. Elease HashimotoNahser advised him to get clearance from the surgeon to make certain the sternum is stable. Patient verbalized understanding and agreement and thanked me for the call.

## 2017-05-26 NOTE — Telephone Encounter (Signed)
New message    Patient calling to request note to return to work 1/7, work 8 hour shift , restrictions on weight that can be lifted.  Please call

## 2017-05-26 NOTE — Progress Notes (Signed)
Cardiac Individual Treatment Plan  Patient Details  Name: Derrick [name] MRN: [id number] Date of Birth: 04/08/1956 Referring Provider:     CARDIAC REHAB PHASE II OR[facility]ATION [facility]06[facility]2017 [facility]  Referring Provider  Nahser, Philip MD      Initial Encounter Date:    CARDIAC REHAB PHASE II ORIENTATION [facility]2017 [facility]  Date  04/06/17  Referring Provider  Nahser, Loistine ChancePhilip MD      Visit Diagnosis: S/P CABG x 2  Patient's Home Medications on Admission:  Current Outpatient Medications:  .  acetaminophen (TYLENOL) 650 MG CR tablet, Take 650 mg by mouth 2 (two) times daily as needed for pain. , Disp: , Rfl:  .  amiodarone (PACERONE) 400 MG tablet, Take 0.5 tablets (200 mg total) by mouth every other day., Disp: 15 tablet, Rfl: 0 .  aspirin EC 325 MG EC tablet, Take 1 tablet (325 mg total) by mouth daily., Disp: 30 tablet, Rfl: 0 .  calcitRIOL (ROCALTROL) 0.5 MCG capsule, Take 0.5 mcg by mouth daily., Disp: , Rfl:  .  calcium carbonate (OSCAL) 1500 (600 Ca) MG TABS tablet, Take 600 mg of elemental calcium 2 (two) times daily with a meal by mouth. Patient takes 2 in the morning and 1 in the evening., Disp: , Rfl:  .  Cyanocobalamin (VITAMIN B-12) 2500 MCG SUBL, Place 2,500 mcg under the tongue daily., Disp: , Rfl:  .  diltiazem (CARDIZEM CD) 120 MG 24 hr capsule, Take 1 capsule (120 mg total) by mouth daily., Disp: 90 capsule, Rfl: 0 .  ezetimibe (ZETIA) 10 MG tablet, Take 10 mg by mouth daily.  , Disp: , Rfl:  .  fenofibrate 160 MG tablet, Take 160 mg by mouth daily., Disp: , Rfl:  .  fluticasone (FLONASE) 50 MCG/ACT nasal spray, Place 2 sprays into both nostrils daily as needed for allergies or rhinitis., Disp: , Rfl:  .  gabapentin (NEURONTIN) 300 MG capsule, Take 300 mg by mouth every evening. , Disp: , Rfl: 6 .  losartan (COZAAR) 100 MG tablet, Take 100 mg by mouth every evening. , Disp: , Rfl:  .   metoprolol tartrate (LOPRESSOR) 25 MG tablet, Take 1 tablet (25 mg total) by mouth 2 (two) times daily., Disp: 180 tablet, Rfl: 0 .  nitroGLYCERIN (NITROSTAT) 0.4 MG SL tablet, Place 1 tablet (0.4 mg total) under the tongue every 5 (five) minutes as needed for chest pain., Disp: 25 tablet, Rfl: 12 .  Omega-3 Fatty Acids (OMEGA-3 EPA FISH OIL PO), Take 3,210 mg by mouth 2 (two) times daily., Disp: , Rfl:  .  ranitidine (ZANTAC) 150 MG tablet, Take 150 mg by mouth 2 (two) times daily., Disp: , Rfl:  .  traZODone (DESYREL) 50 MG tablet, Take 50 mg by mouth at bedtime., Disp: , Rfl:  .  TURMERIC PO, Take 500 mg by mouth daily. , Disp: , Rfl:   Past Medical History: Past Medical History:  Diagnosis Date  . Coronary artery disease 2006   stents x2=LAD LCX; totally occluded RCA b. CABG 03/02/17 x2 (left internal mammary artery to LAD, Y-graft right mammary from left mammary to OM).  . Dyslipidemia   . GERD (gastroesophageal reflux disease)   . History of bronchitis   . Hyperlipidemia    statin intolerant  . Hyperparathyroidism   . Hypertension   . Insomnia   . Metatarsalgia   . OA (osteoarthritis)   . Peripheral vascular disease (HCC)   .  Seasonal allergies   . Urinary frequency   . Wears glasses     Tobacco Use: Social History   Tobacco Use  Smoking Status Former Smoker  . Types: Cigarettes  . Last attempt to quit: 05/30/1989  . Years since quitting: 28.0  Smokeless Tobacco Never Used    Labs: Recent Hydrographic surveyor    Labs for ITP Cardiac and Pulmonary Rehab Latest Ref Rng & Units 03/02/2017 03/02/2017 03/03/2017 03/03/2017 03/03/2017   Hemoglobin A1c 4.8 - 5.6 % - - - - -   PHART 7.350 - 7.450 - 7.285(L) 7.298(L) - -   PCO2ART 32.0 - 48.0 mmHg - 49.5(H) 46.7 - -   HCO3 20.0 - 28.0 mmol/L - 23.4 22.7 - -   TCO2 22 - 32 mmol/L 24 25 24 24 25    ACIDBASEDEF 0.0 - 2.0 mmol/L - 3.0(H) 4.0(H) - -   O2SAT % - 99.0 98.0 - -      Capillary Blood Glucose: Lab Results  Component  Value Date   GLUCAP 121 (H) 03/07/2017   GLUCAP 125 (H) 03/06/2017   GLUCAP 107 (H) 03/06/2017   GLUCAP 114 (H) 03/06/2017   GLUCAP 133 (H) 03/05/2017     Exercise Target Goals:    Exercise Program Goal: Individual exercise prescription set with THRR, safety & activity barriers. Participant demonstrates ability to understand and report RPE using BORG scale, to self-measure pulse accurately, and to acknowledge the importance of the exercise prescription.  Exercise Prescription Goal: Starting with aerobic activity 30 plus minutes a day, 3 days per week for initial exercise prescription. Provide home exercise prescription and guidelines that participant acknowledges understanding prior to discharge.  Activity Barriers & Risk Stratification: Activity Barriers & Cardiac Risk Stratification - 04/06/17 0852      Activity Barriers & Cardiac Risk Stratification   Activity Barriers  Joint Problems;Deconditioning;Muscular Weakness;Arthritis;Neck/Spine Problems;Other (comment)    Comments  R RTC, R elbow surgery and PVD    Cardiac Risk Stratification  High       6 Minute Walk: 6 Minute Walk    Row Name 04/06/17 1203         6 Minute Walk   Phase  Initial     Distance  1265 feet     Walk Time  6 minutes     # of Rest Breaks  0     MPH  2.4     METS  3.05     RPE  13     VO2 Peak  10.66     Symptoms  No     Resting HR  56 bpm     Resting BP  118/60     Resting Oxygen Saturation   99 %     Exercise Oxygen Saturation  during 6 min walk  100 %     Max Ex. HR  70 bpm     Max Ex. BP  138/58     2 Minute Post BP  110/58        Oxygen Initial Assessment:   Oxygen Re-Evaluation:   Oxygen Discharge (Final Oxygen Re-Evaluation):   Initial Exercise Prescription: Initial Exercise Prescription - 04/06/17 1200      Date of Initial Exercise RX and Referring Provider   Date  04/06/17    Referring Provider  Nahser, Philip MD      Treadmill   MPH  2.4    Grade  0    Minutes   10    METs  2.84      Bike   Level  0.7    Minutes  10    METs  2.39      NuStep   Level  3    SPM  80    Minutes  10    METs  2      Prescription Details   Frequency (times per week)  3    Duration  Progress to 30 minutes of continuous aerobic without signs/symptoms of physical distress      Intensity   THRR 40-80% of Max Heartrate  63-125    Ratings of Perceived Exertion  11-13    Perceived Dyspnea  0-4      Progression   Progression  Continue to progress workloads to maintain intensity without signs/symptoms of physical distress.      Resistance Training   Training Prescription  Yes    Weight  2lbs    Reps  10-15       Perform Capillary Blood Glucose checks as needed.  Exercise Prescription Changes:  Exercise Prescription Changes    Row Name 04/12/17 1502 04/14/17 1600 04/26/17 1600 05/10/17 1600 05/12/17 1600     Response to Exercise   Blood Pressure (Admit)  128/62  158/80  158/76  142/80  140/70   Blood Pressure (Exercise)  144/78  176/70  146/68  138/72  142/70   Blood Pressure (Exit)  108/60  128/68  123/60  120/74  124/72   Heart Rate (Admit)  63 bpm  67 bpm  60 bpm  74 bpm  54 bpm   Heart Rate (Exercise)  87 bpm  98 bpm  90 bpm  112 bpm  93 bpm   Heart Rate (Exit)  67 bpm  66 bpm  53 bpm  73 bpm  67 bpm   Rating of Perceived Exertion (Exercise)  13  12  12  12  12    Symptoms  none  none  none  none  none   Duration  Progress to 30 minutes of  aerobic without signs/symptoms of physical distress  Progress to 30 minutes of  aerobic without signs/symptoms of physical distress  Progress to 30 minutes of  aerobic without signs/symptoms of physical distress  Continue with 30 min of aerobic exercise without signs/symptoms of physical distress.  Continue with 30 min of aerobic exercise without signs/symptoms of physical distress.   Intensity  THRR unchanged  THRR unchanged  THRR unchanged  -  THRR unchanged     Progression   Progression  Continue to progress  workloads to maintain intensity without signs/symptoms of physical distress.  Continue to progress workloads to maintain intensity without signs/symptoms of physical distress.  Continue to progress workloads to maintain intensity without signs/symptoms of physical distress.  -  Continue to progress workloads to maintain intensity without signs/symptoms of physical distress.   Average METs  2.5  2.7  3.3  4  4      Resistance Training   Training Prescription  Yes  Yes  No Relaxation today  Yes  Yes   Weight  2lbs  2lbs  -  3lbs  3lbs   Reps  10-15  10-15  -  10-15  10-15   Time  10 Minutes  10 Minutes  -  10 Minutes  10 Minutes     Interval Training   Interval Training  No  No  No  No  No     Treadmill   MPH  2.4  2.4  -  -  -  Grade  0  0  -  -  -   Minutes  10  10  -  -  -   METs  2.84  2.84  -  -  -     Bike   Level  0.7  -  1.5  1.5  1.5   Minutes  10  -  10  10  10    METs  2.39  -  3.9  3.88  3.88     NuStep   Level  3  4  5  5  5    SPM  80  80  85  85  85   Minutes  10  10  10  10  10    METs  2.4  2.8  3.6  4.1  4     Track   Laps  -  8 Track instead of Airdyne today because of high BP.  8  17  18    Minutes  -  10  10  10  10    METs  -  2.39  2.39  3.95  4.14     Home Exercise Plan   Plans to continue exercise at  -  Home (comment)  Home (comment)  Home (comment)  Home (comment)   Frequency  -  Add 4 additional days to program exercise sessions.  Add 4 additional days to program exercise sessions.  Add 4 additional days to program exercise sessions.  Add 4 additional days to program exercise sessions.   Initial Home Exercises Provided  -  04/14/17  04/14/17  04/14/17  04/14/17   Row Name 05/24/17 1600             Response to Exercise   Blood Pressure (Admit)  116/64       Blood Pressure (Exercise)  134/78       Blood Pressure (Exit)  122/62       Heart Rate (Admit)  77 bpm       Heart Rate (Exercise)  99 bpm       Heart Rate (Exit)  60 bpm       Rating of  Perceived Exertion (Exercise)  12       Symptoms  none       Duration  Continue with 30 min of aerobic exercise without signs/symptoms of physical distress.       Intensity  THRR unchanged         Progression   Progression  Continue to progress workloads to maintain intensity without signs/symptoms of physical distress.       Average METs  4.3         Resistance Training   Training Prescription  Yes       Weight  3lbs       Reps  10-15       Time  10 Minutes         Interval Training   Interval Training  No         Bike   Level  1.7       Minutes  10       METs  4.21         NuStep   Level  5       SPM  85       Minutes  10       METs  4.2         Track   Laps  20  Minutes  10       METs  4.48         Home Exercise Plan   Plans to continue exercise at  Home (comment)       Frequency  Add 4 additional days to program exercise sessions.       Initial Home Exercises Provided  04/14/17          Exercise Comments:  Exercise Comments    Row Name 04/14/17 1606 04/26/17 1504 05/12/17 1509 05/24/17 1525     Exercise Comments  Reviewed exercise prescription, METs and goals with patient.  Reviewed METs with patient.  Reviewed METs and goals with patient.  Reviewed METs with patient.       Exercise Goals and Review:  Exercise Goals    Row Name 04/06/17 0853             Exercise Goals   Increase Physical Activity  Yes       Intervention  Provide advice, education, support and counseling about physical activity/exercise needs.;Develop an individualized exercise prescription for aerobic and resistive training based on initial evaluation findings, risk stratification, comorbidities and participant's personal goals.       Expected Outcomes  Achievement of increased cardiorespiratory fitness and enhanced flexibility, muscular endurance and strength shown through measurements of functional capacity and personal statement of participant.       Increase Strength and  Stamina  Yes increase strength in legs and muscles throughout       Intervention  Provide advice, education, support and counseling about physical activity/exercise needs.;Develop an individualized exercise prescription for aerobic and resistive training based on initial evaluation findings, risk stratification, comorbidities and participant's personal goals.       Expected Outcomes  Achievement of increased cardiorespiratory fitness and enhanced flexibility, muscular endurance and strength shown through measurements of functional capacity and personal statement of participant.       Able to understand and use rate of perceived exertion (RPE) scale  Yes       Intervention  Provide education and explanation on how to use RPE scale       Expected Outcomes  Long Term:  Able to use RPE to guide intensity level when exercising independently;Short Term: Able to use RPE daily in rehab to express subjective intensity level       Knowledge and understanding of Target Heart Rate Range (THRR)  Yes       Intervention  Provide education and explanation of THRR including how the numbers were predicted and where they are located for reference       Expected Outcomes  Short Term: Able to state/look up THRR;Long Term: Able to use THRR to govern intensity when exercising independently;Short Term: Able to use daily as guideline for intensity in rehab       Able to check pulse independently  Yes       Intervention  Provide education and demonstration on how to check pulse in carotid and radial arteries.;Review the importance of being able to check your own pulse for safety during independent exercise       Expected Outcomes  Short Term: Able to explain why pulse checking is important during independent exercise;Long Term: Able to check pulse independently and accurately       Understanding of Exercise Prescription  Yes       Intervention  Provide education, explanation, and written materials on patient's individual  exercise prescription       Expected Outcomes  Short  Term: Able to explain program exercise prescription;Long Term: Able to explain home exercise prescription to exercise independently          Exercise Goals Re-Evaluation : Exercise Goals Re-Evaluation    Row Name 04/14/17 1603 05/12/17 1509           Exercise Goal Re-Evaluation   Exercise Goals Review  Increase Physical Activity;Understanding of Exercise Prescription;Knowledge and understanding of Target Heart Rate Range (THRR);Able to understand and use rate of perceived exertion (RPE) scale  Increase Physical Activity      Comments  Reviewed home exericse guidelines with patient including THRR, RPE scale and endpoints for exercise. Pt is walking 25 minutes daily. Encouraged pt to increase duration to 30 minutes, and pt is amenable to this.  Patient [place], weather [place]. Discussed exercise progression, and pt feels that he is progressing well in the program.      Expected Outcomes  Continue walking at home in addition to exercise at cardiac rehab to help achieve health and fitness benefits.  Increase workloads as tolerated to achieve health and fitness goals.          Discharge Exercise Prescription (Final Exercise Prescription Changes): Exercise Prescription Changes - 05/24/17 1600      Response to Exercise   Blood Pressure (Admit)  116/64    Blood Pressure (Exercise)  134/78    Blood Pressure (Exit)  122/62    Heart Rate (Admit)  77 bpm    Heart Rate (Exercise)  99 bpm    Heart Rate (Exit)  60 bpm    Rating of Perceived Exertion (Exercise)  12    Symptoms  none    Duration  Continue with 30 min of aerobic exercise without signs/symptoms of physical distress.    Intensity  THRR unchanged      Progression   Progression  Continue to progress workloads to maintain intensity without signs/symptoms of physical distress.    Average METs  4.3      Resistance Training   Training Prescription   Yes    Weight  3lbs    Reps  10-15    Time  10 Minutes      Interval Training   Interval Training  No      Bike   Level  1.7    Minutes  10    METs  4.21      NuStep   Level  5    SPM  85    Minutes  10    METs  4.2      Track   Laps  20    Minutes  10    METs  4.48      Home Exercise Plan   Plans to continue exercise at  Home (comment)    Frequency  Add 4 additional days to program exercise sessions.    Initial Home Exercises Provided  04/14/17       Nutrition:  Target Goals: Understanding of nutrition guidelines, daily intake of sodium 1500mg , cholesterol 200mg , calories 30% from fat and 7% or less from saturated fats, daily to have 5 or more servings of fruits and vegetables.  Biometrics: Pre Biometrics - 04/06/17 1207      Pre Biometrics   Waist Circumference  40 inches    Hip Circumference  40.25 inches    Waist to Hip Ratio  0.99 %    Triceps Skinfold  17 mm    % Body Fat  27.4 %  Grip Strength  37 kg    Flexibility  7.5 in    Single Leg Stand  15.23 seconds        Nutrition Therapy Plan and Nutrition Goals: Nutrition Therapy & Goals - 04/06/17 1241      Nutrition Therapy   Diet  Therapeutic Lifestyle Changes      Personal Nutrition Goals   Nutrition Goal  Maintain wt of ~ 210 lb while in rehab.     Personal Goal #2  Pt to identify and limit food sources of saturated fat, trans fat, and sodium.       Intervention Plan   Intervention  Prescribe, educate and counsel regarding individualized specific dietary modifications aiming towards targeted core components such as weight, hypertension, lipid management, diabetes, heart failure and other comorbidities.    Expected Outcomes  Short Term Goal: Understand basic principles of dietary content, such as calories, fat, sodium, cholesterol and nutrients.;Long Term Goal: Adherence to prescribed nutrition plan.       Nutrition Discharge: Nutrition Scores: Nutrition Assessments - 04/06/17 1242       MEDFICTS Scores   Pre Score  50       Nutrition Goals Re-Evaluation:   Nutrition Goals Re-Evaluation:   Nutrition Goals Discharge (Final Nutrition Goals Re-Evaluation):   Psychosocial: Target Goals: Acknowledge presence or absence of significant depression and/or stress, maximize coping skills, provide positive support system. Participant is able to verbalize types and ability to use techniques and skills needed for reducing stress and depression.  Initial Review & Psychosocial Screening: Initial Psych Review & Screening - 04/06/17 1322      Initial Review   Current issues with  None Identified      Family Dynamics   Good Support System?  Yes      Barriers   Psychosocial barriers to participate in program  There are no identifiable barriers or psychosocial needs.      Screening Interventions   Interventions  Encouraged to exercise       Quality of Life Scores: Quality of Life - 04/06/17 1212      Quality of Life Scores   Health/Function Pre  26.23 %    Socioeconomic Pre  26.86 %    Psych/Spiritual Pre  27.64 %    Family Pre  28.13 %    GLOBAL Pre  26.89 %       PHQ-9: Recent Review Flowsheet Data    [facility]   Decreased Interest 0   Down, Depressed, Hopeless 0   PHQ - 2 Score 0     Interpretation of Total Score  Total Score Depression Severity:  1-4 = Minimal depression, 5-9 = Mild depression, 10-14 = Moderate depression, 15-19 = Moderately severe depression, 20-27 = Severe depression   Psychosocial Evaluation and Intervention:   Psychosocial Re-Evaluation: Psychosocial Re-Evaluation    Row Name 04/27/17 1421 05/26/17 0912           Psychosocial Re-Evaluation   Current issues with  None Identified  None Identified      Interventions  Encouraged to attend Cardiac Rehabilitation for the exercise  Encouraged to attend Cardiac Rehabilitation for the exercise      Continue Psychosocial Services   No Follow up required  No  Follow up required         Psychosocial Discharge (Final Psychosocial Re-Evaluation): Psychosocial Re-Evaluation - 05/26/17 0912      Psychosocial Re-Evaluation   Current issues with  None Identified    Interventions  Encouraged to attend Cardiac Rehabilitation for the exercise    Continue Psychosocial Services   No Follow up required       Vocational Rehabilitation: Provide vocational rehab assistance to qualifying candidates.   Vocational Rehab Evaluation & Intervention: Vocational Rehab - 04/06/17 1322      Initial Vocational Rehab Evaluation & Intervention   Assessment shows need for Vocational Rehabilitation  No       Education: Education Goals: Education classes will be provided on a weekly basis, covering required topics. Participant will state understanding/return demonstration of topics presented.  Learning Barriers/Preferences: Learning Barriers/Preferences - 04/06/17 0850      Learning Barriers/Preferences   Learning Barriers  Sight    Learning Preferences  Individual Instruction;Skilled Demonstration;Written Material       Education Topics: Count Your Pulse:  -Group instruction provided by verbal instruction, demonstration, patient participation and written materials to support subject.  Instructors address importance of being able to find your pulse and how to count your pulse when at home without a heart monitor.  Patients get hands on experience counting their pulse with staff help and individually.   Heart Attack, Angina, and Risk Factor Modification:  -Group instruction provided by verbal instruction, video, and written materials to support subject.  Instructors address signs and symptoms of angina and heart attacks.    Also discuss risk factors for heart disease and how to make changes to improve heart health risk factors.   Functional Fitness:  -Group instruction provided by verbal instruction, demonstration, patient participation, and written  materials to support subject.  Instructors address safety measures for doing things around the house.  Discuss how to get up and down off the floor, how to pick things up properly, how to safely get out of a chair without assistance, and balance training.   CARDIAC REHAB PHASE II EXERCISE from 05/24/2017 in [facility] CARDIAC REHAB  Date  04/14/17  Instruction Review Code  2- meets goals/outcomes      Meditation and Mindfulness:  -Group instruction provided by verbal instruction, patient participation, and written materials to support subject.  Instructor addresses importance of mindfulness and meditation practice to help reduce stress and improve awareness.  Instructor also leads participants through a meditation exercise.    Stretching for Flexibility and Mobility:  -Group instruction provided by verbal instruction, patient participation, and written materials to support subject.  Instructors lead participants through series of stretches that are designed to increase flexibility thus improving mobility.  These stretches are additional exercise for major muscle groups that are typically performed during regular warm up and cool down.   CARDIAC REHAB PHASE II EXERCISE from 05/24/2017 in [facility] CARDIAC REHAB  Date  04/28/17  Instruction Review Code  2- meets goals/outcomes      Hands Only CPR:  -Group verbal, video, and participation provides a basic overview of AHA guidelines for community CPR. Role-play of emergencies allow participants the opportunity to practice calling for help and chest compression technique with discussion of AED use.   Hypertension: -Group verbal and written instruction that provides a basic overview of hypertension including the most recent diagnostic guidelines, risk factor reduction with self-care instructions and medication management.   CARDIAC REHAB PHASE II EXERCISE from 05/24/2017 in [facility] CARDIAC  REHAB  Date  05/05/17  Instruction Review Code  2- meets goals/outcomes       Nutrition I class: Heart Healthy Eating:  -Group instruction provided by PowerPoint slides, verbal discussion,  and written materials to support subject matter. The instructor gives an explanation and review of the Therapeutic Lifestyle Changes diet recommendations, which includes a discussion on lipid goals, dietary fat, sodium, fiber, plant stanol/sterol esters, sugar, and the components of a well-balanced, healthy diet.   Nutrition II class: Lifestyle Skills:  -Group instruction provided by PowerPoint slides, verbal discussion, and written materials to support subject matter. The instructor gives an explanation and review of label reading, grocery shopping for heart health, heart healthy recipe modifications, and ways to make healthier choices when eating out.   Diabetes Question & Answer:  -Group instruction provided by PowerPoint slides, verbal discussion, and written materials to support subject matter. The instructor gives an explanation and review of diabetes co-morbidities, pre- and post-prandial blood glucose goals, pre-exercise blood glucose goals, signs, symptoms, and treatment of hypoglycemia and hyperglycemia, and foot care basics.   Diabetes Blitz:  -Group instruction provided by PowerPoint slides, verbal discussion, and written materials to support subject matter. The instructor gives an explanation and review of the physiology behind type 1 and type 2 diabetes, diabetes medications and rational behind using different medications, pre- and post-prandial blood glucose recommendations and Hemoglobin A1c goals, diabetes diet, and exercise including blood glucose guidelines for exercising safely.    Portion Distortion:  -Group instruction provided by PowerPoint slides, verbal discussion, written materials, and food models to support subject matter. The instructor gives an explanation of serving size  versus portion size, changes in portions sizes over the last 20 years, and what consists of a serving from each food group.   CARDIAC REHAB PHASE II EXERCISE from 05/24/2017 in [facility] CARDIAC REHAB  Date  05/17/17  Educator  RD  Instruction Review Code  2- meets goals/outcomes      Stress Management:  -Group instruction provided by verbal instruction, video, and written materials to support subject matter.  Instructors review role of stress in heart disease and how to cope with stress positively.     CARDIAC REHAB PHASE II EXERCISE from 05/24/2017 in [facility] CARDIAC REHAB  Date  05/24/17  Instruction Review Code  2- meets goals/outcomes      Exercising on Your Own:  -Group instruction provided by verbal instruction, power point, and written materials to support subject.  Instructors discuss benefits of exercise, components of exercise, frequency and intensity of exercise, and end points for exercise.  Also discuss use of nitroglycerin and activating EMS.  Review options of places to exercise outside of rehab.  Review guidelines for sex with heart disease.   Cardiac Drugs I:  -Group instruction provided by verbal instruction and written materials to support subject.  Instructor reviews cardiac drug classes: antiplatelets, anticoagulants, beta blockers, and statins.  Instructor discusses reasons, side effects, and lifestyle considerations for each drug class.   CARDIAC REHAB PHASE II EXERCISE from 05/24/2017 in [facility] CARDIAC REHAB  Date  04/12/17  Instruction Review Code  2- meets goals/outcomes      Cardiac Drugs II:  -Group instruction provided by verbal instruction and written materials to support subject.  Instructor reviews cardiac drug classes: angiotensin converting enzyme inhibitors (ACE-I), angiotensin II receptor blockers (ARBs), nitrates, and calcium channel blockers.  Instructor discusses reasons, side effects,  and lifestyle considerations for each drug class.   CARDIAC REHAB PHASE II EXERCISE from 05/24/2017 in [facility] CARDIAC REHAB  Date  05/10/17  Instruction Review Code  2- meets goals/outcomes      Anatomy  and Physiology of the Circulatory System:  Group verbal and written instruction and models provide basic cardiac anatomy and physiology, with the coronary electrical and arterial systems. Review of: AMI, Angina, Valve disease, Heart Failure, Peripheral Artery Disease, Cardiac Arrhythmia, Pacemakers, and the ICD.   CARDIAC REHAB PHASE II EXERCISE from 05/24/2017 in [facility] CARDIAC REHAB  Date  04/26/17  Instruction Review Code  2- meets goals/outcomes      Other Education:  -Group or individual verbal, written, or video instructions that support the educational goals of the cardiac rehab program.   Knowledge Questionnaire Score: Knowledge Questionnaire Score - 04/06/17 1159      Knowledge Questionnaire Score   Pre Score  15/24       Core Components/Risk Factors/Patient Goals at Admission: Personal Goals and Risk Factors at Admission - 04/06/17 1216      Core Components/Risk Factors/Patient Goals on Admission    Weight Management  Yes;Weight Maintenance;Weight Loss    Intervention  Weight Management: Develop a combined nutrition and exercise program designed to reach desired caloric intake, while maintaining appropriate intake of nutrient and fiber, sodium and fats, and appropriate energy expenditure required for the weight goal.;Weight Management: Provide education and appropriate resources to help participant work on and attain dietary goals.;Weight Management/Obesity: Establish reasonable short term and long term weight goals.    Admit Weight  209 lb 14.1 oz (95.2 kg)    Goal Weight: Short Term  205 lb (93 kg)    Goal Weight: Long Term  200 lb (90.7 kg)    Expected Outcomes  Short Term: Continue to assess and modify interventions until  short term weight is achieved;Long Term: Adherence to nutrition and physical activity/exercise program aimed toward attainment of established weight goal;Weight Maintenance: Understanding of the daily nutrition guidelines, which includes 25-35% calories from fat, 7% or less cal from saturated fats, less than 200mg  cholesterol, less than 1.5gm of sodium, & 5 or more servings of fruits and vegetables daily;Weight Loss: Understanding of general recommendations for a balanced deficit meal plan, which promotes 1-2 lb weight loss per week and includes a negative energy balance of [phone] kcal/d;Understanding recommendations for meals to include 15-35% energy as protein, 25-35% energy from fat, 35-60% energy from carbohydrates, less than 200mg  of dietary cholesterol, 20-35 gm of total fiber daily;Understanding of distribution of calorie intake throughout the day with the consumption of 4-5 meals/snacks       Core Components/Risk Factors/Patient Goals Review:  Goals and Risk Factor Review    Row Name 04/27/17 1422 05/26/17 0912           Core Components/Risk Factors/Patient Goals Review   Personal Goals Review  Weight Management/Obesity;Lipids;Hypertension  Weight Management/Obesity;Lipids;Hypertension      Review  Derrick [name] is doing well with exercise at cardiac rehab. Derrick [name] has had some intermittent exertional BP elevations.  Derrick [name] is doing well with exercise at cardiac rehab. Derrick [name] has had some intermittent exertional BP elevations.      Expected Outcomes  Derrick [name] will continue to come to exercise and take his medicaitons as presribed  Derrick [name] will continue to come to exercise and take his medicaitons as presribed         Core Components/Risk Factors/Patient Goals at Discharge (Final Review):  Goals and Risk Factor Review - 05/26/17 0912      Core Components/Risk Factors/Patient Goals Review   Personal Goals Review  Weight Management/Obesity;Lipids;Hypertension    Review  Derrick [name] is doing well with exercise  at cardiac rehab. Derrick [name]  has had some intermittent exertional BP elevations.    Expected Outcomes  Derrick [name] will continue to come to exercise and take his medicaitons as presribed       ITP Comments: ITP Comments    Row Name 04/06/17 6962 04/27/17 1434 04/27/17 1447 05/26/17 0915     ITP Comments  Dr. Armanda Magic, Medical Director  Derrick [name] has good partcipation and attendance in phase 2 cardiac rehab.  30 Day ITP review. Derrick [name] has good partcipation and attendance in phase 2 cardiac rehab.  30 Day ITP review. Derrick [name] has good partcipation and attendance in phase 2 cardiac rehab.       Comments: See ITP comments

## 2017-05-29 ENCOUNTER — Encounter (HOSPITAL_COMMUNITY): Payer: BLUE CROSS/BLUE SHIELD

## 2017-05-29 ENCOUNTER — Encounter (HOSPITAL_COMMUNITY)
Admission: RE | Admit: 2017-05-29 | Discharge: 2017-05-29 | Disposition: A | Discharge status: Home / Self Care | Payer: BLUE CROSS/BLUE SHIELD | Source: Ambulatory Visit | Attending: Cardiovascular Disease | Admitting: Cardiovascular Disease

## 2017-05-29 DIAGNOSIS — Z48812 Encounter for surgical aftercare following surgery on the circulatory system: Secondary | ICD-10-CM | POA: Diagnosis not present

## 2017-05-29 DIAGNOSIS — Z951 Presence of aortocoronary bypass graft: Secondary | ICD-10-CM

## 2017-05-31 ENCOUNTER — Encounter (HOSPITAL_COMMUNITY): Payer: BLUE CROSS/BLUE SHIELD

## 2017-05-31 ENCOUNTER — Encounter (HOSPITAL_COMMUNITY)
Admission: RE | Admit: 2017-05-31 | Discharge: 2017-05-31 | Disposition: A | Discharge status: Home / Self Care | Payer: BLUE CROSS/BLUE SHIELD | Source: Ambulatory Visit | Attending: Cardiovascular Disease | Admitting: Cardiovascular Disease

## 2017-05-31 DIAGNOSIS — Z951 Presence of aortocoronary bypass graft: Secondary | ICD-10-CM | POA: Diagnosis present

## 2017-05-31 DIAGNOSIS — Z48812 Encounter for surgical aftercare following surgery on the circulatory system: Secondary | ICD-10-CM | POA: Insufficient documentation

## 2017-06-02 ENCOUNTER — Encounter (HOSPITAL_COMMUNITY): Payer: BLUE CROSS/BLUE SHIELD

## 2017-06-02 ENCOUNTER — Encounter (HOSPITAL_COMMUNITY)
Admission: RE | Admit: 2017-06-02 | Discharge: 2017-06-02 | Disposition: A | Discharge status: Home / Self Care | Payer: BLUE CROSS/BLUE SHIELD | Source: Ambulatory Visit | Attending: Cardiovascular Disease | Admitting: Cardiovascular Disease

## 2017-06-02 DIAGNOSIS — Z951 Presence of aortocoronary bypass graft: Secondary | ICD-10-CM

## 2017-06-02 DIAGNOSIS — Z48812 Encounter for surgical aftercare following surgery on the circulatory system: Secondary | ICD-10-CM | POA: Diagnosis not present

## 2017-06-05 ENCOUNTER — Encounter (HOSPITAL_COMMUNITY): Payer: BLUE CROSS/BLUE SHIELD

## 2017-06-05 ENCOUNTER — Encounter (HOSPITAL_COMMUNITY)
Admission: RE | Admit: 2017-06-05 | Discharge: 2017-06-05 | Disposition: A | Discharge status: Home / Self Care | Payer: BLUE CROSS/BLUE SHIELD | Source: Ambulatory Visit | Attending: Cardiovascular Disease | Admitting: Cardiovascular Disease

## 2017-06-05 VITALS — BP 138/78 | HR 75 | Ht 73.0 in | Wt 217.2 lb

## 2017-06-05 DIAGNOSIS — Z951 Presence of aortocoronary bypass graft: Secondary | ICD-10-CM

## 2017-06-05 DIAGNOSIS — Z48812 Encounter for surgical aftercare following surgery on the circulatory system: Secondary | ICD-10-CM | POA: Diagnosis not present

## 2017-06-05 NOTE — Progress Notes (Signed)
Discharge Progress Report  Patient Details  Name: Derrick [name] MRN: [id number] Date of Birth: [birth year] Referring Provider:     [place] from 04/06/2017 in [facility]  Referring Provider  Nahser, Arnette Norris MD       Number of Visits: 23  Reason for Discharge:  Early Exit:  Back to work  Smoking History:  Social History   Tobacco Use  Smoking Status Former Smoker  . Types: Cigarettes  . Last attempt to quit: 05/30/1989  . Years since quitting: 28.0  Smokeless Tobacco Never Used    Diagnosis:  S/P CABG x 2  ADL UCSD:   Initial Exercise Prescription: Initial Exercise Prescription - 04/06/17 1200      Date of Initial Exercise RX and Referring Provider   Date  04/06/17    Referring Provider  Nahser, Philip MD      Treadmill   MPH  2.4    Grade  0    Minutes  10    METs  2.84      Bike   Level  0.7    Minutes  10    METs  2.39      NuStep   Level  3    SPM  80    Minutes  10    METs  2      Prescription Details   Frequency (times per week)  3    Duration  Progress to 30 minutes of continuous aerobic without signs/symptoms of physical distress      Intensity   THRR 40-80% of Max Heartrate  63-125    Ratings of Perceived Exertion  11-13    Perceived Dyspnea  0-4      Progression   Progression  Continue to progress workloads to maintain intensity without signs/symptoms of physical distress.      Resistance Training   Training Prescription  Yes    Weight  2lbs    Reps  10-15       Discharge Exercise Prescription (Final Exercise Prescription Changes): Exercise Prescription Changes - 06/05/17 1510      Response to Exercise   Blood Pressure (Admit)  138/78    Blood Pressure (Exercise)  160/70    Blood Pressure (Exit)  142/70    Heart Rate (Admit)  75 bpm    Heart Rate (Exercise)  85 bpm    Heart Rate (Exit)  61 bpm    Rating of Perceived Exertion (Exercise)  11    Symptoms  none     Duration  Continue with 30 min of aerobic exercise without signs/symptoms of physical distress.    Intensity  THRR unchanged      Progression   Progression  Continue to progress workloads to maintain intensity without signs/symptoms of physical distress.    Average METs  4.1      Resistance Training   Training Prescription  Yes    Weight  3lbs    Reps  10-15    Time  10 Minutes      Interval Training   Interval Training  No      Bike   Level  1.7    Minutes  10    METs  4.22      NuStep   Level  6    SPM  85    Minutes  10    METs  4.6      Track   Laps  15  Minutes  10    METs  3.61      Home Exercise Plan   Plans to continue exercise at  Home (comment)    Frequency  Add 4 additional days to program exercise sessions.    Initial Home Exercises Provided  04/14/17       Functional Capacity: 6 Minute Walk    Row Name 04/06/17 1203 06/05/17 1517       6 Minute Walk   Phase  Initial  Discharge    Distance  1265 feet  1277 feet    Distance % Change  -  0.95 %    Walk Time  6 minutes  6 minutes    # of Rest Breaks  0  0    MPH  2.4  2.42    METS  3.05  3.29    RPE  13  11    VO2 Peak  10.66  11.53    Symptoms  No  No    Resting HR  56 bpm  75 bpm    Resting BP  118/60  138/78    Resting Oxygen Saturation   99 %  -    Exercise Oxygen Saturation  during 6 min walk  100 %  -    Max Ex. HR  70 bpm  85 bpm    Max Ex. BP  138/58  160/70    2 Minute Post BP  110/58  142/70       Psychological, QOL, Others - Outcomes: PHQ 2/9: Depression screen [facility] 2/9 06/26/2017 04/10/2017  Decreased Interest 0 0  Down, Depressed, Hopeless 0 0  PHQ - 2 Score 0 0    Quality of Life: Quality of Life - 06/13/17 1208      Quality of Life Scores   Health/Function Pre  26.23 %    Health/Function Post  28.3 %    Health/Function % Change  7.89 %    Socioeconomic Pre  26.86 %    Socioeconomic Post  30 %    Socioeconomic % Change   11.69 %    Psych/Spiritual Pre  27.64 %     Psych/Spiritual Post  30 %    Psych/Spiritual % Change  8.54 %    Family Pre  28.13 %    Family Post  30 %    Family % Change  6.65 %    GLOBAL Pre  26.89 %    GLOBAL Post  29.25 %    GLOBAL % Change  8.78 %       Personal Goals: Goals established at orientation with interventions provided to work toward goal. Personal Goals and Risk Factors at Admission - 04/06/17 1216      Core Components/Risk Factors/Patient Goals on Admission    Weight Management  Yes;Weight Maintenance;Weight Loss    Intervention  Weight Management: Develop a combined nutrition and exercise program designed to reach desired caloric intake, while maintaining appropriate intake of nutrient and fiber, sodium and fats, and appropriate energy expenditure required for the weight goal.;Weight Management: Provide education and appropriate resources to help participant work on and attain dietary goals.;Weight Management/Obesity: Establish reasonable short term and long term weight goals.    Admit Weight  209 lb 14.1 oz (95.2 kg)    Goal Weight: Short Term  205 lb (93 kg)    Goal Weight: Long Term  200 lb (90.7 kg)    Expected Outcomes  Short Term: Continue to assess and modify interventions until short  term weight is achieved;Long Term: Adherence to nutrition and physical activity/exercise program aimed toward attainment of established weight goal;Weight Maintenance: Understanding of the daily nutrition guidelines, which includes 25-35% calories from fat, 7% or less cal from saturated fats, less than 257m cholesterol, less than 1.5gm of sodium, & 5 or more servings of fruits and vegetables daily;Weight Loss: Understanding of general recommendations for a balanced deficit meal plan, which promotes 1-2 lb weight loss per week and includes a negative energy balance of [phone] kcal/d;Understanding recommendations for meals to include 15-35% energy as protein, 25-35% energy from fat, 35-60% energy from carbohydrates, less than 2047m of dietary cholesterol, 20-35 gm of total fiber daily;Understanding of distribution of calorie intake throughout the day with the consumption of 4-5 meals/snacks        Personal Goals Discharge: Goals and Risk Factor Review    Row Name 04/27/17 1422 05/26/17 0912           Core Components/Risk Factors/Patient Goals Review   Personal Goals Review  Weight Management/Obesity;Lipids;Hypertension  Weight Management/Obesity;Lipids;Hypertension      Review  StRichardson Landrys doing well with exercise at cardiac rehab. StRichardson Landryas had some intermittent exertional BP elevations.  StRichardson Landrys doing well with exercise at cardiac rehab. StRichardson Landryas had some intermittent exertional BP elevations.      Expected Outcomes  StRichardson Landryill continue to come to exercise and take his medicaitons as presribed  StRichardson Landryill continue to come to exercise and take his medicaitons as presribed         Exercise Goals and Review: Exercise Goals    Row Name 04/06/17 0853             Exercise Goals   Increase Physical Activity  Yes       Intervention  Provide advice, education, support and counseling about physical activity/exercise needs.;Develop an individualized exercise prescription for aerobic and resistive training based on initial evaluation findings, risk stratification, comorbidities and participant's personal goals.       Expected Outcomes  Achievement of increased cardiorespiratory fitness and enhanced flexibility, muscular endurance and strength shown through measurements of functional capacity and personal statement of participant.       Increase Strength and Stamina  Yes increase strength in legs and muscles throughout       Intervention  Provide advice, education, support and counseling about physical activity/exercise needs.;Develop an individualized exercise prescription for aerobic and resistive training based on initial evaluation findings, risk stratification, comorbidities and participant's personal goals.        Expected Outcomes  Achievement of increased cardiorespiratory fitness and enhanced flexibility, muscular endurance and strength shown through measurements of functional capacity and personal statement of participant.       Able to understand and use rate of perceived exertion (RPE) scale  Yes       Intervention  Provide education and explanation on how to use RPE scale       Expected Outcomes  Long Term:  Able to use RPE to guide intensity level when exercising independently;Short Term: Able to use RPE daily in rehab to express subjective intensity level       Knowledge and understanding of Target Heart Rate Range (THRR)  Yes       Intervention  Provide education and explanation of THRR including how the numbers were predicted and where they are located for reference       Expected Outcomes  Short Term: Able to state/look up THRR;Long Term: Able to use THRR to  govern intensity when exercising independently;Short Term: Able to use daily as guideline for intensity in rehab       Able to check pulse independently  Yes       Intervention  Provide education and demonstration on how to check pulse in carotid and radial arteries.;Review the importance of being able to check your own pulse for safety during independent exercise       Expected Outcomes  Short Term: Able to explain why pulse checking is important during independent exercise;Long Term: Able to check pulse independently and accurately       Understanding of Exercise Prescription  Yes       Intervention  Provide education, explanation, and written materials on patient's individual exercise prescription       Expected Outcomes  Short Term: Able to explain program exercise prescription;Long Term: Able to explain home exercise prescription to exercise independently          Nutrition & Weight - Outcomes: Pre Biometrics - 04/06/17 1207      Pre Biometrics   Waist Circumference  40 inches    Hip Circumference  40.25 inches    Waist to Hip Ratio   0.99 %    Triceps Skinfold  17 mm    % Body Fat  27.4 %    Grip Strength  37 kg    Flexibility  7.5 in    Single Leg Stand  15.23 seconds      Post Biometrics - 06/05/17 1510       Post  Biometrics   Height  6' 1"  (1.854 m)    Weight  217 lb 2.5 oz (98.5 kg)    Waist Circumference  41 inches    Hip Circumference  39 inches    Waist to Hip Ratio  1.05 %    BMI (Calculated)  28.66    Triceps Skinfold  19 mm    % Body Fat  28.7 %    Grip Strength  39 kg    Flexibility  10.5 in    Single Leg Stand  30 seconds       Nutrition: Nutrition Therapy & Goals - 04/06/17 1241      Nutrition Therapy   Diet  Therapeutic Lifestyle Changes      Personal Nutrition Goals   Nutrition Goal  Maintain wt of ~ 210 lb while in rehab.     Personal Goal #2  Pt to identify and limit food sources of saturated fat, trans fat, and sodium.       Intervention Plan   Intervention  Prescribe, educate and counsel regarding individualized specific dietary modifications aiming towards targeted core components such as weight, hypertension, lipid management, diabetes, heart failure and other comorbidities.    Expected Outcomes  Short Term Goal: Understand basic principles of dietary content, such as calories, fat, sodium, cholesterol and nutrients.;Long Term Goal: Adherence to prescribed nutrition plan.       Nutrition Discharge: Nutrition Assessments - 06/23/17 1227      MEDFICTS Scores   Pre Score  50    Post Score  -- pt returned incomplete survey       Education Questionnaire Score: Knowledge Questionnaire Score - 06/13/17 1208      Knowledge Questionnaire Score   Pre Score  15/24    Post Score  18/24       Goals reviewed with patient; copy given to patient. [name]  graduated from cardiac rehab program today with completion of 23 exercise sessions  in Phase II. Pt maintained good attendance and progressed nicely during his participation in rehab as evidenced by increased MET level. [name]  finished the program early so that he can return to work.  Medication list reconciled. Repeat  PHQ score-0  .  Pt has made significant lifestyle changes and should be commended for his success. Pt feels he has achieved his goals during cardiac rehab.   Pt plans [place] continue [place] weather permitting.Barnet Pall, RN,BSN 06/26/2017 9:32 AM

## 2017-06-07 ENCOUNTER — Encounter (HOSPITAL_COMMUNITY): Payer: BLUE CROSS/BLUE SHIELD

## 2017-06-09 ENCOUNTER — Encounter (HOSPITAL_COMMUNITY): Payer: BLUE CROSS/BLUE SHIELD

## 2017-06-12 ENCOUNTER — Encounter (HOSPITAL_COMMUNITY): Payer: BLUE CROSS/BLUE SHIELD

## 2017-06-14 ENCOUNTER — Encounter (HOSPITAL_COMMUNITY): Payer: BLUE CROSS/BLUE SHIELD

## 2017-06-16 ENCOUNTER — Encounter (HOSPITAL_COMMUNITY): Payer: BLUE CROSS/BLUE SHIELD

## 2017-06-19 ENCOUNTER — Encounter (HOSPITAL_COMMUNITY): Payer: BLUE CROSS/BLUE SHIELD

## 2017-06-21 ENCOUNTER — Encounter (HOSPITAL_COMMUNITY): Payer: BLUE CROSS/BLUE SHIELD

## 2017-06-23 ENCOUNTER — Encounter (HOSPITAL_COMMUNITY): Payer: BLUE CROSS/BLUE SHIELD

## 2017-06-26 ENCOUNTER — Encounter (HOSPITAL_COMMUNITY): Payer: BLUE CROSS/BLUE SHIELD

## 2017-06-26 NOTE — Addendum Note (Signed)
Encounter addended by: Cammy CopaWhitaker, Maria W, RN on: 06/26/2017 9:22 AM  Actions taken: Visit Navigator Flowsheet section accepted

## 2017-06-28 ENCOUNTER — Encounter (HOSPITAL_COMMUNITY): Payer: BLUE CROSS/BLUE SHIELD

## 2017-06-29 ENCOUNTER — Telehealth: Payer: Self-pay | Admitting: Cardiovascular Disease

## 2017-06-29 NOTE — Telephone Encounter (Signed)
1. What dental office are you calling from? [facility] (Dr. Cyndee BrightlyBlue)   2. What is your office phone and fax number? Office [phone] Fax # [phone]  3. What type of procedure is the patient having performed? Crown installed   4. What date is procedure scheduled or is the patient there now? End of February        5 .  What is your question (ex. Antibiotics prior to procedure, holding medication-we need to know how long dentist wants pt to hold med)? Patient just had open-heart  surgery so Dr. Cyndee BrightlyBlue wanted to make sure that she could use anesthetics.5

## 2017-06-30 ENCOUNTER — Encounter (HOSPITAL_COMMUNITY): Payer: BLUE CROSS/BLUE SHIELD

## 2017-07-03 NOTE — Telephone Encounter (Signed)
   Primary Cardiologist:Philip Nahser, MD  Chart reviewed as part of pre-operative protocol coverage. Pt with CABG 02/2017 - will forward question below to MD. Per intake " Patient just had open-heart  surgery so Dr. Cyndee BrightlyBlue wanted to make sure that she could use anesthetics." Dr. Elease HashimotoNahser - Please route response to P CV DIV PREOP (the pre-op pool). Thank you.  Laurann Montanaayna N Hennie Gosa, PA-C  07/03/2017, 9:56 AM

## 2017-07-03 NOTE — Telephone Encounter (Signed)
Pt is at low risk for dental procedure and local anesthetics

## 2017-07-04 NOTE — Telephone Encounter (Signed)
Chart reviewed by Dr. Elease HashimotoNahser. Patient low risk for dental procedure and local anesthetics. I have faxed a letter to the dentist, Dr. Cyndee BrightlyBlue.    Please contact Dr. Beaulah DinningBlue's office to ensure they received.   Tereso NewcomerScott Rayquan Amrhein, PA-C    07/04/2017 2:08 PM

## 2017-07-04 NOTE — Telephone Encounter (Signed)
I confirmed with Dr. Beaulah DinningBlue's office surgery clearance letter has been received.

## 2017-07-24 ENCOUNTER — Other Ambulatory Visit: Payer: Self-pay | Admitting: Cardiovascular Disease

## 2017-07-25 ENCOUNTER — Other Ambulatory Visit: Payer: Self-pay | Admitting: Nurse Practitioner

## 2017-07-25 DIAGNOSIS — I2510 Atherosclerotic heart disease of native coronary artery without angina pectoris: Secondary | ICD-10-CM

## 2017-07-25 DIAGNOSIS — E782 Mixed hyperlipidemia: Secondary | ICD-10-CM

## 2017-07-31 ENCOUNTER — Other Ambulatory Visit: Payer: Self-pay | Admitting: Cardiovascular Disease

## 2017-08-02 ENCOUNTER — Other Ambulatory Visit: Payer: BLUE CROSS/BLUE SHIELD | Admitting: *Deleted

## 2017-08-02 DIAGNOSIS — E782 Mixed hyperlipidemia: Secondary | ICD-10-CM

## 2017-08-02 DIAGNOSIS — I2510 Atherosclerotic heart disease of native coronary artery without angina pectoris: Secondary | ICD-10-CM

## 2017-08-02 LAB — BASIC METABOLIC PANEL
BUN/Creatinine Ratio: 13 (ref 10–24)
BUN: 20 mg/dL (ref 8–27)
CO2: 25 mmol/L (ref 20–29)
Calcium: 9.4 mg/dL (ref 8.6–10.2)
Chloride: 99 mmol/L (ref 96–106)
Creatinine, Ser: 1.59 mg/dL — ABNORMAL HIGH (ref 0.76–1.27)
GFR calc Af Amer: 52 mL/min/{1.73_m2} — ABNORMAL LOW (ref >59)
GFR calc non Af Amer: 45 mL/min/{1.73_m2} — ABNORMAL LOW (ref >59)
Glucose: 94 mg/dL (ref 65–99)
Potassium: 4.5 mmol/L (ref 3.5–5.2)
Sodium: 139 mmol/L (ref 134–144)

## 2017-08-02 LAB — LIPID PANEL
Chol/HDL Ratio: 4.6 ratio (ref 0.0–5.0)
Cholesterol, Total: 231 mg/dL — ABNORMAL HIGH (ref 100–199)
HDL: 50 mg/dL (ref >39)
LDL Calculated: 145 mg/dL — ABNORMAL HIGH (ref 0–99)
Triglycerides: 178 mg/dL — ABNORMAL HIGH (ref 0–149)
VLDL Cholesterol Cal: 36 mg/dL (ref 5–40)

## 2017-08-02 LAB — HEPATIC FUNCTION PANEL
ALT: 14 IU/L (ref 0–44)
AST: 19 IU/L (ref 0–40)
Albumin: 4.3 g/dL (ref 3.6–4.8)
Alkaline Phosphatase: 29 IU/L — ABNORMAL LOW (ref 39–117)
Bilirubin Total: 0.2 mg/dL (ref 0.0–1.2)
Bilirubin, Direct: 0.08 mg/dL (ref 0.00–0.40)
Total Protein: 7 g/dL (ref 6.0–8.5)

## 2017-08-09 ENCOUNTER — Ambulatory Visit: Payer: Self-pay | Admitting: Cardiovascular Disease

## 2017-08-10 ENCOUNTER — Telehealth: Payer: Self-pay | Admitting: Nurse Practitioner

## 2017-08-10 NOTE — Telephone Encounter (Signed)
Lab results reviewed with patient. His appointment was moved from 3/13 to 5/28 due to a scheduling change by our office. I moved his appointment to 4/4 to discuss options for lipid management and for amiodarone therapy. He states he would likely not want to have to self-administer an injectable PCSK9 and is not certain about participating in a clinical trial. He also mentions that there was another medication change Dr. Elease HashimotoNahser talked about making at this appointment. I advised that we will see if he is in NSR and if so, will d/c his amiodarone per Dr. Harvie BridgeNahser's notes. Patient verbalized understanding and agreement with plan and thanked me for the call.

## 2017-08-10 NOTE — Telephone Encounter (Signed)
-----   Message from Vesta MixerPhilip J Nahser, MD sent at 08/02/2017  6:00 PM EST ----- He has an appt to see me next week He is intolerant to statins. Will refer to lipid clinic We will also get an eCG .   If he has maintained NSR, we will DC amiodarone.

## 2017-08-28 ENCOUNTER — Other Ambulatory Visit: Payer: Self-pay | Admitting: Cardiovascular Disease

## 2017-08-31 ENCOUNTER — Ambulatory Visit: Payer: BLUE CROSS/BLUE SHIELD | Admitting: Cardiovascular Disease

## 2017-08-31 ENCOUNTER — Encounter: Payer: Self-pay | Admitting: Cardiovascular Disease

## 2017-08-31 VITALS — BP 138/60 | HR 55 | Ht 74.0 in | Wt 226.0 lb

## 2017-08-31 DIAGNOSIS — I480 Paroxysmal atrial fibrillation: Secondary | ICD-10-CM | POA: Diagnosis not present

## 2017-08-31 DIAGNOSIS — I2510 Atherosclerotic heart disease of native coronary artery without angina pectoris: Secondary | ICD-10-CM

## 2017-08-31 MED ORDER — ASPIRIN EC 81 MG PO TBEC: 81.0000 mg | DELAYED_RELEASE_TABLET | Freq: Every day | ORAL | Status: DC

## 2017-08-31 NOTE — Patient Instructions (Addendum)
Medication Instructions:  Your physician has recommended you make the following change in your medication:   STOP Amiodarone DECREASE Aspirin to 81 mg once daily  Labwork: None Ordered   Testing/Procedures: None Ordered   Follow-Up: You have been referred to Lipid Clinic for management of elevated LDL  Your physician wants you to follow-up in: 6 months with Dr. Elease HashimotoNahser. You will receive a reminder letter in the mail two months in advance. If you don't receive a letter, please call our office to schedule the follow-up appointment.   If you need a refill on your cardiac medications before your next appointment, please call your pharmacy.   Thank you for choosing [facility]! Eligha BridegroomMichelle Dick Hark, RN [phone]

## 2017-08-31 NOTE — Progress Notes (Signed)
Derrick [name] Date of Birth  01-15-1956 [facility] [address]     [address] [place], [place]  [address] [phone]  Fax  [phone]  Problem List: 1. CAD, status post stents to the LAD,  left circumflex artery 2006 . His chronically occluded right coronary artery S/p CABG ( bilateral IMA )  2. hyperlipidemia-intolerant to statins 3. Hypertension 4. Peripheral Vascular disease    62 yo male with history of CAD - s/p stents to LAD and LCx.  Chronically occluded RCA.  No angina.  He eats a very fatty diet - lots of fried foods.  Not exercising.  He does lots of yard work and has never had any episodes of angina.  Oct 01, 2013:  Derrick [name] was seen by Derrick [name] in Oct. 2014 for pre-op eval prior to rotator cuff surgery.    His myoview study showed:  Low risk stress nuclear study with a large, severe intensity, partially reversible inferior defect consistent with prior inferior infarct and very mild peri-infarct ischemia.  LV Ejection Fraction: 54%. LV Wall Motion: Inferior hypokinesis.  He has shoulder surgery without difficulty.  He's doing well. He denies any chest pain or shortness of breath.  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     Feb. 12 2018:    Has done well.  Has hx of PVD - Derrick [name])   Oct. 1 2018;    Derrick [name] has been having some DOE , chest pain with exertion Multiple occasions over the past month,,   Gradually getting worse   Occurred while he was pruning and cleaning up his yard.  Is exercising more.  Works 11 hours a day . 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  Symptoms feel very similar to his previous symptoms in 2006  May 05, 2017:  Derrick [name] is doing well.  He had a cath in October that showed significant left main disease as well as other narrowings.  He had coronary artery bypass grafting.  He is done well since that time.  He had some postoperative atrial fibrillation.  He is on Cardizem 120 mg a day  as well as metoprolol 25 mg twice a day.  He was not started on anticoagulation.  August 31, 2017: Derrick [name] is seen today for follow-up visit.  He has a history of coronary artery bypass grafting with postoperative atrial fibrillation.  History of hypertension and hyperlipidemia.  Has gained some weight.  Walks some  No CP ,   Lipids are elevatee.  intolerent to statins   Current Outpatient Medications  Medication Sig Dispense Refill  . acetaminophen (TYLENOL) 650 MG CR tablet Take 650 mg by mouth 2 (two) times daily as needed for pain.     Marland Kitchen amiodarone (PACERONE) 400 MG tablet Take 0.5 tablets (200 mg total) by mouth every other day. 15 tablet 0  . aspirin EC 325 MG EC tablet Take 1 tablet (325 mg total) by mouth daily. 30 tablet 0  . calcitRIOL (ROCALTROL) 0.5 MCG capsule Take 0.5 mcg by mouth daily.    . calcium carbonate (OSCAL) 1500 (600 Ca) MG TABS tablet Take 600 mg of elemental calcium 2 (two) times daily with a meal by mouth. Patient takes 2 in the morning and 1 in the evening.    . Cyanocobalamin (VITAMIN B-12) 2500 MCG SUBL Place 2,500 mcg under the tongue daily.    Marland Kitchen diltiazem (CARDIZEM CD) 120 MG 24 hr capsule TAKE 1  CAPSULE BY MOUTH EVERY DAY 90 capsule 1  . ezetimibe (ZETIA) 10 MG tablet Take 10 mg by mouth daily.      . fenofibrate 160 MG tablet Take 160 mg by mouth daily.    . fluticasone (FLONASE) 50 MCG/ACT nasal spray Place 2 sprays into both nostrils daily as needed for allergies or rhinitis.    Marland Kitchen gabapentin (NEURONTIN) 300 MG capsule Take 300 mg by mouth every evening.   6  . losartan (COZAAR) 100 MG tablet Take 100 mg by mouth every evening.     . metoprolol tartrate (LOPRESSOR) 25 MG tablet Take 1 tablet (25 mg total) by mouth 2 (two) times daily. 60 tablet 0  . nitroGLYCERIN (NITROSTAT) 0.4 MG SL tablet Place 1 tablet (0.4 mg total) under the tongue every 5 (five) minutes as needed for chest pain. 25 tablet 12  . Omega-3 Fatty Acids (OMEGA-3 EPA FISH OIL PO) Take 3,210  mg by mouth 2 (two) times daily.    . ranitidine (ZANTAC) 150 MG tablet Take 150 mg by mouth 2 (two) times daily.    . traZODone (DESYREL) 50 MG tablet Take 50 mg by mouth at bedtime.    . TURMERIC PO Take 500 mg by mouth daily.      No current facility-administered medications for this visit.      Allergies  Allergen Reactions  . Atorvastatin     Muscle aches  . Codeine Itching  . Crestor [Rosuvastatin Calcium]     Muscle aches  . Meloxicam Other (See Comments)    Can not take due to kidneys  . Statins Other (See Comments)    myalgia    Past Medical History:  Diagnosis Date  . Coronary artery disease 2006   stents x2=LAD LCX; totally occluded RCA b. CABG 03/02/17 x2 (left internal mammary artery to LAD, Y-graft right mammary from left mammary to OM).  . Dyslipidemia   . GERD (gastroesophageal reflux disease)   . History of bronchitis   . Hyperlipidemia    statin intolerant  . Hyperparathyroidism   . Hypertension   . Insomnia   . Metatarsalgia   . OA (osteoarthritis)   . Peripheral vascular disease (HCC)   . Seasonal allergies   . Urinary frequency   . Wears glasses     Past Surgical History:  Procedure Laterality Date  . CARDIAC CATHETERIZATION  10/28/2004   circ and mid LAD chyper stents  . CERVICAL FUSION  [id number]  . CERVICAL SPINE SURGERY  2004  . COLONOSCOPY    . CORONARY ANGIOPLASTY  2006   stents placed   . CORONARY ARTERY BYPASS GRAFT N/A 03/02/2017   Procedure: CORONARY ARTERY BYPASS GRAFTING (CABG), ON PUMP, TIMES TWO, USING BILATERAL MAMMARY ARTERIES;  Surgeon: Loreli Slot, MD;  Location: [facility] OR;  Service: Open Heart Surgery;  Laterality: N/A;  . ELBOW ARTHROPLASTY  1994   right  . ENDARTERECTOMY FEMORAL Left 03/17/2015   Procedure: ENDARTERECTOMY COMMON FEMORAL WITH PROFUNDOPLASTY;  Surgeon: Sherren Kerns, MD;  [facility];  Service: Vascular;  Laterality: Left;  . FEMORAL-POPLITEAL BYPASS GRAFT Left 03/17/2015   Procedure: BYPASS  GRAFT FEMORAL-POPLITEAL ARTERY-LEFT LEG AND POSTERIOR TIBIAL SEQUENTIAL GRAFT TO BELOW KNEE POPLITEAL ARTERY;  Surgeon: Sherren Kerns, MD;  Location[facility] OR;  Service: Vascular;  Laterality: Left;  . FEMORAL-POPLITEAL BYPASS GRAFT Right 05/11/2015   Procedure:  RIGHT FEMORAL-BELOW THE KNEE POPLITEAL ARTERY BYPASS GRAFT USING NON REVERSE RIGHT GREATER SAPHENOUS VEIN;  Surgeon: Sherren Kerns,  MD;  Location: [facility] OR;  Service: Vascular;  Laterality: Right;  . INCISION AND DRAINAGE ABSCESS POSTERIOR CERVICALSPINE  2009  . INTRAOPERATIVE ARTERIOGRAM Left 03/17/2015   Procedure: INTRA OPERATIVE ARTERIOGRAM X2;  Surgeon: Sherren Kernsharles E Fields, MD;  [facility];  Service: Vascular;  Laterality: Left;  . INTRAOPERATIVE ARTERIOGRAM Right 05/11/2015   Procedure: INTRA OPERATIVE ARTERIOGRAM;  Surgeon: Sherren Kernsharles E Fields, MD;  [facility];  Service: Vascular;  Laterality: Right;  . LEFT HEART CATH AND CORONARY ANGIOGRAPHY N/A 03/01/2017   Procedure: LEFT HEART CATH AND CORONARY ANGIOGRAPHY;  Surgeon: Kathleene HazelMcAlhany, Christopher D, MD;  Location: [facility] INVASIVE CV LAB;  Service: Cardiovascular;  Laterality: N/A;  . PATCH ANGIOPLASTY Left 03/17/2015   Procedure: VEIN PATCH ANGIOPLASTY COMMON FEMORAL ARTERY;  Surgeon: Sherren Kernsharles E Fields, MD;  [facility];  Service: Vascular;  Laterality: Left;  . PERIPHERAL VASCULAR CATHETERIZATION N/A 01/16/2015   Procedure: Abdominal Aortogram;  Surgeon: Sherren Kernsharles E Fields, MD;  [facility];  Service: Cardiovascular;  Laterality: N/A;  . SHOULDER OPEN ROTATOR CUFF REPAIR Right 04/23/2013   Procedure: RIGHT TWO TENDON ROTATOR CUFF REPAIR  SUBACROMIAL DECOMPRESSION AND OPEN DISTAL CLAVICLE RESECTION;  Surgeon: Wyn Forsterobert V Sypher Jr., MD;  Location: [facility];  Service: Orthopedics;  Laterality: Right;  . stents placed   2006  . TEE WITHOUT CARDIOVERSION N/A 03/02/2017   Procedure: TRANSESOPHAGEAL ECHOCARDIOGRAM (TEE);  Surgeon: Loreli SlotHendrickson, Cullin C, MD;   [facility];  Service: Open Heart Surgery;  Laterality: N/A;  . VEIN HARVEST Right 05/11/2015   Procedure: WITH NON REVERSE RIGHT GREATER SAPHENOUS VEIN HARVEST;  Surgeon: Sherren Kernsharles E Fields, MD;  [facility];  Service: Vascular;  Laterality: Right;    Social History   Tobacco Use  Smoking Status Former Smoker  . Types: Cigarettes  . Last attempt to quit: 05/30/1989  . Years since quitting: 28.2  Smokeless Tobacco Never Used    Social History   Substance and Sexual Activity  Alcohol Use Yes  . Alcohol/week: 0.0 oz   Comment: one drink a month    Family History  Problem Relation Age of Onset  . Arthritis Mother   . Varicose Veins Mother   . Lung cancer Father   . COPD Father        Lung  . Cancer Brother        Lukemia  . COPD Brother   . Cancer Brother        Prostate    Reviw of Systems:  Reviewed in the HPI.  All other systems are negative.  Physical Exam: Blood pressure 138/60, pulse (!) 55, height 6\' 2"  (1.88 m), weight 226 lb (102.5 kg), SpO2 97 %.  GEN:  Well nourished, well developed in no acute distress HEENT: Normal NECK: No JVD; No carotid bruits LYMPHATICS: No lymphadenopathy CARDIAC: RR, no murmurs, rubs, gallops RESPIRATORY:  Clear to auscultation without rales, wheezing or rhonchi  ABDOMEN: Soft, non-tender, non-distended MUSCULOSKELETAL:  No edema; No deformity  SKIN: Warm and dry NEUROLOGIC:  Alert and oriented x 3    ECG: August 31, 2017: Sinus bradycardia at 55 beats a minute.  No ST or T wave changes.  Changes from previous.  Assessment / Plan:    1. CAD -    He is s/p CABG . Had post op Afib.  Seems to be diong well   2.  Hy his LDL is 145.   Goal LDL is 70.  He does not tolerate statins.  We will will refer him to lipid clinic for consideration of PC SK 9 inhibitor versus the Clear trial.   3.   Atrial fib:   Had PAF associated with his coronary artery bypass grafting.  He has maintained normal sinus rhythm.  We will  discontinue the amiodarone completely at this point. As young as he is, I would choose an alternative antiarrhythmic if he has any further episodes of atrial fib     Kristeen Miss, MD  08/31/2017 3:52 PM    [facility] [address] [place],  [address] [place], [place]  [address] Pager [phone] Phone: [phone]; Fax: [phone]

## 2017-09-28 ENCOUNTER — Encounter: Payer: Self-pay | Admitting: Pharmacist

## 2017-09-28 ENCOUNTER — Ambulatory Visit (INDEPENDENT_AMBULATORY_CARE_PROVIDER_SITE_OTHER): Payer: BLUE CROSS/BLUE SHIELD | Admitting: Pharmacist

## 2017-09-28 DIAGNOSIS — E782 Mixed hyperlipidemia: Secondary | ICD-10-CM

## 2017-09-28 NOTE — Progress Notes (Signed)
Patient ID: Derrick [name]                 DOB: 06-Sep-1955                    MRN: [id number]     HPI: Derrick [name] is a 62 y.o. male patient of Dr. Elease Hashimoto that presents today for lipid evaluation.  PMH includes CAD - s/p stents to LAD and LCx.  Chronically occluded RCA.  No angina. He has had a history of having muscle symptoms on statin medications.   He reports that he aches and has terrible muscle aches. These symptoms improved when the medications were stopped. He reports today that he is not really interested in doing injections. He is much more interested in oral options.   Risk Factors: CAD s/p stenting LDL Goal: <70  Current Medications: ezetimibe  daily, fenofibrate  daily, fish oil  BID Intolerances: atorvastatin, rosuvastatin  daily (muscle aches, very tight muscles and hurting, constant pain and when stopped symptoms improve)  Diet: He eats out some. He eats hamburger, porkchops, ham, rare steak, chicken, and fish. He does eat vegetables regularly. Most meals are baked for fried. He fries foods in oil. He drinks water and rare alcohol.   Exercise: He walks every day at work. Tries to get 10,000 steps if possible.   Family History: mother - arthritis, varicose veins, Father - lung cancer, brother - lung cancer, COPD, prostate cancer  Social History: current smoker, denies alcohol  Labs: 08/02/17: TC 231, TG 178, HDL 50, LDL 145 (zetia  daily, fenofibrate  daily, fish oil  BID)  Past Medical History:  Diagnosis Date  . Coronary artery disease 2006   stents x2=LAD LCX; totally occluded RCA b. CABG 03/02/17 x2 (left internal mammary artery to LAD, Y-graft right mammary from left mammary to OM).  . Dyslipidemia   . GERD (gastroesophageal reflux disease)   . History of bronchitis   . Hyperlipidemia    statin intolerant  . Hyperparathyroidism   . Hypertension   . Insomnia   . Metatarsalgia   . OA (osteoarthritis)   . Peripheral vascular  disease (HCC)   . Seasonal allergies   . Urinary frequency   . Wears glasses     Current Outpatient Medications on File Prior to Visit  Medication Sig Dispense Refill  . acetaminophen (TYLENOL) 650 MG CR tablet Take 650 mg by mouth 2 (two) times daily as needed for pain.     Marland Kitchen aspirin EC 81 MG tablet Take 1 tablet (81 mg total) by mouth daily.    . calcitRIOL (ROCALTROL) 0.5 MCG capsule Take 0.5 mcg by mouth daily.    . calcium carbonate (OSCAL) 1500 (600 Ca) MG TABS tablet Take 600 mg of elemental calcium 2 (two) times daily with a meal by mouth. Patient takes 2 in the morning and 1 in the evening.    . Cyanocobalamin (VITAMIN B-12) 2500 MCG SUBL Place 2,500 mcg under the tongue daily.    Marland Kitchen diltiazem (CARDIZEM CD) 120 MG 24 hr capsule TAKE 1 CAPSULE BY MOUTH EVERY DAY 90 capsule 1  . ezetimibe (ZETIA) 10 MG tablet Take 10 mg by mouth daily.      . fenofibrate 160 MG tablet Take 160 mg by mouth daily.    . fluticasone (FLONASE) 50 MCG/ACT nasal spray Place 2 sprays into both nostrils daily as needed for allergies or rhinitis.    Marland Kitchen gabapentin (NEURONTIN) 300 MG capsule Take  300 mg by mouth every evening.   6  . losartan (COZAAR) 100 MG tablet Take 100 mg by mouth every evening.     . metoprolol tartrate (LOPRESSOR) 25 MG tablet Take 1 tablet (25 mg total) by mouth 2 (two) times daily. 60 tablet 0  . nitroGLYCERIN (NITROSTAT) 0.4 MG SL tablet Place 1 tablet (0.4 mg total) under the tongue every 5 (five) minutes as needed for chest pain. 25 tablet 12  . Omega-3 Fatty Acids (OMEGA-3 EPA FISH OIL PO) Take 3,210 mg by mouth 2 (two) times daily.    . ranitidine (ZANTAC) 150 MG tablet Take 150 mg by mouth 2 (two) times daily.    . traZODone (DESYREL) 50 MG tablet Take 50 mg by mouth at bedtime.    . TURMERIC PO Take 500 mg by mouth daily.      No current facility-administered medications on file prior to visit.     Allergies  Allergen Reactions  . Atorvastatin     Muscle aches  . Codeine  Itching  . Crestor [Rosuvastatin Calcium]     Muscle aches  . Meloxicam Other (See Comments)    Can not take due to kidneys  . Statins Other (See Comments)    myalgia    Assessment/Plan: Hyperlipidemia: LDL is not goal on current therapy. He is not interested in retrial of statin medication. He also would prefer not to do injection and is concerned with cost moving forward. Will refer to research division for consideration of CLEAR trial.   Thank you,  Freddie Apley. Cleatis Polka, PharmD  [facility]  09/28/2017 10:00 AM

## 2017-09-28 NOTE — Patient Instructions (Addendum)
We will send your information over to our research division. They will reach out to you in a few days to schedule you to come in if you meet criteria for the trial.  If you have any questions or concerns please call [phone]   Cholesterol Cholesterol is a fat. Your body needs a small amount of cholesterol. Cholesterol (plaque) may build up in your blood vessels (arteries). That makes you more likely to have a heart attack or stroke. You cannot feel your cholesterol level. Having a blood test is the only way to find out if your level is high. Keep your test results. Work with your doctor to keep your cholesterol at a good level. What do the results mean?  Total cholesterol is how much cholesterol is in your blood.  LDL is bad cholesterol. This is the type that can build up. Try to have low LDL.  HDL is good cholesterol. It cleans your blood vessels and carries LDL away. Try to have high HDL.  Triglycerides are fat that the body can store or burn for energy. What are good levels of cholesterol?  Total cholesterol below 200.  LDL below 100 is good for people who have health risks. LDL below 70 is good for people who have very high risks.  HDL above 40 is good. It is best to have HDL of 60 or higher.  Triglycerides below 150. How can I lower my cholesterol? Diet Follow your diet program as told by your doctor.  Choose fish, white meat chicken, or [place] that is roasted or baked. Try not to eat red meat, fried foods, sausage, or lunch meats.  Eat lots of fresh fruits and vegetables.  Choose whole grains, beans, pasta, potatoes, and cereals.  Choose olive oil, corn oil, or canola oil. Only use small amounts.  Try not to eat butter, mayonnaise, shortening, or palm kernel oils.  Try not to eat foods with trans fats.  Choose low-fat or nonfat dairy foods. ? Drink skim or nonfat milk. ? Eat low-fat or nonfat yogurt and cheeses. ? Try not to drink whole milk or cream. ? Try not  to eat ice cream, egg yolks, or full-fat cheeses.  Healthy desserts include angel food cake, ginger snaps, animal crackers, hard candy, popsicles, and low-fat or nonfat frozen yogurt. Try not to eat pastries, cakes, pies, and cookies.  Exercise Follow your exercise program as told by your doctor.  Be more active. Try gardening, walking, and taking the stairs.  Ask your doctor about ways that you can be more active.  Medicine  Take over-the-counter and prescription medicines only as told by your doctor. This information is not intended to replace advice given to you by your health care provider. Make sure you discuss any questions you have with your health care provider. Document Released: 08/12/2008 Document Revised: 12/16/2015 Document Reviewed: 11/26/2015 Elsevier Interactive Patient Education  [employer].

## 2017-10-02 ENCOUNTER — Other Ambulatory Visit: Payer: Self-pay | Admitting: Cardiovascular Disease

## 2017-10-12 ENCOUNTER — Encounter: Payer: BLUE CROSS/BLUE SHIELD | Admitting: *Deleted

## 2017-10-12 VITALS — BP 132/62 | HR 53 | Wt 218.2 lb

## 2017-10-12 DIAGNOSIS — Z006 Encounter for examination for normal comparison and control in clinical research program: Secondary | ICD-10-CM

## 2017-10-12 NOTE — Progress Notes (Signed)
All elements of the informed consent form, study requirements and expectations were reviewed with the subject. All questions and concerns were identified and addressed prior to the signing of the consent.  No procedures were performed prior to consenting the subject. The subject was given an adequate amount of time to make an informed decision. A copy of the consent was provided to the patient to take home.   Subject to research clinic for screening visit S1-W5 in the clear research study. No c/o, aes, or saes to report. Waiting for lab work results to verify eligibility for study.

## 2017-10-24 ENCOUNTER — Ambulatory Visit: Payer: Self-pay | Admitting: Cardiovascular Disease

## 2017-10-24 ENCOUNTER — Encounter

## 2017-10-26 ENCOUNTER — Other Ambulatory Visit: Payer: Self-pay

## 2017-10-26 ENCOUNTER — Encounter: Payer: BLUE CROSS/BLUE SHIELD | Admitting: *Deleted

## 2017-11-16 ENCOUNTER — Other Ambulatory Visit: Payer: Self-pay | Admitting: *Deleted

## 2017-11-16 DIAGNOSIS — Z006 Encounter for examination for normal comparison and control in clinical research program: Secondary | ICD-10-CM

## 2017-11-16 MED ORDER — AMBULATORY NON FORMULARY MEDICATION: 180.0000 mg | Freq: Every day | Status: DC

## 2017-11-22 DIAGNOSIS — M25561 Pain in right knee: Secondary | ICD-10-CM | POA: Insufficient documentation

## 2017-11-23 ENCOUNTER — Encounter: Payer: BLUE CROSS/BLUE SHIELD | Admitting: *Deleted

## 2017-11-23 VITALS — BP 149/64 | HR 46 | Wt 214.8 lb

## 2017-11-23 DIAGNOSIS — Z006 Encounter for examination for normal comparison and control in clinical research program: Secondary | ICD-10-CM

## 2017-11-23 NOTE — Progress Notes (Signed)
Subject to research clinic for visit T1W0 in the Clear Research study. No cos, aes or saes to report. 97% compliant with meds, new drug dispensed.  Next clinic visit scheduled.

## 2017-12-22 ENCOUNTER — Encounter: Payer: Self-pay | Admitting: *Deleted

## 2017-12-22 DIAGNOSIS — Z006 Encounter for examination for normal comparison and control in clinical research program: Secondary | ICD-10-CM

## 2017-12-22 NOTE — Progress Notes (Signed)
Patient to research clinic for visit T2M1 in the Clear Research study.  No cos, aes or saes to report.  100% compliant with meds, meds re-dispensed and next clinic visit scheduled.

## 2018-01-22 ENCOUNTER — Telehealth: Payer: Self-pay | Admitting: *Deleted

## 2018-01-22 NOTE — Telephone Encounter (Signed)
Mr. Derrick [name] is in the Clear, bempedoic acid, research study.  He has been having some issues with cramping in his back and legs.  He went to his "calcium doctor" and was informed that his creatinine was elevated, stating she said somewhere close to 2.  I checked on his last labs with us in the clinic and his creatinine was 1.41 mg/dL with a BUN/Urea level of 23 mg/dL.  He is going to see his kidney doctor and was wondering if increased creatinine levels was a side effect of the study drug.  Per protocol issued inclusion/exclusion requirements it is not.

## 2018-02-22 ENCOUNTER — Ambulatory Visit: Payer: BLUE CROSS/BLUE SHIELD | Admitting: Cardiovascular Disease

## 2018-02-22 ENCOUNTER — Encounter: Payer: Self-pay | Admitting: Cardiovascular Disease

## 2018-02-22 VITALS — BP 126/54 | HR 63 | Ht 74.0 in | Wt 217.0 lb

## 2018-02-22 DIAGNOSIS — I2510 Atherosclerotic heart disease of native coronary artery without angina pectoris: Secondary | ICD-10-CM | POA: Diagnosis not present

## 2018-02-22 DIAGNOSIS — I480 Paroxysmal atrial fibrillation: Secondary | ICD-10-CM | POA: Diagnosis not present

## 2018-02-22 DIAGNOSIS — R0989 Other specified symptoms and signs involving the circulatory and respiratory systems: Secondary | ICD-10-CM | POA: Diagnosis not present

## 2018-02-22 NOTE — Progress Notes (Signed)
Derrick [name] Date of Birth  February 08, 1956 [facility] [address]     [address] [place], [place]  [address] [phone]  Fax  [phone]  Problem List: 1. CAD, status post stents to the LAD,  left circumflex artery 2006 . His chronically occluded right coronary artery S/p CABG ( bilateral IMA )  2. hyperlipidemia-intolerant to statins 3. Hypertension 4. Peripheral Vascular disease    62 yo male with history of CAD - s/p stents to LAD and LCx.  Chronically occluded RCA.  No angina.  He eats a very fatty diet - lots of fried foods.  Not exercising.  He does lots of yard work and has never had any episodes of angina.  Oct 01, 2013:  Derrick [name] was seen by Derrick [name] in Oct. 2014 for pre-op eval prior to rotator cuff surgery.    His myoview study showed:  Low risk stress nuclear study with a large, severe intensity, partially reversible inferior defect consistent with prior inferior infarct and very mild peri-infarct ischemia.  LV Ejection Fraction: 54%. LV Wall Motion: Inferior hypokinesis.  He has shoulder surgery without difficulty.  He's doing well. He denies any chest pain or shortness of breath.  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     Feb. 12 2018:    Has done well.  Has hx of PVD - Derrick [name])   Oct. 1 2018;    Derrick [name] has been having some DOE , chest pain with exertion Multiple occasions over the past month,,   Gradually getting worse   Occurred while he was pruning and cleaning up his yard.  Is exercising more.  Works 11 hours a day . 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  Symptoms feel very similar to his previous symptoms in 2006  May 05, 2017:  Derrick [name] is doing well.  He had a cath in October that showed significant left main disease as well as other narrowings.  He had coronary artery bypass grafting.  He is done well since that time.  He had some postoperative atrial fibrillation.  He is on Cardizem 120 mg a day  as well as metoprolol 25 mg twice a day.  He was not started on anticoagulation.  August 31, 2017: Derrick [name] is seen today for follow-up visit.  He has a history of coronary artery bypass grafting with postoperative atrial fibrillation.  History of hypertension and hyperlipidemia.  Has gained some weight.  Walks some  No CP ,   Lipids are elevatee.  intolerent to statins   Sept. 26, 2019: Doing well since his CABG in Oct. 2018 Was noted to have very minimal carotid artery disease at that time. Walks some .   9000-10000 steps a day  Still works  Oncologist )   Is in the Clear trial .      Current Outpatient Medications  Medication Sig Dispense Refill  . acetaminophen (TYLENOL) 650 MG CR tablet Take 650 mg by mouth 2 (two) times daily as needed for pain.     Marland Kitchen AMBULATORY NON FORMULARY MEDICATION Take 180 mg by mouth daily. Medication Name: bempedoic acid vs placebo study drug provided    . aspirin EC 81 MG tablet Take 1 tablet (81 mg total) by mouth daily.    . calcitRIOL (ROCALTROL) 0.5 MCG capsule Take 0.5 mcg by mouth daily.    . calcium carbonate (OSCAL) 1500 (600 Ca) MG TABS tablet Take 600 mg of elemental  calcium 2 (two) times daily with a meal by mouth. Patient takes 2 in the morning and 1 in the evening.    . Cyanocobalamin (VITAMIN B-12) 2500 MCG SUBL Place 2,500 mcg under the tongue daily.    Marland Kitchen diltiazem (CARDIZEM CD) 120 MG 24 hr capsule TAKE 1 CAPSULE BY MOUTH EVERY DAY 90 capsule 1  . ezetimibe (ZETIA) 10 MG tablet Take 10 mg by mouth daily.      . fenofibrate 160 MG tablet Take 160 mg by mouth daily.    . fluticasone (FLONASE) 50 MCG/ACT nasal spray Place 2 sprays into both nostrils daily as needed for allergies or rhinitis.    Marland Kitchen gabapentin (NEURONTIN) 300 MG capsule Take 300 mg by mouth every evening.   6  . losartan (COZAAR) 100 MG tablet Take 100 mg by mouth every evening.     . metoprolol tartrate (LOPRESSOR) 25 MG tablet TAKE 1 TABLET BY MOUTH TWICE A DAY 180 tablet 1  .  nitroGLYCERIN (NITROSTAT) 0.4 MG SL tablet Place 1 tablet (0.4 mg total) under the tongue every 5 (five) minutes as needed for chest pain. 25 tablet 12  . Omega-3 Fatty Acids (OMEGA-3 EPA FISH OIL PO) Take 3,210 mg by mouth 2 (two) times daily.    . ranitidine (ZANTAC) 150 MG tablet Take 150 mg by mouth 2 (two) times daily.    . traZODone (DESYREL) 50 MG tablet Take 50 mg by mouth at bedtime.    . TURMERIC PO Take 500 mg by mouth daily.      No current facility-administered medications for this visit.      Allergies  Allergen Reactions  . Atorvastatin     Muscle aches  . Codeine Itching  . Crestor [Rosuvastatin Calcium]     Muscle aches  . Meloxicam Other (See Comments)    Can not take due to kidneys  . Statins Other (See Comments)    myalgia    Past Medical History:  Diagnosis Date  . Coronary artery disease 2006   stents x2=LAD LCX; totally occluded RCA b. CABG 03/02/17 x2 (left internal mammary artery to LAD, Y-graft right mammary from left mammary to OM).  . Dyslipidemia   . GERD (gastroesophageal reflux disease)   . History of bronchitis   . Hyperlipidemia    statin intolerant  . Hyperparathyroidism   . Hypertension   . Insomnia   . Metatarsalgia   . OA (osteoarthritis)   . Peripheral vascular disease (HCC)   . Seasonal allergies   . Urinary frequency   . Wears glasses     Past Surgical History:  Procedure Laterality Date  . CARDIAC CATHETERIZATION  10/28/2004   circ and mid LAD chyper stents  . CERVICAL FUSION  [id number]  . CERVICAL SPINE SURGERY  2004  . COLONOSCOPY    . CORONARY ANGIOPLASTY  2006   stents placed   . CORONARY ARTERY BYPASS GRAFT N/A 03/02/2017   Procedure: CORONARY ARTERY BYPASS GRAFTING (CABG), ON PUMP, TIMES TWO, USING BILATERAL MAMMARY ARTERIES;  Surgeon: Loreli Slot, MD;  [facility];  Service: Open Heart Surgery;  Laterality: N/A;  . ELBOW ARTHROPLASTY  1994   right  . ENDARTERECTOMY FEMORAL Left 03/17/2015   Procedure:  ENDARTERECTOMY COMMON FEMORAL WITH PROFUNDOPLASTY;  Surgeon: Sherren Kerns, MD;  Location: [facility] OR;  Service: Vascular;  Laterality: Left;  . FEMORAL-POPLITEAL BYPASS GRAFT Left 03/17/2015   Procedure: BYPASS GRAFT FEMORAL-POPLITEAL ARTERY-LEFT LEG AND POSTERIOR TIBIAL SEQUENTIAL GRAFT TO BELOW KNEE POPLITEAL  ARTERY;  Surgeon: Sherren Kerns, MD;  Location: [facility] OR;  Service: Vascular;  Laterality: Left;  . FEMORAL-POPLITEAL BYPASS GRAFT Right 05/11/2015   Procedure:  RIGHT FEMORAL-BELOW THE KNEE POPLITEAL ARTERY BYPASS GRAFT USING NON REVERSE RIGHT GREATER SAPHENOUS VEIN;  Surgeon: Sherren Kerns, MD;  Location: [facility] OR;  Service: Vascular;  Laterality: Right;  . INCISION AND DRAINAGE ABSCESS POSTERIOR CERVICALSPINE  2009  . INTRAOPERATIVE ARTERIOGRAM Left 03/17/2015   Procedure: INTRA OPERATIVE ARTERIOGRAM X2;  Surgeon: Sherren Kerns, MD;  Location: [facility] OR;  Service: Vascular;  Laterality: Left;  . INTRAOPERATIVE ARTERIOGRAM Right 05/11/2015   Procedure: INTRA OPERATIVE ARTERIOGRAM;  Surgeon: Sherren Kerns, MD;  Location: [facility] OR;  Service: Vascular;  Laterality: Right;  . LEFT HEART CATH AND CORONARY ANGIOGRAPHY N/A 03/01/2017   Procedure: LEFT HEART CATH AND CORONARY ANGIOGRAPHY;  Surgeon: Kathleene Hazel, MD;  [facility] INVAS[facility] LAB;  Service: Cardiovascular;  Laterality: N/A;  . PATCH ANGIOPLASTY Left 03/17/2015   Procedure: VEIN PATCH ANGIOPLASTY COMMON FEMORAL ARTERY;  Surgeon: Sherren Kerns, MD;  Location: [facility] OR;  Service: Vascular;  Laterality: Left;  . PERIPHERAL VASCULAR CATHETERIZATION N/A 01/16/2015   Procedure: Abdominal Aortogram;  Surgeon: Sherren Kerns, MD;  Location: [facility] INVASIVE CV LAB;  Service: Cardiovascular;  Laterality: N/A;  . SHOULDER OPEN ROTATOR CUFF REPAIR Right 04/23/2013   Procedure: RIGHT TWO TENDON ROTATOR CUFF REPAIR  SUBACROMIAL DECOMPRESSION AND OPEN DISTAL CLAVICLE RESECTION;  Surgeon: Wyn Forster., MD;  Location: [facility];  Service: Orthopedics;  Laterality: Right;  . stents placed   2006  . TEE WITHOUT CARDIOVERSION N/A 03/02/2017   Procedure: TRANSESOPHAGEAL ECHOCARDIOGRAM (TEE);  Surgeon: Loreli Slot, MD;  Location: [facility] OR;  Service: Open Heart Surgery;  Laterality: N/A;  . VEIN HARVEST Right 05/11/2015   Procedure: WITH NON REVERSE RIGHT GREATER SAPHENOUS VEIN HARVEST;  Surgeon: Sherren Kerns, MD;  Location: [facility] OR;  Service: Vascular;  Laterality: Right;    Social History   Tobacco Use  Smoking Status Former Smoker  . Types: Cigarettes  . Last attempt to quit: 05/30/1989  . Years since quitting: 28.7  Smokeless Tobacco Never Used    Social History   Substance and Sexual Activity  Alcohol Use Yes  . Alcohol/week: 0.0 standard drinks   Comment: one drink a month    Family History  Problem Relation Age of Onset  . Arthritis Mother   . Varicose Veins Mother   . Lung cancer Father   . COPD Father        Lung  . Cancer Brother        Lukemia  . COPD Brother   . Cancer Brother        Prostate    Reviw of Systems:  Reviewed in the HPI.  All other systems are negative.  Physical Exam: Blood pressure (!) 126/54, pulse 63, height 6\' 2"  (1.88 m), weight 217 lb (98.4 kg), SpO2 96 %.  GEN:  Well nourished, well developed in no acute distress HEENT: Normal NECK: No JVD;  Soft right carotid bruit  LYMPHATICS: No lymphadenopathy CARDIAC: RRR   RESPIRATORY:  Clear to auscultation without rales, wheezing or rhonchi  ABDOMEN: Soft, non-tender, non-distended MUSCULOSKELETAL:  No edema; No deformity  SKIN: Warm and dry NEUROLOGIC:  Alert and oriented x 3   ECG:    Assessment / Plan:    1. CAD -    He is s/p CABG . Doing well  2.  Hy his LDL is 145.     In in the Clear trial   3.   Carotid artery disease: has a soft right carotid bruit  He was found to have mild carotid artery disease by pre-surgical vascular study. Repeat carotid duplex at [facility] ( he is already a  patient of Derrick [name] )       Kristeen Miss, MD  02/22/2018 4:43 PM    [facility] [address] [place],  [address] [place], [place]  [address] Pager [phone] Phone: [phone]; Fax: [phone]

## 2018-02-22 NOTE — Patient Instructions (Addendum)
Medication Instructions:  Your physician recommends that you continue on your current medications as directed. Please refer to the Current Medication list given to you today.   Labwork: None Ordered   Testing/Procedures: Your physician has requested that you have a carotid duplex. This test is an ultrasound of the carotid arteries in your neck. It looks at blood flow through these arteries that supply the brain with blood. Allow one hour for this exam. There are no restrictions or special instructions. [facility] will call you to schedule    Follow-Up: Your physician wants you to follow-up in: 6 months with Dr. Elease Hashimoto. You will receive a reminder letter in the mail two months in advance. If you don't receive a letter, please call our office to schedule the follow-up appointment.   If you need a refill on your cardiac medications before your next appointment, please call your pharmacy.   Thank you for choosing [facility]! Eligha Bridegroom, RN [phone]

## 2018-02-23 ENCOUNTER — Encounter: Payer: Self-pay | Admitting: *Deleted

## 2018-02-23 DIAGNOSIS — Z006 Encounter for examination for normal comparison and control in clinical research program: Secondary | ICD-10-CM

## 2018-02-23 NOTE — Research (Signed)
Patient to research clinic for visit T3M3 in the Clear Research study.  No cos, aes or saes to report.  99% compliant with meds and new drug dispensed.  Next clinic visit scheduled.

## 2018-03-16 ENCOUNTER — Other Ambulatory Visit: Payer: Self-pay | Admitting: Nephrology

## 2018-03-16 DIAGNOSIS — Z951 Presence of aortocoronary bypass graft: Secondary | ICD-10-CM

## 2018-03-16 DIAGNOSIS — D649 Anemia, unspecified: Secondary | ICD-10-CM

## 2018-03-16 DIAGNOSIS — I129 Hypertensive chronic kidney disease with stage 1 through stage 4 chronic kidney disease, or unspecified chronic kidney disease: Secondary | ICD-10-CM

## 2018-03-16 DIAGNOSIS — N183 Chronic kidney disease, stage 3 unspecified: Secondary | ICD-10-CM

## 2018-03-16 DIAGNOSIS — N179 Acute kidney failure, unspecified: Secondary | ICD-10-CM

## 2018-03-16 DIAGNOSIS — I739 Peripheral vascular disease, unspecified: Secondary | ICD-10-CM

## 2018-03-16 DIAGNOSIS — E785 Hyperlipidemia, unspecified: Secondary | ICD-10-CM

## 2018-03-16 DIAGNOSIS — N2581 Secondary hyperparathyroidism of renal origin: Secondary | ICD-10-CM

## 2018-03-16 DIAGNOSIS — I2510 Atherosclerotic heart disease of native coronary artery without angina pectoris: Secondary | ICD-10-CM

## 2018-03-16 DIAGNOSIS — Z9889 Other specified postprocedural states: Secondary | ICD-10-CM

## 2018-03-19 ENCOUNTER — Other Ambulatory Visit: Payer: Self-pay | Admitting: Cardiovascular Disease

## 2018-03-19 ENCOUNTER — Telehealth: Payer: Self-pay | Admitting: Cardiovascular Disease

## 2018-03-19 NOTE — Telephone Encounter (Signed)
New Message:   Patient calling because he has a question concerning getting a MRI

## 2018-03-19 NOTE — Telephone Encounter (Signed)
Spoke with patient who called to ask if his cardiac stents are compatible with MRI. He states he is having problems with his knees and needs an MRI. Patient had CABG approximately 1 year ago and I advised there is no contraindication from a cardiac standpoint. Patient verbalized understanding and agreement and thanked me for the call.

## 2018-03-26 ENCOUNTER — Other Ambulatory Visit: Payer: Self-pay | Admitting: Cardiovascular Disease

## 2018-03-27 ENCOUNTER — Ambulatory Visit
Admission: RE | Admit: 2018-03-27 | Discharge: 2018-03-27 | Disposition: A | Discharge status: Home / Self Care | Payer: BLUE CROSS/BLUE SHIELD | Source: Ambulatory Visit | Attending: Nephrology | Admitting: Nephrology

## 2018-03-27 DIAGNOSIS — I739 Peripheral vascular disease, unspecified: Secondary | ICD-10-CM

## 2018-03-27 DIAGNOSIS — N2581 Secondary hyperparathyroidism of renal origin: Secondary | ICD-10-CM

## 2018-03-27 DIAGNOSIS — N183 Chronic kidney disease, stage 3 unspecified: Secondary | ICD-10-CM

## 2018-03-27 DIAGNOSIS — I2510 Atherosclerotic heart disease of native coronary artery without angina pectoris: Secondary | ICD-10-CM

## 2018-03-27 DIAGNOSIS — N179 Acute kidney failure, unspecified: Secondary | ICD-10-CM

## 2018-03-27 DIAGNOSIS — Z9889 Other specified postprocedural states: Secondary | ICD-10-CM

## 2018-03-27 DIAGNOSIS — D649 Anemia, unspecified: Secondary | ICD-10-CM

## 2018-03-27 DIAGNOSIS — Z951 Presence of aortocoronary bypass graft: Secondary | ICD-10-CM

## 2018-03-27 DIAGNOSIS — E785 Hyperlipidemia, unspecified: Secondary | ICD-10-CM

## 2018-03-27 DIAGNOSIS — I129 Hypertensive chronic kidney disease with stage 1 through stage 4 chronic kidney disease, or unspecified chronic kidney disease: Secondary | ICD-10-CM

## 2018-04-02 DIAGNOSIS — J300 Vasomotor rhinitis: Secondary | ICD-10-CM | POA: Insufficient documentation

## 2018-04-02 DIAGNOSIS — H9012 Conductive hearing loss, unilateral, left ear, with unrestricted hearing on the contralateral side: Secondary | ICD-10-CM | POA: Insufficient documentation

## 2018-04-02 DIAGNOSIS — H6062 Unspecified chronic otitis externa, left ear: Secondary | ICD-10-CM | POA: Insufficient documentation

## 2018-04-19 ENCOUNTER — Ambulatory Visit (INDEPENDENT_AMBULATORY_CARE_PROVIDER_SITE_OTHER): Payer: BLUE CROSS/BLUE SHIELD | Admitting: Family

## 2018-04-19 ENCOUNTER — Other Ambulatory Visit: Payer: Self-pay

## 2018-04-19 ENCOUNTER — Ambulatory Visit (INDEPENDENT_AMBULATORY_CARE_PROVIDER_SITE_OTHER)
Admission: RE | Admit: 2018-04-19 | Discharge: 2018-04-19 | Disposition: A | Discharge status: Home / Self Care | Payer: BLUE CROSS/BLUE SHIELD | Source: Ambulatory Visit | Attending: Family | Admitting: Family

## 2018-04-19 ENCOUNTER — Encounter: Payer: Self-pay | Admitting: Family

## 2018-04-19 ENCOUNTER — Ambulatory Visit (HOSPITAL_COMMUNITY)
Admission: RE | Admit: 2018-04-19 | Discharge: 2018-04-19 | Disposition: A | Discharge status: Home / Self Care | Payer: BLUE CROSS/BLUE SHIELD | Source: Ambulatory Visit | Attending: Family | Admitting: Family

## 2018-04-19 VITALS — BP 147/79 | HR 103 | Temp 97.2°F | Resp 16 | Ht 74.0 in | Wt 209.0 lb

## 2018-04-19 DIAGNOSIS — I6523 Occlusion and stenosis of bilateral carotid arteries: Secondary | ICD-10-CM | POA: Diagnosis not present

## 2018-04-19 DIAGNOSIS — Z95828 Presence of other vascular implants and grafts: Secondary | ICD-10-CM | POA: Diagnosis not present

## 2018-04-19 DIAGNOSIS — Z87891 Personal history of nicotine dependence: Secondary | ICD-10-CM

## 2018-04-19 DIAGNOSIS — R0989 Other specified symptoms and signs involving the circulatory and respiratory systems: Secondary | ICD-10-CM

## 2018-04-19 DIAGNOSIS — I2510 Atherosclerotic heart disease of native coronary artery without angina pectoris: Secondary | ICD-10-CM | POA: Insufficient documentation

## 2018-04-19 DIAGNOSIS — I779 Disorder of arteries and arterioles, unspecified: Secondary | ICD-10-CM | POA: Insufficient documentation

## 2018-04-19 NOTE — Progress Notes (Signed)
[facility] [place]   CC: Follow up peripheral artery occlusive disease   History of Present Illness Derrick [name] is a 62 y.o. male who is s/p left femoral to posterior tibial bypass and a sequential graft to the below-knee popliteal artery on March 17, 2015. He subsequently underwent right femoral to below-knee popliteal bypass using saphenous vein on May 11, 2015 by Dr. Darrick Penna.  He is also s/p bilateral CIA stents placed on 01-16-15.   He presents today for follow-up. He denies any claudication symptoms with walking. He is participating in cardiac rehab now.   He had a CABG on 03-02-17, several coronary artery stents placed prior to this.   He denies any hx of stroke or TIA.   Diabetic: No Tobacco use: former smoker, quit in 1991, started in his teens  Pt meds include: Statin :No, causes myalgias. He is in a study for a medication vs placebo Betablocker: Yes ASA: Yes, 81 mg Other anticoagulants/antiplatelets: no    Past Medical History:  Diagnosis Date  . Coronary artery disease 2006   stents x2=LAD LCX; totally occluded RCA b. CABG 03/02/17 x2 (left internal mammary artery to LAD, Y-graft right mammary from left mammary to OM).  . Dyslipidemia   . GERD (gastroesophageal reflux disease)   . History of bronchitis   . Hyperlipidemia    statin intolerant  . Hyperparathyroidism   . Hypertension   . Insomnia   . Metatarsalgia   . OA (osteoarthritis)   . Peripheral vascular disease (HCC)   . Seasonal allergies   . Urinary frequency   . Wears glasses     Social History Social History   Tobacco Use  . Smoking status: Former Smoker    Types: Cigarettes    Last attempt to quit: 05/30/1989    Years since quitting: 28.9  . Smokeless tobacco: Never Used  Substance Use Topics  . Alcohol use: Yes    Alcohol/week: 0.0 standard drinks    Comment: one drink a month  . Drug use: No    Family History Family History  Problem Relation  Age of Onset  . Arthritis Mother   . Varicose Veins Mother   . Lung cancer Father   . COPD Father        Lung  . Cancer Brother        Lukemia  . COPD Brother   . Cancer Brother        Prostate    Past Surgical History:  Procedure Laterality Date  . CARDIAC CATHETERIZATION  10/28/2004   circ and mid LAD chyper stents  . CERVICAL FUSION  [id number]  . CERVICAL SPINE SURGERY  2004  . COLONOSCOPY    . CORONARY ANGIOPLASTY  2006   stents placed   . CORONARY ARTERY BYPASS GRAFT N/A 03/02/2017   Procedure: CORONARY ARTERY BYPASS GRAFTING (CABG), ON PUMP, TIMES TWO, USING BILATERAL MAMMARY ARTERIES;  Surgeon: Loreli Slot, MD;  [facility];  Service: Open Heart Surgery;  Laterality: N/A;  . ELBOW ARTHROPLASTY  1994   right  . ENDARTERECTOMY FEMORAL Left 03/17/2015   Procedure: ENDARTERECTOMY COMMON FEMORAL WITH PROFUNDOPLASTY;  Surgeon: Sherren Kerns, MD;  Location: [facility] OR;  Service: Vascular;  Laterality: Left;  . FEMORAL-POPLITEAL BYPASS GRAFT Left 03/17/2015   Procedure: BYPASS GRAFT FEMORAL-POPLITEAL ARTERY-LEFT LEG AND POSTERIOR TIBIAL SEQUENTIAL GRAFT TO BELOW KNEE POPLITEAL ARTERY;  Surgeon: Sherren Kerns, MD;  Location: [facility] OR;  Service: Vascular;  Laterality: Left;  .  FEMORAL-POPLITEAL BYPASS GRAFT Right 05/11/2015   Procedure:  RIGHT FEMORAL-BELOW THE KNEE POPLITEAL ARTERY BYPASS GRAFT USING NON REVERSE RIGHT GREATER SAPHENOUS VEIN;  Surgeon: Sherren Kerns, MD;  Location: [facility] OR;  Service: Vascular;  Laterality: Right;  . INCISION AND DRAINAGE ABSCESS POSTERIOR CERVICALSPINE  2009  . INTRAOPERATIVE ARTERIOGRAM Left 03/17/2015   Procedure: INTRA OPERATIVE ARTERIOGRAM X2;  Surgeon: Sherren Kerns, MD;  Location: [facility] OR;  Service: Vascular;  Laterality: Left;  . INTRAOPERATIVE ARTERIOGRAM Right 05/11/2015   Procedure: INTRA OPERATIVE ARTERIOGRAM;  Surgeon: Sherren Kerns, MD;  Location: [facility] OR;  Service: Vascular;  Laterality: Right;  . LEFT HEART CATH AND  CORONARY ANGIOGRAPHY N/A 03/01/2017   Procedure: LEFT HEART CATH AND CORONARY ANGIOGRAPHY;  Surgeon: Kathleene Hazel, MD;  [facility] INV[facility] LAB;  Service: Cardiovascular;  Laterality: N/A;  . PATCH ANGIOPLASTY Left 03/17/2015   Procedure: VEIN PATCH ANGIOPLASTY COMMON FEMORAL ARTERY;  Surgeon: Sherren Kerns, MD;  Location: [facility] OR;  Service: Vascular;  Laterality: Left;  . PERIPHERAL VASCULAR CATHETERIZATION N/A 01/16/2015   Procedure: Abdominal Aortogram;  Surgeon: Sherren Kerns, MD;  Location: [facility] INVASIVE CV LAB;  Service: Cardiovascular;  Laterality: N/A;  . SHOULDER OPEN ROTATOR CUFF REPAIR Right 04/23/2013   Procedure: RIGHT TWO TENDON ROTATOR CUFF REPAIR  SUBACROMIAL DECOMPRESSION AND OPEN DISTAL CLAVICLE RESECTION;  Surgeon: Wyn Forster., MD;  Location:  SURGERY CENTER;  Service: Orthopedics;  Laterality: Right;  . stents placed   2006  . TEE WITHOUT CARDIOVERSION N/A 03/02/2017   Procedure: TRANSESOPHAGEAL ECHOCARDIOGRAM (TEE);  Surgeon: Loreli Slot, MD;  Location: [facility] OR;  Service: Open Heart Surgery;  Laterality: N/A;  . VEIN HARVEST Right 05/11/2015   Procedure: WITH NON REVERSE RIGHT GREATER SAPHENOUS VEIN HARVEST;  Surgeon: Sherren Kerns, MD;  Location: [facility] OR;  Service: Vascular;  Laterality: Right;    Allergies  Allergen Reactions  . Atorvastatin     Muscle aches  . Codeine Itching  . Crestor [Rosuvastatin Calcium]     Muscle aches  . Meloxicam Other (See Comments)    Can not take due to kidneys  . Statins Other (See Comments)    myalgia    Current Outpatient Medications  Medication Sig Dispense Refill  . diltiazem (CARDIZEM CD) 120 MG 24 hr capsule TAKE 1 CAPSULE BY MOUTH EVERY DAY 90 capsule 1  . metoprolol tartrate (LOPRESSOR) 25 MG tablet TAKE 1 TABLET BY MOUTH TWICE A DAY 180 tablet 1  . acetaminophen (TYLENOL) 650 MG CR tablet Take 650 mg by mouth 2 (two) times daily as needed for pain.     Marland Kitchen AMBULATORY NON FORMULARY  MEDICATION Take 180 mg by mouth daily. Medication Name: bempedoic acid vs placebo study drug provided    . aspirin EC 81 MG tablet Take 1 tablet (81 mg total) by mouth daily.    . calcitRIOL (ROCALTROL) 0.5 MCG capsule Take 0.5 mcg by mouth daily.    . calcium carbonate (OSCAL) 1500 (600 Ca) MG TABS tablet Take 600 mg of elemental calcium 2 (two) times daily with a meal by mouth. Patient takes 2 in the morning and 1 in the evening.    . Cyanocobalamin (VITAMIN B-12) 2500 MCG SUBL Place 2,500 mcg under the tongue daily.    Marland Kitchen ezetimibe (ZETIA) 10 MG tablet Take 10 mg by mouth daily.      . fenofibrate 160 MG tablet Take 160 mg by mouth daily.    . fluticasone (FLONASE)  50 MCG/ACT nasal spray Place 2 sprays into both nostrils daily as needed for allergies or rhinitis.    Marland Kitchen gabapentin (NEURONTIN) 300 MG capsule Take 300 mg by mouth every evening.   6  . losartan (COZAAR) 100 MG tablet Take 100 mg by mouth every evening.     . nitroGLYCERIN (NITROSTAT) 0.4 MG SL tablet Place 1 tablet (0.4 mg total) under the tongue every 5 (five) minutes as needed for chest pain. 25 tablet 5  . Omega-3 Fatty Acids (OMEGA-3 EPA FISH OIL PO) Take 3,210 mg by mouth 2 (two) times daily.    . ranitidine (ZANTAC) 150 MG tablet Take 150 mg by mouth 2 (two) times daily.    . traZODone (DESYREL) 50 MG tablet Take 50 mg by mouth at bedtime.    . TURMERIC PO Take 500 mg by mouth daily.      No current facility-administered medications for this visit.     ROS: See HPI for pertinent positives and negatives.   Physical Examination  Vitals:   04/19/18 1026 04/19/18 1029  BP: (!) 151/74 (!) 147/79  Pulse: (!) 103   Resp: 16   Temp: (!) 97.2 F (36.2 C)   TempSrc: Oral   SpO2: 94%   Weight: 209 lb (94.8 kg)   Height: 6\' 2"  (1.88 m)    Body mass index is 26.83 kg/m.  General: A&O x 3, WDWN, male. Gait: normal Eyes: PERRLA. Pulmonary: Respirations are non labored, CTAB, good air movement Cardiac: regular rhythm,  no detected murmur. Recheck of heat rate is 64/minute     Carotid Bruits Right Left   positive negative   Radial pulses are 2+ palpable bilaterally   Adominal aortic pulse is not palpable                         VASCULAR EXAM: Extremities without ischemic changes, without Gangrene; without open wounds.                                                                                                                                                       LE Pulses Right Left       FEMORAL  2+ palpable  2+ palpable        POPLITEAL  2+ palpable   2+ palpable       POSTERIOR TIBIAL  not palpable   1+ palpable        DORSALIS PEDIS      ANTERIOR TIBIAL 2+ palpable  2+ palpable    Abdomen: soft, NT, no palpable masses. Skin: no rashes, no ulcers noted. Musculoskeletal: no muscle wasting or atrophy.      Neurologic: A&O X 3; Appropriate Affect ; SENSATION: normal; MOTOR FUNCTION:  moving all extremities equally, motor strength 5/5 throughout. Speech is fluent/normal. CN  2-12 intact.    ASSESSMENT: Derrick [name] is a 62 y.o. male who is s/p left femoral to posterior tibial bypass and a sequential graft to the below-knee popliteal artery on March 17, 2015. He subsequently underwent right femoral to below-knee popliteal bypass using saphenous vein on May 11, 2015.  He is also s/p bilateral CIA stents placed on 01-16-15.  He no longer has claudication symptoms with walking and walks a great deal. There are no signs of ischemia in his feet or legs.  His bilateral pedal pulses are palpable.   He has no history of stroke or TIA. He has a right carotid bruit.   DATA  Carotid Duplex (04-19-18): Right ICA: 40-59% stenosis Left ICA: 1-39% stenosis Bilateral vertebral artery flow is antegrade.  Left subclavian artery waveforms are normal, right are stenotic.  No previous carotid duplex on file for comparison.    Bilateral LE Arterial Duplex (04-19-18): Right: Highest  velocity is 211 cm/s at the proximal anastomosis. All biphasic waveforms.  Left: Highest velocity is 189 cm/s at the inflow. Biphasic waveforms from the inflow to proximal graft, triphasic from mid graft to outflow.  No significant change compared to the exams on 04-07-16 and 04-13-17.  ABI (Date: 04/19/2018):  R:   ABI: 1.3 (was 1.12 on 04-13-17),   PT: bi  DP: tri  TBI:  0.70, toe pressure 114, (was 0.29)  L:   ABI: 1.2 (was 1.13),   PT: tri  DP: tri  TBI: 0.68, toe pressure 111, (was 0.75) Bilateral ABI remain normal with tri and biphasic waveforms.  Significant improvement in right TBI.     PLAN:  Based on the patient's vascular studies and examination, pt will return to clinic in 1 year with bilateral LE arterial duplex, ABI's, and carotid duplex. I advised pt to notify us if he develops concerns re the circulation in his feet or legs    I discussed in depth with the patient the nature of atherosclerosis, and emphasized the importance of maximal medical management including strict control of blood pressure, blood glucose, and lipid levels, obtaining regular exercise, and continued cessation of smoking.  The patient is aware that without maximal medical management the underlying atherosclerotic disease process will progress, limiting the benefit of any interventions.  The patient was given information about PAD including signs, symptoms, treatment, what symptoms should prompt the patient to seek immediate medical care, and risk reduction measures to take.  Charisse MarchSuzanne , RN, MSN, [name]-C [facility] [place] Office Phone: [phone]  [facility]  04/19/18 10:35 AM

## 2018-04-19 NOTE — Patient Instructions (Addendum)
Stroke Prevention Some health problems and behaviors may make it more likely for you to have a stroke. Below are ways to lessen your risk of having a stroke.  Be active for at least 30 minutes on most or all days.  Do not smoke. Try not to be around others who smoke.  Do not drink too much alcohol. ? Do not have more than 2 drinks a day if you are a man. ? Do not have more than 1 drink a day if you are a woman and are not pregnant.  Eat healthy foods, such as fruits and vegetables. If you were put on a specific diet, follow the diet as told.  Keep your cholesterol levels under control through diet and medicines. Look for foods that are low in saturated fat, trans fat, cholesterol, and are high in fiber.  If you have diabetes, follow all diet plans and take your medicine as told.  Ask your doctor if you need treatment to lower your blood pressure. If you have high blood pressure (hypertension), follow all diet plans and take your medicine as told by your doctor.  If you are 18-39 years old, have your blood pressure checked every 3-5 years. If you are age 40 or older, have your blood pressure checked every year.  Keep a healthy weight. Eat foods that are low in calories, salt, saturated fat, trans fat, and cholesterol.  Do not take drugs.  Avoid birth control pills, if this applies. Talk to your doctor about the risks of taking birth control pills.  Talk to your doctor if you have sleep problems (sleep apnea).  Take all medicine as told by your doctor. ? You may be told to take aspirin or blood thinner medicine. Take this medicine as told by your doctor. ? Understand your medicine instructions.  Make sure any other conditions you have are being taken care of.  Get help right away if:  You suddenly lose feeling (you feel numb) or have weakness in your face, arm, or leg.  Your face or eyelid hangs down to one side.  You suddenly feel confused.  You have trouble talking  (aphasia) or understanding what people are saying.  You suddenly have trouble seeing in one or both eyes.  You suddenly have trouble walking.  You are dizzy.  You lose your balance or your movements are clumsy (uncoordinated).  You suddenly have a very bad headache and you do not know the cause.  You have new chest pain.  Your heart feels like it is fluttering or skipping a beat (irregular heartbeat). Do not wait to see if the symptoms above go away. Get help right away. Call your local emergency services (911 in [place] Do not drive yourself to the hospital. This information is not intended to replace advice given to you by your health care provider. Make sure you discuss any questions you have with your health care provider. Document Released: 11/15/2011 Document Revised: 10/22/2015 Document Reviewed: 11/16/2012 Elsevier Interactive Patient Education  2018 Else[employer] Inc[employer]     Peripheral Vascular Disease Peripheral vascular disease (PVD) is a disease of the blood vessels that are not part of your heart and brain. A simple term for PVD is poor circulation. In most cases, PVD narrows the blood vessels that carry blood from your heart to the rest of your body. This can result in a decreased supply of blood to your arms, legs, and internal organs, like your stomach or kidneys. However, it most often affects   a person's lower legs and feet. There are two types of PVD.  Organic PVD. This is the more common type. It is caused by damage to the structure of blood vessels.  Functional PVD. This is caused by conditions that make blood vessels contract and tighten (spasm).  Without treatment, PVD tends to get worse over time. PVD can also lead to acute ischemic limb. This is when an arm or limb suddenly has trouble getting enough blood. This is a medical emergency. Follow these instructions at home:  Take medicines only as told by your doctor.  Do not use any tobacco products, including  cigarettes, chewing tobacco, or electronic cigarettes. If you need help quitting, ask your doctor.  Lose weight if you are overweight, and maintain a healthy weight as told by your doctor.  Eat a diet that is low in fat and cholesterol. If you need help, ask your doctor.  Exercise regularly. Ask your doctor for some good activities for you.  Take good care of your feet. ? Wear comfortable shoes that fit well. ? Check your feet often for any cuts or sores. Contact a doctor if:  You have cramps in your legs while walking.  You have leg pain when you are at rest.  You have coldness in a leg or foot.  Your skin changes.  You are unable to get or have an erection (erectile dysfunction).  You have cuts or sores on your feet that are not healing. Get help right away if:  Your arm or leg turns cold and blue.  Your arms or legs become red, warm, swollen, painful, or numb.  You have chest pain or trouble breathing.  You suddenly have weakness in your face, arm, or leg.  You become very confused or you cannot speak.  You suddenly have a very bad headache.  You suddenly cannot see. This information is not intended to replace advice given to you by your health care provider. Make sure you discuss any questions you have with your health care provider. Document Released: 08/10/2009 Document Revised: 10/22/2015 Document Reviewed: 10/24/2013 Elsevier Interactive Patient Education  2017 Elsevier Inc.  

## 2018-04-20 ENCOUNTER — Other Ambulatory Visit: Payer: Self-pay | Admitting: Nurse Practitioner

## 2018-04-20 DIAGNOSIS — R0989 Other specified symptoms and signs involving the circulatory and respiratory systems: Secondary | ICD-10-CM

## 2018-04-20 DIAGNOSIS — I6523 Occlusion and stenosis of bilateral carotid arteries: Secondary | ICD-10-CM

## 2018-04-23 ENCOUNTER — Other Ambulatory Visit: Payer: Self-pay | Admitting: Cardiovascular Disease

## 2018-05-10 ENCOUNTER — Other Ambulatory Visit: Payer: Self-pay | Admitting: Urology

## 2018-05-10 DIAGNOSIS — N281 Cyst of kidney, acquired: Secondary | ICD-10-CM

## 2018-05-25 ENCOUNTER — Encounter: Payer: BLUE CROSS/BLUE SHIELD | Admitting: *Deleted

## 2018-05-25 VITALS — BP 148/71 | HR 61 | Wt 213.0 lb

## 2018-05-25 DIAGNOSIS — Z006 Encounter for examination for normal comparison and control in clinical research program: Secondary | ICD-10-CM

## 2018-05-25 NOTE — Research (Signed)
Patient to research clinic for Visit T4-M6 in the Clear Research study.  No cos, aes or saes to report.  Patient 99% compliant with meds and new drug dispensed.  Next TC and follow up clinic visit scheduled.

## 2018-08-13 ENCOUNTER — Encounter: Payer: Self-pay | Admitting: Cardiovascular Disease

## 2018-08-16 ENCOUNTER — Telehealth: Payer: Self-pay

## 2018-08-16 NOTE — Telephone Encounter (Signed)

## 2018-08-22 ENCOUNTER — Ambulatory Visit: Payer: BLUE CROSS/BLUE SHIELD | Admitting: Cardiovascular Disease

## 2018-08-22 DIAGNOSIS — M5136 Other intervertebral disc degeneration, lumbar region: Secondary | ICD-10-CM | POA: Insufficient documentation

## 2018-09-05 ENCOUNTER — Telehealth: Payer: Self-pay | Admitting: *Deleted

## 2018-09-05 NOTE — Telephone Encounter (Signed)
Called patient for visit T5-M9 in the Clear research study.  No cos, aes or saes to report.  Updated on clinic visits and confirmed next appointment 11/23/18.  Instructed patient to call clinic number if has questions or concerns.

## 2018-09-06 ENCOUNTER — Encounter: Payer: Self-pay | Admitting: *Deleted

## 2018-09-06 NOTE — Telephone Encounter (Signed)
Pt has agreed with Virtual / Video via webex appt, 09/14/2018 Mychart link sent Derrick [name] received     Virtual Visit Pre-Appointment Phone Call  Steps For Call:  1. Confirm [name] - "In the setting of the current Covid19 crisis, you are scheduled for a (phone or video) visit with your provider on (date) at (time).  Just as we do with many in-office visits, in order for you to participate in this visit, we must obtain [name].  If you'd like, I can send this to your mychart (if signed up) or email for you to review.  Otherwise, I can obtain your Derrick [name] now.  All virtual visits are billed to your insurance company just like a normal visit would be.  By agreeing to a virtual visit, we'd like you to understand that the technology does not allow for your provider to perform an examination, and thus may limit your provider's ability to fully assess your condition.  Finally, though the technology is pretty good, we cannot assure that it will always work on either your or our end, and in the setting of a video visit, we may have to convert it to a phone-only visit.  In either situation, we cannot ensure that we have a secure connection.  Are you willing to proceed?"  2. Give patient instructions for WebEx download to smartphone as below if video visit  3. Advise patient to be prepared with any vital sign or heart rhythm information, their current medicines, and a piece of paper and pen handy for any instructions they may receive the day of their visit  4. Inform patient they will receive a phone call 15 minutes prior to their appointment time (may be from unknown caller ID) so they should be prepared to answer  5. Confirm that appointment type is correct in Epic appointment notes (video vs telephone)    TELEPHONE CALL NOTE  Derrick [name] has been deemed a candidate for a follow-up tele-health visit to limit community exposure during the Covid-19 pandemic. I spoke with the patient via  phone to ensure availability of phone/video source, confirm preferred email & phone number, and discuss instructions and expectations.  I reminded [name] to be prepared with any vital sign and/or heart rhythm information that could potentially be obtained via home monitoring, at the time of his visit. I reminded [name] to expect a phone call at the time of his visit if his visit.  Did the patient verbally acknowledge [name] to treatment? 09/14/2018  [name], RMA 09/06/2018 12:14 PM   DOWNLOADING THE WEBEX SOFTWARE TO SMARTPHONE  - If Apple, go to [employer] and type in WebEx in the search bar. Download [employer], the blue/green circle. The app is free but as with any other app downloads, their phone may require them to verify saved payment information or Apple password. The patient does NOT have to create an account.  - If Android, ask patient to go to [employer] and type in WebEx in the search bar. Download [employer], the blue/green circle. The app is free but as with any other app downloads, their phone may require them to verify saved payment information or Android password. The patient does NOT have to create an account.   [name] FOR TELE-HEALTH VISIT - PLEASE REVIEW  I hereby voluntarily request, [name] and authorize [facility] and its employed or contracted physicians, Producer, television/film/video, nurse practitioners or other licensed health care professionals (the  Practitioner), to provide me with telemedicine health care services (the "Services") as deemed necessary by the treating Practitioner. I acknowledge and [name] to receive the Services by the Practitioner via telemedicine. I understand that the telemedicine visit will involve communicating with the Practitioner through live audiovisual communication technology and the disclosure of certain medical information by electronic transmission. I acknowledge that I have been given the  opportunity to request an in-person assessment or other available alternative prior to the telemedicine visit and am voluntarily participating in the telemedicine visit.  I understand that I have the right to withhold or withdraw my [name] to the use of telemedicine in the course of my care at any time, without affecting my right to future care or treatment, and that the Practitioner or I may terminate the telemedicine visit at any time. I understand that I have the right to inspect all information obtained and/or recorded in the course of the telemedicine visit and may receive copies of available information for a reasonable fee.  I understand that some of the potential risks of receiving the Services via telemedicine include:  Marland Kitchen Delay or interruption in medical evaluation due to technological equipment failure or disruption; . Information transmitted may not be sufficient (e.g. poor resolution of images) to allow for appropriate medical decision making by the Practitioner; and/or  . In rare instances, security protocols could fail, causing a breach of personal health information.  Furthermore, I acknowledge that it is my responsibility to provide information about my medical history, conditions and care that is complete and accurate to the best of my ability. I acknowledge that Practitioner's advice, recommendations, and/or decision may be based on factors not within their control, such as incomplete or inaccurate data provided by me or distortions of diagnostic images or specimens that may result from electronic transmissions. I understand that the practice of medicine is not an exact science and that Practitioner makes no warranties or guarantees regarding treatment outcomes. I acknowledge that I will receive a copy of this [name] concurrently upon execution via email to the email address I last provided but may also request a printed copy by calling the office of [facility].    I understand that  my insurance will be billed for this visit.   I have read or had this [name] read to me. . I understand the contents of this [name], which adequately explains the benefits and risks of the Services being provided via telemedicine.  . I have been provided ample opportunity to ask questions regarding this [name] and the Services and have had my questions answered to my satisfaction. . I give my informed [name] for the services to be provided through the use of telemedicine in my medical care  By participating in this telemedicine visit I agree to the above.

## 2018-09-13 NOTE — Progress Notes (Signed)
Virtual Visit via Video Note   This visit type was conducted due to national recommendations for restrictions regarding the COVID-19 Pandemic (e.g. social distancing) in an effort to limit this patient's exposure and mitigate transmission in our community.  Due to his co-morbid illnesses, this patient is at least at moderate risk for complications without adequate follow up.  This format is felt to be most appropriate for this patient at this time.  All issues noted in this document were discussed and addressed.  A limited physical exam was performed with this format.  Please refer to the patient's chart for his consent to telehealth for [facility].   Evaluation Performed:  Follow-up visit  Date:  09/14/2018   ID:  Derrick [name], DOB 1955-10-17, MRN [id number]  Patient Location: Home Provider Location: Office  PCP:  Iva Boop, MD  Cardiologist:  Kristeen Miss, MD  Electrophysiologist:  None   Chief Complaint:  CAD   History of Present Illness:    Derrick [name] is a 63 y.o. male with 63 yo male with history of CAD - s/p stents to LAD and LCx.  Chronically occluded RCA. And CABG 2018.   Had cath for increasing angina 02/2017 and found to have significant CAD including high grade stenosis of the ostial Lt main artery and filing of the RCA from Lt to Rt collaterals.  He then underwent  CABG X 2 with LIMA to LAD, Y-graft RIMA from Lt mammary to OM.  Done by Dr. Dorris Fetch.  EF at that time 65%.  He did have post op a fib and was on amiodarone but no further episodes and this has been stopped.  Also hx of lt fem to post tib bypass and sequential graft to the below the knee popliteal artery 2016  And He subsequently underwent right femoral to below-knee popliteal bypass using saphenous vein on December 12, 2016by Dr. Darrick Penna.  He is also s/p bilateral CIA stents placed on 01-16-15.  Lipids in march 2019 with LDL 145 and referred to lipid clinic. No note. 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  T4-M6  Today he feels well whiteout chest pain or SOB.  occ lightheadedness when going from sitting to standing but last a second.  Hx of vertigo with script for meclizine.  No weakness or fatigue.  He has no palpitations or racing HR.  Has been eating healthy and exercises by walking with his job.  He has been off for 2 weeks and returns on Monday. 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  I asked him to wear a mask as well until we see how things go with more mingling.    His wife is buying the groceries and she does wear a mask as well.       The patient does not have symptoms concerning for COVID-19 infection (fever, chills, cough, or new shortness of breath).     Past Medical History:  Diagnosis Date  . Coronary artery disease 2006   stents x2=LAD LCX; totally occluded RCA b. CABG 03/02/17 x2 (left internal mammary artery to LAD, Y-graft right mammary from left mammary to OM).  . Dyslipidemia   . GERD (gastroesophageal reflux disease)   . History of bronchitis   . Hyperlipidemia    statin intolerant  . Hyperparathyroidism   . Hypertension   . Insomnia   . Metatarsalgia   . OA (osteoarthritis)   . Peripheral vascular disease (HCC)   . Seasonal allergies   .  Urinary frequency   . Wears glasses    Past Surgical History:  Procedure Laterality Date  . CARDIAC CATHETERIZATION  10/28/2004   circ and mid LAD chyper stents  . CERVICAL FUSION  [id number]  . CERVICAL SPINE SURGERY  2004  . COLONOSCOPY    . CORONARY ANGIOPLASTY  2006   stents placed   . CORONARY ARTERY BYPASS GRAFT N/A 03/02/2017   Procedure: CORONARY ARTERY BYPASS GRAFTING (CABG), ON PUMP, TIMES TWO, USING BILATERAL MAMMARY ARTERIES;  Surgeon: Loreli Slot, MD;  Location: [facility] OR;  Service: Open Heart Surgery;  Laterality: N/A;  . ELBOW ARTHROPLASTY  1994   right  . ENDARTERECTOMY FEMORAL Left 03/17/2015   Procedure: ENDARTERECTOMY COMMON FEMORAL WITH PROFUNDOPLASTY;  Surgeon:  Sherren Kerns, MD;  Location: [facility] OR;  Service: Vascular;  Laterality: Left;  . FEMORAL-POPLITEAL BYPASS GRAFT Left 03/17/2015   Procedure: BYPASS GRAFT FEMORAL-POPLITEAL ARTERY-LEFT LEG AND POSTERIOR TIBIAL SEQUENTIAL GRAFT TO BELOW KNEE POPLITEAL ARTERY;  Surgeon: Sherren Kerns, MD;  Location: [facility] OR;  Service: Vascular;  Laterality: Left;  . FEMORAL-POPLITEAL BYPASS GRAFT Right 05/11/2015   Procedure:  RIGHT FEMORAL-BELOW THE KNEE POPLITEAL ARTERY BYPASS GRAFT USING NON REVERSE RIGHT GREATER SAPHENOUS VEIN;  Surgeon: Sherren Kerns, MD;  Location: [facility] OR;  Service: Vascular;  Laterality: Right;  . INCISION AND DRAINAGE ABSCESS POSTERIOR CERVICALSPINE  2009  . INTRAOPERATIVE ARTERIOGRAM Left 03/17/2015   Procedure: INTRA OPERATIVE ARTERIOGRAM X2;  Surgeon: Sherren Kerns, MD;  Location: [facility] OR;  Service: Vascular;  Laterality: Left;  . INTRAOPERATIVE ARTERIOGRAM Right 05/11/2015   Procedure: INTRA OPERATIVE ARTERIOGRAM;  Surgeon: Sherren Kerns, MD;  Location: [facility] OR;  Service: Vascular;  Laterality: Right;  . LEFT HEART CATH AND CORONARY ANGIOGRAPHY N/A 03/01/2017   Procedure: LEFT HEART CATH AND CORONARY ANGIOGRAPHY;  Surgeon: Kathleene Hazel, MD;  Location: [facility] INVASIVE CV LAB;  Service: Cardiovascular;  Laterality: N/A;  . PATCH ANGIOPLASTY Left 03/17/2015   Procedure: VEIN PATCH ANGIOPLASTY COMMON FEMORAL ARTERY;  Surgeon: Sherren Kerns, MD;  Location: [facility] OR;  Service: Vascular;  Laterality: Left;  . PERIPHERAL VASCULAR CATHETERIZATION N/A 01/16/2015   Procedure: Abdominal Aortogram;  Surgeon: Sherren Kerns, MD;  Location: [facility] INVASIVE CV LAB;  Service: Cardiovascular;  Laterality: N/A;  . SHOULDER OPEN ROTATOR CUFF REPAIR Right 04/23/2013   Procedure: RIGHT TWO TENDON ROTATOR CUFF REPAIR  SUBACROMIAL DECOMPRESSION AND OPEN DISTAL CLAVICLE RESECTION;  Surgeon: Wyn Forster., MD;  Location: [facility];  Service: Orthopedics;  Laterality: Right;  . stents  placed   2006  . TEE WITHOUT CARDIOVERSION N/A 03/02/2017   Procedure: TRANSESOPHAGEAL ECHOCARDIOGRAM (TEE);  Surgeon: Loreli Slot, MD;  Location: [facility] OR;  Service: Open Heart Surgery;  Laterality: N/A;  . VEIN HARVEST Right 05/11/2015   Procedure: WITH NON REVERSE RIGHT GREATER SAPHENOUS VEIN HARVEST;  Surgeon: Sherren Kerns, MD;  Location: [facility] OR;  Service: Vascular;  Laterality: Right;     Current Meds  Medication Sig  . acetaminophen (TYLENOL) 650 MG CR tablet Take 650 mg by mouth 2 (two) times daily as needed for pain.   Marland Kitchen aspirin EC 81 MG tablet Take 1 tablet (81 mg total) by mouth daily.  . Bempedoic Acid 180 MG TABS Take 180 mg by mouth daily. Use as directed - for a 3-year period  . calcitRIOL (ROCALTROL) 0.5 MCG capsule Take 0.5 mcg by mouth daily.  . calcium carbonate (OSCAL) 1500 (600 Ca) MG TABS tablet Take  600 mg of elemental calcium 2 (two) times daily with a meal by mouth. Patient takes 2 in the morning and 1 in the evening.  . Cyanocobalamin (VITAMIN B-12) 2500 MCG SUBL Place 2,500 mcg under the tongue daily.  Marland Kitchen diltiazem (CARDIZEM CD) 120 MG 24 hr capsule TAKE 1 CAPSULE BY MOUTH EVERY DAY  . ezetimibe (ZETIA) 10 MG tablet Take 10 mg by mouth daily.    . famotidine (PEPCID) 20 MG tablet Take 20 mg by mouth 2 (two) times daily.  . fenofibrate 160 MG tablet Take 160 mg by mouth daily.  . fluticasone (FLONASE) 50 MCG/ACT nasal spray Place 2 sprays into both nostrils daily as needed for allergies or rhinitis.  Marland Kitchen gabapentin (NEURONTIN) 300 MG capsule Take 300 mg by mouth every evening.   Marland Kitchen losartan (COZAAR) 100 MG tablet Take 100 mg by mouth every evening.   . Magnesium Gluconate 550 MG TABS Take 250 mg by mouth daily.  . metoprolol tartrate (LOPRESSOR) 25 MG tablet TAKE 1 TABLET BY MOUTH TWICE A DAY  . nitroGLYCERIN (NITROSTAT) 0.4 MG SL tablet Place 1 tablet (0.4 mg total) under the tongue every 5 (five) minutes as needed for chest pain.  . Omega-3 Fatty Acids  (OMEGA-3 EPA FISH OIL PO) Take 3,210 mg by mouth 2 (two) times daily.  . traZODone (DESYREL) 50 MG tablet Take 50 mg by mouth at bedtime.     Allergies:   Codeine; Atorvastatin; Crestor [rosuvastatin calcium]; Meloxicam; and Statins   Social History   Tobacco Use  . Smoking status: Former Smoker    Types: Cigarettes    Last attempt to quit: 05/30/1989    Years since quitting: 29.3  . Smokeless tobacco: Never Used  Substance Use Topics  . Alcohol use: Yes    Alcohol/week: 0.0 standard drinks    Comment: one drink a month  . Drug use: No     Family Hx: The patient's family history includes Arthritis in his mother; COPD in his brother and father; Cancer in his brother and brother; Lung cancer in his father; Varicose Veins in his mother.  ROS:   Please see the history of present illness.    General:no colds or fevers, no weight changes, some allergy symptoms Skin:no rashes or ulcers HEENT:no blurred vision, no congestion CV:see HPI PUL:see HPI GI:no diarrhea constipation or melena, no indigestion GU:no hematuria, no dysuria MS:no joint pain, no claudication Neuro:no syncope, no lightheadedness Endo:no diabetes, no thyroid disease  All other systems reviewed and are negative.   Prior CV studies:   The following studies were reviewed today:  TEE with surgery 03/02/17  Result status: Final result   Aortic valve: The valve is trileaflet. No stenosis. No regurgitation.  Septum: No Patent Foramen Ovale present.  Left atrium: Patent foramen ovale not present.  Right ventricle: Normal cavity size, wall thickness and ejection fraction.  Tricuspid valve: Trace regurgitation.  Pulmonic valve: Trace regurgitation.  Left atrium: No spontaneous echo contrast.  Mitral valve: No leaflet thickening and calcification present. Trace regurgitation.  Left ventricle: Normal cavity size. The LV cavity was of normal diameter and measured 4.4 cm at end-diastole at the mid-papillary  level in the transgastric short-axis view. There was normal LV systolic function. There were no regional wall motion abnormalities. The ejection fraction was 65% by the Simpson's four and two chamber methods. The LV wall thickness was mildly increased and measured 0.96 cm at the posterior wall and 1.05 cm at the anterior wall. In the post-bypass  study, there was nor     Carotid dopplers 04/19/18 Summary: Right Carotid: Velocities in the right ICA are consistent with a 40-59%                stenosis.  Left Carotid: Velocities in the left ICA are consistent with a 1-39% stenosis.  Vertebrals:  Bilateral vertebral arteries demonstrate antegrade flow. Subclavians: Right subclavian artery was stenotic. Normal flow hemodynamics were seen in the left subclavian artery. Followed by Vascular.   Labs/Other Tests and Data Reviewed:    EKG:  An ECG dated 10/26/17 was personally reviewed today and demonstrated:  SB at 47 otherwise normal EKG  Recent Labs: No results found for requested labs within last 8760 hours.   Recent Lipid Panel Lab Results  Component Value Date/Time   CHOL 231 (H) 08/02/2017 07:45 AM   TRIG 178 (H) 08/02/2017 07:45 AM   HDL 50 08/02/2017 07:45 AM   CHOLHDL 4.6 08/02/2017 07:45 AM   LDLCALC 145 (H) 08/02/2017 07:45 AM    Wt Readings from Last 3 Encounters:  09/14/18 210 lb (95.3 kg)  05/25/18 213 lb (96.6 kg)  04/19/18 209 lb (94.8 kg)     Objective:    Vital Signs:  BP (!) 147/66   Pulse (!) 50   Ht 6\' 2"  (1.88 m)   Wt 210 lb (95.3 kg)   BMI 26.96 kg/m    VITAL SIGNS:  reviewed GEN:  no acute distress  Skin color normal Lungs no wheezing with talking and able to complete sentences without SOb Neuro alert and oriented X 3  Psych pleasant normal affect   ASSESSMENT & PLAN:    1. CAD with hx of stents then CABG in 2018.  No angina and has been doing well  Follow up in 4 months with Dr. Elease HashimotoNahser 2. HLD intolerant to statins, now in research study. 3. HTN  stable  4. Sinus brady asymptomatic discussed if increased fatigue would decrease meds.  On both BB and Dilt.  No further a fib since post op. 5. PAD with lower ext and carotid followed by vascular.  COVID-19 Education: The signs and symptoms of COVID-19 were discussed with the patient and how to seek care for testing (follow up with PCP or arrange E-visit).  The importance of social distancing was discussed today.  Time:   Today, I have spent 10 minutes with the patient with telehealth technology discussing the above problems.     Medication Adjustments/Labs and Tests Ordered: Current medicines are reviewed at length with the patient today.  Concerns regarding medicines are outlined above.   Tests Ordered: No orders of the defined types were placed in this encounter.   Medication Changes: No orders of the defined types were placed in this encounter.   Disposition:  Follow up in 4 month(s)  Signed, Nada BoozerLaura Ingold, NP  09/14/2018 11:24 AM    [facility]

## 2018-09-14 ENCOUNTER — Other Ambulatory Visit: Payer: Self-pay

## 2018-09-14 ENCOUNTER — Telehealth (INDEPENDENT_AMBULATORY_CARE_PROVIDER_SITE_OTHER): Payer: BLUE CROSS/BLUE SHIELD | Admitting: Cardiology

## 2018-09-14 ENCOUNTER — Encounter: Payer: Self-pay | Admitting: Cardiology

## 2018-09-14 VITALS — BP 147/66 | HR 50 | Ht 74.0 in | Wt 210.0 lb

## 2018-09-14 DIAGNOSIS — I779 Disorder of arteries and arterioles, unspecified: Secondary | ICD-10-CM

## 2018-09-14 DIAGNOSIS — I2510 Atherosclerotic heart disease of native coronary artery without angina pectoris: Secondary | ICD-10-CM

## 2018-09-14 DIAGNOSIS — I10 Essential (primary) hypertension: Secondary | ICD-10-CM

## 2018-09-14 DIAGNOSIS — E785 Hyperlipidemia, unspecified: Secondary | ICD-10-CM

## 2018-09-14 DIAGNOSIS — R001 Bradycardia, unspecified: Secondary | ICD-10-CM | POA: Diagnosis not present

## 2018-09-14 DIAGNOSIS — Z006 Encounter for examination for normal comparison and control in clinical research program: Secondary | ICD-10-CM

## 2018-09-14 DIAGNOSIS — I739 Peripheral vascular disease, unspecified: Secondary | ICD-10-CM | POA: Diagnosis not present

## 2018-09-14 DIAGNOSIS — Z955 Presence of coronary angioplasty implant and graft: Secondary | ICD-10-CM

## 2018-09-14 DIAGNOSIS — Z951 Presence of aortocoronary bypass graft: Secondary | ICD-10-CM

## 2018-09-14 NOTE — Patient Instructions (Addendum)
Medication Instructions:  Your physician recommends that you continue on your current medications as directed. Please refer to the Current Medication list given to you today.  If you need a refill on your cardiac medications before your next appointment, please call your pharmacy.   Lab work: None ordered  If you have labs (blood work) drawn today and your tests are completely normal, you will receive your results only by: Marland Kitchen MyChart Message (if you have MyChart) OR . A paper copy in the mail If you have any lab test that is abnormal or we need to change your treatment, we will call you to review the results.  Testing/Procedures: None ordered  Follow-Up: At [facility], you and your health needs are our priority.  As part of our continuing mission to provide you with exceptional heart care, we have created designated Provider Care Teams.  These Care Teams include your primary Cardiologist (physician) and Advanced Practice Providers (APPs -  Physician Assistants and Nurse Practitioners) who all work together to provide you with the care you need, when you need it. You will need a follow up appointment in:  4 months.  Please call our office 2 months in advance to schedule this appointment.  You may see Kristeen Miss, MD or one of the following Advanced Practice Providers on your designated Care Team: Tereso Newcomer, PA-C [place], [place] . Berton Bon, NP  Any Other Special Instructions Will Be Listed Below (If Applicable).

## 2018-09-15 ENCOUNTER — Other Ambulatory Visit: Payer: Self-pay | Admitting: Cardiovascular Disease

## 2018-10-29 ENCOUNTER — Telehealth: Payer: Self-pay | Admitting: *Deleted

## 2018-10-29 NOTE — Telephone Encounter (Signed)
   [facility] Pre-operative Risk Assessment    Request for surgical clearance:  1. What type of surgery is being performed? RIGHT KNEE SURGERY  2. When is this surgery scheduled? TBD  3. What type of clearance is required (medical clearance vs. Pharmacy clearance to hold med vs. Both)? MEDICAL  4. Are there any medications that need to be held prior to surgery and how long? ASA   5. Practice name and name of physician performing surgery? [facility]; DR. Mitzi Hansen [place]  6. What is your office phone number [phone]   7.   What is your office fax number [phone]  8.   Anesthesia type (None, local, MAC, general) ? GENERAL?   Derrick [name] 10/29/2018, 4:35 PM  _________________________________________________________________   (provider comments below)

## 2018-10-30 ENCOUNTER — Ambulatory Visit
Admission: RE | Admit: 2018-10-30 | Discharge: 2018-10-30 | Disposition: A | Discharge status: Home / Self Care | Payer: BLUE CROSS/BLUE SHIELD | Source: Ambulatory Visit | Attending: Urology | Admitting: Urology

## 2018-10-30 DIAGNOSIS — N281 Cyst of kidney, acquired: Secondary | ICD-10-CM

## 2018-10-30 NOTE — Telephone Encounter (Signed)
   Primary Cardiologist: Kristeen Miss, MD  Chart reviewed as part of pre-operative protocol coverage. Patient was contacted 10/30/2018 in reference to pre-operative risk assessment for pending surgery as outlined below.  Derrick [name] was last seen on 09/14/18 by Nada Boozer via telemedicine. The patient has hx of CAD s/p CABG, PAD, post-op afib, HLD, dyslipidemia, GERD, HTN, OA, renal insufficiency by labs, sinus bradycardia, normal LVEF. Revised cardiac risk index is 0.9% indicating low risk of CV complications. The patient affirms he's been doing well from a cardiac standpoint without any angina, dyspnea, palpitations or syncope. He is able to exert himself without cardiac symptoms. Therefore, based on ACC/AHA guidelines, the patient would be at acceptable risk for the planned procedure without further cardiovascular testing.   Will route to Dr. Elease Hashimoto for input on how long to hold ASA for knee surgery. Pt does note by the way he also recently held ASA for back injection (instructed by the injection team, no notes came through to preop box about this). Dr. Elease Hashimoto - - Please route response to P CV DIV PREOP (the pre-op pool). Thank you.   Laurann Montana, PA-C 10/30/2018, 10:04 AM

## 2018-10-30 NOTE — Telephone Encounter (Signed)
Pt may hold ASA for 5-7 days if needed for knee surgery

## 2018-10-30 NOTE — Telephone Encounter (Signed)
   Primary Cardiologist:Philip Nahser, MD  Chart reviewed as part of pre-operative protocol coverage. To summarize recommendations:  - Based on ACC/AHA guidelines, Derrick [name] would be at acceptable risk for the planned procedure without further cardiovascular testing.   - Per Dr. Elease Hashimoto, Pt may hold ASA for 5-7 days if needed for knee surgery    Will route this bundled recommendation to requesting provider via Epic fax function. Please call with questions.  Laurann Montana, PA-C 10/30/2018, 4:46 PM

## 2018-11-22 ENCOUNTER — Telehealth: Payer: Self-pay | Admitting: *Deleted

## 2018-11-22 NOTE — Telephone Encounter (Signed)
Spoke with patient for visit [phone] in the [employer] research study.  AE to report of meniscus repair, no saes.  The subject gave a verbal consent to have meds delivered to his home.  He was not coming to the clinic. Will reschedule for lab work as soon as all clinic visits are resumed.

## 2019-01-02 ENCOUNTER — Ambulatory Visit: Payer: BC Managed Care – PPO | Admitting: Podiatry

## 2019-01-02 ENCOUNTER — Ambulatory Visit (INDEPENDENT_AMBULATORY_CARE_PROVIDER_SITE_OTHER): Payer: BC Managed Care – PPO

## 2019-01-02 ENCOUNTER — Other Ambulatory Visit: Payer: Self-pay

## 2019-01-02 ENCOUNTER — Encounter: Payer: Self-pay | Admitting: Podiatry

## 2019-01-02 ENCOUNTER — Other Ambulatory Visit: Payer: Self-pay | Admitting: Podiatry

## 2019-01-02 VITALS — Temp 98.2°F

## 2019-01-02 DIAGNOSIS — M779 Enthesopathy, unspecified: Secondary | ICD-10-CM

## 2019-01-02 DIAGNOSIS — G629 Polyneuropathy, unspecified: Secondary | ICD-10-CM | POA: Diagnosis not present

## 2019-01-02 DIAGNOSIS — R2681 Unsteadiness on feet: Secondary | ICD-10-CM

## 2019-01-02 DIAGNOSIS — M79671 Pain in right foot: Secondary | ICD-10-CM

## 2019-01-02 DIAGNOSIS — H6123 Impacted cerumen, bilateral: Secondary | ICD-10-CM | POA: Insufficient documentation

## 2019-01-02 NOTE — Progress Notes (Signed)
Subjective:   Patient ID: Derrick [name], male   DOB: 63 y.o.   MRN: [id number]   HPI Patient presents with shooting pain on the top of the right foot and a history of instability with gait with collapse occurring right over left.  He does have significant neuropathy and it has gotten worse gradually over the last few years and is tried gabapentin without success   ROS      Objective:  Physical Exam  Neurovascular status intact with patient found to have quite a bit of discomfort in the dorsal tendon complex right with inflammation fluid of the extensor complex with moderate gated instability noted with neuropathy which is contributing factor to this     Assessment:  Extensor tendinitis right with inflammatory complex bilateral and neuropathy with gait instability right over left     Plan:  H&P discussed all conditions and I have recommended injection right which was done and discussed arthritis and the type of foot structure he has along with neuropathy and I recommended balance bracing to try to provide for more stability and prevent falls and buckling of his foot and ankle.  He is scheduled with ped orthotist for balance bracing  X-rays indicate quite a bit of dorsal spurring of the midfoot right with significant changes of the hallux also noted

## 2019-01-07 ENCOUNTER — Other Ambulatory Visit: Payer: Self-pay

## 2019-01-07 ENCOUNTER — Ambulatory Visit: Payer: BC Managed Care – PPO | Admitting: Orthotics

## 2019-01-07 DIAGNOSIS — M79671 Pain in right foot: Secondary | ICD-10-CM

## 2019-01-07 DIAGNOSIS — M779 Enthesopathy, unspecified: Secondary | ICD-10-CM

## 2019-01-07 DIAGNOSIS — G629 Polyneuropathy, unspecified: Secondary | ICD-10-CM

## 2019-01-07 DIAGNOSIS — R2681 Unsteadiness on feet: Secondary | ICD-10-CM

## 2019-01-07 NOTE — Progress Notes (Signed)
Patient came in today for Eval/assessment for [place].  Patient presents with a history of falling, and also fearful of falling.   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  He will do well with b/l balance bracing

## 2019-01-14 NOTE — Progress Notes (Signed)
Cardiology Office Note:    Date:  01/15/2019   ID:  Derrick [name], DOB 03/16/1956, MRN [id number]  PCP:  Soundra PilonBrake, Andrew R, [name]  Cardiologist:  Kristeen MissPhilip Nahser, MD  Electrophysiologist:  None  VVS: Dr. Darrick PennaFields  Referring MD: Iva BoopVia, Kevin, MD   Chief Complaint  Patient presents with  . Follow-up    CAD    History of Present Illness:    Derrick AllegraSteven R Gent is a 63 y.o. male with:  Coronary artery disease   Hx PCI to LAD and LCx  S/p CABG in 2018  Post op atrial fibrillation   PAD  S/p L Fem-Post Tib bypass 2016  S/p R Fem-Pop 2016  S/p bilat CIA stents in 2016  Hyperlipidemia   Hypertension   Sinus brady   [name] was last seen in 08/2018 by Nada BoozerLaura Ingold, NP.  He returns for follow-up.  He is here alone.  Since last seen, he has not had any chest discomfort.  He has not had significant shortness of breath.  He has not had orthopnea, syncope.  He has occasional left leg swelling.  He recently had arthroscopic knee surgery on the right.  He works at [employer].  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  They now have adequate social distancing arranged.  Prior CV studies:   The following studies were reviewed today:  Carotid US 04/19/18 Summary: Right Carotid: Velocities in the right ICA are consistent with a 40-59%                stenosis. Left Carotid: Velocities in the left ICA are consistent with a 1-39% stenosis. Vertebrals:  Bilateral vertebral arteries demonstrate antegrade flow. Subclavians: Right subclavian artery was stenotic. Normal flow hemodynamics were              seen in the left subclavian artery.  Cardiac catheterization 03/01/17 1. Severe triple vessel CAD 2. Severe ostial left main stenosis with dampening of pressures with catheter engagement.  3. Patent stent mid LAD 4. Patent stent mid Circumflex with moderate disease in the proximal Circumflex prior to the stent.  5. Chronic occlusion RCA with filling of  distal branches by left to right collaterals.  6. Normal LV systolic function  Recommendations: He has high grade stenosis of the ostial left main artery and filling of the RCA from left to right collaterals. CABG is the best method for revascularization given his anatomy. Will consult CT surgery.     Past Medical History:  Diagnosis Date  . Coronary artery disease 2006   stents x2=LAD LCX; totally occluded RCA b. CABG 03/02/17 x2 (left internal mammary artery to LAD, Y-graft right mammary from left mammary to OM).  . Dyslipidemia   . GERD (gastroesophageal reflux disease)   . History of bronchitis   . Hyperlipidemia    statin intolerant  . Hyperparathyroidism   . Hypertension   . Insomnia   . Metatarsalgia   . OA (osteoarthritis)   . Peripheral vascular disease (HCC)   . Seasonal allergies   . Urinary frequency   . Wears glasses    Surgical Hx: The patient  has a past surgical history that includes Cervical spine surgery (2004); Cardiac catheterization (10/28/2004); Cervical fusion (2130,8657(2007,2009); Incision and drainage abscess posterior cervica lspine (2009); Elbow Arthroplasty (1994); Colonoscopy; Shoulder open rotator cuff repair (Right, 04/23/2013); Cardiac catheterization (N/A, 01/16/2015); stents placed  (2006); Coronary angioplasty (2006); Femoral-popliteal Bypass Graft (Left, 03/17/2015); Endarterectomy femoral (Left,  03/17/2015); Patch angioplasty (Left, 03/17/2015); Intraoperative arteriogram (Left, 03/17/2015); Femoral-popliteal Bypass Graft (Right, 05/11/2015); Vein harvest (Right, 05/11/2015); Intraoperative arteriogram (Right, 05/11/2015); LEFT HEART CATH AND CORONARY ANGIOGRAPHY (N/A, 03/01/2017); Coronary artery bypass graft (N/A, 03/02/2017); and TEE without cardioversion (N/A, 03/02/2017).   Current Medications: Current Meds  Medication Sig  . acetaminophen (TYLENOL) 650 MG CR tablet Take 650 mg by mouth 2 (two) times daily as needed for pain.   Marland Kitchen. aspirin EC 81 MG tablet  Take 1 tablet (81 mg total) by mouth daily.  . Bempedoic Acid 180 MG TABS Take 180 mg by mouth daily. Use as directed - for a 3-year period  . calcitRIOL (ROCALTROL) 0.5 MCG capsule Take 0.5 mcg by mouth daily.  . calcium carbonate (OSCAL) 1500 (600 Ca) MG TABS tablet Take 600 mg of elemental calcium 2 (two) times daily with a meal by mouth. Patient takes 2 in the morning and 1 in the evening.  . Cyanocobalamin (VITAMIN B-12) 2500 MCG SUBL Place 2,500 mcg under the tongue daily.  Marland Kitchen. diltiazem (CARDIZEM CD) 120 MG 24 hr capsule TAKE 1 CAPSULE BY MOUTH EVERY DAY  . ezetimibe (ZETIA) 10 MG tablet Take 10 mg by mouth daily.    . famotidine (PEPCID) 20 MG tablet Take 20 mg by mouth 2 (two) times daily.  . fenofibrate 160 MG tablet Take 160 mg by mouth daily.  Marland Kitchen. losartan (COZAAR) 100 MG tablet Take 100 mg by mouth every evening.   . Magnesium Gluconate 550 MG TABS Take 250 mg by mouth daily.  . metoprolol tartrate (LOPRESSOR) 25 MG tablet TAKE 1 TABLET BY MOUTH TWICE A DAY  . nitroGLYCERIN (NITROSTAT) 0.4 MG SL tablet Place 1 tablet (0.4 mg total) under the tongue every 5 (five) minutes as needed for chest pain.  . Omega-3 Fatty Acids (OMEGA-3 EPA FISH OIL PO) Take 3,210 mg by mouth 2 (two) times daily.  . traZODone (DESYREL) 50 MG tablet Take 50 mg by mouth at bedtime.  . [DISCONTINUED] diltiazem (CARDIZEM CD) 120 MG 24 hr capsule TAKE 1 CAPSULE BY MOUTH EVERY DAY     Allergies:   Codeine, Atorvastatin, Crestor [rosuvastatin calcium], Meloxicam, and Statins   Social History   Tobacco Use  . Smoking status: Former Smoker    Types: Cigarettes    Quit date: 05/30/1989    Years since quitting: 29.6  . Smokeless tobacco: Never Used  Substance Use Topics  . Alcohol use: Yes    Alcohol/week: 0.0 standard drinks    Comment: one drink a month  . Drug use: No     Family Hx: The patient's family history includes Arthritis in his mother; COPD in his brother and father; Cancer in his brother and  brother; Lung cancer in his father; Varicose Veins in his mother.  ROS:   Please see the history of present illness.    ROS All other systems reviewed and are negative.   EKGs/Labs/Other Test Reviewed:    EKG:  EKG is  ordered today.  The ekg ordered today demonstrates normal sinus rhythm, heart rate 63, normal axis, QTC 440, no change from prior tracing  Recent Labs: No results found for requested labs within last 8760 hours.   Recent Lipid Panel Lab Results  Component Value Date/Time   CHOL 231 (H) 08/02/2017 07:45 AM   TRIG 178 (H) 08/02/2017 07:45 AM   HDL 50 08/02/2017 07:45 AM   CHOLHDL 4.6 08/02/2017 07:45 AM   LDLCALC 145 (H) 08/02/2017 07:45 AM  Physical Exam:    VS:  BP (!) 144/70   Pulse 63   Ht 6\' 2"  (1.88 m)   Wt 211 lb (95.7 kg)   SpO2 100%   BMI 27.09 kg/m     Wt Readings from Last 3 Encounters:  01/15/19 211 lb (95.7 kg)  09/14/18 210 lb (95.3 kg)  05/25/18 213 lb (96.6 kg)     Physical Exam  Constitutional: He is oriented to person, place, and time. He appears well-developed and well-nourished. No distress.  HENT:  Head: Normocephalic and atraumatic.  Eyes: No scleral icterus.  Neck: No JVD present. No thyromegaly present.  Cardiovascular: Normal rate, regular rhythm and normal heart sounds.  No murmur heard. Pulmonary/Chest: Effort normal and breath sounds normal. He has no rales.  Abdominal: Soft. There is no hepatomegaly.  Musculoskeletal:        General: No edema.  Lymphadenopathy:    He has no cervical adenopathy.  Neurological: He is alert and oriented to person, place, and time.  Skin: Skin is warm and dry.  Psychiatric: He has a normal mood and affect.    ASSESSMENT & PLAN:    1. Coronary artery disease involving native coronary artery of native heart without angina pectoris History of prior stenting to the LAD and LCx and subsequent CABG in 2018.  He is doing well without anginal symptoms.  Continue aspirin.  He is intolerant  of statins.  He is currently enrolled in the CLEAR trial and is on Bempedoic Acid.  2. Bilateral carotid artery disease, unspecified type ([place]) Carotid US in 11/19 with 40-59% right ICA stenosis and non-39% left ICA stenosis.  We will need to repeat carotid US in November 2020.  Continue aspirin.  3. PAOD (peripheral arterial occlusive disease) ([place]) He is followed by vascular surgery.  He denies claudication symptoms.  4. Essential hypertension Fair control.  I have asked him to continue to monitor his blood pressure.  If his pressure remains >140/90, consider adding HCTZ to his medical regimen.  5. Hyperlipidemia LDL goal <70 Continue follow up in the CLEAR trial.     Dispo:  Return in about 6 months (around 07/18/2019) for Routine Follow Up, w/ Dr. Acie Fredrickson.   Medication Adjustments/Labs and Tests Ordered: Current medicines are reviewed at length with the patient today.  Concerns regarding medicines are outlined above.  Tests Ordered: Orders Placed This Encounter  Procedures  . EKG 12-Lead   Medication Changes: Meds ordered this encounter  Medications  . diltiazem (CARDIZEM CD) 120 MG 24 hr capsule    Sig: TAKE 1 CAPSULE BY MOUTH EVERY DAY    Dispense:  90 capsule    Refill:  3    Signed, Richardson Dopp, PA-C  01/15/2019 4:56 PM    [facility] [place], [place], [place]  [address] Phone: [phone]; Fax: [phone]

## 2019-01-15 ENCOUNTER — Other Ambulatory Visit: Payer: Self-pay

## 2019-01-15 ENCOUNTER — Encounter: Payer: Self-pay | Admitting: Physician Assistant

## 2019-01-15 ENCOUNTER — Ambulatory Visit: Payer: BC Managed Care – PPO | Admitting: Physician Assistant

## 2019-01-15 VITALS — BP 144/70 | HR 63 | Ht 74.0 in | Wt 211.0 lb

## 2019-01-15 DIAGNOSIS — E785 Hyperlipidemia, unspecified: Secondary | ICD-10-CM

## 2019-01-15 DIAGNOSIS — I10 Essential (primary) hypertension: Secondary | ICD-10-CM

## 2019-01-15 DIAGNOSIS — I739 Peripheral vascular disease, unspecified: Secondary | ICD-10-CM

## 2019-01-15 DIAGNOSIS — I779 Disorder of arteries and arterioles, unspecified: Secondary | ICD-10-CM | POA: Diagnosis not present

## 2019-01-15 DIAGNOSIS — I2510 Atherosclerotic heart disease of native coronary artery without angina pectoris: Secondary | ICD-10-CM | POA: Diagnosis not present

## 2019-01-15 MED ORDER — DILTIAZEM HCL ER COATED BEADS 120 MG PO CP24: ORAL_CAPSULE | ORAL | 3 refills | Status: DC

## 2019-01-15 NOTE — Patient Instructions (Addendum)

## 2019-01-18 ENCOUNTER — Telehealth: Payer: Self-pay | Admitting: Podiatry

## 2019-01-18 NOTE — Telephone Encounter (Signed)
Called pt with benefits for the braces and they are valid and billable codes and no auth required. Pt has met out of pocket max so they should be covered @ 100%. Pt is aware and is going to discuss with wife and get back to [place].

## 2019-01-18 NOTE — Telephone Encounter (Signed)
Pt called back and has decided he wants to proceed with the braces and is scheduled to see Liliane Channel.

## 2019-01-31 ENCOUNTER — Other Ambulatory Visit: Payer: BC Managed Care – PPO | Admitting: Orthotics

## 2019-01-31 ENCOUNTER — Other Ambulatory Visit: Payer: Self-pay

## 2019-02-27 ENCOUNTER — Ambulatory Visit (INDEPENDENT_AMBULATORY_CARE_PROVIDER_SITE_OTHER): Payer: BC Managed Care – PPO | Admitting: Orthotics

## 2019-02-27 ENCOUNTER — Other Ambulatory Visit: Payer: Self-pay

## 2019-02-27 DIAGNOSIS — M779 Enthesopathy, unspecified: Secondary | ICD-10-CM

## 2019-02-27 DIAGNOSIS — R2681 Unsteadiness on feet: Secondary | ICD-10-CM

## 2019-02-27 DIAGNOSIS — M79671 Pain in right foot: Secondary | ICD-10-CM | POA: Diagnosis not present

## 2019-02-27 DIAGNOSIS — G629 Polyneuropathy, unspecified: Secondary | ICD-10-CM

## 2019-02-28 NOTE — Progress Notes (Signed)
Patient came in today to pick up Moore Balance Brace (Bilateral).   Patient was able to don brace independently and brace was check for fit to custom.   Patient was observed walking with brace and gait was improved as well as stability.   Patient was advised of care and wearing instructions.  Advised to notify practice if there were any issues; especially skin irritation.  

## 2019-03-17 ENCOUNTER — Other Ambulatory Visit: Payer: Self-pay | Admitting: Cardiovascular Disease

## 2019-03-19 ENCOUNTER — Telehealth: Payer: Self-pay | Admitting: *Deleted

## 2019-03-19 DIAGNOSIS — Z006 Encounter for examination for normal comparison and control in clinical research program: Secondary | ICD-10-CM

## 2019-03-19 NOTE — Telephone Encounter (Signed)
Patient contacted for visit [id number] in the clear research study.  Chart check completed also.  Next clinic visit scheduled.

## 2019-04-10 ENCOUNTER — Other Ambulatory Visit: Payer: Self-pay | Admitting: Cardiovascular Disease

## 2019-04-22 ENCOUNTER — Ambulatory Visit (HOSPITAL_COMMUNITY)
Admission: RE | Admit: 2019-04-22 | Discharge: 2019-04-22 | Disposition: A | Discharge status: Home / Self Care | Payer: BC Managed Care – PPO | Source: Ambulatory Visit | Attending: Cardiology | Admitting: Cardiology

## 2019-04-22 ENCOUNTER — Other Ambulatory Visit: Payer: Self-pay

## 2019-04-22 DIAGNOSIS — I6523 Occlusion and stenosis of bilateral carotid arteries: Secondary | ICD-10-CM | POA: Diagnosis present

## 2019-04-22 DIAGNOSIS — R0989 Other specified symptoms and signs involving the circulatory and respiratory systems: Secondary | ICD-10-CM | POA: Insufficient documentation

## 2019-04-24 ENCOUNTER — Other Ambulatory Visit: Payer: Self-pay

## 2019-04-24 DIAGNOSIS — I6523 Occlusion and stenosis of bilateral carotid arteries: Secondary | ICD-10-CM

## 2019-04-24 DIAGNOSIS — I779 Disorder of arteries and arterioles, unspecified: Secondary | ICD-10-CM

## 2019-04-29 ENCOUNTER — Other Ambulatory Visit: Payer: Self-pay

## 2019-04-29 ENCOUNTER — Ambulatory Visit (INDEPENDENT_AMBULATORY_CARE_PROVIDER_SITE_OTHER)
Admission: RE | Admit: 2019-04-29 | Discharge: 2019-04-29 | Disposition: A | Discharge status: Home / Self Care | Payer: BC Managed Care – PPO | Source: Ambulatory Visit | Attending: Family | Admitting: Family

## 2019-04-29 ENCOUNTER — Ambulatory Visit (HOSPITAL_COMMUNITY)
Admission: RE | Admit: 2019-04-29 | Discharge: 2019-04-29 | Disposition: A | Discharge status: Home / Self Care | Payer: BC Managed Care – PPO | Source: Ambulatory Visit | Attending: Family | Admitting: Family

## 2019-04-29 DIAGNOSIS — I779 Disorder of arteries and arterioles, unspecified: Secondary | ICD-10-CM

## 2019-05-01 ENCOUNTER — Encounter: Payer: Self-pay | Admitting: Family

## 2019-05-01 ENCOUNTER — Inpatient Hospital Stay (HOSPITAL_COMMUNITY): Admission: RE | Admit: 2019-05-01 | Payer: BC Managed Care – PPO | Source: Ambulatory Visit

## 2019-05-01 ENCOUNTER — Ambulatory Visit: Payer: BC Managed Care – PPO | Admitting: Family

## 2019-05-01 ENCOUNTER — Other Ambulatory Visit: Payer: Self-pay

## 2019-05-01 VITALS — BP 145/64 | HR 90 | Temp 97.7°F | Resp 14 | Ht 72.0 in | Wt 214.0 lb

## 2019-05-01 DIAGNOSIS — I6523 Occlusion and stenosis of bilateral carotid arteries: Secondary | ICD-10-CM

## 2019-05-01 DIAGNOSIS — Z95828 Presence of other vascular implants and grafts: Secondary | ICD-10-CM

## 2019-05-01 DIAGNOSIS — Z87891 Personal history of nicotine dependence: Secondary | ICD-10-CM

## 2019-05-01 DIAGNOSIS — I779 Disorder of arteries and arterioles, unspecified: Secondary | ICD-10-CM

## 2019-05-01 NOTE — Progress Notes (Signed)
[facility] [place] HISTORY AND PHYSICAL   CC: Follow up peripheral artery occlusive disease and extracranial carotid artery stenosis    History of Present Illness:   Derrick [name] is a 63 y.o. male who is s/pleft femoral to posterior tibial bypass and a sequential graft to the below-knee popliteal artery on March 17, 2015. He subsequently underwent right femoral to below-knee popliteal bypass using saphenous vein on December 12, 2016by Dr. Darrick Penna. He is also s/p bilateral CIA stents placed on 01-16-15.  He presents today for follow-up. Hedenies any claudication symptoms with walking. He finished cardiac rehab.   He had a CABG on 03-02-17, several coronary artery stents placed prior to this.   He denies any hx of stroke or TIA.  He had right knee surgery for torn meniscus, June 2020.   He does a lot of walking at his job.   Diabetic:No Tobacco ZOX:WRUEAV smoker, quitin 1991, started in his teens  Pt meds include: Statin :No, causes myalgias. He is in a study for a medication vs placebo Betablocker:Yes ASA:Yes, 81 mg Other anticoagulants/antiplatelets:no   Current Outpatient Medications  Medication Sig Dispense Refill  . acetaminophen (TYLENOL) 650 MG CR tablet Take 650 mg by mouth 2 (two) times daily as needed for pain.     Marland Kitchen aspirin EC 81 MG tablet Take 1 tablet (81 mg total) by mouth daily.    . Bempedoic Acid 180 MG TABS Take 180 mg by mouth daily. Use as directed - for a 3-year period    . calcitRIOL (ROCALTROL) 0.5 MCG capsule Take 0.5 mcg by mouth daily.    . calcium carbonate (OSCAL) 1500 (600 Ca) MG TABS tablet Take 600 mg of elemental calcium 2 (two) times daily with a meal by mouth. Patient takes 2 in the morning and 1 in the evening.    . Cyanocobalamin (VITAMIN B-12) 2500 MCG SUBL Place 2,500 mcg under the tongue daily.    Marland Kitchen diltiazem (CARDIZEM CD) 120 MG 24 hr capsule TAKE 1 CAPSULE BY MOUTH EVERY DAY 90 capsule 2  .  ezetimibe (ZETIA) 10 MG tablet Take 10 mg by mouth daily.      . famotidine (PEPCID) 20 MG tablet Take 20 mg by mouth 2 (two) times daily.    . fenofibrate 160 MG tablet Take 160 mg by mouth daily.    Marland Kitchen losartan (COZAAR) 100 MG tablet Take 100 mg by mouth every evening.     . Magnesium Gluconate 550 MG TABS Take 250 mg by mouth daily.    . metoprolol tartrate (LOPRESSOR) 25 MG tablet TAKE 1 TABLET BY MOUTH TWICE A DAY 180 tablet 3  . nitroGLYCERIN (NITROSTAT) 0.4 MG SL tablet Place 1 tablet (0.4 mg total) under the tongue every 5 (five) minutes as needed for chest pain. 25 tablet 5  . Omega-3 Fatty Acids (OMEGA-3 EPA FISH OIL PO) Take 3,210 mg by mouth 2 (two) times daily.    . traZODone (DESYREL) 50 MG tablet Take 50 mg by mouth at bedtime.     No current facility-administered medications for this visit.     Past Medical History:  Diagnosis Date  . Coronary artery disease 2006   stents x2=LAD LCX; totally occluded RCA b. CABG 03/02/17 x2 (left internal mammary artery to LAD, Y-graft right mammary from left mammary to OM).  . Dyslipidemia   . GERD (gastroesophageal reflux disease)   . History of bronchitis   . Hyperlipidemia    statin intolerant  .  Hyperparathyroidism   . Hypertension   . Insomnia   . Metatarsalgia   . OA (osteoarthritis)   . Peripheral vascular disease (HCC)   . Seasonal allergies   . Urinary frequency   . Wears glasses     Social History Social History   Tobacco Use  . Smoking status: Former Smoker    Types: Cigarettes    Quit date: 05/30/1989    Years since quitting: 29.9  . Smokeless tobacco: Never Used  Substance Use Topics  . Alcohol use: Yes    Alcohol/week: 0.0 standard drinks    Comment: one drink a month  . Drug use: No    Family History Family History  Problem Relation Age of Onset  . Arthritis Mother   . Varicose Veins Mother   . Lung cancer Father   . COPD Father        Lung  . Cancer Brother        Lukemia  . COPD Brother   .  Cancer Brother        Prostate    Surgical History Past Surgical History:  Procedure Laterality Date  . CARDIAC CATHETERIZATION  10/28/2004   circ and mid LAD chyper stents  . CERVICAL FUSION  [id number]  . CERVICAL SPINE SURGERY  2004  . COLONOSCOPY    . CORONARY ANGIOPLASTY  2006   stents placed   . CORONARY ARTERY BYPASS GRAFT N/A 03/02/2017   Procedure: CORONARY ARTERY BYPASS GRAFTING (CABG), ON PUMP, TIMES TWO, USING BILATERAL MAMMARY ARTERIES;  Surgeon: Loreli Slot, MD;  Location: [facility] OR;  Service: Open Heart Surgery;  Laterality: N/A;  . ELBOW ARTHROPLASTY  1994   right  . ENDARTERECTOMY FEMORAL Left 03/17/2015   Procedure: ENDARTERECTOMY COMMON FEMORAL WITH PROFUNDOPLASTY;  Surgeon: Sherren Kerns, MD;  Location: [facility] OR;  Service: Vascular;  Laterality: Left;  . FEMORAL-POPLITEAL BYPASS GRAFT Left 03/17/2015   Procedure: BYPASS GRAFT FEMORAL-POPLITEAL ARTERY-LEFT LEG AND POSTERIOR TIBIAL SEQUENTIAL GRAFT TO BELOW KNEE POPLITEAL ARTERY;  Surgeon: Sherren Kerns, MD;  Location: [facility] OR;  Service: Vascular;  Laterality: Left;  . FEMORAL-POPLITEAL BYPASS GRAFT Right 05/11/2015   Procedure:  RIGHT FEMORAL-BELOW THE KNEE POPLITEAL ARTERY BYPASS GRAFT USING NON REVERSE RIGHT GREATER SAPHENOUS VEIN;  Surgeon: Sherren Kerns, MD;  Location: [facility] OR;  Service: Vascular;  Laterality: Right;  . INCISION AND DRAINAGE ABSCESS POSTERIOR CERVICALSPINE  2009  . INTRAOPERATIVE ARTERIOGRAM Left 03/17/2015   Procedure: INTRA OPERATIVE ARTERIOGRAM X2;  Surgeon: Sherren Kerns, MD;  Location: [facility] OR;  Service: Vascular;  Laterality: Left;  . INTRAOPERATIVE ARTERIOGRAM Right 05/11/2015   Procedure: INTRA OPERATIVE ARTERIOGRAM;  Surgeon: Sherren Kerns, MD;  Location: [facility] OR;  Service: Vascular;  Laterality: Right;  . LEFT HEART CATH AND CORONARY ANGIOGRAPHY N/A 03/01/2017   Procedure: LEFT HEART CATH AND CORONARY ANGIOGRAPHY;  Surgeon: Kathleene Hazel, MD;  Location: [facility] INVASIVE CV LAB;   Service: Cardiovascular;  Laterality: N/A;  . PATCH ANGIOPLASTY Left 03/17/2015   Procedure: VEIN PATCH ANGIOPLASTY COMMON FEMORAL ARTERY;  Surgeon: Sherren Kerns, MD;  Location: [facility] OR;  Service: Vascular;  Laterality: Left;  . PERIPHERAL VASCULAR CATHETERIZATION N/A 01/16/2015   Procedure: Abdominal Aortogram;  Surgeon: Sherren Kerns, MD;  Location: [facility] INVASIVE CV LAB;  Service: Cardiovascular;  Laterality: N/A;  . SHOULDER OPEN ROTATOR CUFF REPAIR Right 04/23/2013   Procedure: RIGHT TWO TENDON ROTATOR CUFF REPAIR  SUBACROMIAL DECOMPRESSION AND OPEN DISTAL CLAVICLE RESECTION;  Surgeon: Wyn Forster.,  MD;  Location: [facility];  Service: Orthopedics;  Laterality: Right;  . stents placed   2006  . TEE WITHOUT CARDIOVERSION N/A 03/02/2017   Procedure: TRANSESOPHAGEAL ECHOCARDIOGRAM (TEE);  Surgeon: Loreli SlotHendrickson, Burdett C, MD;  [facility];  Service: Open Heart Surgery;  Laterality: N/A;  . VEIN HARVEST Right 05/11/2015   Procedure: WITH NON REVERSE RIGHT GREATER SAPHENOUS VEIN HARVEST;  Surgeon: Sherren Kernsharles E Fields, MD;  [facility];  Service: Vascular;  Laterality: Right;    Allergies  Allergen Reactions  . Codeine Itching  . Atorvastatin Other (See Comments)    Muscle aches Muscle aches  . Crestor [Rosuvastatin Calcium]     Muscle aches  . Meloxicam Other (See Comments)    Can not take due to kidneys  . Statins Other (See Comments)    myalgia    Current Outpatient Medications  Medication Sig Dispense Refill  . acetaminophen (TYLENOL) 650 MG CR tablet Take 650 mg by mouth 2 (two) times daily as needed for pain.     Marland Kitchen. aspirin EC 81 MG tablet Take 1 tablet (81 mg total) by mouth daily.    . Bempedoic Acid 180 MG TABS Take 180 mg by mouth daily. Use as directed - for a 3-year period    . calcitRIOL (ROCALTROL) 0.5 MCG capsule Take 0.5 mcg by mouth daily.    . calcium carbonate (OSCAL) 1500 (600 Ca) MG TABS tablet Take 600 mg of elemental calcium 2 (two)  times daily with a meal by mouth. Patient takes 2 in the morning and 1 in the evening.    . Cyanocobalamin (VITAMIN B-12) 2500 MCG SUBL Place 2,500 mcg under the tongue daily.    Marland Kitchen. diltiazem (CARDIZEM CD) 120 MG 24 hr capsule TAKE 1 CAPSULE BY MOUTH EVERY DAY 90 capsule 2  . ezetimibe (ZETIA) 10 MG tablet Take 10 mg by mouth daily.      . famotidine (PEPCID) 20 MG tablet Take 20 mg by mouth 2 (two) times daily.    . fenofibrate 160 MG tablet Take 160 mg by mouth daily.    Marland Kitchen. losartan (COZAAR) 100 MG tablet Take 100 mg by mouth every evening.     . Magnesium Gluconate 550 MG TABS Take 250 mg by mouth daily.    . metoprolol tartrate (LOPRESSOR) 25 MG tablet TAKE 1 TABLET BY MOUTH TWICE A DAY 180 tablet 3  . nitroGLYCERIN (NITROSTAT) 0.4 MG SL tablet Place 1 tablet (0.4 mg total) under the tongue every 5 (five) minutes as needed for chest pain. 25 tablet 5  . Omega-3 Fatty Acids (OMEGA-3 EPA FISH OIL PO) Take 3,210 mg by mouth 2 (two) times daily.    . traZODone (DESYREL) 50 MG tablet Take 50 mg by mouth at bedtime.     No current facility-administered medications for this visit.      REVIEW OF SYSTEMS: See HPI for pertinent positives and negatives.  Physical Examination Vitals:   05/01/19 1500  BP: (!) 145/64  Pulse: 90  Resp: 14  Temp: 97.7 F (36.5 C)  TempSrc: Temporal  SpO2: 100%  Weight: 214 lb (97.1 kg)  Height: 6' (1.829 m)   Body mass index is 29.02 kg/m.  General: WDWN male in NAD Gait: Normal HENT: no gross abnormalities  Eyes: Pupils equal and round Pulmonary: normal non-labored breathing, good air movement in all fields, CTAB, without rales, rhonchi,or wheezes Cardiac: RRR, no murmur detected Abdomen: soft, NT, no masses palpated Skin: no rashes, no ulcers,  no cellulitis.   VASCULAR EXAM  Carotid Bruits Right Left   Negative Negative      Radial pulses are 2+ palpable bilaterally   Adominal aortic pulse is not palpable                      VASCULAR  EXAM: Extremities without ischemic changes, without Gangrene; without open wounds.                                                                                                          LE Pulses Right Left       FEMORAL  2+ palpable  2+ palpable        POPLITEAL  1+ palpable   2+ palpable       POSTERIOR TIBIAL  2+ palpable   2+ palpable        DORSALIS PEDIS      ANTERIOR TIBIAL not palpable  2+ palpable     Musculoskeletal: no muscle wasting or atrophy; no peripheral edema  Neurologic:  A&O X 3; appropriate affect, sensation is normal; speech is normal, CN 2-12 intact, pain and light touch intact in extremities, motor exam as listed above. Psychiatric: Normal thought content, mood appropriate to clinical situation.    DATA  Carotid Duplex (04-22-19): Right Carotid: Velocities in the right ICA are consistent with a 1-39% stenosis.                Non-hemodynamically significant plaque <50% noted in the CCA. Left Carotid: Velocities in the left ICA are consistent with a 1-39% stenosis. Vertebrals:  Bilateral vertebral arteries demonstrate antegrade flow. Subclavians: Right subclavian artery was stenotic. Normal flow hemodynamics were seen in the left subclavian artery.  Carotid Duplex (04-19-18): Right ICA: 40-59% stenosis Left ICA: 1-39% stenosis Bilateral vertebral artery flow is antegrade.  Left subclavian artery waveforms are normal, right are stenotic.  No previous carotid duplex on file for comparison.    Bilateral LE Arterial Duplex (04-29-19): Right Graft #1: Right fem-pop bypass graft +------------------+--------+--------+---------+--------+                   PSV cm/sStenosisWaveform Comments +------------------+--------+--------+---------+--------+ Inflow            160             triphasic         +------------------+--------+--------+---------+--------+ Prox Anastomosis  111             biphasic           +------------------+--------+--------+---------+--------+ Proximal Graft    102             triphasic         +------------------+--------+--------+---------+--------+ Mid Graft         113             triphasic         +------------------+--------+--------+---------+--------+ Distal Graft      83              triphasic         +------------------+--------+--------+---------+--------+  Distal Anastomosis90              triphasic         +------------------+--------+--------+---------+--------+ Outflow           93              triphasic         +------------------+--------+--------+---------+--------+  +-------+--------+-----+--------+---------+--------+ LEFT   PSV cm/sRatioStenosisWaveform Comments +-------+--------+-----+--------+---------+--------+ CFA Mid175                  triphasic         +-------+--------+-----+--------+---------+--------+   Left Graft #1: Left fem-posterior tibial bypass graft +--------------------+--------+--------+---------+--------+                     PSV cm/sStenosisWaveform Comments +--------------------+--------+--------+---------+--------+ Inflow              124             triphasic         +--------------------+--------+--------+---------+--------+ Proximal Anastomosis165             triphasic         +--------------------+--------+--------+---------+--------+ Proximal Graft      60              biphasic          +--------------------+--------+--------+---------+--------+ Mid Graft           104             triphasic         +--------------------+--------+--------+---------+--------+ Distal Graft        144             triphasic         +--------------------+--------+--------+---------+--------+ Distal Anastomosis  157             triphasic         +--------------------+--------+--------+---------+--------+ Outflow             88              triphasic          +--------------------+--------+--------+---------+--------+ Summary: Right: Patent right fem-pop bypass graft with no evidence of stenosis. Left: Patent left fem-posterior tibial artery bypass graft with no evidence of stenosis.   Bilateral LE Arterial Duplex (04-19-18): Right: Highest velocity is 211 cm/s at the proximal anastomosis. All biphasic waveforms.  Left: Highest velocity is 189 cm/s at the inflow. Biphasic waveforms from the inflow to proximal graft, triphasic from mid graft to outflow.  No significant change compared to the exams on 04-07-16 and 04-13-17.   ABI Findings (04-29-19): +---------+------------------+-----+---------+--------+ Right    Rt Pressure (mmHg)IndexWaveform Comment  +---------+------------------+-----+---------+--------+ Brachial 140                                      +---------+------------------+-----+---------+--------+ ATA      182               1.26                   +---------+------------------+-----+---------+--------+ PTA      145               1.00 triphasic         +---------+------------------+-----+---------+--------+ DP                              triphasic         +---------+------------------+-----+---------+--------+  Great Toe98                0.68                   +---------+------------------+-----+---------+--------+  +---------+------------------+-----+---------+-------+ Left     Lt Pressure (mmHg)IndexWaveform Comment +---------+------------------+-----+---------+-------+ Brachial 145                                     +---------+------------------+-----+---------+-------+ ATA      148               1.02                  +---------+------------------+-----+---------+-------+ PTA      145               1.00 triphasic        +---------+------------------+-----+---------+-------+ DP                              triphasic         +---------+------------------+-----+---------+-------+ Great Toe105               0.72                  +---------+------------------+-----+---------+-------+  +-------+-----------+-----------+------------+------------+ ABI/TBIToday's ABIToday's TBIPrevious ABIPrevious TBI +-------+-----------+-----------+------------+------------+ Right  1.26       0.68       1.30        0.70         +-------+-----------+-----------+------------+------------+ Left   1.02       0.72       1.20        0.68         +-------+-----------+-----------+------------+------------+ Bilateral ABIs appear essentially unchanged compared to prior study on 04/19/2018. Summary: Right: Resting right ankle-brachial index is within normal range. No evidence of significant right lower extremity arterial disease. The right toe-brachial index is abnormal. RT great toe pressure = 98 mmHg. Left: Resting left ankle-brachial index is within normal range. No evidence of significant left lower extremity arterial disease. The left toe-brachial index is normal. LT Great toe pressure = 105 mmHg.   ASSESSMENT:  Derrick [name] is a 63 y.o. male who iss/pleft femoral to posterior tibial bypass and a sequential graft to the below-knee popliteal artery on March 17, 2015. He subsequently underwent right femoral to below-knee popliteal bypass using saphenous vein on May 11, 2015. He is also s/p bilateral CIA stents placed on 01-16-15.  He no longer has claudication symptoms with walking and walks a great deal. There are no signs of ischemia in his feet or legs. His bilateral pedal pulses are palpable.   He has no history of stroke or TIA.  Carotid duplex today shows 1-39% bilateral ICA stenosis, a year ago carotid duplex showed 40-59% stenosis in the right ICA and 1-39% in the left.  Bilateral ABI's remain normal, bilateral LE arterial duplex shows no stenosis of the bypass grafts.   He does not  have DM and he quit smoking in 1991. He takes a daily 81 mg ASA.    PLAN:  Based on the patient's vascular studies and examination, pt will return to clinic in1year with bilateral LE arterial duplex, ABI's, and carotid duplex. I advised pt to notify us if he develops concerns re the circulation in his feet or legs    I discussed in depth  with the patient the nature of atherosclerosis, and emphasized the importance of maximal medical management including strict control of blood pressure, blood glucose, and lipid levels, obtaining regular exercise, and cessation of smoking.  The patient is aware that without maximal medical management the underlying atherosclerotic disease process will progress, limiting the benefit of any interventions.  The patient was given information about stroke prevention and what symptoms should prompt the patient to seek immediate medical care.  The patient was given information about PAD including signs, symptoms, treatment, what symptoms should prompt the patient to seek immediate medical care, and risk reduction measures to take.  Thank you for allowing [place] to participate in this patient's care.  Clemon Chambers, RN, MSN, [name]-C [facility] Office: [phone]  Clinic MD: Laqueta Due 05/01/2019 3:15 PM

## 2019-05-01 NOTE — Patient Instructions (Signed)
Stroke Prevention Some medical conditions and lifestyle choices can lead to a higher risk for a stroke. You can help to prevent a stroke by making nutrition, lifestyle, and other changes. What nutrition changes can be made?   Eat healthy foods. ? Choose foods that are high in fiber. These include:  Fresh fruits.  Fresh vegetables.  Whole grains. ? Eat at least 5 or more servings of fruits and vegetables each day. Try to fill half of your plate at each meal with fruits and vegetables. ? Choose lean protein foods. These include:  Lowfat (lean) cuts of meat.  Chicken without skin.  Fish.  Tofu.  Beans.  Nuts. ? Eat low-fat dairy products. ? Avoid foods that:  Are high in salt (sodium).  Have saturated fat.  Have trans fat.  Have cholesterol.  Are processed.  Are premade.  Follow eating guidelines as told by your doctor. These may include: ? Reducing how many calories you eat and drink each day. ? Limiting how much salt you eat or drink each day to 1,500 milligrams (mg). ? Using only healthy fats for cooking. These include:  Olive oil.  Canola oil.  Sunflower oil. ? Counting how many carbohydrates you eat and drink each day. What lifestyle changes can be made?  Try to stay at a healthy weight. Talk to your doctor about what a good weight is for you.  Get at least 30 minutes of moderate physical activity at least 5 days a week. This can include: ? Fast walking. ? Biking. ? Swimming.  Do not use any products that have nicotine or tobacco. This includes cigarettes and e-cigarettes. If you need help quitting, ask your doctor. Avoid being around tobacco smoke in general.  Limit how much alcohol you drink to no more than 1 drink a day for nonpregnant women and 2 drinks a day for men. One drink equals 12 oz of beer, 5 oz of wine, or 1 oz of hard liquor.  Do not use drugs.  Avoid taking birth control pills. Talk to your doctor about the risks of taking birth  control pills if: ? You are over 35 years old. ? You smoke. ? You get migraines. ? You have had a blood clot. What other changes can be made?  Manage your cholesterol. ? It is important to eat a healthy diet. ? If your cholesterol cannot be managed through your diet, you may also need to take medicines. Take medicines as told by your doctor.  Manage your diabetes. ? It is important to eat a healthy diet and to exercise regularly. ? If your blood sugar cannot be managed through diet and exercise, you may need to take medicines. Take medicines as told by your doctor.  Control your high blood pressure (hypertension). ? Try to keep your blood pressure below 130/80. This can help lower your risk of stroke. ? It is important to eat a healthy diet and to exercise regularly. ? If your blood pressure cannot be managed through diet and exercise, you may need to take medicines. Take medicines as told by your doctor. ? Ask your doctor if you should check your blood pressure at home. ? Have your blood pressure checked every year. Do this even if your blood pressure is normal.  Talk to your doctor about getting checked for a sleep disorder. Signs of this can include: ? Snoring a lot. ? Feeling very tired.  Take over-the-counter and prescription medicines only as told by your doctor. These may   include aspirin or blood thinners (antiplatelets or anticoagulants).  Make sure that any other medical conditions you have are managed. Where to find more information  American Stroke Association: www.strokeassociation.org  National Stroke Association: www.stroke.org Get help right away if:  You have any symptoms of stroke. "BE FAST" is an easy way to remember the main warning signs: ? B - Balance. Signs are dizziness, sudden trouble walking, or loss of balance. ? E - Eyes. Signs are trouble seeing or a sudden change in how you see. ? F - Face. Signs are sudden weakness or loss of feeling of the face,  or the face or eyelid drooping on one side. ? A - Arms. Signs are weakness or loss of feeling in an arm. This happens suddenly and usually on one side of the body. ? S - Speech. Signs are sudden trouble speaking, slurred speech, or trouble understanding what people say. ? T - Time. Time to call emergency services. Write down what time symptoms started.  You have other signs of stroke, such as: ? A sudden, very bad headache with no known cause. ? Feeling sick to your stomach (nausea). ? Throwing up (vomiting). ? Jerky movements you cannot control (seizure). These symptoms may represent a serious problem that is an emergency. Do not wait to see if the symptoms will go away. Get medical help right away. Call your local emergency services (911 in the [place] Do not drive yourself to the hospital. Summary  You can prevent a stroke by eating healthy, exercising, not smoking, drinking less alcohol, and treating other health problems, such as diabetes, high blood pressure, or high cholesterol.  Do not use any products that contain nicotine or tobacco, such as cigarettes and e-cigarettes.  Get help right away if you have any signs or symptoms of a stroke. This information is not intended to replace advice given to you by your health care provider. Make sure you discuss any questions you have with your health care provider. Document Released: 11/15/2011 Document Revised: 07/12/2018 Document Reviewed: 08/17/2016 Elsevier Patient Education  2020 Elsevier Inc.     Peripheral Vascular Disease  Peripheral vascular disease (PVD) is a disease of the blood vessels that are not part of your heart and brain. A simple term for PVD is poor circulation. In most cases, PVD narrows the blood vessels that carry blood from your heart to the rest of your body. This can reduce the supply of blood to your arms, legs, and internal organs, like your stomach or kidneys. However, PVD most often affects a person's lower  legs and feet. Without treatment, PVD tends to get worse. PVD can also lead to acute ischemic limb. This is when an arm or leg suddenly cannot get enough blood. This is a medical emergency. Follow these instructions at home: Lifestyle  Do not use any products that contain nicotine or tobacco, such as cigarettes and e-cigarettes. If you need help quitting, ask your doctor.  Lose weight if you are overweight. Or, stay at a healthy weight as told by your doctor.  Eat a diet that is low in fat and cholesterol. If you need help, ask your doctor.  Exercise regularly. Ask your doctor for activities that are right for you. General instructions  Take over-the-counter and prescription medicines only as told by your doctor.  Take good care of your feet: ? Wear comfortable shoes that fit well. ? Check your feet often for any cuts or sores.  Keep all follow-up visits as told   by your doctor This is important. Contact a doctor if:  You have cramps in your legs when you walk.  You have leg pain when you are at rest.  You have coldness in a leg or foot.  Your skin changes.  You are unable to get or have an erection (erectile dysfunction).  You have cuts or sores on your feet that do not heal. Get help right away if:  Your arm or leg turns cold, numb, and blue.  Your arms or legs become red, warm, swollen, painful, or numb.  You have chest pain.  You have trouble breathing.  You suddenly have weakness in your face, arm, or leg.  You become very confused or you cannot speak.  You suddenly have a very bad headache.  You suddenly cannot see. Summary  Peripheral vascular disease (PVD) is a disease of the blood vessels.  A simple term for PVD is poor circulation. Without treatment, PVD tends to get worse.  Treatment may include exercise, low fat and low cholesterol diet, and quitting smoking. This information is not intended to replace advice given to you by your health care  provider. Make sure you discuss any questions you have with your health care provider. Document Released: 08/10/2009 Document Revised: 04/28/2017 Document Reviewed: 06/23/2016 Elsevier Patient Education  2020 Elsevier Inc.  

## 2019-05-21 ENCOUNTER — Other Ambulatory Visit: Payer: Self-pay

## 2019-05-21 ENCOUNTER — Encounter: Payer: BC Managed Care – PPO | Admitting: *Deleted

## 2019-05-21 VITALS — BP 147/71 | HR 60 | Wt 213.0 lb

## 2019-05-21 DIAGNOSIS — Z006 Encounter for examination for normal comparison and control in clinical research program: Secondary | ICD-10-CM

## 2019-05-22 ENCOUNTER — Other Ambulatory Visit: Payer: Self-pay | Admitting: *Deleted

## 2019-05-22 DIAGNOSIS — I6523 Occlusion and stenosis of bilateral carotid arteries: Secondary | ICD-10-CM

## 2019-05-22 DIAGNOSIS — I779 Disorder of arteries and arterioles, unspecified: Secondary | ICD-10-CM

## 2019-05-22 DIAGNOSIS — Z95828 Presence of other vascular implants and grafts: Secondary | ICD-10-CM

## 2019-05-22 NOTE — Research (Signed)
Patient to research clinic for visit [phone] in the Clear research study.  No aes or saes to report.  New medication dispensed and next phone call and clinic visit scheduled.

## 2019-07-22 ENCOUNTER — Encounter: Payer: Self-pay | Admitting: Cardiovascular Disease

## 2019-07-22 ENCOUNTER — Other Ambulatory Visit: Payer: Self-pay

## 2019-07-22 ENCOUNTER — Ambulatory Visit: Payer: BC Managed Care – PPO | Admitting: Cardiovascular Disease

## 2019-07-22 VITALS — BP 126/48 | HR 61 | Ht 74.0 in | Wt 213.8 lb

## 2019-07-22 DIAGNOSIS — I200 Unstable angina: Secondary | ICD-10-CM | POA: Diagnosis not present

## 2019-07-22 DIAGNOSIS — E782 Mixed hyperlipidemia: Secondary | ICD-10-CM | POA: Diagnosis not present

## 2019-07-22 DIAGNOSIS — I25700 Atherosclerosis of coronary artery bypass graft(s), unspecified, with unstable angina pectoris: Secondary | ICD-10-CM

## 2019-07-22 NOTE — Patient Instructions (Addendum)
Medication Instructions:  Your physician recommends that you continue on your current medications as directed. Please refer to the Current Medication list given to you today.  *If you need a refill on your cardiac medications before your next appointment, please call your pharmacy*  Lab Work: None Ordered If you have labs (blood work) drawn today and your tests are completely normal, you will receive your results only by: Marland Kitchen MyChart Message (if you have MyChart) OR . A paper copy in the mail If you have any lab test that is abnormal or we need to change your treatment, we will call you to review the results.   Testing/Procedures: None Ordered   Follow-Up: At [facility], you and your health needs are our priority.  As part of our continuing mission to provide you with exceptional heart care, we have created designated Provider Care Teams.  These Care Teams include your primary Cardiologist (physician) and Advanced Practice Providers (APPs -  Physician Assistants and Nurse Practitioners) who all work together to provide you with the care you need, when you need it.  Your next appointment:   1 year(s)  The format for your next appointment:   In Person  Provider:   You may see Kristeen Miss, MD or one of the following Advanced Practice Providers on your designated Care Team:    Tereso Newcomer, PA-C  Vin Scipio, [place]  Berton Bon, NP   COVID-19 Vaccine Information can be found at: [email or web address] For questions related to vaccine distribution or appointments, please email [email or web address] or call [phone].

## 2019-07-22 NOTE — Progress Notes (Signed)
Derrick [name] Date of Birth  08-Aug-1955 [facility] [address] [place], [place]  [address] [phone]  Fax  [phone]  Problem List: 1. CAD, status post stents to the LAD,  left circumflex artery 2006 . His chronically occluded right coronary artery S/p CABG ( bilateral IMA )  2. hyperlipidemia-intolerant to statins 3. Hypertension 4. Peripheral Vascular disease    64 yo male with history of CAD - s/p stents to LAD and LCx.  Chronically occluded RCA.  No angina.  He eats a very fatty diet - lots of fried foods.  Not exercising.  He does lots of yard work and has never had any episodes of angina.  Oct 01, 2013:  Derrick [name] was seen by Derrick [name] in Oct. 2014 for pre-op eval prior to rotator cuff surgery.    His myoview study showed:  Low risk stress nuclear study with a large, severe intensity, partially reversible inferior defect consistent with prior inferior infarct and very mild peri-infarct ischemia.  LV Ejection Fraction: 54%. LV Wall Motion: Inferior hypokinesis.  He has shoulder surgery without difficulty.  He's doing well. He denies any chest pain or shortness of breath.  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     Feb. 12 2018:    Has done well.  Has hx of PVD - [employer]rick [name])   Oct. 1 2018;    Derrick [name] has been having some DOE , chest pain with exertion Multiple occasions over the past month,,   Gradually getting worse   Occurred while he was pruning and cleaning up his yard.  Is exercising more.  Works 11 hours a day . 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  Symptoms feel very similar to his previous symptoms in 2006  May 05, 2017:  Derrick [name] is doing well.  He had a cath in October that showed significant left [name] disease as well as other narrowings.  He had coronary artery bypass grafting.  He is done well since that time.  He had some postoperative atrial fibrillation.  He is on Cardizem 120 mg a day  as well as metoprolol 25 mg twice a day.  He was not started on anticoagulation.  August 31, 2017: Derrick [name] is seen today for follow-up visit.  He has a history of coronary artery bypass grafting with postoperative atrial fibrillation.  History of hypertension and hyperlipidemia.  Has gained some weight.  Walks some  No CP ,   Lipids are elevatee.  intolerent to statins   Sept. 26, 2019: Doing well since his CABG in Oct. 2018 Was noted to have very minimal carotid artery disease at that time. Walks some .   9000-10000 steps a day  Still works  Hydrologist )   Is in the Clear trial .     July 22, 2019  Hx of CABG in 2018.  No further episode so aF recently  Still at [place] to retire.soon .    Got his shingles shot Advised him to delay his covid shot for several weeks.   Current Outpatient Medications  Medication Sig Dispense Refill  . acetaminophen (TYLENOL) 650 MG CR tablet Take 650 mg by mouth 2 (two) times daily as needed for pain.     Marland Kitchen aspirin EC 81 MG tablet Take 1 tablet (81 mg total) by mouth daily.    . Bempedoic Acid 180 MG TABS Take 180  mg by mouth daily. Use as directed - for a 3-year period    . calcitRIOL (ROCALTROL) 0.5 MCG capsule Take 0.5 mcg by mouth daily.    . calcium carbonate (OSCAL) 1500 (600 Ca) MG TABS tablet Take 600 mg of elemental calcium 2 (two) times daily with a meal by mouth. Patient takes 2 in the morning and 1 in the evening.    . Cyanocobalamin (VITAMIN B-12) 2500 MCG SUBL Place 2,500 mcg under the tongue daily.    Marland Kitchen diltiazem (CARDIZEM CD) 120 MG 24 hr capsule TAKE 1 CAPSULE BY MOUTH EVERY DAY 90 capsule 2  . ezetimibe (ZETIA) 10 MG tablet Take 10 mg by mouth daily.      . famotidine (PEPCID) 20 MG tablet Take 20 mg by mouth 2 (two) times daily.    . fenofibrate 160 MG tablet Take 160 mg by mouth daily.    Marland Kitchen ipratropium (ATROVENT) 0.03 % nasal spray Place 1 spray into both nostrils as needed.    Marland Kitchen losartan (COZAAR) 100 MG tablet Take  100 mg by mouth every evening.     . Magnesium Gluconate 550 MG TABS Take 250 mg by mouth daily.    . metoprolol tartrate (LOPRESSOR) 25 MG tablet TAKE 1 TABLET BY MOUTH TWICE A DAY 180 tablet 3  . neomycin-polymyxin-hydrocortisone (CORTISPORIN) 3.5-10000-1 OTIC suspension Place 3 drops into the left ear as needed.    . nitroGLYCERIN (NITROSTAT) 0.4 MG SL tablet Place 1 tablet (0.4 mg total) under the tongue every 5 (five) minutes as needed for chest pain. 25 tablet 5  . Omega-3 Fatty Acids (OMEGA-3 EPA FISH OIL PO) Take 3,210 mg by mouth 2 (two) times daily.    . traZODone (DESYREL) 50 MG tablet Take 50 mg by mouth at bedtime.     No current facility-administered medications for this visit.     Allergies  Allergen Reactions  . Codeine Itching  . Atorvastatin Other (See Comments)    Muscle aches Muscle aches  . Crestor [Rosuvastatin Calcium]     Muscle aches  . Meloxicam Other (See Comments)    Can not take due to kidneys  . Statins Other (See Comments)    myalgia    Past Medical History:  Diagnosis Date  . Coronary artery disease 2006   stents x2=LAD LCX; totally occluded RCA b. CABG 03/02/17 x2 (left internal mammary artery to LAD, Y-graft right mammary from left mammary to OM).  . Dyslipidemia   . GERD (gastroesophageal reflux disease)   . History of bronchitis   . Hyperlipidemia    statin intolerant  . Hyperparathyroidism   . Hypertension   . Insomnia   . Metatarsalgia   . OA (osteoarthritis)   . Peripheral vascular disease (HCC)   . Seasonal allergies   . Urinary frequency   . Wears glasses     Past Surgical History:  Procedure Laterality Date  . CARDIAC CATHETERIZATION  10/28/2004   circ and mid LAD chyper stents  . CERVICAL FUSION  [id number]  . CERVICAL SPINE SURGERY  2004  . COLONOSCOPY    . CORONARY ANGIOPLASTY  2006   stents placed   . CORONARY ARTERY BYPASS GRAFT N/A 03/02/2017   Procedure: CORONARY ARTERY BYPASS GRAFTING (CABG), ON PUMP, TIMES TWO,  USING BILATERAL MAMMARY ARTERIES;  Surgeon: Loreli Slot, MD;  Location: [facility] OR;  Service: Open Heart Surgery;  Laterality: N/A;  . ELBOW ARTHROPLASTY  1994   right  . ENDARTERECTOMY FEMORAL Left 03/17/2015  Procedure: ENDARTERECTOMY COMMON FEMORAL WITH PROFUNDOPLASTY;  Surgeon: Sherren Kerns, MD;  [facility] OR;  Service: Vascular;  Laterality: Left;  . FEMORAL-POPLITEAL BYPASS GRAFT Left 03/17/2015   Procedure: BYPASS GRAFT FEMORAL-POPLITEAL ARTERY-LEFT LEG AND POSTERIOR TIBIAL SEQUENTIAL GRAFT TO BELOW KNEE POPLITEAL ARTERY;  Surgeon: Sherren Kerns, MD;  Location: [facility] OR;  Service: Vascular;  Laterality: Left;  . FEMORAL-POPLITEAL BYPASS GRAFT Right 05/11/2015   Procedure:  RIGHT FEMORAL-BELOW THE KNEE POPLITEAL ARTERY BYPASS GRAFT USING NON REVERSE RIGHT GREATER SAPHENOUS VEIN;  Surgeon: Sherren Kerns, MD;  Location: [facility] OR;  Service: Vascular;  Laterality: Right;  . INCISION AND DRAINAGE ABSCESS POSTERIOR CERVICALSPINE  2009  . INTRAOPERATIVE ARTERIOGRAM Left 03/17/2015   Procedure: INTRA OPERATIVE ARTERIOGRAM X2;  Surgeon: Sherren Kerns, MD;  Location: [facility] OR;  Service: Vascular;  Laterality: Left;  . INTRAOPERATIVE ARTERIOGRAM Right 05/11/2015   Procedure: INTRA OPERATIVE ARTERIOGRAM;  Surgeon: Sherren Kerns, MD;  Location: [facility] OR;  Service: Vascular;  Laterality: Right;  . LEFT HEART CATH AND CORONARY ANGIOGRAPHY N/A 03/01/2017   Procedure: LEFT HEART CATH AND CORONARY ANGIOGRAPHY;  Surgeon: Kathleene Hazel, MD;  Location: [facility] INVASIVE CV LAB;  Service: Cardiovascular;  Laterality: N/A;  . PATCH ANGIOPLASTY Left 03/17/2015   Procedure: VEIN PATCH ANGIOPLASTY COMMON FEMORAL ARTERY;  Surgeon: Sherren Kerns, MD;  Location: [facility] OR;  Service: Vascular;  Laterality: Left;  . PERIPHERAL VASCULAR CATHETERIZATION N/A 01/16/2015   Procedure: Abdominal Aortogram;  Surgeon: Sherren Kerns, MD;  Location: [facility] INVASIVE CV LAB;  Service: Cardiovascular;  Laterality: N/A;  .  SHOULDER OPEN ROTATOR CUFF REPAIR Right 04/23/2013   Procedure: RIGHT TWO TENDON ROTATOR CUFF REPAIR  SUBACROMIAL DECOMPRESSION AND OPEN DISTAL CLAVICLE RESECTION;  Surgeon: Wyn Forster., MD;  Location: [facility];  Service: Orthopedics;  Laterality: Right;  . stents placed   2006  . TEE WITHOUT CARDIOVERSION N/A 03/02/2017   Procedure: TRANSESOPHAGEAL ECHOCARDIOGRAM (TEE);  Surgeon: Loreli Slot, MD;  Location: [facility] OR;  Service: Open Heart Surgery;  Laterality: N/A;  . VEIN HARVEST Right 05/11/2015   Procedure: WITH NON REVERSE RIGHT GREATER SAPHENOUS VEIN HARVEST;  Surgeon: Sherren Kerns, MD;  Location: [facility] OR;  Service: Vascular;  Laterality: Right;    Social History   Tobacco Use  Smoking Status Former Smoker  . Types: Cigarettes  . Quit date: 05/30/1989  . Years since quitting: 30.1  Smokeless Tobacco Never Used    Social History   Substance and Sexual Activity  Alcohol Use Yes  . Alcohol/week: 0.0 standard drinks   Comment: one drink a month    Family History  Problem Relation Age of Onset  . Arthritis Mother   . Varicose Veins Mother   . Lung cancer Father   . COPD Father        Lung  . Cancer Brother        Lukemia  . COPD Brother   . Cancer Brother        Prostate    Reviw of Systems:  Reviewed in the HPI.  All other systems are negative.  Physical Exam: Blood pressure (!) 126/48, pulse 61, height 6\' 2"  (1.88 m), weight 213 lb 12 oz (97 kg), SpO2 95 %.  GEN:  Well nourished, well developed in no acute distress HEENT: Normal NECK: No JVD; No carotid bruits LYMPHATICS: No lymphadenopathy CARDIAC: RRR , no murmurs, rubs, gallops RESPIRATORY:  Clear to auscultation without rales, wheezing or rhonchi  ABDOMEN:  Soft, non-tender, non-distended MUSCULOSKELETAL:  No edema; No deformity  SKIN: Warm and dry NEUROLOGIC:  Alert and oriented x 3  ECG:    Assessment / Plan:    1. CAD -    He is s/p CABG .  He denies having any  episodes of angina.  Continue current medications.  2.   Hyperlipidemia :  On Zetia:   Managed by his primary MD   3.   Carotid artery disease: has a soft right carotid bruit  He was found to have mild carotid artery disease by pre-surgical vascular study. Repeat carotid duplex at [facility] ( he is already a patient of Fabienne Bruns )       Kristeen Miss, MD  07/22/2019 4:14 PM    [facility] [address] [place],  [address] [place], [place]  [address] Pager [phone] Phone: [phone]; Fax: [phone]

## 2019-08-05 ENCOUNTER — Other Ambulatory Visit: Payer: Self-pay | Admitting: Podiatry

## 2019-08-05 ENCOUNTER — Ambulatory Visit: Payer: BC Managed Care – PPO | Admitting: Podiatry

## 2019-08-05 ENCOUNTER — Other Ambulatory Visit: Payer: Self-pay

## 2019-08-05 ENCOUNTER — Ambulatory Visit (INDEPENDENT_AMBULATORY_CARE_PROVIDER_SITE_OTHER): Payer: BC Managed Care – PPO

## 2019-08-05 ENCOUNTER — Encounter: Payer: Self-pay | Admitting: Podiatry

## 2019-08-05 DIAGNOSIS — G5791 Unspecified mononeuropathy of right lower limb: Secondary | ICD-10-CM

## 2019-08-05 DIAGNOSIS — M779 Enthesopathy, unspecified: Secondary | ICD-10-CM

## 2019-08-05 DIAGNOSIS — G629 Polyneuropathy, unspecified: Secondary | ICD-10-CM | POA: Diagnosis not present

## 2019-08-08 NOTE — Progress Notes (Signed)
Subjective:   Patient ID: Derrick [name], male   DOB: 64 y.o.   MRN: [id number]   HPI Patient states he dropped a device on his right foot and is concerned that he may have broke something and he wants an x-ray and states it is been sore   ROS      Objective:  Physical Exam  Neurovascular status intact with patient's right foot showing pain on top with inflammation noted and bruising     Assessment:  Trauma to the right foot with inflammation bruising noted     Plan:  H&P reviewed condition and recommended ice therapy anti-inflammatories for this and wider type shoes.  Patient will be seen back first.  I educated him on oral anti-inflammatories to take at home  X-rays taken indicated there is no signs of fracture associated with this injury

## 2019-08-16 IMAGING — DX DG CHEST 1V PORT
1 series · 1 of 1 positions shown · non-contrast
Comparison: 07/05/2007

CLINICAL DATA: Preop CABG.

EXAM:
PORTABLE CHEST 1 VIEW

[chest ap]
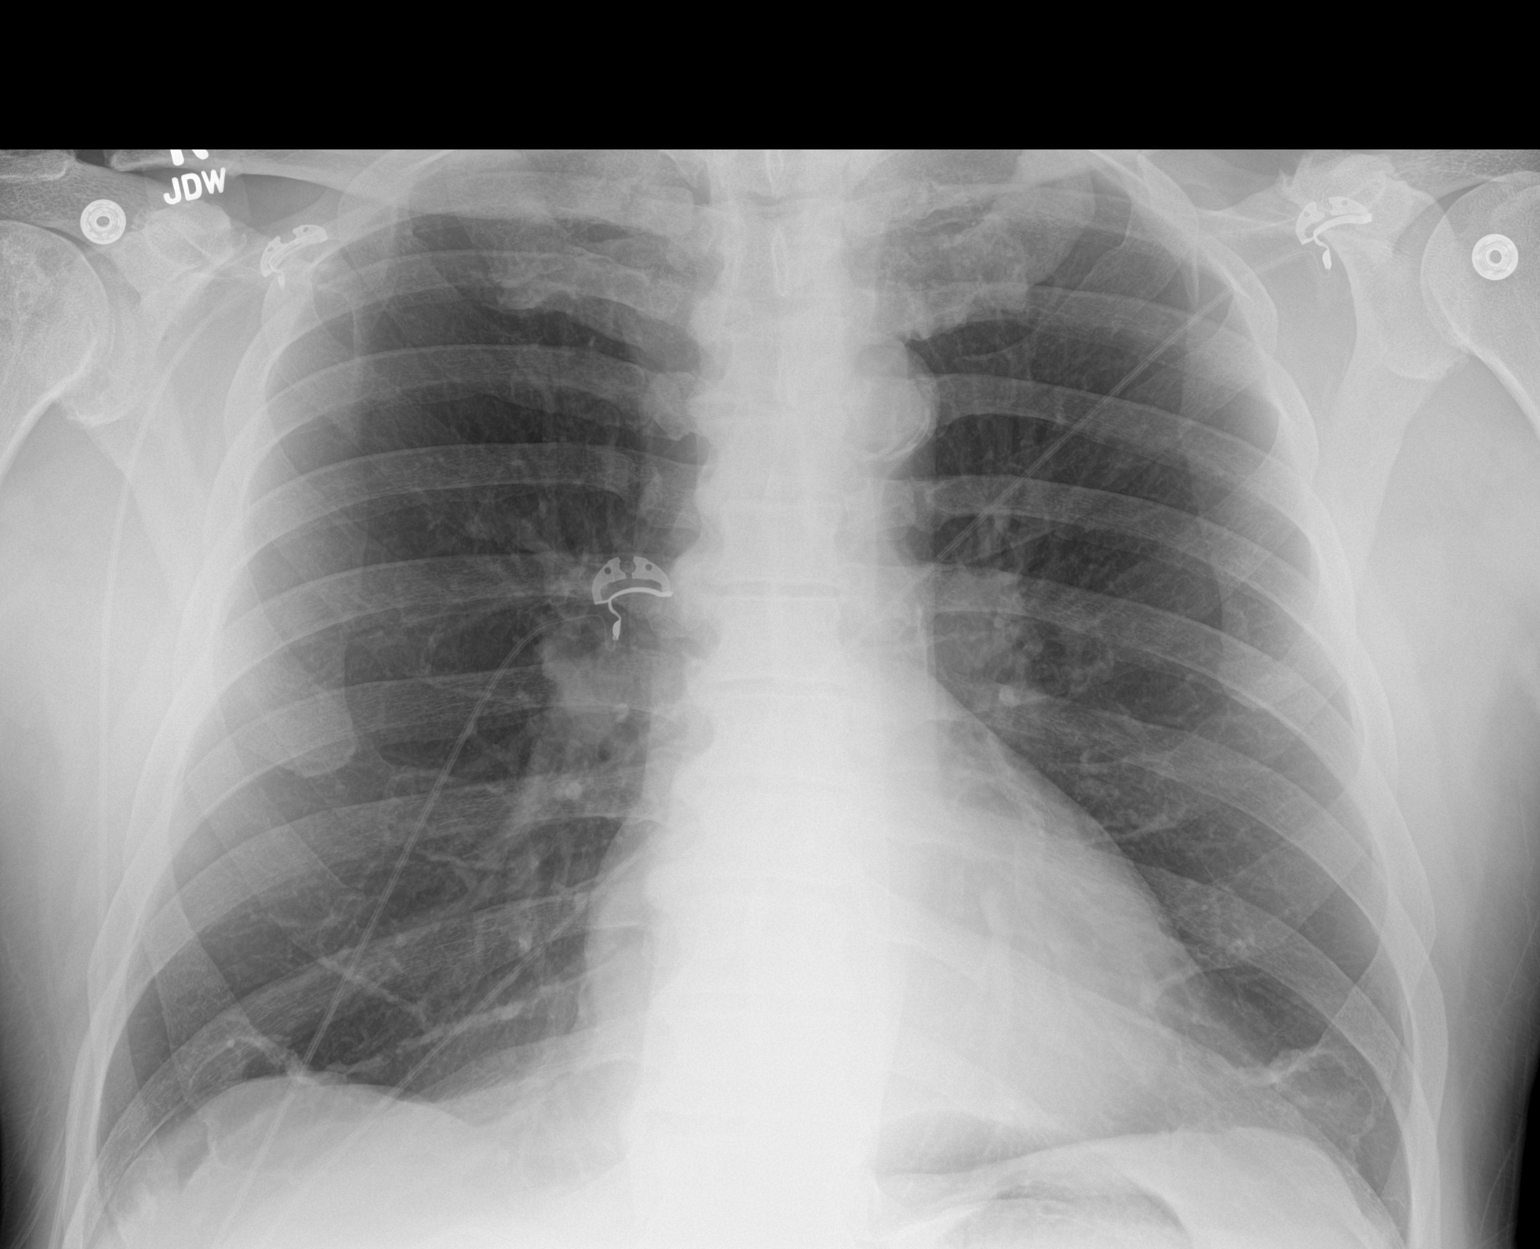

[1 of 1 positions shown; findings below may reference images not displayed]

FINDINGS: Lungs are adequately inflated without consolidation or effusion.
Cardiomediastinal silhouette is within normal. There is calcified
plaque over the aortic arch. There are degenerative changes of the
spine.
IMPRESSION: No acute cardiopulmonary disease.

Aortic Atherosclerosis ([id number]).

## 2019-08-18 IMAGING — DX DG CHEST 1V PORT
1 series · 1 of 1 positions shown · non-contrast
Comparison: 03/02/2017

CLINICAL DATA: Status post coronary bypass grafting

EXAM:
PORTABLE CHEST 1 VIEW

[chest ap]
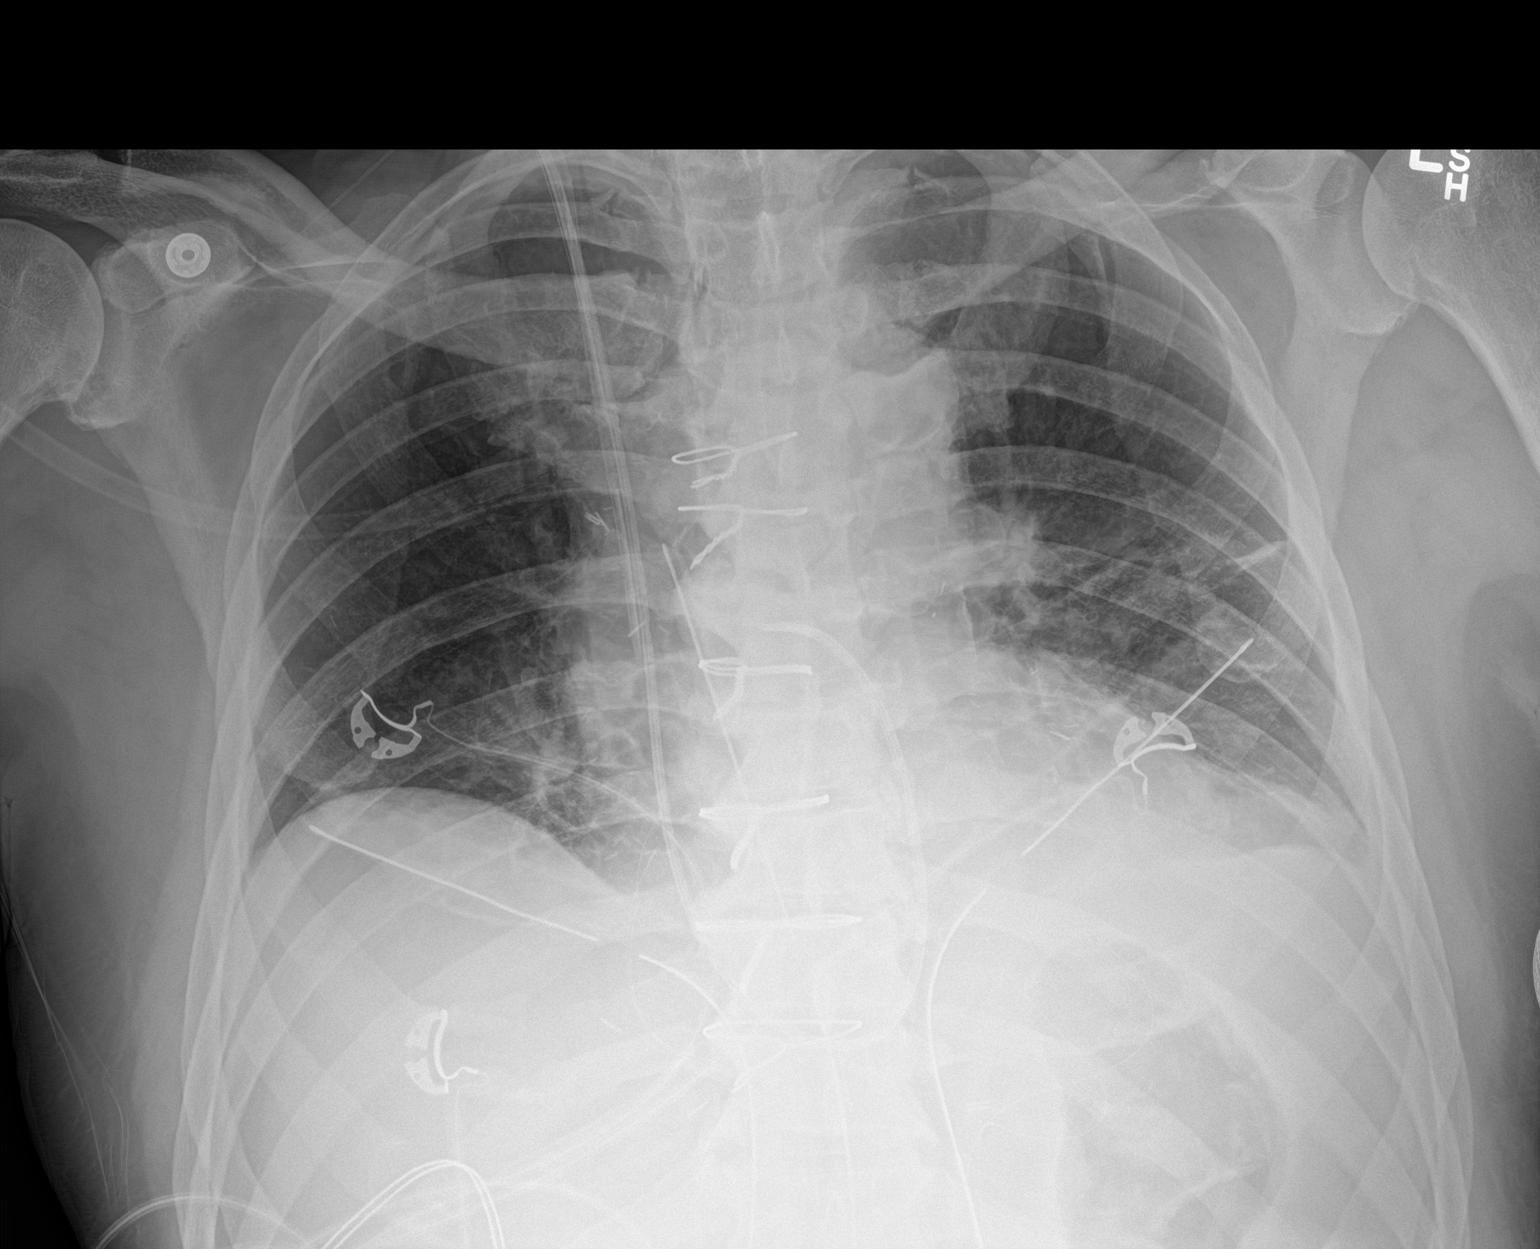

[1 of 1 positions shown; findings below may reference images not displayed]

FINDINGS: Swan-Ganz catheter, mediastinal drain and bilateral thoracostomy
catheters are again noted and stable. The endotracheal tube and
nasogastric catheter have been removed. A right jugular line remains
in place as well. Bibasilar atelectatic changes are noted left
slightly greater than right new from the prior exam. No pneumothorax
is noted
IMPRESSION: Bibasilar atelectatic changes.

Tubes and lines as described above.

## 2019-08-20 IMAGING — DX DG CHEST 2V
2 series · 2 of 2 positions shown · non-contrast
Comparison: 03/04/2017

CLINICAL DATA: History CABG

EXAM:
CHEST  2 VIEW

[chest pa]
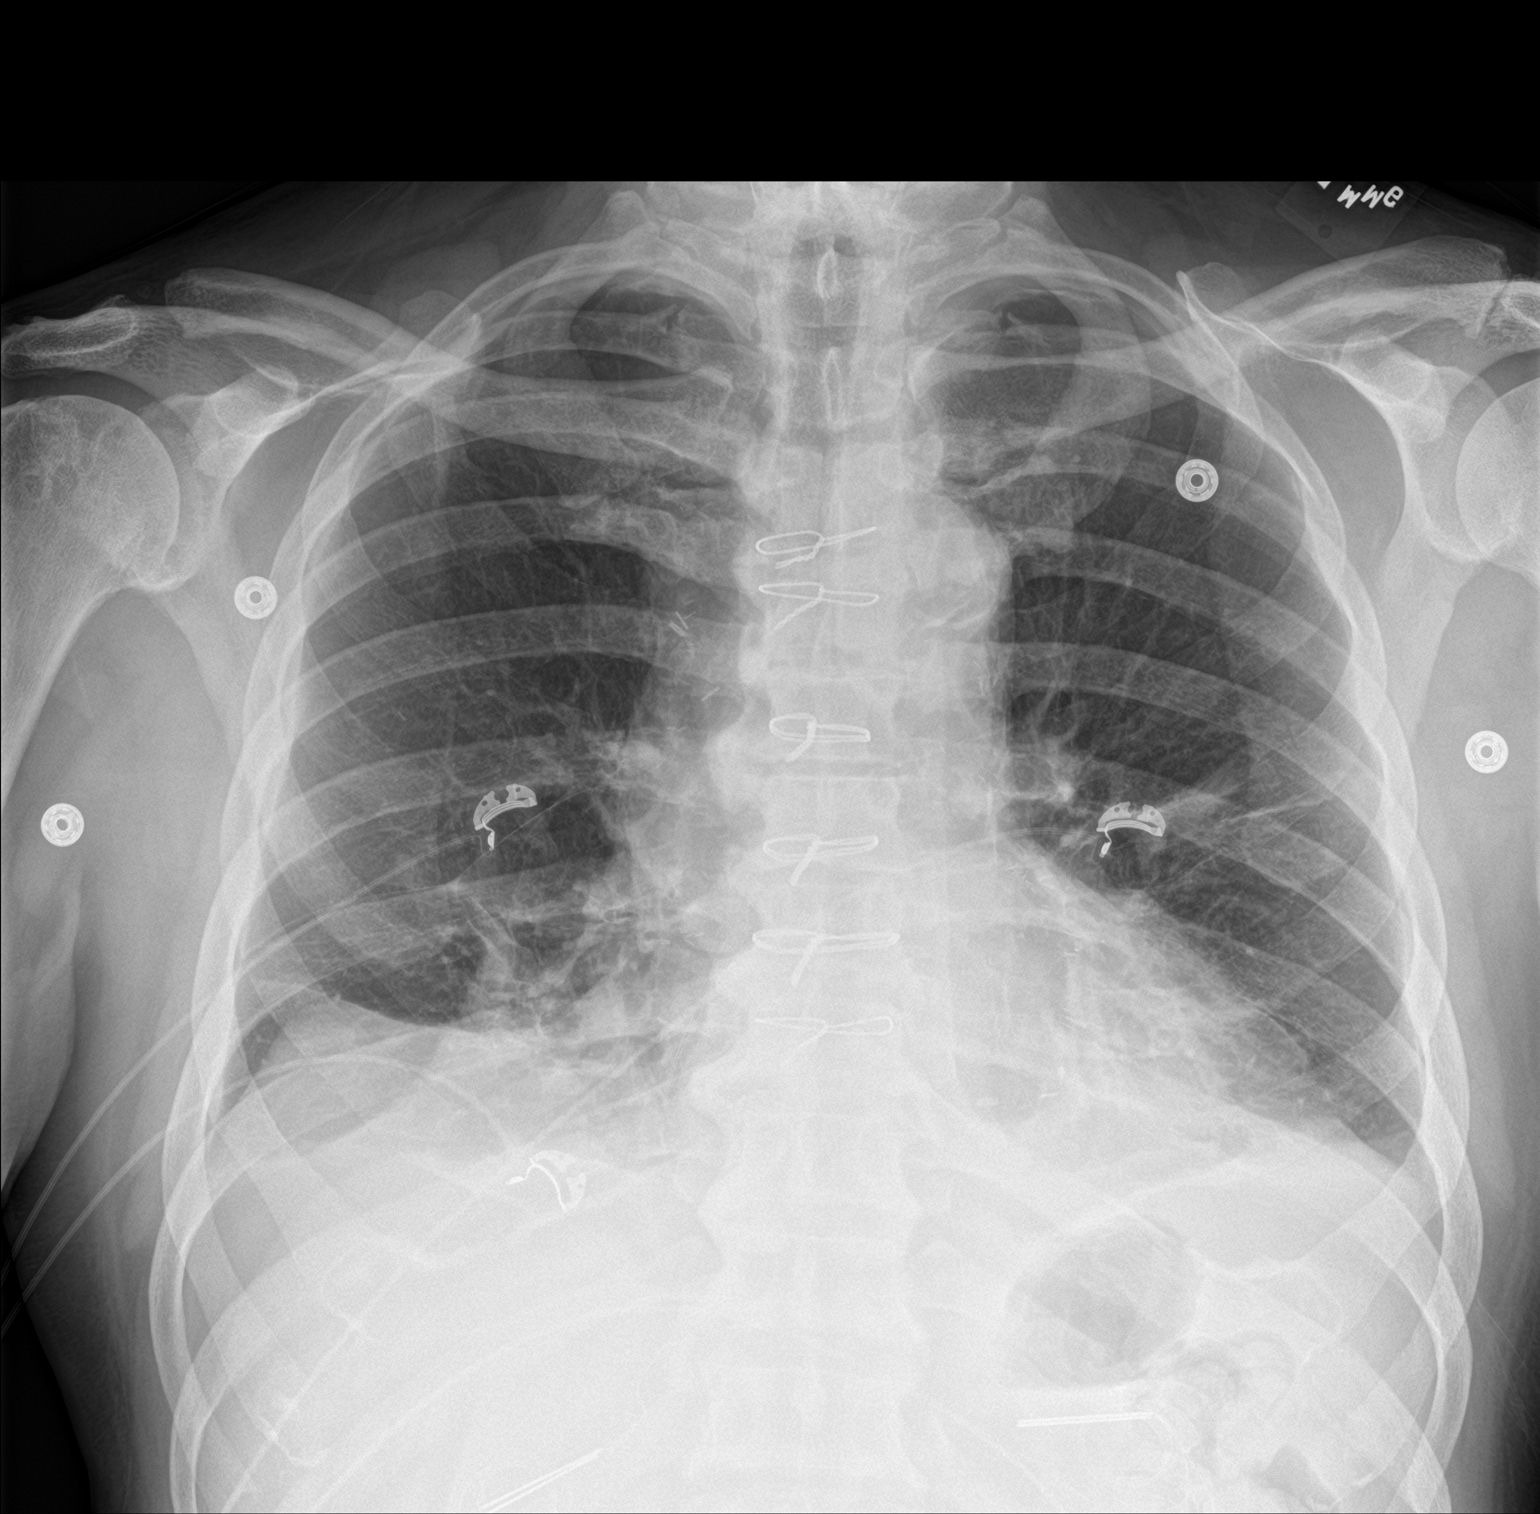

[chest lat]
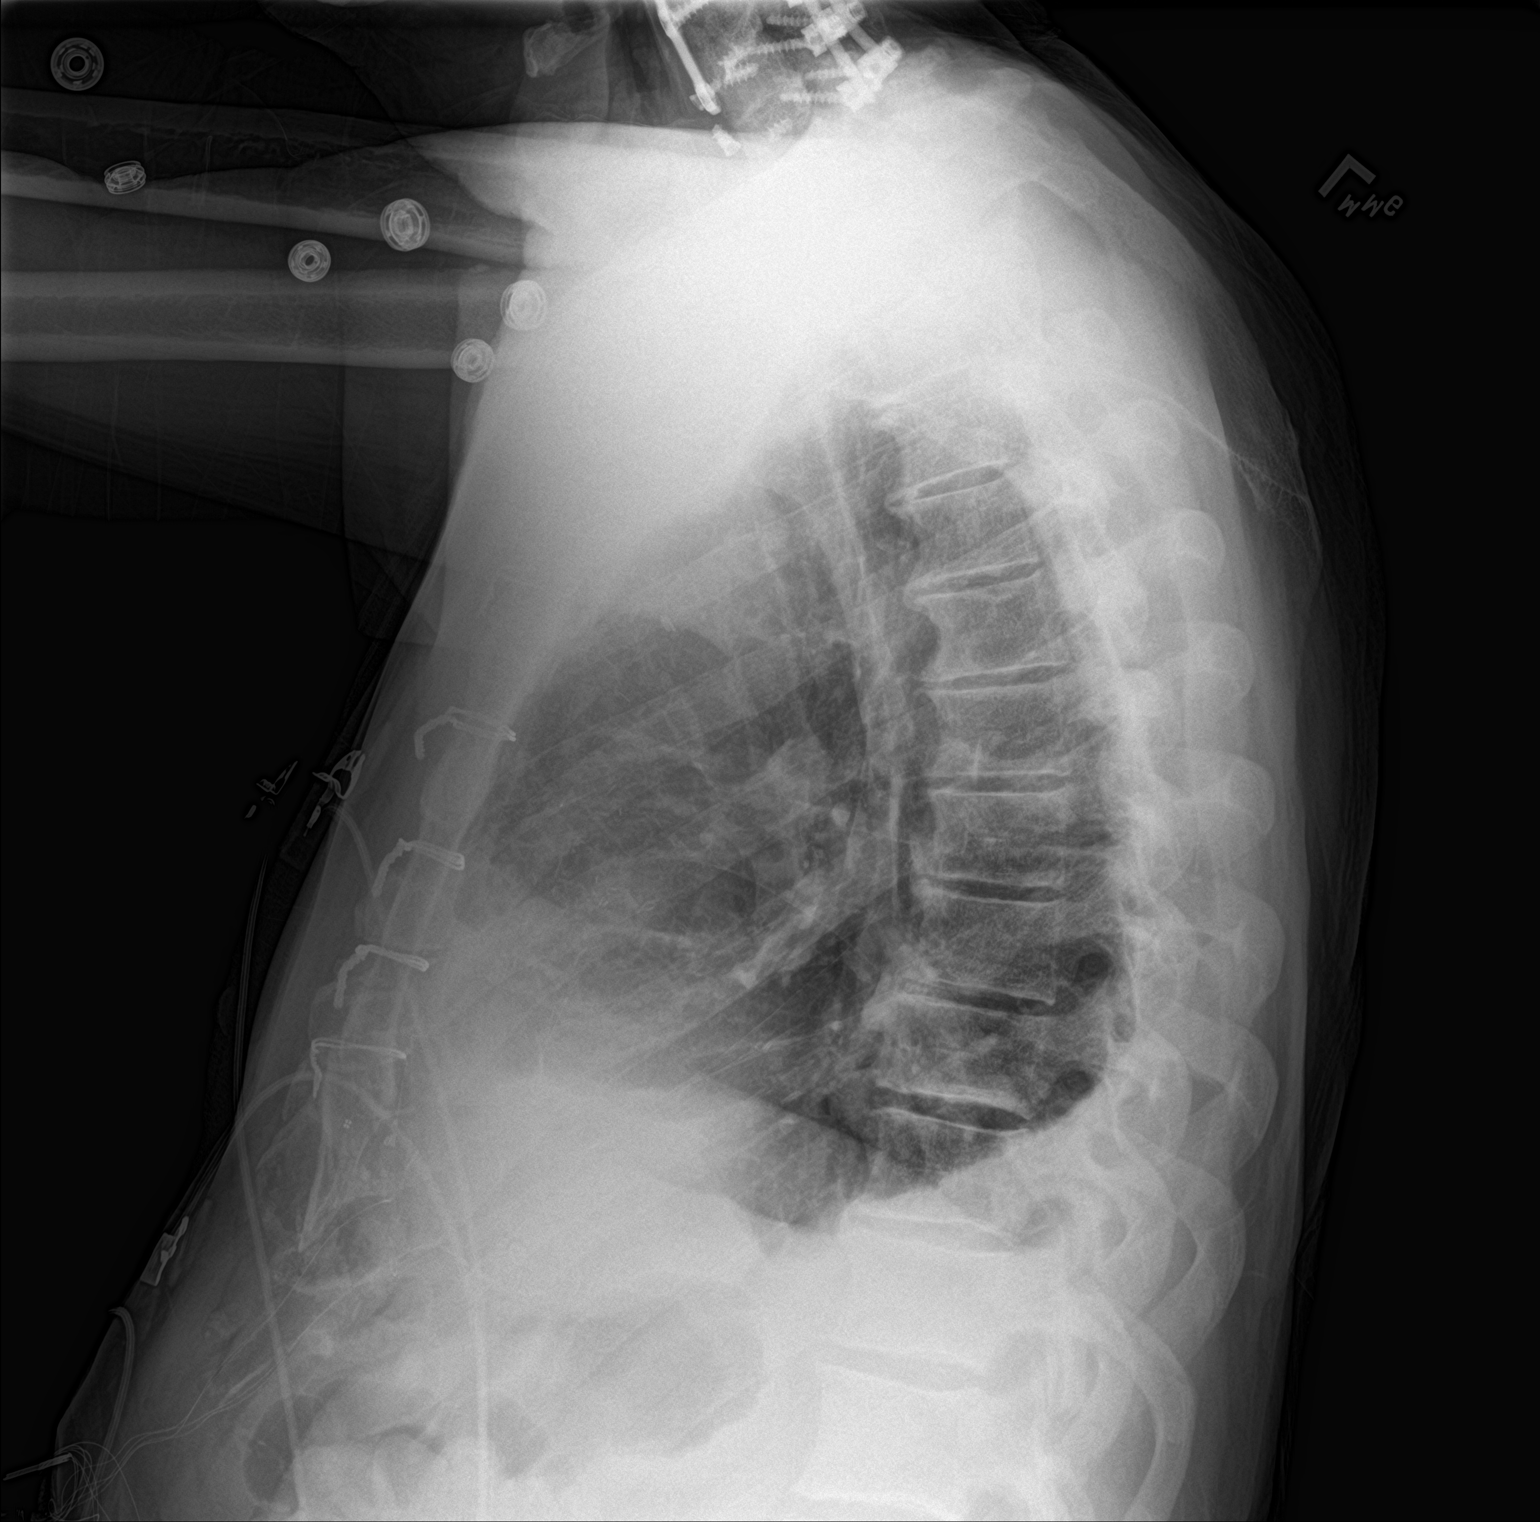

[2 of 2 positions shown; findings below may reference images not displayed]

FINDINGS: Interval removal of the right jugular central venous catheter.

Bibasilar hazy airspace disease likely reflecting atelectasis. No
pneumothorax. Small left pleural effusion. Stable cardiomediastinal
silhouette. Changes from recent CABG. No acute osseous abnormality.
IMPRESSION: 1. Interval removal of the right jugular central venous catheter. No
pneumothorax.
2. Mild bibasilar atelectasis.  Small left pleural effusion.

## 2019-09-12 DIAGNOSIS — M79642 Pain in left hand: Secondary | ICD-10-CM | POA: Insufficient documentation

## 2019-09-18 IMAGING — DX DG CHEST 2V
2 series · 2 of 2 positions shown · non-contrast
Comparison: Radiographs March 05, 2017.

CLINICAL DATA: Status post coronary artery bypass graft.

EXAM:
CHEST  2 VIEW

[dg chest 2 view (1 of 2)]
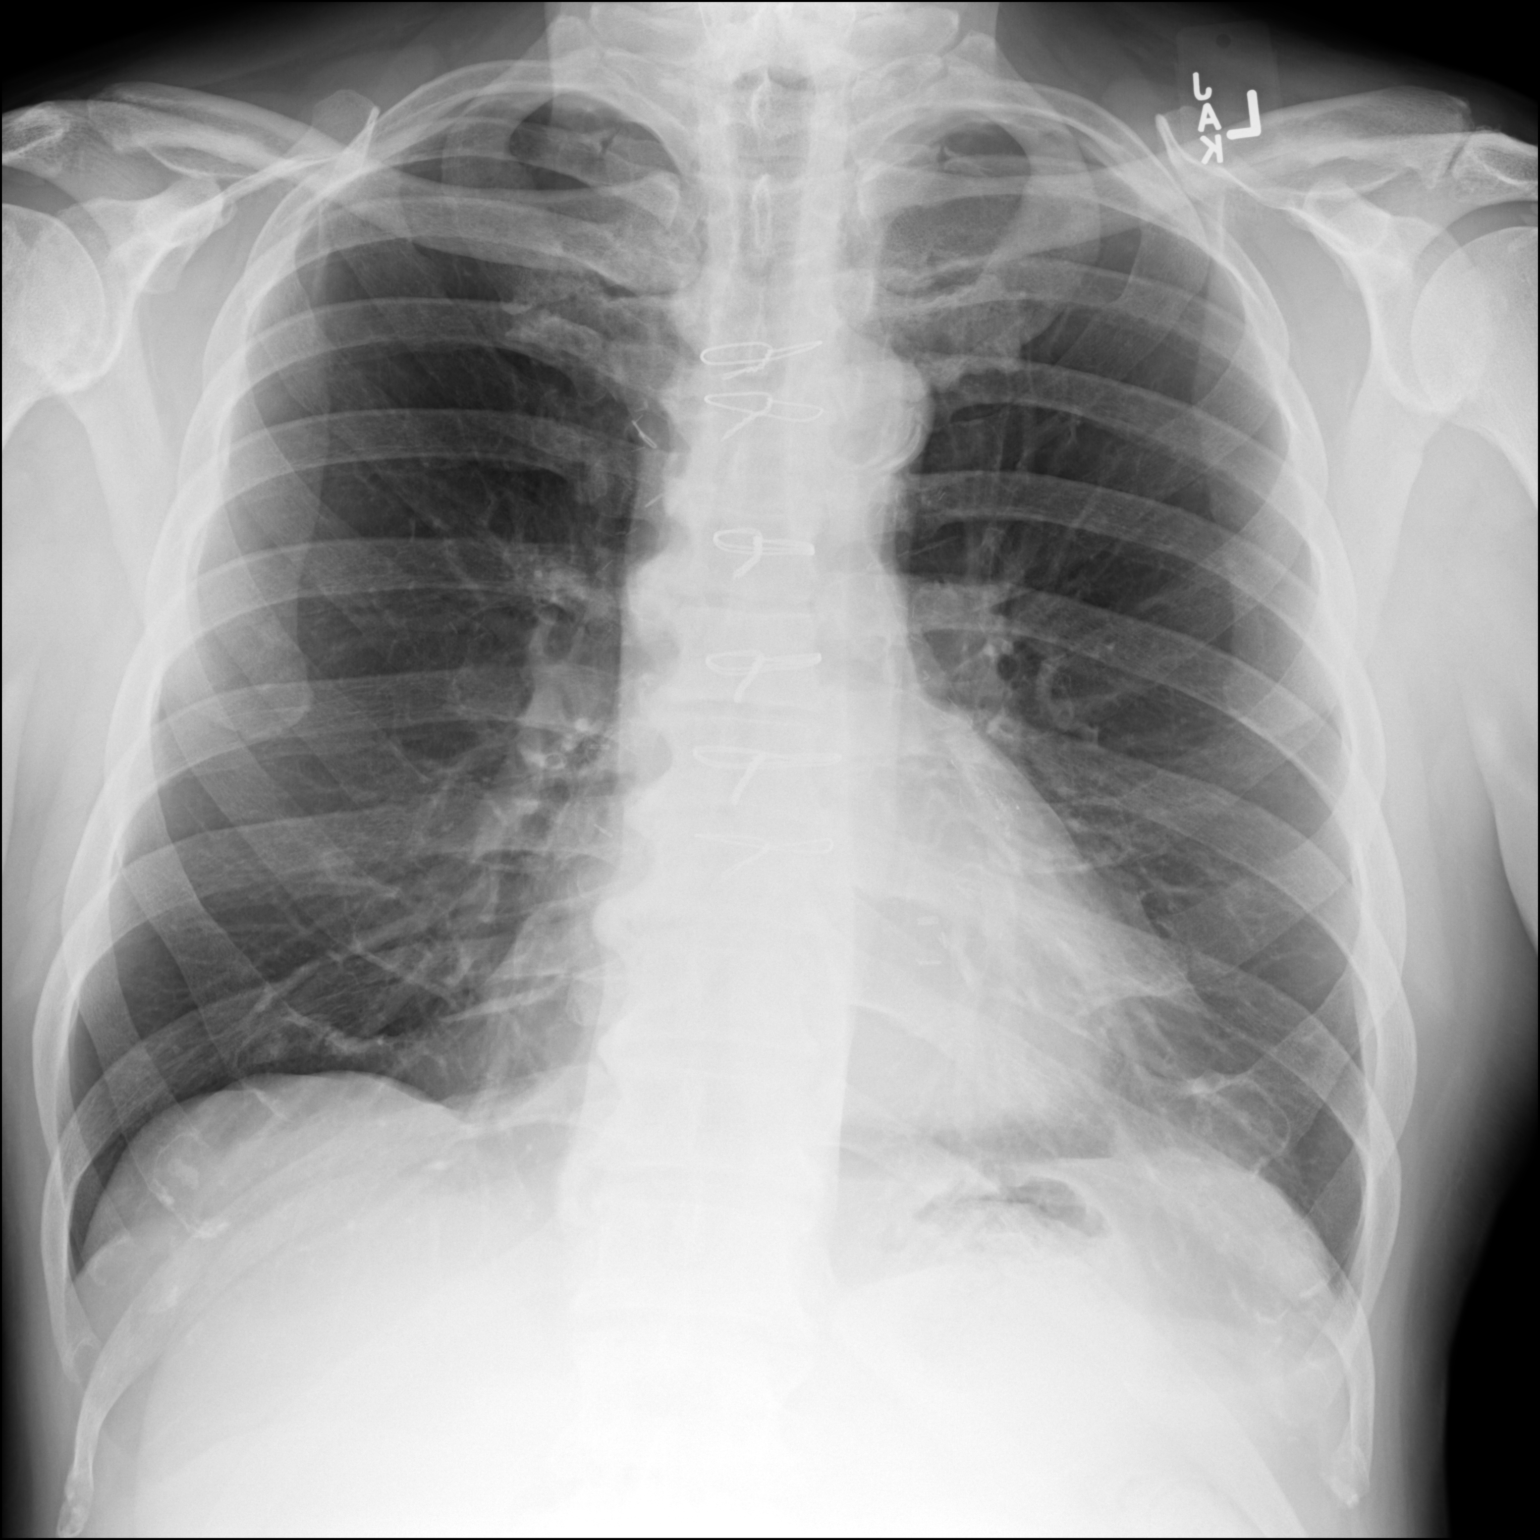

[dg chest 2 view (2 of 2)]
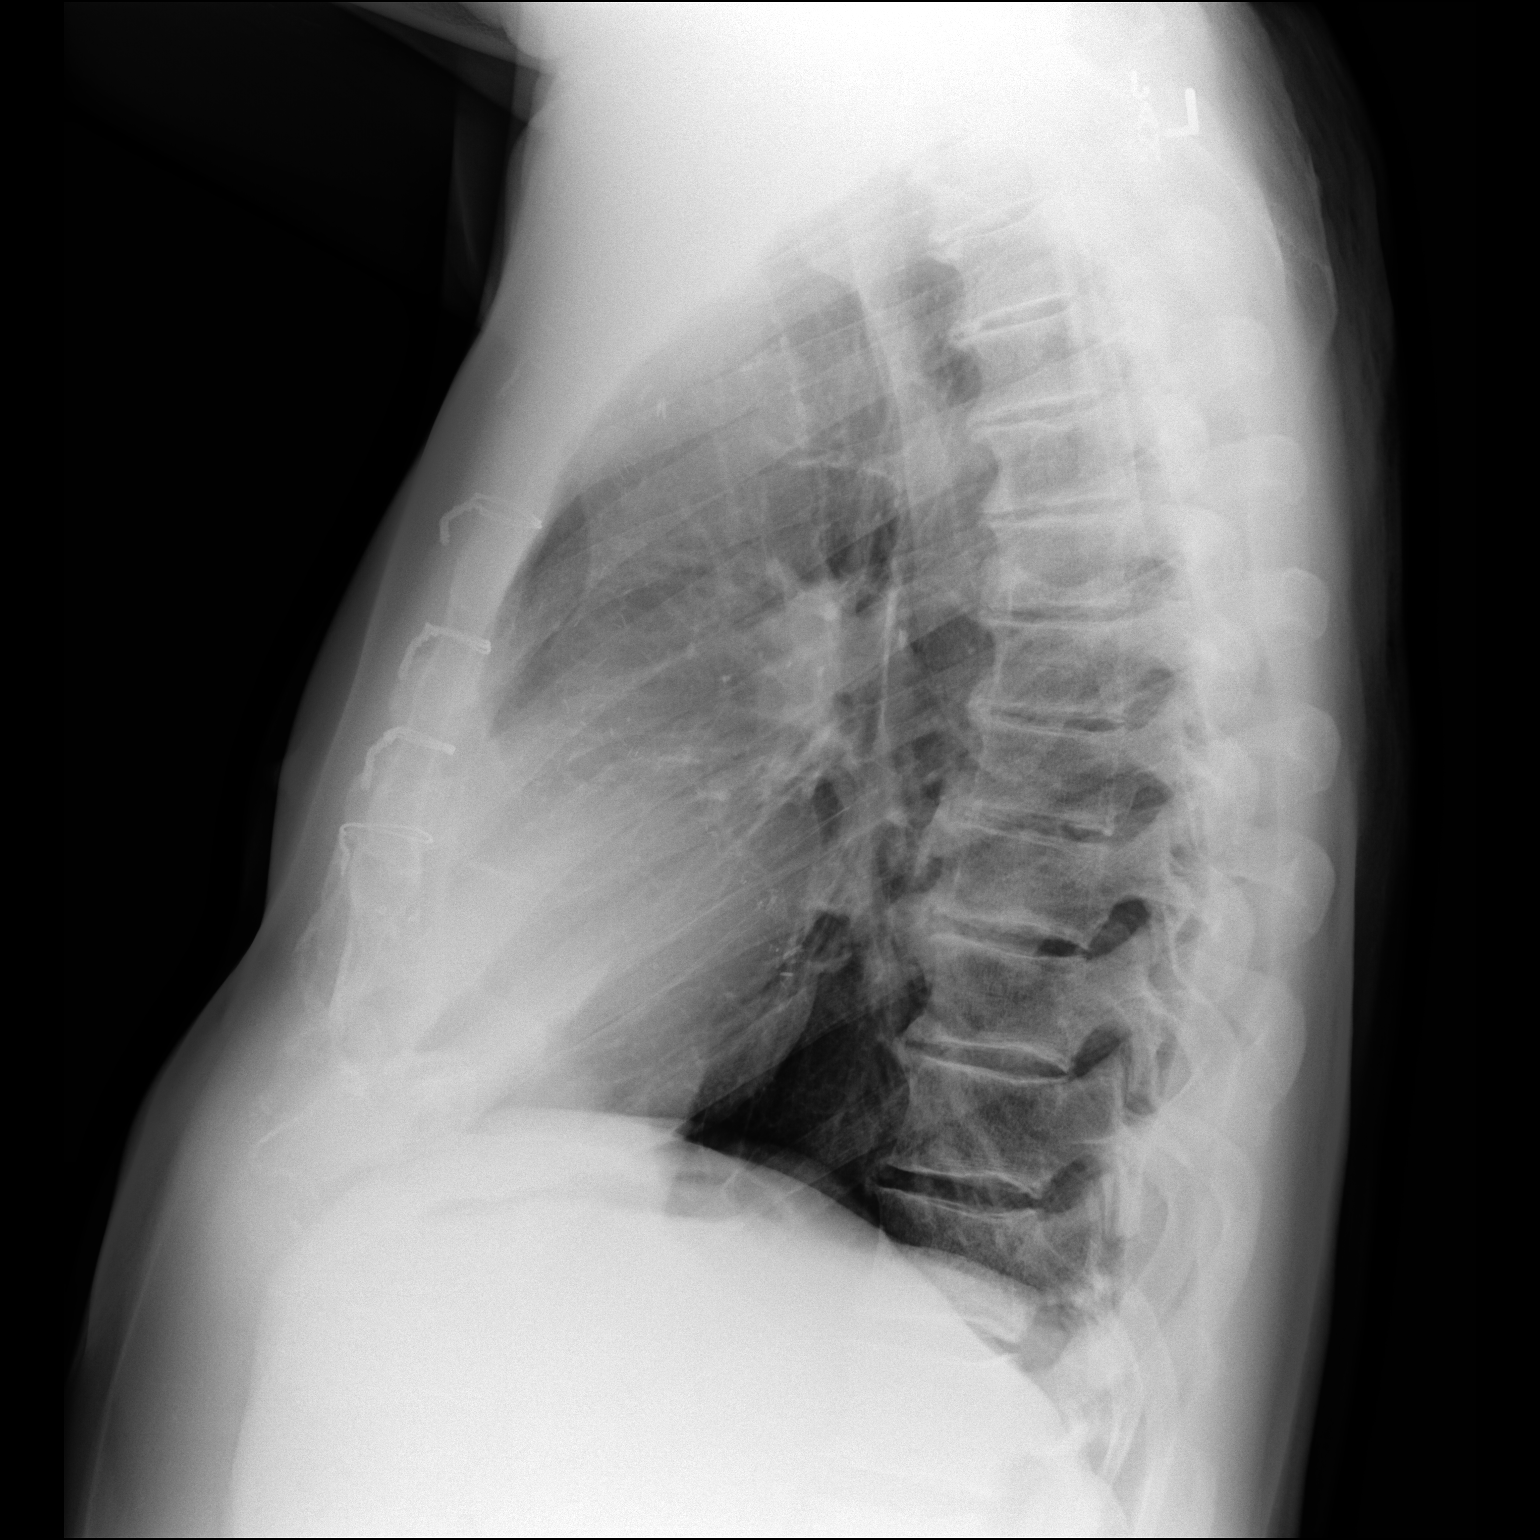

[2 of 2 positions shown; findings below may reference images not displayed]

FINDINGS: The heart size and mediastinal contours are within normal limits.
Atherosclerosis of thoracic aorta is noted. Sternotomy wires are
noted. No pneumothorax or pleural effusion is noted. Both lungs are
clear. The visualized skeletal structures are unremarkable.
IMPRESSION: No active cardiopulmonary disease.  Aortic atherosclerosis.

## 2019-10-09 ENCOUNTER — Other Ambulatory Visit: Payer: Self-pay

## 2019-10-09 MED ORDER — METOPROLOL TARTRATE 25 MG PO TABS: 25.0000 mg | ORAL_TABLET | Freq: Two times a day (BID) | ORAL | 2 refills | Status: DC

## 2019-10-09 MED ORDER — DILTIAZEM HCL ER COATED BEADS 120 MG PO CP24: 120.0000 mg | ORAL_CAPSULE | Freq: Every day | ORAL | 2 refills | Status: DC

## 2019-11-18 ENCOUNTER — Other Ambulatory Visit: Payer: Self-pay

## 2019-11-18 ENCOUNTER — Encounter: Payer: BC Managed Care – PPO | Admitting: *Deleted

## 2019-11-18 VITALS — BP 156/74 | HR 54 | Wt 204.6 lb

## 2019-11-18 DIAGNOSIS — Z006 Encounter for examination for normal comparison and control in clinical research program: Secondary | ICD-10-CM

## 2019-11-18 NOTE — Research (Signed)
Patient was seen in clinic today for Clear Visit T10, M24. No AE's or SAE's to report. New IP medication dispensed and next appointment made.

## 2020-01-23 NOTE — Addendum Note (Signed)
Addended by: Gustavo Lah C on: 01/23/2020 02:31 PM   Modules accepted: Orders

## 2020-03-02 ENCOUNTER — Telehealth: Payer: Self-pay | Admitting: Cardiovascular Disease

## 2020-03-02 MED ORDER — NITROGLYCERIN 0.4 MG SL SUBL: 0.4000 mg | SUBLINGUAL_TABLET | SUBLINGUAL | 1 refills | Status: DC | PRN

## 2020-03-02 NOTE — Telephone Encounter (Signed)
Pt's medication was sent to pt's pharmacy as requested. Confirmation received.  °

## 2020-03-02 NOTE — Telephone Encounter (Signed)
   *  STAT* If patient is at the pharmacy, call can be transferred to refill team.   1. Which medications need to be refilled? (please list name of each medication and dose if known)   nitroGLYCERIN (NITROSTAT) 0.4 MG SL tablet    2. Which pharmacy/location (including street and city if local pharmacy) is medication to be sent to? [facility] - [place], [place] - [address]  3. Do they need a 30 day or 90 day supply? 1 bottle  

## 2020-03-24 ENCOUNTER — Telehealth: Payer: Self-pay | Admitting: *Deleted

## 2020-03-24 DIAGNOSIS — Z006 Encounter for examination for normal comparison and control in clinical research program: Secondary | ICD-10-CM

## 2020-03-24 NOTE — Telephone Encounter (Signed)
I called patient for T-11-M 27 month phone call for Clear Study visit . Left patient message to call me back

## 2020-04-02 ENCOUNTER — Encounter: Payer: Self-pay | Admitting: *Deleted

## 2020-04-02 NOTE — Progress Notes (Signed)
Unable to get in touch with CLEAR research study patient, completed a medical records check for Visit T11, M27. Next in-clinic appointment is scheduled for December 20th, 2021 @ 0800.

## 2020-04-16 ENCOUNTER — Ambulatory Visit (INDEPENDENT_AMBULATORY_CARE_PROVIDER_SITE_OTHER): Payer: Medicare Other | Admitting: Neurology

## 2020-04-16 ENCOUNTER — Telehealth: Payer: Self-pay | Admitting: Neurology

## 2020-04-16 ENCOUNTER — Encounter: Payer: Self-pay | Admitting: Neurology

## 2020-04-16 VITALS — BP 142/70 | HR 56 | Ht 74.0 in | Wt 215.5 lb

## 2020-04-16 DIAGNOSIS — R202 Paresthesia of skin: Secondary | ICD-10-CM | POA: Diagnosis not present

## 2020-04-16 DIAGNOSIS — R269 Unspecified abnormalities of gait and mobility: Secondary | ICD-10-CM

## 2020-04-16 DIAGNOSIS — Z9889 Other specified postprocedural states: Secondary | ICD-10-CM | POA: Diagnosis not present

## 2020-04-16 NOTE — Progress Notes (Signed)
Chief Complaint  Patient presents with  . New Patient (Initial Visit)    Reports worsening neuropathy in his bilateral feet that radiates up into his legs. He is taking gabapentin 300mg , one capsule at bedtime but it is not very helpful. He has previously tried daytime doses of gabapentin. He was working at the time and it caused daytime drowsiness. He is now retired. He also has low back pain.   . Orthopaedics    - referring provider  . PCP    Alfredo Martinez, [name]    HISTORICAL  Derrick [name] is a 64 year old male, seen in request by orthopedics PA 69 and primary care nurse Su Hoff for evaluation of worsening bilateral lower extremity paresthesia, initial evaluation was on April 16, 2020.  I reviewed and summarized the referring note. PMHX HTN HLD Chronic Low back pain Chronic insomnia. CAD, s/p CABG 2018 Cervical fusion, 3 times, in 2004, neck pain, trouble walking, 2nd 2007, recurrent neck pain, left cervical radiculopathy, 3rd 2009, fused,  Peripheral vascular disease.  Patient had a history of bilateral saphenous vein salvation for his CABG, peripheral vascular disease, he just retired, he denies difficulty ambulating, he has to wear steel toed, work on concrete floor, heavy lifting up to 70 pounds.  Since 2011, he noticed left leg numbness, initially involving left medial leg around left saphenous vein salvation site, gradually getting worse, began to involving right lower extremity, and bilateral foot, over the years, symptoms only progress mildly, he denies significant gait abnormality, no upper extremity involvement, no bowel and bladder incontinence  He was started on gabapentin 300 mg every night few weeks ago, denies significant improvement.  MRI of lumbar spine April 07, 2019 from [facility]: Multilevel degenerative changes moderate to severe right and moderate left L4-5 neural foraminal narrowing, with mild  impingement and displacement of existing right L4 nerve roots, and the contact of the exiting left L4 nerve roots.  Minimum retrolisthesis of L5 on S1, severe bulging disc-osteophyte complex asymmetric to the left, mild bilateral facet arthrosis, severe disc height loss, mild impingement and displacement of the descending left S1 nerve roots, and the contact of the descending right S1 nerve root sheaths without displacement.  Moderate bilateral neural foraminal narrowing with contact of the existing bilateral L5 nerve roots.  REVIEW OF SYSTEMS: Full 14 system review of systems performed and notable only for as above All other review of systems were negative.  ALLERGIES: Allergies  Allergen Reactions  . Codeine Itching  . Atorvastatin Other (See Comments)    Muscle aches Muscle aches  . Crestor [Rosuvastatin Calcium]     Muscle aches  . Meloxicam Other (See Comments)    Can not take due to kidneys  . Statins Other (See Comments)    myalgia    HOME MEDICATIONS: Current Outpatient Medications  Medication Sig Dispense Refill  . AMBULATORY NON FORMULARY MEDICATION Take 180 mg by mouth 1 day or 1 dose. [name]    . aspirin EC 81 MG tablet Take 1 tablet (81 mg total) by mouth daily.    . calcitRIOL (ROCALTROL) 0.5 MCG capsule Take 0.5 mcg by mouth daily.    . calcium carbonate (OSCAL) 1500 (600 Ca) MG TABS tablet Take 600 mg of elemental calcium 2 (two) times daily with a meal by mouth. Patient takes 2 in the morning and 1 in the evening.    . Cyanocobalamin (VITAMIN B-12) 2500 MCG SUBL  Place 2,500 mcg under the tongue daily.    Marland Kitchen. diltiazem (CARDIZEM CD) 120 MG 24 hr capsule Take 1 capsule (120 mg total) by mouth daily. 90 capsule 2  . ezetimibe (ZETIA) 10 MG tablet Take 10 mg by mouth daily.      . famotidine (PEPCID) 20 MG tablet Take 20 mg by mouth 2 (two) times daily.    . fenofibrate 160 MG tablet Take 160 mg by mouth daily.    Marland Kitchen. gabapentin  (NEURONTIN) 300 MG capsule Take 300 mg by mouth at bedtime.    Marland Kitchen. HYDROcodone-acetaminophen (NORCO/VICODIN) 5-325 MG tablet Take 1 tablet by mouth every 6 (six) hours as needed for moderate pain.    Marland Kitchen. ipratropium (ATROVENT) 0.03 % nasal spray Place 1 spray into both nostrils as needed.    Marland Kitchen. losartan (COZAAR) 100 MG tablet Take 100 mg by mouth every evening.     Marland Kitchen. MAGNESIUM PO Take 250 mg by mouth daily.    . metoprolol tartrate (LOPRESSOR) 25 MG tablet Take 1 tablet (25 mg total) by mouth 2 (two) times daily. 180 tablet 2  . neomycin-polymyxin-hydrocortisone (CORTISPORIN) 3.5-10000-1 OTIC suspension Place 3 drops into the left ear as needed.    . nitroGLYCERIN (NITROSTAT) 0.4 MG SL tablet Place 1 tablet (0.4 mg total) under the tongue every 5 (five) minutes as needed for chest pain. 75 tablet 1  . Omega-3 Fatty Acids (OMEGA-3 EPA FISH OIL PO) Take 2 tablets by mouth 2 (two) times daily.     . tamsulosin (FLOMAX) 0.4 MG CAPS capsule Take 0.4 mg by mouth daily.    . traZODone (DESYREL) 50 MG tablet Take 50 mg by mouth at bedtime.     No current facility-administered medications for this visit.    PAST MEDICAL HISTORY: Past Medical History:  Diagnosis Date  . Coronary artery disease 2006   stents x2=LAD LCX; totally occluded RCA b. CABG 03/02/17 x2 (left internal mammary artery to LAD, Y-graft right mammary from left mammary to OM).  . Dyslipidemia   . GERD (gastroesophageal reflux disease)   . History of bronchitis   . Hyperlipidemia    statin intolerant  . Hyperparathyroidism   . Hypertension   . Insomnia   . Metatarsalgia   . Neuropathy   . OA (osteoarthritis)   . Peripheral vascular disease (HCC)   . Seasonal allergies   . Urinary frequency   . Wears glasses     PAST SURGICAL HISTORY: Past Surgical History:  Procedure Laterality Date  . CARDIAC CATHETERIZATION  10/28/2004   circ and mid LAD chyper stents  . CERVICAL FUSION  [id number]  . CERVICAL SPINE SURGERY  2004  .  COLONOSCOPY    . CORONARY ANGIOPLASTY  2006   stents placed   . CORONARY ARTERY BYPASS GRAFT N/A 03/02/2017   Procedure: CORONARY ARTERY BYPASS GRAFTING (CABG), ON PUMP, TIMES TWO, USING BILATERAL MAMMARY ARTERIES;  Surgeon: Loreli SlotHendrickson, Reese C, MD;  Location: [facility] OR;  Service: Open Heart Surgery;  Laterality: N/A;  . ELBOW ARTHROPLASTY  1994   right  . ENDARTERECTOMY FEMORAL Left 03/17/2015   Procedure: ENDARTERECTOMY COMMON FEMORAL WITH PROFUNDOPLASTY;  Surgeon: Sherren Kernsharles E Fields, MD;  [facility];  Service: Vascular;  Laterality: Left;  . FEMORAL-POPLITEAL BYPASS GRAFT Left 03/17/2015   Procedure: BYPASS GRAFT FEMORAL-POPLITEAL ARTERY-LEFT LEG AND POSTERIOR TIBIAL SEQUENTIAL GRAFT TO BELOW KNEE POPLITEAL ARTERY;  Surgeon: Sherren Kernsharles E Fields, MD;  [facility];  Service: Vascular;  Laterality: Left;  . FEMORAL-POPLITEAL BYPASS GRAFT  Right 05/11/2015   Procedure:  RIGHT FEMORAL-BELOW THE KNEE POPLITEAL ARTERY BYPASS GRAFT USING NON REVERSE RIGHT GREATER SAPHENOUS VEIN;  Surgeon: Sherren Kerns, MD;  Location[facility] OR;  Service: Vascular;  Laterality: Right;  . INCISION AND DRAINAGE ABSCESS POSTERIOR CERVICALSPINE  2009  . INTRAOPERATIVE ARTERIOGRAM Left 03/17/2015   Procedure: INTRA OPERATIVE ARTERIOGRAM X2;  Surgeon: Sherren Kerns, MD;  Location: [facility] OR;  Service: Vascular;  Laterality: Left;  . INTRAOPERATIVE ARTERIOGRAM Right 05/11/2015   Procedure: INTRA OPERATIVE ARTERIOGRAM;  Surgeon: Sherren Kerns, MD;  Location: [facility];  Service: Vascular;  Laterality: Right;  . LEFT HEART CATH AND CORONARY ANGIOGRAPHY N/A 03/01/2017   Procedure: LEFT HEART CATH AND CORONARY ANGIOGRAPHY;  Surgeon: Kathleene Hazel, MD;  Location: [facility] INVASIVE CV LAB;  Service: Cardiovascular;  Laterality: N/A;  . PATCH ANGIOPLASTY Left 03/17/2015   Procedure: VEIN PATCH ANGIOPLASTY COMMON FEMORAL ARTERY;  Surgeon: Sherren Kerns, MD;  Location: [facility] OR;  Service: Vascular;  Laterality: Left;  . PERIPHERAL  VASCULAR CATHETERIZATION N/A 01/16/2015   Procedure: Abdominal Aortogram;  Surgeon: Sherren Kerns, MD;  Location: [facility] INVASIVE CV LAB;  Service: Cardiovascular;  Laterality: N/A;  . SHOULDER OPEN ROTATOR CUFF REPAIR Right 04/23/2013   Procedure: RIGHT TWO TENDON ROTATOR CUFF REPAIR  SUBACROMIAL DECOMPRESSION AND OPEN DISTAL CLAVICLE RESECTION;  Surgeon: Wyn Forster., MD;  Location: [facility];  Service: Orthopedics;  Laterality: Right;  . stents placed   2006  . TEE WITHOUT CARDIOVERSION N/A 03/02/2017   Procedure: TRANSESOPHAGEAL ECHOCARDIOGRAM (TEE);  Surgeon: Loreli Slot, MD;  Location: [facility] OR;  Service: Open Heart Surgery;  Laterality: N/A;  . VEIN HARVEST Right 05/11/2015   Procedure: WITH NON REVERSE RIGHT GREATER SAPHENOUS VEIN HARVEST;  Surgeon: Sherren Kerns, MD;  Location: [facility] OR;  Service: Vascular;  Laterality: Right;    FAMILY HISTORY: Family History  Problem Relation Age of Onset  . Arthritis Mother   . Varicose Veins Mother   . Lung cancer Father   . COPD Father        Lung  . Cancer Brother        Lukemia  . COPD Brother   . Cancer Brother        Prostate    SOCIAL HISTORY: Social History   Socioeconomic History  . Marital status: Married    Spouse name: Not on file  . Number of children: 1  . Years of education: 78  . Highest education level: High school graduate  Occupational History  . Occupation: Retired  Tobacco Use  . Smoking status: Former Smoker    Types: Cigarettes    Quit date: 05/30/1989    Years since quitting: 30.9  . Smokeless tobacco: Never Used  Vaping Use  . Vaping Use: Never used  Substance and Sexual Activity  . Alcohol use: Yes    Comment: 6 beers per month  . Drug use: Never  . Sexual activity: Not on file  Other Topics Concern  . Not on file  Social History Narrative   Lives with his wife.   One stepson.   Left-handed.   One cup caffeine per week.    Social Determinants of Health    Financial Resource Strain:   . Difficulty of Paying Living Expenses: Not on file  Food Insecurity:   . Worried About Programme researcher, broadcasting/film/video in the Last Year: Not on file  . Ran Out of Food in the Last Year: Not  on file  Transportation Needs:   . Lack of Transportation (Medical): Not on file  . Lack of Transportation (Non-Medical): Not on file  Physical Activity:   . Days of Exercise per Week: Not on file  . Minutes of Exercise per Session: Not on file  Stress:   . Feeling of Stress : Not on file  Social Connections:   . Frequency of Communication with Friends and Family: Not on file  . Frequency of Social Gatherings with Friends and Family: Not on file  . Attends Religious Services: Not on file  . Active Member of Clubs or Organizations: Not on file  . Attends Banker Meetings: Not on file  . Marital Status: Not on file  Intimate Partner Violence:   . Fear of Current or Ex-Partner: Not on file  . Emotionally Abused: Not on file  . Physically Abused: Not on file  . Sexually Abused: Not on file     PHYSICAL EXAM   Vitals:   04/16/20 0909  BP: (!) 142/70  Pulse: (!) 56  Weight: 215 lb 8 oz (97.8 kg)  Height: 6\' 2"  (1.88 m)   Not recorded     Body mass index is 27.67 kg/m.  PHYSICAL EXAMNIATION:  Gen: NAD, conversant, well nourised, well groomed                     Cardiovascular: Regular rate rhythm, no peripheral edema, warm, nontender. Eyes: Conjunctivae clear without exudates or hemorrhage Neck: Supple, no carotid bruits. Pulmonary: Clear to auscultation bilaterally   NEUROLOGICAL EXAM:  MENTAL STATUS: Speech:    Speech is normal; fluent and spontaneous with normal comprehension.  Cognition:     Orientation to time, place and person     Normal recent and remote memory     Normal Attention span and concentration     Normal Language, naming, repeating,spontaneous speech     Fund of knowledge   CRANIAL NERVES: CN II: Visual fields are full  to confrontation. Pupils are round equal and briskly reactive to light. CN III, IV, VI: extraocular movement are normal. No ptosis. CN V: Facial sensation is intact to light touch CN VII: Face is symmetric with normal eye closure  CN VIII: Hearing is normal to causal conversation. CN IX, X: Phonation is normal. CN XI: Head turning and shoulder shrug are intact  MOTOR: There is no pronator drift of out-stretched arms. Muscle bulk and tone are normal. Muscle strength is normal.  REFLEXES: Reflexes are 2+ and symmetric at the biceps, triceps,3/3 knees, and ankles. Plantar responses are flexor.  SENSORY: Length dependent decreased vibratory sensation, pinprick, well-healed bilateral saphenous vein salvation scar,  COORDINATION: There is no trunk or limb dysmetria noted.  GAIT/STANCE: He can get up from seated position arm crossed, steady, able to stand up on tiptoe, heels, Romberg sign was negative,  DIAGNOSTIC DATA (LABS, IMAGING, TESTING) - I reviewed patient records, labs, notes, testing and imaging myself where available.   ASSESSMENT AND PLAN  Derrick [name] is a 64 y.o. male   More than 10 years history of slow worsening bilateral lower extremity paresthesia, starting from bilateral lower extremity, now involving bilateral foot, occasionally bilateral lower extremity buckled underneath him History of cervical decompression surgery, 3 times, most recent one was 2009, for cervical myelopathy, cervical radiculopathy. Had a history of CABG surgery, peripheral vascular disease, femoral artery bypass surgery, salvage of bilateral saphenous vein,  On examination, he has mild length dependent sensory changes,  left worse than right, more towards the previous saphenous vein salvage site, hyperreflexia especially bilateral knee, steady gait,  Differentiation diagnosis include residual symptoms from cervical myelopathy, need to rule out peripheral neuropathy  MRI of cervical spine  EMG  nerve conduction study,   Levert Feinstein, M.D. Ph.D.  [facility] [address] [place], [place] [address] Ph: [phone] Fax: [phone]  CC:  Su Hoff, PA-C [address] [place],  [place] [address]  Soundra Pilon, [name]

## 2020-04-16 NOTE — Telephone Encounter (Signed)
Medicare/humana supp order sent to GI. No auth they will reach out to the patient to schedule.  

## 2020-04-21 ENCOUNTER — Ambulatory Visit
Admission: RE | Admit: 2020-04-21 | Discharge: 2020-04-21 | Disposition: A | Discharge status: Home / Self Care | Payer: Medicare Other | Source: Ambulatory Visit | Attending: Neurology | Admitting: Neurology

## 2020-04-21 DIAGNOSIS — Z9889 Other specified postprocedural states: Secondary | ICD-10-CM

## 2020-04-21 DIAGNOSIS — R202 Paresthesia of skin: Secondary | ICD-10-CM

## 2020-04-21 DIAGNOSIS — R269 Unspecified abnormalities of gait and mobility: Secondary | ICD-10-CM

## 2020-04-22 ENCOUNTER — Other Ambulatory Visit: Payer: Self-pay

## 2020-04-22 MED ORDER — DILTIAZEM HCL ER COATED BEADS 120 MG PO CP24: 120.0000 mg | ORAL_CAPSULE | Freq: Every day | ORAL | 0 refills | Status: DC

## 2020-04-27 ENCOUNTER — Telehealth: Payer: Self-pay | Admitting: Neurology

## 2020-04-27 NOTE — Telephone Encounter (Signed)
IMPRESSION:   MRI cervical spine (without) demonstrating: - At C7-T1: rightward disc protrusion with moderate right foraminal stenosis. - At C2-3: disc bulging and uncovertebral joint hypertrophy with mild left foraminal stenosis. - Multi-level cervical discectomy fusion C3-C7 levels with metallic hardware.   Please call patient, MRI of cervical spine showed multilevel degenerative changes, cervical discectomy fusion C3-7 with metallic hardware,  There was evidence of mild canal stenosis, but no spinal cord signal abnormalities, I will review films with him at next follow-up visit.

## 2020-04-27 NOTE — Telephone Encounter (Signed)
I spoke to the patient and provided him with the results of his cervical MRI. He will keep his pending NCV/EMG on 04/29/20. He is aware that the scan will be further reviewed at that time.

## 2020-04-29 ENCOUNTER — Ambulatory Visit (INDEPENDENT_AMBULATORY_CARE_PROVIDER_SITE_OTHER): Payer: Medicare Other | Admitting: Neurology

## 2020-04-29 DIAGNOSIS — R202 Paresthesia of skin: Secondary | ICD-10-CM

## 2020-04-29 DIAGNOSIS — Z9889 Other specified postprocedural states: Secondary | ICD-10-CM | POA: Diagnosis not present

## 2020-04-29 DIAGNOSIS — I200 Unstable angina: Secondary | ICD-10-CM

## 2020-04-29 DIAGNOSIS — R269 Unspecified abnormalities of gait and mobility: Secondary | ICD-10-CM | POA: Diagnosis not present

## 2020-04-29 NOTE — Progress Notes (Signed)
No chief complaint on file.   HISTORICAL  Derrick [name] is a 64 year old male, seen in request by orthopedics PA Su Hoff and primary care nurse Jim Like for evaluation of worsening bilateral lower extremity paresthesia, initial evaluation was on April 16, 2020.  I reviewed and summarized the referring note. PMHX HTN HLD Chronic Low back pain Chronic insomnia. CAD, s/p CABG 2018 Cervical fusion, 3 times, in 2004, neck pain, trouble walking, 2nd 2007, recurrent neck pain, left cervical radiculopathy, 3rd 2009, fused,  Peripheral vascular disease.  Patient had a history of bilateral saphenous vein salvation for his CABG, peripheral vascular disease, he just retired, he denies difficulty ambulating, he has to wear steel toed, work on concrete floor, heavy lifting up to 70 pounds.  Since 2011, he noticed left leg numbness, initially involving left medial leg around left saphenous vein salvation site, gradually getting worse, began to involving right lower extremity, and bilateral foot, over the years, symptoms only progress mildly, he denies significant gait abnormality, no upper extremity involvement, no bowel and bladder incontinence  He was started on gabapentin 300 mg every night few weeks ago, denies significant improvement.  MRI of lumbar spine April 07, 2019 from [facility]: Multilevel degenerative changes moderate to severe right and moderate left L4-5 neural foraminal narrowing, with mild impingement and displacement of existing right L4 nerve roots, and the contact of the exiting left L4 nerve roots.  Minimum retrolisthesis of L5 on S1, severe bulging disc-osteophyte complex asymmetric to the left, mild bilateral facet arthrosis, severe disc height loss, mild impingement and displacement of the descending left S1 nerve roots, and the contact of the descending right S1 nerve root sheaths without displacement.  Moderate bilateral neural foraminal  narrowing with contact of the existing bilateral L5 nerve roots.  UPDATE Apr 29 2020: Patient return for electrodiagnostic study today, which was normal, there was no evidence of large fiber peripheral neuropathy or bilateral lumbosacral radiculopathy  He continue complains of left medial, anterior leg numbness, also numbness at bilateral toes, recently he was treated by orthopedic surgeon for right-sided low back pain, radiating pain to right knee, felt unsteady at his right leg sometimes  We personally reviewed MRI cervical spine November 2021: C7-T1, rightward disc protrusion with moderate right foraminal stenosis, multilevel cervical fusion C3-7, with metallic hardware, C2 and 3, disc bulging, uncovertebral joint hypertrophy with mild left foraminal stenosis  He has been treated with Norco 5/325 as needed, which has been helpful   REVIEW OF SYSTEMS: Full 14 system review of systems performed and notable only for as above All other review of systems were negative.  ALLERGIES: Allergies  Allergen Reactions  . Codeine Itching  . Atorvastatin Other (See Comments)    Muscle aches Muscle aches  . Crestor [Rosuvastatin Calcium]     Muscle aches  . Meloxicam Other (See Comments)    Can not take due to kidneys  . Statins Other (See Comments)    myalgia    HOME MEDICATIONS: Current Outpatient Medications  Medication Sig Dispense Refill  . AMBULATORY NON FORMULARY MEDICATION Take 180 mg by mouth 1 day or 1 dose. [name]    . aspirin EC 81 MG tablet Take 1 tablet (81 mg total) by mouth daily.    . calcitRIOL (ROCALTROL) 0.5 MCG capsule Take 0.5 mcg by mouth daily.    . calcium carbonate (OSCAL) 1500 (600 Ca) MG TABS tablet Take 600 mg of elemental calcium 2 (two)  times daily with a meal by mouth. Patient takes 2 in the morning and 1 in the evening.    . Cyanocobalamin (VITAMIN B-12) 2500 MCG SUBL Place 2,500 mcg under the tongue daily.    Marland Kitchen  diltiazem (CARDIZEM CD) 120 MG 24 hr capsule Take 1 capsule (120 mg total) by mouth daily. Please keep upcoming appt in February 2022 with Dr. Elease Hashimoto before anymore refills. Thank you 90 capsule 0  . ezetimibe (ZETIA) 10 MG tablet Take 10 mg by mouth daily.      . famotidine (PEPCID) 20 MG tablet Take 20 mg by mouth 2 (two) times daily.    . fenofibrate 160 MG tablet Take 160 mg by mouth daily.    Marland Kitchen gabapentin (NEURONTIN) 300 MG capsule Take 300 mg by mouth at bedtime.    Marland Kitchen HYDROcodone-acetaminophen (NORCO/VICODIN) 5-325 MG tablet Take 1 tablet by mouth every 6 (six) hours as needed for moderate pain.    Marland Kitchen ipratropium (ATROVENT) 0.03 % nasal spray Place 1 spray into both nostrils as needed.    Marland Kitchen losartan (COZAAR) 100 MG tablet Take 100 mg by mouth every evening.     Marland Kitchen MAGNESIUM PO Take 250 mg by mouth daily.    . metoprolol tartrate (LOPRESSOR) 25 MG tablet Take 1 tablet (25 mg total) by mouth 2 (two) times daily. 180 tablet 2  . neomycin-polymyxin-hydrocortisone (CORTISPORIN) 3.5-10000-1 OTIC suspension Place 3 drops into the left ear as needed.    . nitroGLYCERIN (NITROSTAT) 0.4 MG SL tablet Place 1 tablet (0.4 mg total) under the tongue every 5 (five) minutes as needed for chest pain. 75 tablet 1  . Omega-3 Fatty Acids (OMEGA-3 EPA FISH OIL PO) Take 2 tablets by mouth 2 (two) times daily.     . tamsulosin (FLOMAX) 0.4 MG CAPS capsule Take 0.4 mg by mouth daily.    . traZODone (DESYREL) 50 MG tablet Take 50 mg by mouth at bedtime.     No current facility-administered medications for this visit.    PAST MEDICAL HISTORY: Past Medical History:  Diagnosis Date  . Coronary artery disease 2006   stents x2=LAD LCX; totally occluded RCA b. CABG 03/02/17 x2 (left internal mammary artery to LAD, Y-graft right mammary from left mammary to OM).  . Dyslipidemia   . GERD (gastroesophageal reflux disease)   . History of bronchitis   . Hyperlipidemia    statin intolerant  . Hyperparathyroidism   .  Hypertension   . Insomnia   . Metatarsalgia   . Neuropathy   . OA (osteoarthritis)   . Peripheral vascular disease (HCC)   . Seasonal allergies   . Urinary frequency   . Wears glasses     PAST SURGICAL HISTORY: Past Surgical History:  Procedure Laterality Date  . CARDIAC CATHETERIZATION  10/28/2004   circ and mid LAD chyper stents  . CERVICAL FUSION  [id number]  . CERVICAL SPINE SURGERY  2004  . COLONOSCOPY    . CORONARY ANGIOPLASTY  2006   stents placed   . CORONARY ARTERY BYPASS GRAFT N/A 03/02/2017   Procedure: CORONARY ARTERY BYPASS GRAFTING (CABG), ON PUMP, TIMES TWO, USING BILATERAL MAMMARY ARTERIES;  Surgeon: Loreli Slot, MD;  Location: [facility] OR;  Service: Open Heart Surgery;  Laterality: N/A;  . ELBOW ARTHROPLASTY  1994   right  . ENDARTERECTOMY FEMORAL Left 03/17/2015   Procedure: ENDARTERECTOMY COMMON FEMORAL WITH PROFUNDOPLASTY;  Surgeon: Sherren Kerns, MD;  Location[facility] OR;  Service: Vascular;  Laterality: Left;  . FEMORAL-POPLITEAL  BYPASS GRAFT Left 03/17/2015   Procedure: BYPASS GRAFT FEMORAL-POPLITEAL ARTERY-LEFT LEG AND POSTERIOR TIBIAL SEQUENTIAL GRAFT TO BELOW KNEE POPLITEAL ARTERY;  Surgeon: Sherren Kerns, MD;  Location: [facility] OR;  Service: Vascular;  Laterality: Left;  . FEMORAL-POPLITEAL BYPASS GRAFT Right 05/11/2015   Procedure:  RIGHT FEMORAL-BELOW THE KNEE POPLITEAL ARTERY BYPASS GRAFT USING NON REVERSE RIGHT GREATER SAPHENOUS VEIN;  Surgeon: Sherren Kerns, MD;  Location[facility] OR;  Service: Vascular;  Laterality: Right;  . INCISION AND DRAINAGE ABSCESS POSTERIOR CERVICALSPINE  2009  . INTRAOPERATIVE ARTERIOGRAM Left 03/17/2015   Procedure: INTRA OPERATIVE ARTERIOGRAM X2;  Surgeon: Sherren Kerns, MD;  Location: [facility] OR;  Service: Vascular;  Laterality: Left;  . INTRAOPERATIVE ARTERIOGRAM Right 05/11/2015   Procedure: INTRA OPERATIVE ARTERIOGRAM;  Surgeon: Sherren Kerns, MD;  Location: [facility] OR;  Service: Vascular;  Laterality: Right;  . LEFT HEART  CATH AND CORONARY ANGIOGRAPHY N/A 03/01/2017   Procedure: LEFT HEART CATH AND CORONARY ANGIOGRAPHY;  Surgeon: Kathleene Hazel, MD;  Location: [facility] INVASIVE CV LAB;  Service: Cardiovascular;  Laterality: N/A;  . PATCH ANGIOPLASTY Left 03/17/2015   Procedure: VEIN PATCH ANGIOPLASTY COMMON FEMORAL ARTERY;  Surgeon: Sherren Kerns, MD;  Location: [facility] OR;  Service: Vascular;  Laterality: Left;  . PERIPHERAL VASCULAR CATHETERIZATION N/A 01/16/2015   Procedure: Abdominal Aortogram;  Surgeon: Sherren Kerns, MD;  Location: [facility] INVASIVE CV LAB;  Service: Cardiovascular;  Laterality: N/A;  . SHOULDER OPEN ROTATOR CUFF REPAIR Right 04/23/2013   Procedure: RIGHT TWO TENDON ROTATOR CUFF REPAIR  SUBACROMIAL DECOMPRESSION AND OPEN DISTAL CLAVICLE RESECTION;  Surgeon: Wyn Forster., MD;  Location: [facility];  Service: Orthopedics;  Laterality: Right;  . stents placed   2006  . TEE WITHOUT CARDIOVERSION N/A 03/02/2017   Procedure: TRANSESOPHAGEAL ECHOCARDIOGRAM (TEE);  Surgeon: Loreli Slot, MD;  Location: [facility] OR;  Service: Open Heart Surgery;  Laterality: N/A;  . VEIN HARVEST Right 05/11/2015   Procedure: WITH NON REVERSE RIGHT GREATER SAPHENOUS VEIN HARVEST;  Surgeon: Sherren Kerns, MD;  Location: [facility] OR;  Service: Vascular;  Laterality: Right;    FAMILY HISTORY: Family History  Problem Relation Age of Onset  . Arthritis Mother   . Varicose Veins Mother   . Lung cancer Father   . COPD Father        Lung  . Cancer Brother        Lukemia  . COPD Brother   . Cancer Brother        Prostate    SOCIAL HISTORY: Social History   Socioeconomic History  . Marital status: Married    Spouse name: Not on file  . Number of children: 1  . Years of education: 57  . Highest education level: High school graduate  Occupational History  . Occupation: Retired  Tobacco Use  . Smoking status: Former Smoker    Types: Cigarettes    Quit date: 05/30/1989    Years since  quitting: 30.9  . Smokeless tobacco: Never Used  Vaping Use  . Vaping Use: Never used  Substance and Sexual Activity  . Alcohol use: Yes    Comment: 6 beers per month  . Drug use: Never  . Sexual activity: Not on file  Other Topics Concern  . Not on file  Social History Narrative   Lives with his wife.   One stepson.   Left-handed.   One cup caffeine per week.    Social Determinants of Health   Financial  Resource Strain:   . Difficulty of Paying Living Expenses: Not on file  Food Insecurity:   . Worried About Programme researcher, broadcasting/film/videounning Out of Food in the Last Year: Not on file  . Ran Out of Food in the Last Year: Not on file  Transportation Needs:   . Lack of Transportation (Medical): Not on file  . Lack of Transportation (Non-Medical): Not on file  Physical Activity:   . Days of Exercise per Week: Not on file  . Minutes of Exercise per Session: Not on file  Stress:   . Feeling of Stress : Not on file  Social Connections:   . Frequency of Communication with Friends and Family: Not on file  . Frequency of Social Gatherings with Friends and Family: Not on file  . Attends Religious Services: Not on file  . Active Member of Clubs or Organizations: Not on file  . Attends BankerClub or Organization Meetings: Not on file  . Marital Status: Not on file  Intimate Partner Violence:   . Fear of Current or Ex-Partner: Not on file  . Emotionally Abused: Not on file  . Physically Abused: Not on file  . Sexually Abused: Not on file     PHYSICAL EXAM   There were no vitals filed for this visit. Not recorded     There is no height or weight on file to calculate BMI.  PHYSICAL EXAMNIATION:  Gen: NAD, conversant, well nourised, well groomed     NEUROLOGICAL EXAM:  MENTAL STATUS: Speech/cognition Awake, alert, oriented to history taking and casual conversation  CRANIAL NERVES: CN II: Visual fields are full to confrontation. Pupils are round equal and briskly reactive to light. CN III, IV, VI:  extraocular movement are normal. No ptosis. CN V: Facial sensation is intact to light touch CN VII: Face is symmetric with normal eye closure  CN VIII: Hearing is normal to causal conversation. CN IX, X: Phonation is normal. CN XI: Head turning and shoulder shrug are intact  MOTOR: Normal muscle bulk, tone, and strength  REFLEXES: Reflexes are 2+ and symmetric at the biceps, triceps,3/3 knees, and trace at ankles. Plantar responses are flexor.  SENSORY: Length dependent decreased vibratory sensation, pinprick, well-healed bilateral saphenous vein salvation scar,  COORDINATION: There is no trunk or limb dysmetria noted.  GAIT/STANCE: Steady, able to walk on heels, tiptoe. DIAGNOSTIC DATA (LABS, IMAGING, TESTING) - I reviewed patient records, labs, notes, testing and imaging myself where available.   ASSESSMENT AND PLAN  Derrick [name] is a 64 y.o. male   More than 10 years history of slow worsening bilateral lower extremity paresthesia, starting from left median anterior leg, now involving bilateral foot, occasionally bilateral lower extremity History of cervical decompression surgery, 3 times, most recent one was 2009, for cervical myelopathy, cervical radiculopathy. Had a history of CABG surgery, peripheral vascular disease, femoral artery bypass surgery, salvage of bilateral saphenous vein, Lumbar degenerative disc disease,  EMG nerve conduction study was normal, there was no evidence of large fiber peripheral neuropathy, or active bilateral lumbosacral radiculopathy,  Laboratory evaluation for potential etiology of small fiber neuropathy  His complaints of bilateral lower extremity paresthesia likely combination of scar from previous saphenous vein salvation, lumbar degenerative disc disease, right lumbar radiculopathy, residual deficit from cervical myelopathy  Will only return to clinic for new issues,   Levert FeinsteinYijun Laken Lobato, M.D. Ph.D.  [facility] [address], [address] [place], [place] [address] Ph: [phone] Fax: [phone]  CC:  Su HoffKnowles, Carol, PA-C [id number] [place],  [place] [address]  Soundra Pilon, [name]

## 2020-04-29 NOTE — Procedures (Signed)
Full Name: Derrick [name] Gender: Male MRN #: [id number] Date of Birth: 1955/12/14    Visit Date: 04/29/2020 08:07 Age: 64 Years Examining Physician: Levert Feinstein, MD  Referring Physician: Levert Feinstein, MD History: 64 year old male presented with bilateral lower extremity paresthesia  Summary of the test: Nerve conduction study: Bilateral sural, superficial peroneal sensory responses were normal.  Bilateral peroneal to EDB and tibial motor responses were normal.  Electromyography: Selected needle examination of bilateral lower extremity muscles and bilateral lumbosacral paraspinal muscles were normal.  Conclusion: This is a normal study.  There is no electrodiagnostic evidence of large fiber peripheral neuropathy or bilateral lumbosacral radiculopathy.    ------------------------------- Levert Feinstein, M.D. PhD  [facility] [address] [place], [place] [address] Tel: [phone] Fax: [phone]  Verbal informed consent was obtained from the patient, patient was informed of potential risk of procedure, including bruising, bleeding, hematoma formation, infection, muscle weakness, muscle pain, numbness, among others.         MNC    Nerve / Sites Muscle Latency Ref. Amplitude Ref. Rel Amp Segments Distance Velocity Ref. Area    ms ms mV mV %  cm m/s m/s mVms  L Peroneal - EDB     Ankle EDB 4.9 ?6.5 3.1 ?2.0 100 Ankle - EDB 9   10.5     Fib head EDB 11.5  2.4  76.9 Fib head - Ankle 29 44 ?44 9.6     Pop fossa EDB 13.7  2.5  102 Pop fossa - Fib head 10 44 ?44 11.1         Pop fossa - Ankle      R Peroneal - EDB     Ankle EDB 4.9 ?6.5 3.9 ?2.0 100 Ankle - EDB 9   13.3     Fib head EDB 11.5  3.1  79.3 Fib head - Ankle 29 44 ?44 12.5     Pop fossa EDB 13.8  3.2  105 Pop fossa - Fib head 10 44 ?44 13.4         Pop fossa - Ankle      L Tibial - AH     Ankle AH 4.8 ?5.8 4.9 ?4.0 100 Ankle - AH 9   13.0     Pop fossa AH 15.4  1.5  31.6 Pop fossa - Ankle 43 41  ?41 6.2  R Tibial - AH     Ankle AH 5.4 ?5.8 5.9 ?4.0 100 Ankle - AH 9   13.5     Pop fossa AH 15.1  4.0  67 Pop fossa - Ankle 40 41 ?41 12.3             SNC    Nerve / Sites Rec. Site Peak Lat Ref.  Amp Ref. Segments Distance    ms ms V V  cm  L Sural - Ankle (Calf)     Calf Ankle 4.0 ?4.4 6 ?6 Calf - Ankle 14  R Sural - Ankle (Calf)     Calf Ankle 3.4 ?4.4 7 ?6 Calf - Ankle 14  L Superficial peroneal - Ankle     Lat leg Ankle 4.1 ?4.4 6 ?6 Lat leg - Ankle 14  R Superficial peroneal - Ankle     Lat leg Ankle 4.3 ?4.4 8 ?6 Lat leg - Ankle 14             F  Wave    Nerve F Lat Ref.  ms ms  L Tibial - AH 54.6 ?56.0  R Tibial - AH 55.9 ?56.0         EMG Summary Table    Spontaneous MUAP Recruitment  Muscle IA Fib PSW Fasc Other Amp Dur. Poly Pattern  R. Tibialis anterior Normal None None None _______ Normal Normal Normal Normal  R. Tibialis posterior Normal None None None _______ Normal Normal Normal Normal  R. Peroneus longus Normal None None None _______ Normal Normal Normal Normal  R. Gastrocnemius (Medial head) Normal None None None _______ Normal Normal Normal Normal  R. Vastus lateralis Normal None None None _______ Normal Normal Normal Normal  L. Tibialis anterior Normal None None None _______ Normal Normal Normal Normal  L. Tibialis posterior Normal None None None _______ Normal Normal Normal Normal  L. Peroneus longus Normal None None None _______ Normal Normal Normal Normal  L. Vastus lateralis Normal None None None _______ Normal Normal Normal Normal  R. Lumbar paraspinals (low) Normal None None None _______ Normal Normal Normal Normal  R. Lumbar paraspinals (mid) Normal None None None _______ Normal Normal Normal Normal  L. Lumbar paraspinals (low) Normal None None None _______ Normal Normal Normal Normal  L. Lumbar paraspinals (mid) Normal None None None _______ Normal Normal Normal Normal

## 2020-04-29 NOTE — Progress Notes (Signed)
No chief complaint on file.   HISTORICAL  Derrick [name] is a 64 year old male, seen in request by orthopedics PA Su Hoff and primary care nurse Jim Like for evaluation of worsening bilateral lower extremity paresthesia, initial evaluation was on April 16, 2020.  I reviewed and summarized the referring note. PMHX HTN HLD Chronic Low back pain Chronic insomnia. CAD, s/p CABG 2018 Cervical fusion, 3 times, in 2004, neck pain, trouble walking, 2nd 2007, recurrent neck pain, left cervical radiculopathy, 3rd 2009, fused,  Peripheral vascular disease.  Patient had a history of bilateral saphenous vein salvation for his CABG, peripheral vascular disease, he just retired, he denies difficulty ambulating, he has to wear steel toed, work on concrete floor, heavy lifting up to 70 pounds.  Since 2011, he noticed left leg numbness, initially involving left medial leg around left saphenous vein salvation site, gradually getting worse, began to involving right lower extremity, and bilateral foot, over the years, symptoms only progress mildly, he denies significant gait abnormality, no upper extremity involvement, no bowel and bladder incontinence  He was started on gabapentin 300 mg every night few weeks ago, denies significant improvement.  MRI of lumbar spine April 07, 2019 from [facility]: Multilevel degenerative changes moderate to severe right and moderate left L4-5 neural foraminal narrowing, with mild impingement and displacement of existing right L4 nerve roots, and the contact of the exiting left L4 nerve roots.  Minimum retrolisthesis of L5 on S1, severe bulging disc-osteophyte complex asymmetric to the left, mild bilateral facet arthrosis, severe disc height loss, mild impingement and displacement of the descending left S1 nerve roots, and the contact of the descending right S1 nerve root sheaths without displacement.  Moderate bilateral neural foraminal  narrowing with contact of the existing bilateral L5 nerve roots.  REVIEW OF SYSTEMS: Full 14 system review of systems performed and notable only for as above All other review of systems were negative.  ALLERGIES: Allergies  Allergen Reactions  . Codeine Itching  . Atorvastatin Other (See Comments)    Muscle aches Muscle aches  . Crestor [Rosuvastatin Calcium]     Muscle aches  . Meloxicam Other (See Comments)    Can not take due to kidneys  . Statins Other (See Comments)    myalgia    HOME MEDICATIONS: Current Outpatient Medications  Medication Sig Dispense Refill  . AMBULATORY NON FORMULARY MEDICATION Take 180 mg by mouth 1 day or 1 dose. [name]    . aspirin EC 81 MG tablet Take 1 tablet (81 mg total) by mouth daily.    . calcitRIOL (ROCALTROL) 0.5 MCG capsule Take 0.5 mcg by mouth daily.    . calcium carbonate (OSCAL) 1500 (600 Ca) MG TABS tablet Take 600 mg of elemental calcium 2 (two) times daily with a meal by mouth. Patient takes 2 in the morning and 1 in the evening.    . Cyanocobalamin (VITAMIN B-12) 2500 MCG SUBL Place 2,500 mcg under the tongue daily.    Marland Kitchen diltiazem (CARDIZEM CD) 120 MG 24 hr capsule Take 1 capsule (120 mg total) by mouth daily. Please keep upcoming appt in February 2022 with Dr. Elease Hashimoto before anymore refills. Thank you 90 capsule 0  . ezetimibe (ZETIA) 10 MG tablet Take 10 mg by mouth daily.      . famotidine (PEPCID) 20 MG tablet Take 20 mg by mouth 2 (two) times daily.    . fenofibrate 160 MG tablet Take 160 mg by  mouth daily.    Marland Kitchen gabapentin (NEURONTIN) 300 MG capsule Take 300 mg by mouth at bedtime.    Marland Kitchen HYDROcodone-acetaminophen (NORCO/VICODIN) 5-325 MG tablet Take 1 tablet by mouth every 6 (six) hours as needed for moderate pain.    Marland Kitchen ipratropium (ATROVENT) 0.03 % nasal spray Place 1 spray into both nostrils as needed.    Marland Kitchen losartan (COZAAR) 100 MG tablet Take 100 mg by mouth every evening.     Marland Kitchen  MAGNESIUM PO Take 250 mg by mouth daily.    . metoprolol tartrate (LOPRESSOR) 25 MG tablet Take 1 tablet (25 mg total) by mouth 2 (two) times daily. 180 tablet 2  . neomycin-polymyxin-hydrocortisone (CORTISPORIN) 3.5-10000-1 OTIC suspension Place 3 drops into the left ear as needed.    . nitroGLYCERIN (NITROSTAT) 0.4 MG SL tablet Place 1 tablet (0.4 mg total) under the tongue every 5 (five) minutes as needed for chest pain. 75 tablet 1  . Omega-3 Fatty Acids (OMEGA-3 EPA FISH OIL PO) Take 2 tablets by mouth 2 (two) times daily.     . tamsulosin (FLOMAX) 0.4 MG CAPS capsule Take 0.4 mg by mouth daily.    . traZODone (DESYREL) 50 MG tablet Take 50 mg by mouth at bedtime.     No current facility-administered medications for this visit.    PAST MEDICAL HISTORY: Past Medical History:  Diagnosis Date  . Coronary artery disease 2006   stents x2=LAD LCX; totally occluded RCA b. CABG 03/02/17 x2 (left internal mammary artery to LAD, Y-graft right mammary from left mammary to OM).  . Dyslipidemia   . GERD (gastroesophageal reflux disease)   . History of bronchitis   . Hyperlipidemia    statin intolerant  . Hyperparathyroidism   . Hypertension   . Insomnia   . Metatarsalgia   . Neuropathy   . OA (osteoarthritis)   . Peripheral vascular disease (HCC)   . Seasonal allergies   . Urinary frequency   . Wears glasses     PAST SURGICAL HISTORY: Past Surgical History:  Procedure Laterality Date  . CARDIAC CATHETERIZATION  10/28/2004   circ and mid LAD chyper stents  . CERVICAL FUSION  [id number]  . CERVICAL SPINE SURGERY  2004  . COLONOSCOPY    . CORONARY ANGIOPLASTY  2006   stents placed   . CORONARY ARTERY BYPASS GRAFT N/A 03/02/2017   Procedure: CORONARY ARTERY BYPASS GRAFTING (CABG), ON PUMP, TIMES TWO, USING BILATERAL MAMMARY ARTERIES;  Surgeon: Loreli Slot, MD;  Location: [facility] OR;  Service: Open Heart Surgery;  Laterality: N/A;  . ELBOW ARTHROPLASTY  1994   right  .  ENDARTERECTOMY FEMORAL Left 03/17/2015   Procedure: ENDARTERECTOMY COMMON FEMORAL WITH PROFUNDOPLASTY;  Surgeon: Sherren Kerns, MD;  Location[facility] OR;  Service: Vascular;  Laterality: Left;  . FEMORAL-POPLITEAL BYPASS GRAFT Left 03/17/2015   Procedure: BYPASS GRAFT FEMORAL-POPLITEAL ARTERY-LEFT LEG AND POSTERIOR TIBIAL SEQUENTIAL GRAFT TO BELOW KNEE POPLITEAL ARTERY;  Surgeon: Sherren Kerns, MD;  Location: [facility] OR;  Service: Vascular;  Laterality: Left;  . FEMORAL-POPLITEAL BYPASS GRAFT Right 05/11/2015   Procedure:  RIGHT FEMORAL-BELOW THE KNEE POPLITEAL ARTERY BYPASS GRAFT USING NON REVERSE RIGHT GREATER SAPHENOUS VEIN;  Surgeon: Sherren Kerns, MD;  Location: [facility] OR;  Service: Vascular;  Laterality: Right;  . INCISION AND DRAINAGE ABSCESS POSTERIOR CERVICALSPINE  2009  . INTRAOPERATIVE ARTERIOGRAM Left 03/17/2015   Procedure: INTRA OPERATIVE ARTERIOGRAM X2;  Surgeon: Sherren Kerns, MD;  Location: [facility] OR;  Service: Vascular;  Laterality: Left;  .  INTRAOPERATIVE ARTERIOGRAM Right 05/11/2015   Procedure: INTRA OPERATIVE ARTERIOGRAM;  Surgeon: Sherren Kernsharles E Fields, MD;  [facility];  Service: Vascular;  Laterality: Right;  . LEFT HEART CATH AND CORONARY ANGIOGRAPHY N/A 03/01/2017   Procedure: LEFT HEART CATH AND CORONARY ANGIOGRAPHY;  Surgeon: Kathleene HazelMcAlhany, Christopher D, MD;  Location: [facility] INVASIVE CV LAB;  Service: Cardiovascular;  Laterality: N/A;  . PATCH ANGIOPLASTY Left 03/17/2015   Procedure: VEIN PATCH ANGIOPLASTY COMMON FEMORAL ARTERY;  Surgeon: Sherren Kernsharles E Fields, MD;  [facility];  Service: Vascular;  Laterality: Left;  . PERIPHERAL VASCULAR CATHETERIZATION N/A 01/16/2015   Procedure: Abdominal Aortogram;  Surgeon: Sherren Kernsharles E Fields, MD;  [facility];  Service: Cardiovascular;  Laterality: N/A;  . SHOULDER OPEN ROTATOR CUFF REPAIR Right 04/23/2013   Procedure: RIGHT TWO TENDON ROTATOR CUFF REPAIR  SUBACROMIAL DECOMPRESSION AND OPEN DISTAL CLAVICLE RESECTION;  Surgeon:  Wyn Forsterobert V Sypher Jr., MD;  Location: [facility];  Service: Orthopedics;  Laterality: Right;  . stents placed   2006  . TEE WITHOUT CARDIOVERSION N/A 03/02/2017   Procedure: TRANSESOPHAGEAL ECHOCARDIOGRAM (TEE);  Surgeon: Loreli SlotHendrickson, Mathius C, MD;  [facility];  Service: Open Heart Surgery;  Laterality: N/A;  . VEIN HARVEST Right 05/11/2015   Procedure: WITH NON REVERSE RIGHT GREATER SAPHENOUS VEIN HARVEST;  Surgeon: Sherren Kernsharles E Fields, MD;  [facility];  Service: Vascular;  Laterality: Right;    FAMILY HISTORY: Family History  Problem Relation Age of Onset  . Arthritis Mother   . Varicose Veins Mother   . Lung cancer Father   . COPD Father        Lung  . Cancer Brother        Lukemia  . COPD Brother   . Cancer Brother        Prostate    SOCIAL HISTORY: Social History   Socioeconomic History  . Marital status: Married    Spouse name: Not on file  . Number of children: 1  . Years of education: 212  . Highest education level: High school graduate  Occupational History  . Occupation: Retired  Tobacco Use  . Smoking status: Former Smoker    Types: Cigarettes    Quit date: 05/30/1989    Years since quitting: 30.9  . Smokeless tobacco: Never Used  Vaping Use  . Vaping Use: Never used  Substance and Sexual Activity  . Alcohol use: Yes    Comment: 6 beers per month  . Drug use: Never  . Sexual activity: Not on file  Other Topics Concern  . Not on file  Social History Narrative   Lives with his wife.   One stepson.   Left-handed.   One cup caffeine per week.    Social Determinants of Health   Financial Resource Strain:   . Difficulty of Paying Living Expenses: Not on file  Food Insecurity:   . Worried About Programme researcher, broadcasting/film/videounning Out of Food in the Last Year: Not on file  . Ran Out of Food in the Last Year: Not on file  Transportation Needs:   . Lack of Transportation (Medical): Not on file  . Lack of Transportation (Non-Medical): Not on file  Physical  Activity:   . Days of Exercise per Week: Not on file  . Minutes of Exercise per Session: Not on file  Stress:   . Feeling of Stress : Not on file  Social Connections:   . Frequency of Communication with Friends and Family: Not on file  .  Frequency of Social Gatherings with Friends and Family: Not on file  . Attends Religious Services: Not on file  . Active Member of Clubs or Organizations: Not on file  . Attends Banker Meetings: Not on file  . Marital Status: Not on file  Intimate Partner Violence:   . Fear of Current or Ex-Partner: Not on file  . Emotionally Abused: Not on file  . Physically Abused: Not on file  . Sexually Abused: Not on file     PHYSICAL EXAM   There were no vitals filed for this visit. Not recorded     There is no height or weight on file to calculate BMI.  PHYSICAL EXAMNIATION:  Gen: NAD, conversant, well nourised, well groomed                     Cardiovascular: Regular rate rhythm, no peripheral edema, warm, nontender. Eyes: Conjunctivae clear without exudates or hemorrhage Neck: Supple, no carotid bruits. Pulmonary: Clear to auscultation bilaterally   NEUROLOGICAL EXAM:  MENTAL STATUS: Speech:    Speech is normal; fluent and spontaneous with normal comprehension.  Cognition:     Orientation to time, place and person     Normal recent and remote memory     Normal Attention span and concentration     Normal Language, naming, repeating,spontaneous speech     Fund of knowledge   CRANIAL NERVES: CN II: Visual fields are full to confrontation. Pupils are round equal and briskly reactive to light. CN III, IV, VI: extraocular movement are normal. No ptosis. CN V: Facial sensation is intact to light touch CN VII: Face is symmetric with normal eye closure  CN VIII: Hearing is normal to causal conversation. CN IX, X: Phonation is normal. CN XI: Head turning and shoulder shrug are intact  MOTOR: There is no pronator drift of  out-stretched arms. Muscle bulk and tone are normal. Muscle strength is normal.  REFLEXES: Reflexes are 2+ and symmetric at the biceps, triceps,3/3 knees, and ankles. Plantar responses are flexor.  SENSORY: Length dependent decreased vibratory sensation, pinprick, well-healed bilateral saphenous vein salvation scar,  COORDINATION: There is no trunk or limb dysmetria noted.  GAIT/STANCE: He can get up from seated position arm crossed, steady, able to stand up on tiptoe, heels, Romberg sign was negative,  DIAGNOSTIC DATA (LABS, IMAGING, TESTING) - I reviewed patient records, labs, notes, testing and imaging myself where available.   ASSESSMENT AND PLAN  Derrick [name] is a 64 y.o. male   More than 10 years history of slow worsening bilateral lower extremity paresthesia, starting from bilateral lower extremity, now involving bilateral foot, occasionally bilateral lower extremity buckled underneath him History of cervical decompression surgery, 3 times, most recent one was 2009, for cervical myelopathy, cervical radiculopathy. Had a history of CABG surgery, peripheral vascular disease, femoral artery bypass surgery, salvage of bilateral saphenous vein,  On examination, he has mild length dependent sensory changes, left worse than right, more towards the previous saphenous vein salvage site, hyperreflexia especially bilateral knee, steady gait,  Differentiation diagnosis include residual symptoms from cervical myelopathy, need to rule out peripheral neuropathy  MRI of cervical spine  EMG nerve conduction study,   Levert Feinstein, M.D. Ph.D.  [facility] [address] [place], [place] [address] Ph: [phone] Fax: [phone]  CC:  Su Hoff, PA-C [address] [place],  [place] [address]  Soundra Pilon, [name]

## 2020-04-30 ENCOUNTER — Other Ambulatory Visit: Payer: Self-pay

## 2020-04-30 ENCOUNTER — Ambulatory Visit (INDEPENDENT_AMBULATORY_CARE_PROVIDER_SITE_OTHER): Payer: Medicare Other | Admitting: Physician Assistant

## 2020-04-30 ENCOUNTER — Ambulatory Visit (HOSPITAL_COMMUNITY)
Admission: RE | Admit: 2020-04-30 | Discharge: 2020-04-30 | Disposition: A | Discharge status: Home / Self Care | Payer: Medicare Other | Source: Ambulatory Visit | Attending: Vascular Surgery | Admitting: Vascular Surgery

## 2020-04-30 ENCOUNTER — Ambulatory Visit (INDEPENDENT_AMBULATORY_CARE_PROVIDER_SITE_OTHER)
Admission: RE | Admit: 2020-04-30 | Discharge: 2020-04-30 | Disposition: A | Discharge status: Home / Self Care | Payer: Medicare Other | Source: Ambulatory Visit | Attending: Vascular Surgery | Admitting: Vascular Surgery

## 2020-04-30 VITALS — BP 166/74 | HR 61 | Temp 98.6°F | Ht 74.0 in | Wt 214.2 lb

## 2020-04-30 DIAGNOSIS — Z95828 Presence of other vascular implants and grafts: Secondary | ICD-10-CM | POA: Insufficient documentation

## 2020-04-30 DIAGNOSIS — I779 Disorder of arteries and arterioles, unspecified: Secondary | ICD-10-CM | POA: Insufficient documentation

## 2020-04-30 DIAGNOSIS — I6523 Occlusion and stenosis of bilateral carotid arteries: Secondary | ICD-10-CM | POA: Diagnosis present

## 2020-04-30 NOTE — Progress Notes (Signed)
Office Note     CC:  follow up Requesting Provider:  Soundra Pilon, [name]  HPI:  Derrick [name] is a 64 y.o. male who is s/pleft femoral to posterior tibial bypass and a sequential graft to the below-knee popliteal artery on March 17, 2015. He subsequently underwent right femoral to below-knee popliteal bypass using saphenous vein on December 12, 2016by Dr. Darrick Penna. He is also s/p bilateral CIA stents placed on 01-16-15. History of mild bilateral carotid artery stenosis.  Hedenies upper or lower extremity claudication symptoms. He recently retired and is not walking as much.  He denies any hx of stroke or TIA. Currently without symptoms of monocular blindness, slurred speech, facial drooping, extremity weakness or paralysis.  He has some residual left medial calf numbness.  His neurologist has diagnosed him with peripheral neuropathy.  He had right knee surgery for torn meniscus, June 2020.  He had a CABG on 03-02-17, several coronary artery stents placed prior to this.   Diabetic:No Tobacco PYK:DXIPJA smoker, quitin 1991, started in his teens  Pt meds include: Statin :No, causes myalgias. Takes Zetia.  Remains involved in lipid study. Betablocker:Yes. CCB: yes ARB: yes ASA:Yes, 81 mg  Other anticoagulants/antiplatelets:no   Past Medical History:  Diagnosis Date  . Coronary artery disease 2006   stents x2=LAD LCX; totally occluded RCA b. CABG 03/02/17 x2 (left internal mammary artery to LAD, Y-graft right mammary from left mammary to OM).  . Dyslipidemia   . GERD (gastroesophageal reflux disease)   . History of bronchitis   . Hyperlipidemia    statin intolerant  . Hyperparathyroidism   . Hypertension   . Insomnia   . Metatarsalgia   . Neuropathy   . OA (osteoarthritis)   . Peripheral vascular disease (HCC)   . Seasonal allergies   . Urinary frequency   . Wears glasses     Past Surgical History:  Procedure Laterality Date  . CARDIAC CATHETERIZATION   10/28/2004   circ and mid LAD chyper stents  . CERVICAL FUSION  [id number]  . CERVICAL SPINE SURGERY  2004  . COLONOSCOPY    . CORONARY ANGIOPLASTY  2006   stents placed   . CORONARY ARTERY BYPASS GRAFT N/A 03/02/2017   Procedure: CORONARY ARTERY BYPASS GRAFTING (CABG), ON PUMP, TIMES TWO, USING BILATERAL MAMMARY ARTERIES;  Surgeon: Loreli Slot, MD;  Location: [facility] OR;  Service: Open Heart Surgery;  Laterality: N/A;  . ELBOW ARTHROPLASTY  1994   right  . ENDARTERECTOMY FEMORAL Left 03/17/2015   Procedure: ENDARTERECTOMY COMMON FEMORAL WITH PROFUNDOPLASTY;  Surgeon: Sherren Kerns, MD;  Location: [facility] OR;  Service: Vascular;  Laterality: Left;  . FEMORAL-POPLITEAL BYPASS GRAFT Left 03/17/2015   Procedure: BYPASS GRAFT FEMORAL-POPLITEAL ARTERY-LEFT LEG AND POSTERIOR TIBIAL SEQUENTIAL GRAFT TO BELOW KNEE POPLITEAL ARTERY;  Surgeon: Sherren Kerns, MD;  Location: [facility] OR;  Service: Vascular;  Laterality: Left;  . FEMORAL-POPLITEAL BYPASS GRAFT Right 05/11/2015   Procedure:  RIGHT FEMORAL-BELOW THE KNEE POPLITEAL ARTERY BYPASS GRAFT USING NON REVERSE RIGHT GREATER SAPHENOUS VEIN;  Surgeon: Sherren Kerns, MD;  Location: [facility] OR;  Service: Vascular;  Laterality: Right;  . INCISION AND DRAINAGE ABSCESS POSTERIOR CERVICALSPINE  2009  . INTRAOPERATIVE ARTERIOGRAM Left 03/17/2015   Procedure: INTRA OPERATIVE ARTERIOGRAM X2;  Surgeon: Sherren Kerns, MD;  Location: [facility] OR;  Service: Vascular;  Laterality: Left;  . INTRAOPERATIVE ARTERIOGRAM Right 05/11/2015   Procedure: INTRA OPERATIVE ARTERIOGRAM;  Surgeon: Sherren Kerns, MD;  Location: [facility] OR;  Service:  Vascular;  Laterality: Right;  . LEFT HEART CATH AND CORONARY ANGIOGRAPHY N/A 03/01/2017   Procedure: LEFT HEART CATH AND CORONARY ANGIOGRAPHY;  Surgeon: Kathleene Hazel, MD;  Location: [facility] INVASIVE CV LAB;  Service: Cardiovascular;  Laterality: N/A;  . PATCH ANGIOPLASTY Left 03/17/2015   Procedure: VEIN PATCH ANGIOPLASTY COMMON FEMORAL  ARTERY;  Surgeon: Sherren Kerns, MD;  Location: [facility] OR;  Service: Vascular;  Laterality: Left;  . PERIPHERAL VASCULAR CATHETERIZATION N/A 01/16/2015   Procedure: Abdominal Aortogram;  Surgeon: Sherren Kerns, MD;  Location: [facility] INVASIVE CV LAB;  Service: Cardiovascular;  Laterality: N/A;  . SHOULDER OPEN ROTATOR CUFF REPAIR Right 04/23/2013   Procedure: RIGHT TWO TENDON ROTATOR CUFF REPAIR  SUBACROMIAL DECOMPRESSION AND OPEN DISTAL CLAVICLE RESECTION;  Surgeon: Wyn Forster., MD;  Location: [facility];  Service: Orthopedics;  Laterality: Right;  . stents placed   2006  . TEE WITHOUT CARDIOVERSION N/A 03/02/2017   Procedure: TRANSESOPHAGEAL ECHOCARDIOGRAM (TEE);  Surgeon: Loreli Slot, MD;  Location: [facility] OR;  Service: Open Heart Surgery;  Laterality: N/A;  . VEIN HARVEST Right 05/11/2015   Procedure: WITH NON REVERSE RIGHT GREATER SAPHENOUS VEIN HARVEST;  Surgeon: Sherren Kerns, MD;  Location: [facility] OR;  Service: Vascular;  Laterality: Right;    Social History   Socioeconomic History  . Marital status: Married    Spouse name: Not on file  . Number of children: 1  . Years of education: 53  . Highest education level: High school graduate  Occupational History  . Occupation: Retired  Tobacco Use  . Smoking status: Former Smoker    Types: Cigarettes    Quit date: 05/30/1989    Years since quitting: 30.9  . Smokeless tobacco: Never Used  Vaping Use  . Vaping Use: Never used  Substance and Sexual Activity  . Alcohol use: Yes    Comment: 6 beers per month  . Drug use: Never  . Sexual activity: Not on file  Other Topics Concern  . Not on file  Social History Narrative   Lives with his wife.   One stepson.   Left-handed.   One cup caffeine per week.    Social Determinants of Health   Financial Resource Strain:   . Difficulty of Paying Living Expenses: Not on file  Food Insecurity:   . Worried About Programme researcher, broadcasting/film/video in the Last Year: Not on file  .  Ran Out of Food in the Last Year: Not on file  Transportation Needs:   . Lack of Transportation (Medical): Not on file  . Lack of Transportation (Non-Medical): Not on file  Physical Activity:   . Days of Exercise per Week: Not on file  . Minutes of Exercise per Session: Not on file  Stress:   . Feeling of Stress : Not on file  Social Connections:   . Frequency of Communication with Friends and Family: Not on file  . Frequency of Social Gatherings with Friends and Family: Not on file  . Attends Religious Services: Not on file  . Active Member of Clubs or Organizations: Not on file  . Attends Banker Meetings: Not on file  . Marital Status: Not on file  Intimate Partner Violence:   . Fear of Current or Ex-Partner: Not on file  . Emotionally Abused: Not on file  . Physically Abused: Not on file  . Sexually Abused: Not on file   Family History  Problem Relation Age of Onset  .  Arthritis Mother   . Varicose Veins Mother   . Lung cancer Father   . COPD Father        Lung  . Cancer Brother        Lukemia  . COPD Brother   . Cancer Brother        Prostate    Current Outpatient Medications  Medication Sig Dispense Refill  . AMBULATORY NON FORMULARY MEDICATION Take 180 mg by mouth 1 day or 1 dose. [name]    . aspirin EC 81 MG tablet Take 1 tablet (81 mg total) by mouth daily.    . calcitRIOL (ROCALTROL) 0.5 MCG capsule Take 0.5 mcg by mouth daily.    . calcium carbonate (OSCAL) 1500 (600 Ca) MG TABS tablet Take 600 mg of elemental calcium 2 (two) times daily with a meal by mouth. Patient takes 2 in the morning and 1 in the evening.    . Cyanocobalamin (VITAMIN B-12) 2500 MCG SUBL Place 2,500 mcg under the tongue daily.    Marland Kitchen. diltiazem (CARDIZEM CD) 120 MG 24 hr capsule Take 1 capsule (120 mg total) by mouth daily. Please keep upcoming appt in February 2022 with Dr. Elease HashimotoNahser before anymore refills. Thank you 90 capsule 0  .  ezetimibe (ZETIA) 10 MG tablet Take 10 mg by mouth daily.      . famotidine (PEPCID) 20 MG tablet Take 20 mg by mouth 2 (two) times daily.    . fenofibrate 160 MG tablet Take 160 mg by mouth daily.    Marland Kitchen. gabapentin (NEURONTIN) 300 MG capsule Take 300 mg by mouth at bedtime.    Marland Kitchen. HYDROcodone-acetaminophen (NORCO/VICODIN) 5-325 MG tablet Take 1 tablet by mouth every 6 (six) hours as needed for moderate pain.    Marland Kitchen. ipratropium (ATROVENT) 0.03 % nasal spray Place 1 spray into both nostrils as needed.    Marland Kitchen. losartan (COZAAR) 100 MG tablet Take 100 mg by mouth every evening.     Marland Kitchen. MAGNESIUM PO Take 250 mg by mouth daily.    . metoprolol tartrate (LOPRESSOR) 25 MG tablet Take 1 tablet (25 mg total) by mouth 2 (two) times daily. 180 tablet 2  . neomycin-polymyxin-hydrocortisone (CORTISPORIN) 3.5-10000-1 OTIC suspension Place 3 drops into the left ear as needed.    . nitroGLYCERIN (NITROSTAT) 0.4 MG SL tablet Place 1 tablet (0.4 mg total) under the tongue every 5 (five) minutes as needed for chest pain. 75 tablet 1  . Omega-3 Fatty Acids (OMEGA-3 EPA FISH OIL PO) Take 2 tablets by mouth 2 (two) times daily.     . tamsulosin (FLOMAX) 0.4 MG CAPS capsule Take 0.4 mg by mouth daily.    . traZODone (DESYREL) 50 MG tablet Take 50 mg by mouth at bedtime.     No current facility-administered medications for this visit.    Allergies  Allergen Reactions  . Codeine Itching  . Atorvastatin Other (See Comments)    Muscle aches Muscle aches  . Crestor [Rosuvastatin Calcium]     Muscle aches  . Meloxicam Other (See Comments)    Can not take due to kidneys  . Statins Other (See Comments)    myalgia     REVIEW OF SYSTEMS:   [X]  denotes positive finding, [ ]  denotes negative finding Cardiac  Comments:  Chest pain or chest pressure:    Shortness of breath upon exertion:    Short of breath when lying flat:    Irregular heart rhythm:  Vascular    Pain in calf, thigh, or hip brought on by ambulation:     Pain in feet at night that wakes you up from your sleep:     Blood clot in your veins:    Leg swelling:         Pulmonary    Oxygen at home:    Productive cough:     Wheezing:         Neurologic    Sudden weakness in arms or legs:     Sudden numbness in arms or legs:     Sudden onset of difficulty speaking or slurred speech:    Temporary loss of vision in one eye:     Problems with dizziness:         Gastrointestinal    Blood in stool:     Vomited blood:         Genitourinary    Burning when urinating:     Blood in urine:        Psychiatric    Major depression:         Hematologic    Bleeding problems:    Problems with blood clotting too easily:        Skin    Rashes or ulcers:   See HPI      Constitutional    Fever or chills:      PHYSICAL EXAMINATION:  Vitals:   04/30/20 0911  BP: (!) 154/70  Pulse: 61  Temp: 98.6 F (37 C)  SpO2: 98%   General:  WDWN in NAD; vital signs documented above Gait: unaided; no ataxia HENT: WNL, normocephalic Pulmonary: normal non-labored breathing , without Rales, rhonchi,  wheezing Cardiac: regular HR, without  Murmurs without carotid bruit Abdomen: soft, NT, no masses Skin: without rashes Vascular Exam/Pulses: He has 2+ bilateral palpable dorsalis pedis, femoral and radial pulses. Extremities: without ischemic changes, without Gangrene , without cellulitis; without open wounds;  Musculoskeletal: no muscle wasting or atrophy  Neurologic: A&O X 3;  No focal weakness or paresthesias are detected Psychiatric:  The pt has Normal affect.   Non-Invasive Vascular Imaging:   04/30/2020  Carotid duplex: Right Carotid: Velocities in the right ICA are consistent with a 1-39%  stenosis.   Left Carotid: Velocities in the left ICA are consistent with a 1-39%  stenosis.   Vertebrals: Bilateral vertebral arteries demonstrate antegrade flow.   Subclavians: Right subclavian artery was stenotic. Normal flow  hemodynamics  were seen in the left subclavian artery.   LE duplex:  Right: Widely patent femoropopliteal bypass graft.  Triphasic waveforms  Left: Widely patent femoral to posterior tibial artery bypass graft.  Triphasic waveforms  ABIs: ABI/TBI Today's ABI Today's TBI Previous ABI Previous TBI Right 1.25 0.72 1.26 0.68 Left 1.15 0.63 1.02 0.72  ASSESSMENT/PLAN:: 64 y.o. male here for follow up for lateral lower extremity revascularization procedures in 2016.  The patient is asymptomatic.  His studies today reveal patent bilateral bypasses.  He has palpable pulses.  He has left medial calf neuropathy.  Motor function intact.  Continue aspirin.  Follow-up in 1 year with bilateral lower extremity duplex and bilateral iliac duplex. Call for activity limiting LE pain or skin issues.  Mild bilateral carotid artery stenosis.  Rigth subclavian stenosis. This is stable.  He is asymptomatic.  We reviewed the signs and symptoms of stroke and he is instructed to seek mediate medical attention should these occur.  We will plan to recheck carotid duplex in 2  years.  Milinda Antis, PA-C [facility] [phone]  Clinic MD:   Darrick Penna

## 2020-05-01 LAB — TSH: TSH: 1.97 u[IU]/mL (ref 0.450–4.500)

## 2020-05-01 LAB — CBC WITH DIFFERENTIAL/PLATELET
Basophils Absolute: 0.1 10*3/uL (ref 0.0–0.2)
Basos: 2 %
EOS (ABSOLUTE): 0.1 10*3/uL (ref 0.0–0.4)
Eos: 2 %
Hematocrit: 38.4 % (ref 37.5–51.0)
Hemoglobin: 13.4 g/dL (ref 13.0–17.7)
Immature Grans (Abs): 0 10*3/uL (ref 0.0–0.1)
Immature Granulocytes: 1 %
Lymphocytes Absolute: 1.1 10*3/uL (ref 0.7–3.1)
Lymphs: 20 %
MCH: 31.2 pg (ref 26.6–33.0)
MCHC: 34.9 g/dL (ref 31.5–35.7)
MCV: 89 fL (ref 79–97)
Monocytes Absolute: 0.7 10*3/uL (ref 0.1–0.9)
Monocytes: 13 %
Neutrophils Absolute: 3.3 10*3/uL (ref 1.4–7.0)
Neutrophils: 62 %
Platelets: 319 10*3/uL (ref 150–450)
RBC: 4.3 x10E6/uL (ref 4.14–5.80)
RDW: 12.8 % (ref 11.6–15.4)
WBC: 5.4 10*3/uL (ref 3.4–10.8)

## 2020-05-01 LAB — ANA W/REFLEX IF POSITIVE: Anti Nuclear Antibody (ANA): NEGATIVE

## 2020-05-01 LAB — MULTIPLE MYELOMA PANEL, SERUM
Albumin SerPl Elph-Mcnc: 4 g/dL (ref 2.9–4.4)
Albumin/Glob SerPl: 1.2 (ref 0.7–1.7)
Alpha 1: 0.3 g/dL (ref 0.0–0.4)
Alpha2 Glob SerPl Elph-Mcnc: 0.9 g/dL (ref 0.4–1.0)
B-Globulin SerPl Elph-Mcnc: 1.2 g/dL (ref 0.7–1.3)
Gamma Glob SerPl Elph-Mcnc: 1.1 g/dL (ref 0.4–1.8)
Globulin, Total: 3.5 g/dL (ref 2.2–3.9)
IgA/Immunoglobulin A, Serum: 172 mg/dL (ref 61–437)
IgG (Immunoglobin G), Serum: 986 mg/dL (ref 603–1613)
IgM (Immunoglobulin M), Srm: 126 mg/dL (ref 20–172)

## 2020-05-01 LAB — HEMOGLOBIN A1C
Est. average glucose Bld gHb Est-mCnc: 114 mg/dL
Hgb A1c MFr Bld: 5.6 % (ref 4.8–5.6)

## 2020-05-01 LAB — CK: Total CK: 126 U/L (ref 41–331)

## 2020-05-01 LAB — COMPREHENSIVE METABOLIC PANEL
ALT: 22 IU/L (ref 0–44)
AST: 27 IU/L (ref 0–40)
Albumin/Globulin Ratio: 1.6 (ref 1.2–2.2)
Albumin: 4.6 g/dL (ref 3.8–4.8)
Alkaline Phosphatase: 45 IU/L (ref 44–121)
BUN/Creatinine Ratio: 16 (ref 10–24)
BUN: 23 mg/dL (ref 8–27)
Bilirubin Total: 0.4 mg/dL (ref 0.0–1.2)
CO2: 25 mmol/L (ref 20–29)
Calcium: 9.5 mg/dL (ref 8.6–10.2)
Chloride: 102 mmol/L (ref 96–106)
Creatinine, Ser: 1.44 mg/dL — ABNORMAL HIGH (ref 0.76–1.27)
GFR calc Af Amer: 58 mL/min/{1.73_m2} — ABNORMAL LOW (ref >59)
GFR calc non Af Amer: 50 mL/min/{1.73_m2} — ABNORMAL LOW (ref >59)
Globulin, Total: 2.9 g/dL (ref 1.5–4.5)
Glucose: 90 mg/dL (ref 65–99)
Potassium: 4.5 mmol/L (ref 3.5–5.2)
Sodium: 142 mmol/L (ref 134–144)
Total Protein: 7.5 g/dL (ref 6.0–8.5)

## 2020-05-01 LAB — VITAMIN B12: Vitamin B-12: 1455 pg/mL — ABNORMAL HIGH (ref 232–1245)

## 2020-05-01 LAB — C-REACTIVE PROTEIN: CRP: 3 mg/L (ref 0–10)

## 2020-05-01 LAB — SEDIMENTATION RATE: Sed Rate: 5 mm/hr (ref 0–30)

## 2020-05-01 LAB — RPR: RPR Ser Ql: NONREACTIVE

## 2020-05-04 ENCOUNTER — Telehealth: Payer: Self-pay | Admitting: Neurology

## 2020-05-04 NOTE — Telephone Encounter (Signed)
I spoke to the patient and he verbalized understanding of the lab findings. States he normally drinks soda during the day but will start drinking more water.

## 2020-05-04 NOTE — Telephone Encounter (Signed)
Please call patient, the only abnormality on extensive laboratory evaluation was mildly elevated creatinine 1.44, with decreased GFR of 50,  Rest of the laboratory evaluation showed no significant abnormalities.

## 2020-05-18 ENCOUNTER — Encounter: Payer: BC Managed Care – PPO | Admitting: *Deleted

## 2020-05-18 ENCOUNTER — Other Ambulatory Visit: Payer: Self-pay

## 2020-05-18 VITALS — BP 125/62 | HR 58 | Wt 215.0 lb

## 2020-05-18 DIAGNOSIS — Z006 Encounter for examination for normal comparison and control in clinical research program: Secondary | ICD-10-CM

## 2020-05-18 NOTE — Research (Signed)
Subject came into clinic today for visit T12, M30 for the CLEAR research study.   Subject was re-consented to [place] Version 8.1 [id number] Local [id number] on Monday, December 20th, 2021 at 0802.   Subject Name: Derrick [name]  Subject met inclusion and exclusion criteria.  The informed consent form, study requirements and expectations were reviewed with the subject and questions and concerns were addressed prior to the signing of the consent form.  The subject verbalized understanding of the trial requirements.  The subject agreed to participate in the CLEAR trial and signed the informed consent at [place] on 05/18/20  The informed consent was obtained prior to performance of any protocol-specific procedures for the subject.  A copy of the signed informed consent was given to the subject and a copy was placed in the subject's medical record.   Preston Fleeting C     Subject's concomitant medications were reviewed and updated if applicable. No AE's or SAE's to report to sponsor at this time. IP compliance was 100%. New IP was dispensed and next appointment was scheduled for Wednesday, June 15th, 2022 @ 0800.

## 2020-07-16 ENCOUNTER — Encounter: Payer: Self-pay | Admitting: Cardiovascular Disease

## 2020-07-16 NOTE — Progress Notes (Signed)
Derrick AllegraSteven R [name] Date of Birth  01/20/1956 [facility] [address]     [address] [place], [place]  [id number] [phone]  Fax  [phone]  Problem List: 1. CAD, status post stents to the LAD,  left circumflex artery 2006 . His chronically occluded right coronary artery S/p CABG ( bilateral IMA )  2. hyperlipidemia-intolerant to statins 3. Hypertension 4. Peripheral Vascular disease    65 yo male with history of CAD - s/p stents to LAD and LCx.  Chronically occluded RCA.  No angina.  He eats a very fatty diet - lots of fried foods.  Not exercising.  He does lots of yard work and has never had any episodes of angina.  Oct 01, 2013:  Derrick [name] was seen by Derrick FiscalLori in Oct. 2014 for pre-op eval prior to rotator cuff surgery.    His myoview study showed:  Low risk stress nuclear study with a large, severe intensity, partially reversible inferior defect consistent with prior inferior infarct and very mild peri-infarct ischemia.  LV Ejection Fraction: 54%. LV Wall Motion: Inferior hypokinesis.  He has shoulder surgery without difficulty.  He's doing well. He denies any chest pain or shortness of breath.  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     Feb. 12 2018:    Has done well.  Has hx of PVD - Derrick [name](Derrick [name])   Oct. 1 2018;    Derrick CanalesSteve has been having some DOE , chest pain with exertion Multiple occasions over the past month,,   Gradually getting worse   Occurred while he was pruning and cleaning up his yard.  [name] exercising more.  Works 11 hours a day . Works at [employer] .  Symptoms feel very similar to his previous symptoms in 2006  May 05, 2017:  Derrick [name] [name] doing well.  He had a cath in October that showed significant left main disease as well as other narrowings.  He had coronary artery bypass grafting.  He [name] done well since that time.  He had some postoperative atrial fibrillation.  He [name] on Cardizem 120 mg a day  as well as metoprolol 25 mg twice a day.  He was not started on anticoagulation.  August 31, 2017: Derrick [name] [name] seen today for follow-up visit.  He has a history of coronary artery bypass grafting with postoperative atrial fibrillation.  History of hypertension and hyperlipidemia.  Has gained some weight.  Walks some  No CP ,   Lipids are elevatee.  intolerent to statins   Sept. 26, 2019: Doing well since his CABG in Oct. 2018 Was noted to have very minimal carotid artery disease at that time. Walks some .   9000-10000 steps a day  Still works  Derrick [name]( Derrick [name] )   [name] in the Clear trial .     July 22, 2019  Hx of CABG in 2018.  No further episode so aF recently  Still at [employer] .   Going to retire.soon .    Got his shingles shot Advised him to delay his covid shot for several weeks.   Feb, 18, 2022:  Derrick [name] [name] seen today for follow up of his CAD /CABG. BP [name] mildly elevated.  [place] [employer] Labs from primary MD are reviewed.  Trigs are > 500, LDL [name] 77   Current Outpatient Medications  Medication Sig Dispense Refill  . AMBULATORY NON FORMULARY MEDICATION Take  180 mg by mouth 1 day or 1 dose. [name]    . aspirin EC 81 MG tablet Take 1 tablet (81 mg total) by mouth daily.    . calcitRIOL (ROCALTROL) 0.5 MCG capsule Take 0.5 mcg by mouth daily.    . calcium carbonate (OSCAL) 1500 (600 Ca) MG TABS tablet Take 600 mg of elemental calcium 2 (two) times daily with a meal by mouth. Patient takes 2 in the morning and 1 in the evening.    . Cyanocobalamin (VITAMIN B-12) 2500 MCG SUBL Place 2,500 mcg under the tongue daily.    Marland Kitchen diltiazem (CARDIZEM CD) 120 MG 24 hr capsule Take 1 capsule (120 mg total) by mouth daily. Please keep upcoming appt in February 2022 with Derrick [name] before anymore refills. Thank you 90 capsule 0  . ezetimibe (ZETIA) 10 MG tablet Take 10 mg by mouth daily.    . famotidine (PEPCID) 20 MG  tablet Take 20 mg by mouth 2 (two) times daily.    . fenofibrate 160 MG tablet Take 160 mg by mouth daily.    Marland Kitchen gabapentin (NEURONTIN) 300 MG capsule Take 300 mg by mouth at bedtime.    Marland Kitchen HYDROcodone-acetaminophen (NORCO/VICODIN) 5-325 MG tablet Take 1 tablet by mouth every 6 (six) hours as needed for moderate pain.    Marland Kitchen ipratropium (ATROVENT) 0.03 % nasal spray Place 1 spray into both nostrils as needed.    Marland Kitchen losartan (COZAAR) 100 MG tablet Take 100 mg by mouth every evening.     Marland Kitchen MAGNESIUM PO Take 250 mg by mouth daily.    . metoprolol tartrate (LOPRESSOR) 25 MG tablet Take 1 tablet (25 mg total) by mouth 2 (two) times daily. 180 tablet 2  . neomycin-polymyxin-hydrocortisone (CORTISPORIN) 3.5-10000-1 OTIC suspension Place 3 drops into the left ear as needed.    . nitroGLYCERIN (NITROSTAT) 0.4 MG SL tablet Place 1 tablet (0.4 mg total) under the tongue every 5 (five) minutes as needed for chest pain. 75 tablet 1  . Omega-3 Fatty Acids (OMEGA-3 EPA FISH OIL PO) Take 2 tablets by mouth 2 (two) times daily.     . tamsulosin (FLOMAX) 0.4 MG CAPS capsule Take 0.4 mg by mouth daily.    . traZODone (DESYREL) 50 MG tablet Take 50 mg by mouth at bedtime.     No current facility-administered medications for this visit.     Allergies  Allergen Reactions  . Codeine Itching  . Atorvastatin Other (See Comments)    Muscle aches Muscle aches  . Crestor [Rosuvastatin Calcium]     Muscle aches  . Meloxicam Other (See Comments)    Can not take due to kidneys  . Statins Other (See Comments)    myalgia    Past Medical History:  Diagnosis Date  . Coronary artery disease 2006   stents x2=LAD LCX; totally occluded RCA b. CABG 03/02/17 x2 (left internal mammary artery to LAD, Y-graft right mammary from left mammary to OM).  . Dyslipidemia   . GERD (gastroesophageal reflux disease)   . History of bronchitis   . Hyperlipidemia    statin intolerant  . Hyperparathyroidism   . Hypertension   . Insomnia    . Metatarsalgia   . Neuropathy   . OA (osteoarthritis)   . Peripheral vascular disease (HCC)   . Seasonal allergies   . Urinary frequency   . Wears glasses     Past Surgical History:  Procedure Laterality Date  . CARDIAC  CATHETERIZATION  10/28/2004   circ and mid LAD chyper stents  . CERVICAL FUSION  [id number]  . CERVICAL SPINE SURGERY  2004  . COLONOSCOPY    . CORONARY ANGIOPLASTY  2006   stents placed   . CORONARY ARTERY BYPASS GRAFT N/A 03/02/2017   Procedure: CORONARY ARTERY BYPASS GRAFTING (CABG), ON PUMP, TIMES TWO, USING BILATERAL MAMMARY ARTERIES;  Surgeon: Loreli Slot, MD;  Location: [facility] OR;  Service: Open Heart Surgery;  Laterality: N/A;  . ELBOW ARTHROPLASTY  1994   right  . ENDARTERECTOMY FEMORAL Left 03/17/2015   Procedure: ENDARTERECTOMY COMMON FEMORAL WITH PROFUNDOPLASTY;  Surgeon: Sherren Kerns, MD;  Location: [facility] OR;  Service: Vascular;  Laterality: Left;  . FEMORAL-POPLITEAL BYPASS GRAFT Left 03/17/2015   Procedure: BYPASS GRAFT FEMORAL-POPLITEAL ARTERY-LEFT LEG AND POSTERIOR TIBIAL SEQUENTIAL GRAFT TO BELOW KNEE POPLITEAL ARTERY;  Surgeon: Sherren Kerns, MD;  Location[facility] OR;  Service: Vascular;  Laterality: Left;  . FEMORAL-POPLITEAL BYPASS GRAFT Right 05/11/2015   Procedure:  RIGHT FEMORAL-BELOW THE KNEE POPLITEAL ARTERY BYPASS GRAFT USING NON REVERSE RIGHT GREATER SAPHENOUS VEIN;  Surgeon: Sherren Kerns, MD;  Location: [facility] OR;  Service: Vascular;  Laterality: Right;  . INCISION AND DRAINAGE ABSCESS POSTERIOR CERVICALSPINE  2009  . INTRAOPERATIVE ARTERIOGRAM Left 03/17/2015   Procedure: INTRA OPERATIVE ARTERIOGRAM X2;  Surgeon: Sherren Kerns, MD;  Location: [facility] OR;  Service: Vascular;  Laterality: Left;  . INTRAOPERATIVE ARTERIOGRAM Right 05/11/2015   Procedure: INTRA OPERATIVE ARTERIOGRAM;  Surgeon: Sherren Kerns, MD;  Location[facility] OR;  Service: Vascular;  Laterality: Right;  . LEFT HEART CATH AND CORONARY ANGIOGRAPHY N/A 03/01/2017    Procedure: LEFT HEART CATH AND CORONARY ANGIOGRAPHY;  Surgeon: Kathleene Hazel, MD;  Location: [facility] INVASIVE CV LAB;  Service: Cardiovascular;  Laterality: N/A;  . PATCH ANGIOPLASTY Left 03/17/2015   Procedure: VEIN PATCH ANGIOPLASTY COMMON FEMORAL ARTERY;  Surgeon: Sherren Kerns, MD;  Location: [facility] OR;  Service: Vascular;  Laterality: Left;  . PERIPHERAL VASCULAR CATHETERIZATION N/A 01/16/2015   Procedure: Abdominal Aortogram;  Surgeon: Sherren Kerns, MD;  Location: [facility] INVASIVE CV LAB;  Service: Cardiovascular;  Laterality: N/A;  . SHOULDER OPEN ROTATOR CUFF REPAIR Right 04/23/2013   Procedure: RIGHT TWO TENDON ROTATOR CUFF REPAIR  SUBACROMIAL DECOMPRESSION AND OPEN DISTAL CLAVICLE RESECTION;  Surgeon: Wyn Forster., MD;  Location: [facility];  Service: Orthopedics;  Laterality: Right;  . stents placed   2006  . TEE WITHOUT CARDIOVERSION N/A 03/02/2017   Procedure: TRANSESOPHAGEAL ECHOCARDIOGRAM (TEE);  Surgeon: Loreli Slot, MD;  Location: [facility] OR;  Service: Open Heart Surgery;  Laterality: N/A;  . VEIN HARVEST Right 05/11/2015   Procedure: WITH NON REVERSE RIGHT GREATER SAPHENOUS VEIN HARVEST;  Surgeon: Sherren Kerns, MD;  Location: [facility] OR;  Service: Vascular;  Laterality: Right;    Social History   Tobacco Use  Smoking Status Former Smoker  . Types: Cigarettes  . Quit date: 05/30/1989  . Years since quitting: 31.1  Smokeless Tobacco Never Used    Social History   Substance and Sexual Activity  Alcohol Use Yes   Comment: 6 beers per month    Family History  Problem Relation Age of Onset  . Arthritis Mother   . Varicose Veins Mother   . Lung cancer Father   . COPD Father        Lung  . Cancer Brother        Lukemia  . COPD Brother   .  Cancer Brother        Prostate    Reviw of Systems:  Reviewed in the HPI.  All other systems are negative.  Physical Exam: Blood pressure (!) 142/70, pulse (!) 50, height 6\' 2"  (1.88 m), weight 219  lb (99.3 kg), SpO2 98 %.  GEN:  Well nourished, well developed in no acute distress HEENT: Normal NECK: No JVD; No carotid bruits LYMPHATICS: No lymphadenopathy CARDIAC: RRR , no murmurs, rubs, gallops RESPIRATORY:  Clear to auscultation without rales, wheezing or rhonchi  ABDOMEN: Soft, non-tender, non-distended MUSCULOSKELETAL:  No edema; No deformity  SKIN: Warm and dry NEUROLOGIC:  Alert and oriented x 3   ECG:    Assessment / Plan:    1. CAD -    No angina .   S/p CABG  Cont to exercise   2.   Hyperlipidemia :   Trigs are > 500,  LDL [name] 77. Encouraged him to avoid the fatty foods and carbs.   3. HTN:   BP [name] elevated.   Eats lots of salt.  Will start HCTZ 25 mg a day and Kdur 10 meq a day . BMP in 3 weeks APP in 3 months to check BP I'll plan on seeing in 1 year   3.   Carotid artery disease:   Repeat carotid duplex at [facility] ( he [name] already a patient of )       Derrick Bruns, MD  07/17/2020 8:19 AM    [facility] [address] [place],  [address] [place], [place]  [place] Pager [phone] Phone: [phone]; Fax: [phone]

## 2020-07-17 ENCOUNTER — Encounter: Payer: Self-pay | Admitting: Cardiovascular Disease

## 2020-07-17 ENCOUNTER — Other Ambulatory Visit: Payer: Self-pay

## 2020-07-17 ENCOUNTER — Ambulatory Visit (INDEPENDENT_AMBULATORY_CARE_PROVIDER_SITE_OTHER): Payer: Medicare Other | Admitting: Cardiovascular Disease

## 2020-07-17 VITALS — BP 142/70 | HR 50 | Ht 74.0 in | Wt 219.0 lb

## 2020-07-17 DIAGNOSIS — I25700 Atherosclerosis of coronary artery bypass graft(s), unspecified, with unstable angina pectoris: Secondary | ICD-10-CM | POA: Diagnosis not present

## 2020-07-17 DIAGNOSIS — E782 Mixed hyperlipidemia: Secondary | ICD-10-CM | POA: Diagnosis not present

## 2020-07-17 DIAGNOSIS — I10 Essential (primary) hypertension: Secondary | ICD-10-CM | POA: Diagnosis not present

## 2020-07-17 MED ORDER — HYDROCHLOROTHIAZIDE 25 MG PO TABS: 25.0000 mg | ORAL_TABLET | Freq: Every day | ORAL | 3 refills | Status: DC

## 2020-07-17 MED ORDER — POTASSIUM CHLORIDE ER 10 MEQ PO TBCR: 10.0000 meq | EXTENDED_RELEASE_TABLET | Freq: Every day | ORAL | 3 refills | Status: DC

## 2020-07-17 NOTE — Patient Instructions (Signed)
Medication Instructions:  Your physician has recommended you make the following change in your medication:   START hydrochlorothiazide 25mg  daily.  START Potassium (K-DUR) daily.    *If you need a refill on your cardiac medications before your next appointment, please call your pharmacy*   Lab Work: Your physician recommends that you return for lab work in: 3 weeks  You will need to FAST for this appointment - nothing to eat or drink after midnight the night before except water.    Testing/Procedures: none   Follow-Up: At [facility], you and your health needs are our priority.  As part of our continuing mission to provide you with exceptional heart care, we have created designated Provider Care Teams.  These Care Teams include your primary Cardiologist (physician) and Advanced Practice Providers (APPs -  Physician Assistants and Nurse Practitioners) who all work together to provide you with the care you need, when you need it.  We recommend signing up for the patient portal called "MyChart".  Sign up information is provided on this After Visit Summary.  MyChart is used to connect with patients for Virtual Visits (Telemedicine).  Patients are able to view lab/test results, encounter notes, upcoming appointments, etc.  Non-urgent messages can be sent to your provider as well.   To learn more about what you can do with MyChart, go to [facility].    Your next appointment:   3 month(s)  The format for your next appointment:   In Person  Provider:   You will see one of the following Advanced Practice Providers on your designated Care Team:    [email or web address], PA-C  Vin [place], [place]

## 2020-08-06 ENCOUNTER — Telehealth: Payer: Self-pay | Admitting: *Deleted

## 2020-08-06 NOTE — Telephone Encounter (Signed)
Completed phone call/medical record chart review for visit T13, M33 for CLEAR research study. All concomitant medications have been reviewed and updated if applicable. There are no new AE's or SAE's to report to sponsor at this time. Next appointment scheduled for June 15th, 2022 @ 0800am.

## 2020-08-11 ENCOUNTER — Other Ambulatory Visit: Payer: Medicare Other | Admitting: *Deleted

## 2020-08-11 ENCOUNTER — Other Ambulatory Visit: Payer: Self-pay

## 2020-08-11 DIAGNOSIS — I25700 Atherosclerosis of coronary artery bypass graft(s), unspecified, with unstable angina pectoris: Secondary | ICD-10-CM

## 2020-08-11 DIAGNOSIS — E782 Mixed hyperlipidemia: Secondary | ICD-10-CM

## 2020-08-11 LAB — BASIC METABOLIC PANEL
BUN/Creatinine Ratio: 25 — ABNORMAL HIGH (ref 10–24)
BUN: 47 mg/dL — ABNORMAL HIGH (ref 8–27)
CO2: 22 mmol/L (ref 20–29)
Calcium: 10 mg/dL (ref 8.6–10.2)
Chloride: 94 mmol/L — ABNORMAL LOW (ref 96–106)
Creatinine, Ser: 1.89 mg/dL — ABNORMAL HIGH (ref 0.76–1.27)
Glucose: 103 mg/dL — ABNORMAL HIGH (ref 65–99)
Potassium: 4.2 mmol/L (ref 3.5–5.2)
Sodium: 136 mmol/L (ref 134–144)
eGFR: 38 mL/min/{1.73_m2} — ABNORMAL LOW (ref >59)

## 2020-08-12 ENCOUNTER — Telehealth: Payer: Self-pay

## 2020-08-12 DIAGNOSIS — Z79899 Other long term (current) drug therapy: Secondary | ICD-10-CM

## 2020-08-12 MED ORDER — HYDROCHLOROTHIAZIDE 12.5 MG PO CAPS: 12.5000 mg | ORAL_CAPSULE | Freq: Every day | ORAL | 3 refills | Status: DC

## 2020-08-12 NOTE — Telephone Encounter (Signed)
Spoke with patient regarding lab results and new medication orders. Patient verbalized understanding. Repeat BMET scheduled for 09/15/2020. Encouraged patient to contact the office with any questions.

## 2020-08-27 ENCOUNTER — Telehealth: Payer: Self-pay | Admitting: Cardiovascular Disease

## 2020-08-27 NOTE — Telephone Encounter (Signed)
RN spoke to patient regarding upcoming lab work. Patient was aware he needed lab work in April for Dr. Elease Hashimoto, but he was also scheduled for lipids and bmet prior to his appointment with Tereso Newcomer in May. Patient will be out of town for may labs, and wanted to make sure it was okay to have his lipids checked in April when he comes for the bmet. RN advised he is scheduled for both the lipids and bmet on April 19th, and once we receive the results we will let him know if any additional labs are needed prior to his May appointment. Patient verbalized understanding.

## 2020-08-27 NOTE — Telephone Encounter (Signed)
Patient currently has lab work scheduled for 09/15/20.  However, he is requesting additional orders so he can have lab work in April and May.

## 2020-08-28 ENCOUNTER — Other Ambulatory Visit: Payer: Self-pay | Admitting: Cardiovascular Disease

## 2020-08-30 NOTE — Telephone Encounter (Signed)
Agree Please combine the labs for his visit with Tereso Newcomer, PA

## 2020-09-15 ENCOUNTER — Other Ambulatory Visit: Payer: Medicare Other

## 2020-09-15 ENCOUNTER — Other Ambulatory Visit: Payer: Medicare Other | Admitting: *Deleted

## 2020-09-15 ENCOUNTER — Other Ambulatory Visit: Payer: Self-pay

## 2020-09-15 DIAGNOSIS — Z79899 Other long term (current) drug therapy: Secondary | ICD-10-CM

## 2020-09-15 DIAGNOSIS — I25700 Atherosclerosis of coronary artery bypass graft(s), unspecified, with unstable angina pectoris: Secondary | ICD-10-CM

## 2020-09-15 DIAGNOSIS — E782 Mixed hyperlipidemia: Secondary | ICD-10-CM

## 2020-09-15 LAB — LIPID PANEL
Chol/HDL Ratio: 6.8 ratio — ABNORMAL HIGH (ref 0.0–5.0)
Cholesterol, Total: 184 mg/dL (ref 100–199)
HDL: 27 mg/dL — ABNORMAL LOW (ref >39)
LDL Chol Calc (NIH): 93 mg/dL (ref 0–99)
Triglycerides: 385 mg/dL — ABNORMAL HIGH (ref 0–149)
VLDL Cholesterol Cal: 64 mg/dL — ABNORMAL HIGH (ref 5–40)

## 2020-09-15 LAB — BASIC METABOLIC PANEL
BUN/Creatinine Ratio: 20 (ref 10–24)
BUN: 34 mg/dL — ABNORMAL HIGH (ref 8–27)
CO2: 23 mmol/L (ref 20–29)
Calcium: 10.2 mg/dL (ref 8.6–10.2)
Chloride: 98 mmol/L (ref 96–106)
Creatinine, Ser: 1.71 mg/dL — ABNORMAL HIGH (ref 0.76–1.27)
Glucose: 112 mg/dL — ABNORMAL HIGH (ref 65–99)
Potassium: 4.2 mmol/L (ref 3.5–5.2)
Sodium: 138 mmol/L (ref 134–144)
eGFR: 43 mL/min/{1.73_m2} — ABNORMAL LOW (ref >59)

## 2020-09-16 ENCOUNTER — Telehealth: Payer: Self-pay

## 2020-09-16 DIAGNOSIS — E782 Mixed hyperlipidemia: Secondary | ICD-10-CM

## 2020-09-16 DIAGNOSIS — Z79899 Other long term (current) drug therapy: Secondary | ICD-10-CM

## 2020-09-16 NOTE — Telephone Encounter (Signed)
Patient called to discuss lab results. Referral placed for lipid clinic. Patient verbalized understanding.

## 2020-09-16 NOTE — Telephone Encounter (Signed)
-----   Message from Vesta Mixer, MD sent at 09/16/2020  8:20 AM EDT ----- He has a hx of CAD - his LDL goal is between 50-70 Trigs are extremely elevated  Please refer to lipid clinic for consideration of PCSK9 vs. Inclisiran and initiation of Vascepa  He needs to greatly reduce his intake of foods that are high in fats and cholesterol .

## 2020-09-25 DIAGNOSIS — G90529 Complex regional pain syndrome I of unspecified lower limb: Secondary | ICD-10-CM | POA: Insufficient documentation

## 2020-09-25 DIAGNOSIS — M792 Neuralgia and neuritis, unspecified: Secondary | ICD-10-CM | POA: Insufficient documentation

## 2020-09-28 ENCOUNTER — Other Ambulatory Visit: Payer: Self-pay

## 2020-09-28 ENCOUNTER — Emergency Department (HOSPITAL_BASED_OUTPATIENT_CLINIC_OR_DEPARTMENT_OTHER)
Admission: EM | Admit: 2020-09-28 | Discharge: 2020-09-28 | Disposition: A | Discharge status: Home / Self Care | Payer: Medicare Other | Attending: Emergency Medicine | Admitting: Emergency Medicine

## 2020-09-28 ENCOUNTER — Emergency Department (HOSPITAL_BASED_OUTPATIENT_CLINIC_OR_DEPARTMENT_OTHER): Payer: Medicare Other

## 2020-09-28 ENCOUNTER — Encounter (HOSPITAL_BASED_OUTPATIENT_CLINIC_OR_DEPARTMENT_OTHER): Payer: Self-pay | Admitting: *Deleted

## 2020-09-28 DIAGNOSIS — Z7982 Long term (current) use of aspirin: Secondary | ICD-10-CM | POA: Insufficient documentation

## 2020-09-28 DIAGNOSIS — H029 Unspecified disorder of eyelid: Secondary | ICD-10-CM | POA: Diagnosis not present

## 2020-09-28 DIAGNOSIS — Z951 Presence of aortocoronary bypass graft: Secondary | ICD-10-CM | POA: Insufficient documentation

## 2020-09-28 DIAGNOSIS — S61012A Laceration without foreign body of left thumb without damage to nail, initial encounter: Secondary | ICD-10-CM | POA: Diagnosis not present

## 2020-09-28 DIAGNOSIS — I2510 Atherosclerotic heart disease of native coronary artery without angina pectoris: Secondary | ICD-10-CM | POA: Insufficient documentation

## 2020-09-28 DIAGNOSIS — Y92009 Unspecified place in unspecified non-institutional (private) residence as the place of occurrence of the external cause: Secondary | ICD-10-CM | POA: Insufficient documentation

## 2020-09-28 DIAGNOSIS — Z79899 Other long term (current) drug therapy: Secondary | ICD-10-CM | POA: Insufficient documentation

## 2020-09-28 DIAGNOSIS — W010XXA Fall on same level from slipping, tripping and stumbling without subsequent striking against object, initial encounter: Secondary | ICD-10-CM | POA: Insufficient documentation

## 2020-09-28 DIAGNOSIS — Z87891 Personal history of nicotine dependence: Secondary | ICD-10-CM | POA: Insufficient documentation

## 2020-09-28 DIAGNOSIS — Y9301 Activity, walking, marching and hiking: Secondary | ICD-10-CM | POA: Diagnosis not present

## 2020-09-28 DIAGNOSIS — I10 Essential (primary) hypertension: Secondary | ICD-10-CM | POA: Insufficient documentation

## 2020-09-28 DIAGNOSIS — S0181XA Laceration without foreign body of other part of head, initial encounter: Secondary | ICD-10-CM

## 2020-09-28 DIAGNOSIS — H02203 Unspecified lagophthalmos right eye, unspecified eyelid: Secondary | ICD-10-CM

## 2020-09-28 DIAGNOSIS — S0990XA Unspecified injury of head, initial encounter: Secondary | ICD-10-CM | POA: Diagnosis present

## 2020-09-28 DIAGNOSIS — W19XXXA Unspecified fall, initial encounter: Secondary | ICD-10-CM

## 2020-09-28 MED ORDER — ERYTHROMYCIN 5 MG/GM OP OINT
TOPICAL_OINTMENT | Freq: Three times a day (TID) | OPHTHALMIC | Status: DC
  Administered 2020-09-28: 1 via OPHTHALMIC
  Filled 2020-09-28: qty 3.5

## 2020-09-28 NOTE — ED Provider Notes (Signed)
[facility] EMERGENCY DEPARTMENT Provider Note   CSN: [id number] Arrival date & time: 09/28/20  1331     History Chief Complaint  Patient presents with  . Fall    Derrick [name] is a 65 y.o. male presents to the ED for evaluation after fall that occurred around noon today.  Was walking into his house and tripped over the threshold.  He reports left thumb pain and right cheekbone laceration with some bruising.  He was able to stand up on his own after the fall but states he felt like he was going to pass out but did not.  While in the waiting room he felt some nausea but did not vomit.  Nausea has resolved.  Denies headache, visual changes, loss of consciousness, neck pain.  Reports significant bleeding from the facial wound but this has stopped.  No other physical injury.  Denies back pain.  No anticoagulants.  Takes a baby aspirin daily.  Last tetanus within 5-10 years.   HPI     Past Medical History:  Diagnosis Date  . Coronary artery disease 2006   stents x2=LAD LCX; totally occluded RCA b. CABG 03/02/17 x2 (left internal mammary artery to LAD, Y-graft right mammary from left mammary to OM).  . Dyslipidemia   . GERD (gastroesophageal reflux disease)   . History of bronchitis   . Hyperlipidemia    statin intolerant  . Hyperparathyroidism   . Hypertension   . Insomnia   . Metatarsalgia   . Neuropathy   . OA (osteoarthritis)   . Peripheral vascular disease (HCC)   . Seasonal allergies   . Urinary frequency   . Wears glasses     Patient Active Problem List   Diagnosis Date Noted  . HTN (hypertension) 07/17/2020  . Gait abnormality 04/16/2020  . Paresthesia of left lower extremity 04/16/2020  . History of neck surgery 04/16/2020  . Degeneration of lumbar intervertebral disc 08/22/2018  . Chronic otitis externa of left ear 04/02/2018  . Pain in right knee 11/22/2017  . 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 Unstable angina (HCC) 03/01/2017  . Drainage from wound-Left  Groin,Thigh and lower leg 03/27/2015  . Left leg swelling 03/27/2015  . PAD (peripheral artery disease) (HCC) 03/17/2015  . Closed fracture of shaft of metacarpal bone(s) 04/23/2013  . Right rotator cuff tear 04/23/2013  . Coronary artery disease 10/29/2010  . Hyperlipidemia 10/29/2010    Past Surgical History:  Procedure Laterality Date  . CARDIAC CATHETERIZATION  10/28/2004   circ and mid LAD chyper stents  . CERVICAL FUSION  [id number]  . CERVICAL SPINE SURGERY  2004  . COLONOSCOPY    . CORONARY ANGIOPLASTY  2006   stents placed   . CORONARY ARTERY BYPASS GRAFT N/A 03/02/2017   Procedure: CORONARY ARTERY BYPASS GRAFTING (CABG), ON PUMP, TIMES TWO, USING BILATERAL MAMMARY ARTERIES;  Surgeon: Loreli Slot, MD;  Location: [facility] OR;  Service: Open Heart Surgery;  Laterality: N/A;  . ELBOW ARTHROPLASTY  1994   right  . ENDARTERECTOMY FEMORAL Left 03/17/2015   Procedure: ENDARTERECTOMY COMMON FEMORAL WITH PROFUNDOPLASTY;  Surgeon: Sherren Kerns, MD;  Location: [facility] OR;  Service: Vascular;  Laterality: Left;  . FEMORAL-POPLITEAL BYPASS GRAFT Left 03/17/2015   Procedure: BYPASS GRAFT FEMORAL-POPLITEAL ARTERY-LEFT LEG AND POSTERIOR TIBIAL SEQUENTIAL GRAFT TO BELOW KNEE POPLITEAL ARTERY;  Surgeon: Sherren Kerns, MD;  Location: [facility] OR;  Service: Vascular;  Laterality: Left;  . FEMORAL-POPLITEAL BYPASS GRAFT Right 05/11/2015   Procedure:  RIGHT FEMORAL-BELOW THE KNEE POPLITEAL ARTERY BYPASS GRAFT USING NON REVERSE RIGHT GREATER SAPHENOUS VEIN;  Surgeon: Sherren Kerns, MD;  Location: [facility] OR;  Service: Vascular;  Laterality: Right;  . INCISION AND DRAINAGE ABSCESS POSTERIOR CERVICALSPINE  2009  . INTRAOPERATIVE ARTERIOGRAM Left 03/17/2015   Procedure: INTRA OPERATIVE ARTERIOGRAM X2;  Surgeon: Sherren Kerns, MD;  Location: [facility] OR;  Service: Vascular;  Laterality: Left;  . INTRAOPERATIVE ARTERIOGRAM Right 05/11/2015   Procedure: INTRA OPERATIVE ARTERIOGRAM;  Surgeon: Sherren Kerns, MD;   Location: [facility] OR;  Service: Vascular;  Laterality: Right;  . LEFT HEART CATH AND CORONARY ANGIOGRAPHY N/A 03/01/2017   Procedure: LEFT HEART CATH AND CORONARY ANGIOGRAPHY;  Surgeon: Kathleene Hazel, MD;  Location: [facility] INVASIVE CV LAB;  Service: Cardiovascular;  Laterality: N/A;  . PATCH ANGIOPLASTY Left 03/17/2015   Procedure: VEIN PATCH ANGIOPLASTY COMMON FEMORAL ARTERY;  Surgeon: Sherren Kerns, MD;  Location: [facility] OR;  Service: Vascular;  Laterality: Left;  . PERIPHERAL VASCULAR CATHETERIZATION N/A 01/16/2015   Procedure: Abdominal Aortogram;  Surgeon: Sherren Kerns, MD;  Location: [facility] INVASIVE CV LAB;  Service: Cardiovascular;  Laterality: N/A;  . SHOULDER OPEN ROTATOR CUFF REPAIR Right 04/23/2013   Procedure: RIGHT TWO TENDON ROTATOR CUFF REPAIR  SUBACROMIAL DECOMPRESSION AND OPEN DISTAL CLAVICLE RESECTION;  Surgeon: Wyn Forster., MD;  Location: [facility];  Service: Orthopedics;  Laterality: Right;  . stents placed   2006  . TEE WITHOUT CARDIOVERSION N/A 03/02/2017   Procedure: TRANSESOPHAGEAL ECHOCARDIOGRAM (TEE);  Surgeon: Loreli Slot, MD;  Location: [facility] OR;  Service: Open Heart Surgery;  Laterality: N/A;  . VEIN HARVEST Right 05/11/2015   Procedure: WITH NON REVERSE RIGHT GREATER SAPHENOUS VEIN HARVEST;  Surgeon: Sherren Kerns, MD;  Location: [facility] OR;  Service: Vascular;  Laterality: Right;       Family History  Problem Relation Age of Onset  . Arthritis Mother   . Varicose Veins Mother   . Lung cancer Father   . COPD Father        Lung  . Cancer Brother        Lukemia  . COPD Brother   . Cancer Brother        Prostate    Social History   Tobacco Use  . Smoking status: Former Smoker    Types: Cigarettes    Quit date: 05/30/1989    Years since quitting: 31.3  . Smokeless tobacco: Never Used  Vaping Use  . Vaping Use: Never used  Substance Use Topics  . Alcohol use: Yes    Comment: 6 beers per month  . Drug use: Never    Home  Medications Prior to Admission medications   Medication Sig Start Date End Date Taking? Authorizing Provider  aspirin EC 81 MG tablet Take 1 tablet (81 mg total) by mouth daily. 08/31/17  Yes Nahser, Deloris Ping, MD  calcitRIOL (ROCALTROL) 0.5 MCG capsule Take 0.5 mcg by mouth daily.   Yes [provider]  calcium carbonate (OSCAL) 1500 (600 Ca) MG TABS tablet Take 600 mg of elemental calcium 2 (two) times daily with a meal by mouth. Patient takes 2 in the morning and 1 in the evening.   Yes [provider]  Cyanocobalamin (VITAMIN B-12) 2500 MCG SUBL Place 2,500 mcg under the tongue daily.   Yes [provider]  diltiazem (CARDIZEM CD) 120 MG 24 hr capsule TAKE 1 CAPSULE DAILY. PLEASE KEEP UPCOMING APPT IN FEBRUARY 2022 WITH DR.  NAHSER BEFORE ANYMORE REFILLS 08/28/20  Yes Nahser, Deloris PingPhilip J, MD  ezetimibe (ZETIA) 10 MG tablet Take 10 mg by mouth daily.   Yes [provider]  famotidine (PEPCID) 20 MG tablet Take 20 mg by mouth 2 (two) times daily.   Yes [provider]  fenofibrate 160 MG tablet Take 160 mg by mouth daily.   Yes [provider]  gabapentin (NEURONTIN) 300 MG capsule Take 300 mg by mouth at bedtime.   Yes [provider]  hydrochlorothiazide (MICROZIDE) 12.5 MG capsule Take 1 capsule (12.5 mg total) by mouth daily. 08/12/20  Yes Nahser, Deloris PingPhilip J, MD  losartan (COZAAR) 100 MG tablet Take 100 mg by mouth every evening.  11/22/14  Yes [provider]  MAGNESIUM PO Take 250 mg by mouth daily.   Yes [provider]  metoprolol tartrate (LOPRESSOR) 25 MG tablet Take 25 mg by mouth 2 (two) times daily.   Yes [provider]  Omega-3 Fatty Acids (OMEGA-3 EPA FISH OIL PO) Take 2 tablets by mouth 2 (two) times daily.    Yes [provider]  potassium chloride (KLOR-CON) 10 MEQ tablet Take 1 tablet (10 mEq total) by mouth daily. 07/17/20  Yes Nahser, Deloris PingPhilip J, MD  tamsulosin (FLOMAX) 0.4 MG CAPS capsule  Take 0.4 mg by mouth daily.   Yes [provider]  traZODone (DESYREL) 50 MG tablet Take 50 mg by mouth at bedtime.   Yes [provider]  AMBULATORY NON FORMULARY MEDICATION Take 180 mg by mouth 1 day or 1 dose. [name]    Nahser, Deloris PingPhilip J, MD  HYDROcodone-acetaminophen (NORCO/VICODIN) 5-325 MG tablet Take 1 tablet by mouth every 6 (six) hours as needed for moderate pain.    [provider]  ipratropium (ATROVENT) 0.03 % nasal spray Place 1 spray into both nostrils as needed. 07/10/19   [provider]  metoprolol tartrate (LOPRESSOR) 25 MG tablet TAKE 1 TABLET (25 MG TOTAL) BY MOUTH 2 (TWO) TIMES DAILY. 08/28/20   Nahser, Deloris PingPhilip J, MD  neomycin-polymyxin-hydrocortisone (CORTISPORIN) 3.5-10000-1 OTIC suspension Place 3 drops into the left ear as needed. 04/18/19   [provider]  nitroGLYCERIN (NITROSTAT) 0.4 MG SL tablet Place 1 tablet (0.4 mg total) under the tongue every 5 (five) minutes as needed for chest pain. 03/02/20   Nahser, Deloris PingPhilip J, MD    Allergies    Codeine, Atorvastatin, Crestor [rosuvastatin calcium], Meloxicam, and Statins  Review of Systems   Review of Systems  Musculoskeletal: Positive for arthralgias and joint swelling.  Skin: Positive for wound.  All other systems reviewed and are negative.   Physical Exam Updated Vital Signs BP (!) 158/71   Pulse (!) 52   Temp 98.4 F (36.9 C) (Oral)   Resp 18   Ht 6\' 2"  (1.88 m)   Wt 99.3 kg   SpO2 98%   BMI 28.11 kg/m   Physical Exam Vitals and nursing note reviewed.  Constitutional:      General: He is not in acute distress.    Appearance: He is well-developed.     Comments: NAD.  HENT:     Head: Normocephalic.     Comments: Laceration as below     Right Ear: External ear normal.     Left Ear: External ear normal.     Nose: Nose normal.  Eyes:     General: No scleral icterus.    Conjunctiva/sclera: Conjunctivae normal.      Comments:  Approximately 1 inch curved laceration around temporal orbital area, tender.  Hemostatic and not significantly gaping. No involvement of the eyebrow, upper/lower eyelid, eyelid margins, lateral canthus. No involvement of globe, normal conjunctiva/sclera.  Full EOMs without pain. Intact temporal fields. PERRL bilaterally. Upper eyelid with delayed closing. He is able to close it all the way.   Cardiovascular:     Rate and Rhythm: Normal rate and regular rhythm.     Heart sounds: Normal heart sounds. No murmur heard.   Pulmonary:     Effort: Pulmonary effort is normal.     Breath sounds: Normal breath sounds. No wheezing.  Musculoskeletal:        General: No deformity. Normal range of motion.     Cervical back: Normal range of motion and neck supple.  Skin:    General: Skin is warm and dry.     Capillary Refill: Capillary refill takes less than 2 seconds.  Neurological:     Mental Status: He is alert and oriented to person, place, and time.  Psychiatric:        Behavior: Behavior normal.        Thought Content: Thought content normal.        Judgment: Judgment normal.       ED Results / Procedures / Treatments   Labs (all labs ordered are listed, but only abnormal results are displayed) Labs Reviewed - No data to display  EKG None  Radiology CT Head Wo Contrast  Result Date: 09/28/2020 CLINICAL DATA:  Provided history: Fall, right periorbital bruising, abnormal blinking. Patient reports trip and fall today, hitting head and right side of face on hardwood floor. EXAM: CT HEAD WITHOUT CONTRAST CT MAXILLOFACIAL WITHOUT CONTRAST TECHNIQUE: Multidetector CT imaging of the head and maxillofacial structures were performed using the standard protocol without intravenous contrast. Multiplanar CT image reconstructions of the maxillofacial structures were also generated. COMPARISON:  No pertinent prior exams available for comparison. FINDINGS: CT HEAD FINDINGS Brain: Cerebral  volume is normal. There is no acute intracranial hemorrhage. No demarcated cortical infarct. No extra-axial fluid collection. No evidence of intracranial mass. No midline shift. Partially empty sella turcica. Vascular: No hyperdense vessel.  Atherosclerotic calcifications Skull: Normal. Negative for fracture or focal lesion. CT MAXILLOFACIAL FINDINGS Osseous: No acute maxillofacial fracture is identified. Orbits: No acute finding within the orbits. The globes are normal in size and contour. The extraocular muscles and optic nerve sheath complexes are symmetric and unremarkable. Sinuses: Trace right maxillary sinus mucosal thickening. Soft tissues: Right periorbital and maxillofacial hematoma with laceration. Other: Anterior and posterior cervical spinal fusion hardware, partially imaged. IMPRESSION: CT head: No evidence of acute intracranial abnormality. CT maxillofacial: 1. No evidence of acute maxillofacial fracture. 2. Right periorbital and maxillofacial hematoma with laceration. Electronically Signed   By: Jackey Loge DO   On: 09/28/2020 17:30   DG Hand Complete Left  Result Date: 09/28/2020 CLINICAL DATA:  Recent fall with hand pain, initial encounter EXAM: LEFT HAND - COMPLETE 3+ VIEW COMPARISON:  09/14/2009 FINDINGS: Progressive degenerative changes are noted the first [facility] joint as well as within the interphalangeal joints. No erosive changes are identified. No ulnar deviation is seen. Mild soft tissue swelling about the interphalangeal joints is seen. No acute fractures are seen. IMPRESSION: Progressive degenerative change when compared with the prior exam. No acute fracture is noted. Electronically Signed   By: Alcide Clever M.D.   On: 09/28/2020 14:28   CT Maxillofacial Wo Contrast  Result Date: 09/28/2020 CLINICAL DATA:  Provided history: Fall, right periorbital bruising, abnormal blinking. Patient reports trip and fall today, hitting head and right side of face on hardwood floor. EXAM: CT HEAD  WITHOUT CONTRAST CT MAXILLOFACIAL WITHOUT CONTRAST TECHNIQUE: Multidetector CT imaging of the head and maxillofacial structures were performed using the standard protocol without intravenous contrast. Multiplanar CT image reconstructions of the maxillofacial structures were also generated. COMPARISON:  No pertinent prior exams available for comparison. FINDINGS: CT HEAD FINDINGS Brain: Cerebral volume is normal. There is no acute intracranial hemorrhage. No demarcated cortical infarct. No extra-axial fluid collection. No evidence of intracranial mass. No midline shift. Partially empty sella turcica. Vascular: No hyperdense vessel.  Atherosclerotic calcifications Skull: Normal. Negative for fracture or focal lesion. CT MAXILLOFACIAL FINDINGS Osseous: No acute maxillofacial fracture is identified. Orbits: No acute finding within the orbits. The globes are normal in size and contour. The extraocular muscles and optic nerve sheath complexes are symmetric and unremarkable. Sinuses: Trace right maxillary sinus mucosal thickening. Soft tissues: Right periorbital and maxillofacial hematoma with laceration. Other: Anterior and posterior cervical spinal fusion hardware, partially imaged. IMPRESSION: CT head: No evidence of acute intracranial abnormality. CT maxillofacial: 1. No evidence of acute maxillofacial fracture. 2. Right periorbital and maxillofacial hematoma with laceration. Electronically Signed   By: Jackey Loge DO   On: 09/28/2020 17:30    Procedures Procedures   Medications Ordered in ED Medications  erythromycin ophthalmic ointment (1 application Right Eye Given 09/28/20 1909)    ED Course  I have reviewed the triage vital signs and the nursing notes.  Pertinent labs & imaging results that were available during my care of the patient were reviewed by me and considered in my medical decision making (see chart for details).    MDM Rules/Calculators/A&P                          65 yo M presents to  the ED for evaluation of mechanical fall. He sustained a laceration on lateral orbital bone and left thumb pain.    No red flags like LOC, severe headache. No other physical injuries reported.  Exam reveals lag, delay closure of right upper eyelid however normal EOMs without pain. PERRL bilaterally.  No evidence of entrapment. No significant periorbital soft tissue edema. No involvement of the globe.    CT maxillofacial/head without acute findings only shows periorbital and maxillofacial hematoma.  Discussed physical exam findings with EDP Curatolo who also evaluated patient. I consulted ophthalmologist Dr Cathey Endow who recommended no wound closure here. Recommended placing erythromycin ointment over the wound and applying a dressing.  Recommended clinic follow tomorrow morning at 8:15 am and to instruct patient to apply erythromycin ointment on the wound TID and inside the eye before sleeping. He suspects findings maybe from hematoma.    Discussed plan with patient and wife who are in agreement with ER POC and discharge plan to follow up with Dr Cathey Endow. Discussed with EDP.  Final Clinical Impression(s) / ED Diagnoses Final diagnoses:  Fall, initial encounter  Facial laceration, initial encounter  Eyelid closing impairment, right    Rx / DC Orders ED Discharge Orders    None       Liberty Handy, PA-C 09/28/20 1946    Virgina Norfolk, DO 09/28/20 2342

## 2020-09-28 NOTE — ED Provider Notes (Signed)
I personally evaluated the patient during the encounter and completed a history, physical, procedures, medical decision making to contribute to the overall care of the patient and decision making for the patient briefly, the patient is a 65 y.o. male here after fall.  Laceration to right side of his face from eyebrow down around the outside of the eye.  Does not involve the eyelid or canthus.  Normal extraocular movements.  Not on blood thinners.  Does have some bruising to the right side of the face.  CT scan showed no fractures.  He does have some slow closure of his upper eyelid.  Suspect may be hit small portion of a peripheral nerve for small part of a muscle causing this.  Could be due to the swelling and inflammation.  But he is able to fully open and close his eyes but there is a delay with closure of the upper eyelid.  Otherwise exam is unremarkable.  We will clean out wound repair.  We will have patient follow-up with ophthalmology.  We will give artificial tears.  This chart was dictated using voice recognition software.  Despite best efforts to proofread,  errors can occur which can change the documentation meaning.     EKG Interpretation None          Virgina Norfolk, DO 09/28/20 1753

## 2020-09-28 NOTE — ED Notes (Signed)
Patient transported to CT 

## 2020-09-28 NOTE — ED Triage Notes (Signed)
He tripped and fell this afternoon. Laceration and bruising to his right eye. Bruising and pain to his left hand.

## 2020-09-28 NOTE — Discharge Instructions (Addendum)
You were seen in the ER for fall and facial laceration and left thumb pain  X-ray of left hand did not reveal a fracture.  You did not have any point tenderness of your wrist bone (scaphoid).  I suspect you have a deep bruise.  Ice, take ibuprofen/acetaminophen as needed for pain.  For pain and inflammation you can use a combination of ibuprofen and acetaminophen.    Take [phone] mg acetaminophen (tylenol) every 6 hours or 600 mg ibuprofen (advil, motrin) every 6 hours.  You can take these separately or combine them every 6 hours for maximum pain control. Do not exceed 4,000 mg acetaminophen or 2,400 mg ibuprofen in a 24 hour period.  Do not take ibuprofen containing products if you have history of kidney disease, ulcers, GI bleeding, severe acid reflux, or take a blood thinner.  Do not take acetaminophen if you have liver disease.   CT maxillofacial and head did not reveal fractures. You have a hematoma on the right side of your face and eye.    We noted a deficit on your upper eyelid with closing. We discussed this with eye doctor Dr Cathey Endow - please go to his office tomorrow at 8:15 am for re-evaluation.   Apply thin layer of erythromycin ointment to the wound three times daily.  Apply 1/2 inch ribbon of erythromycin ointment inside the eye before bed time and three times daily

## 2020-10-07 ENCOUNTER — Other Ambulatory Visit: Payer: Self-pay

## 2020-10-07 ENCOUNTER — Ambulatory Visit (INDEPENDENT_AMBULATORY_CARE_PROVIDER_SITE_OTHER): Payer: Medicare Other | Admitting: Pharmacist

## 2020-10-07 DIAGNOSIS — I25700 Atherosclerosis of coronary artery bypass graft(s), unspecified, with unstable angina pectoris: Secondary | ICD-10-CM | POA: Diagnosis not present

## 2020-10-07 DIAGNOSIS — G720 Drug-induced myopathy: Secondary | ICD-10-CM | POA: Diagnosis not present

## 2020-10-07 DIAGNOSIS — E782 Mixed hyperlipidemia: Secondary | ICD-10-CM | POA: Diagnosis not present

## 2020-10-07 DIAGNOSIS — T466X5A Adverse effect of antihyperlipidemic and antiarteriosclerotic drugs, initial encounter: Secondary | ICD-10-CM | POA: Diagnosis not present

## 2020-10-07 MED ORDER — PRAVASTATIN SODIUM 20 MG PO TABS: 20.0000 mg | ORAL_TABLET | Freq: Every evening | ORAL | 11 refills | Status: DC

## 2020-10-07 NOTE — Progress Notes (Signed)
Patient ID: Derrick [name]                 DOB: 08-07-1955                    MRN: [id number]     HPI: Derrick [name] is a 65 y.o. male patient referred to lipid clinic by Dr Elease Hashimoto. PMH is significant for CAD s/p stents to LAD and LCx, chronically occluded RCA in 2006, CABG in 2018, PVD, HLD with statin intolerance, and HTN. Pt is also enrolled in the CLEAR trial investigating bempedoic acid - still on study drug.  Pt presents today in good spirits. Reports taking rosuvastatin and atorvastatin 10-15 years ago. Does not recall doses but experienced muscle/joint pain after about 1 month of therapy for each of them. Symptoms resolved on statin d/c in each case. Reports he was fasting when labs were drawn last month and TG were 385. Previously > 500 at his PCP office earlier this year.   Retired about a year ago. Hasn't had healthy eating habits since then. Was goin to [employer] for biscuit most mornings. Recently has tried to eat cereal in the AM instead. Donzetta Sprung fish and chicken. Will get a regular coke or juice when he's out for fast food but doesn't drink soda at home. Minimal alcohol.  Current Medications: ezetimibe 10mg  daily, fenofibrate 160mg  daily, fish oil 2g BID Intolerances: atorvastatin, rosuvastatin - myalgias Risk Factors: CAD s/p PCI, CABG, PVD  LDL goal: 55mg /dL  Diet:  Breakfast - honey nut cheerios. Was eating biscuits regularly at [employer] - sausage and egg regularly before Lunch - PB or baloney sandwich. Sometimes will get a hamburger maybe once a week Dinner - meatloaf, pinto bean casserole, fried chicken, fried fish Snacks - peanuts, cashews, almonds, raisins Drinks - water and 2% milk, sometimes a regular coke or OJ if he's eating out but none at home   Exercise: Rides exercise bike for 20 minutes every morning  Family History: Non-significant.  Social History: Former tobacco use, quit in 1991. Occasional alcohol use - 1 beer every 6 months or  so.  Labs: 09/15/20: TC 184, TG 385, HDL 27, LDL 93 (ezetimibe 10mg  daily, fenofibrate 160mg  daily, fish oil 2g BID) 06/05/20: TC 192, TG 564, HDL 7.6, LDL 77 (falsely low due to elevated TG), nonHDL 167 - same meds  Past Medical History:  Diagnosis Date  . Coronary artery disease 2006   stents x2=LAD LCX; totally occluded RCA b. CABG 03/02/17 x2 (left internal mammary artery to LAD, Y-graft right mammary from left mammary to OM).  . Dyslipidemia   . GERD (gastroesophageal reflux disease)   . History of bronchitis   . Hyperlipidemia    statin intolerant  . Hyperparathyroidism   . Hypertension   . Insomnia   . Metatarsalgia   . Neuropathy   . OA (osteoarthritis)   . Peripheral vascular disease (HCC)   . Seasonal allergies   . Urinary frequency   . Wears glasses     Current Outpatient Medications on File Prior to Visit  Medication Sig Dispense Refill  . AMBULATORY NON FORMULARY MEDICATION Take 180 mg by mouth 1 day or 1 dose. [name]    . aspirin EC 81 MG tablet Take 1 tablet (81 mg total) by mouth daily.    . calcitRIOL (ROCALTROL) 0.5 MCG capsule Take 0.5 mcg by mouth daily.    . calcium carbonate (OSCAL) 1500 (600 Ca)  MG TABS tablet Take 600 mg of elemental calcium 2 (two) times daily with a meal by mouth. Patient takes 2 in the morning and 1 in the evening.    . Cyanocobalamin (VITAMIN B-12) 2500 MCG SUBL Place 2,500 mcg under the tongue daily.    Marland Kitchen diltiazem (CARDIZEM CD) 120 MG 24 hr capsule TAKE 1 CAPSULE DAILY. PLEASE KEEP UPCOMING APPT IN FEBRUARY 2022 WITH DR. Elease Hashimoto BEFORE ANYMORE REFILLS 90 capsule 2  . ezetimibe (ZETIA) 10 MG tablet Take 10 mg by mouth daily.    . famotidine (PEPCID) 20 MG tablet Take 20 mg by mouth 2 (two) times daily.    . fenofibrate 160 MG tablet Take 160 mg by mouth daily.    Marland Kitchen gabapentin (NEURONTIN) 300 MG capsule Take 300 mg by mouth at bedtime.    . hydrochlorothiazide (MICROZIDE) 12.5 MG capsule Take 1  capsule (12.5 mg total) by mouth daily. 90 capsule 3  . HYDROcodone-acetaminophen (NORCO/VICODIN) 5-325 MG tablet Take 1 tablet by mouth every 6 (six) hours as needed for moderate pain.    Marland Kitchen ipratropium (ATROVENT) 0.03 % nasal spray Place 1 spray into both nostrils as needed.    Marland Kitchen losartan (COZAAR) 100 MG tablet Take 100 mg by mouth every evening.     Marland Kitchen MAGNESIUM PO Take 250 mg by mouth daily.    . metoprolol tartrate (LOPRESSOR) 25 MG tablet TAKE 1 TABLET (25 MG TOTAL) BY MOUTH 2 (TWO) TIMES DAILY. 180 tablet 2  . metoprolol tartrate (LOPRESSOR) 25 MG tablet Take 25 mg by mouth 2 (two) times daily.    Marland Kitchen neomycin-polymyxin-hydrocortisone (CORTISPORIN) 3.5-10000-1 OTIC suspension Place 3 drops into the left ear as needed.    . nitroGLYCERIN (NITROSTAT) 0.4 MG SL tablet Place 1 tablet (0.4 mg total) under the tongue every 5 (five) minutes as needed for chest pain. 75 tablet 1  . Omega-3 Fatty Acids (OMEGA-3 EPA FISH OIL PO) Take 2 tablets by mouth 2 (two) times daily.     . potassium chloride (KLOR-CON) 10 MEQ tablet Take 1 tablet (10 mEq total) by mouth daily. 90 tablet 3  . tamsulosin (FLOMAX) 0.4 MG CAPS capsule Take 0.4 mg by mouth daily.    . traZODone (DESYREL) 50 MG tablet Take 50 mg by mouth at bedtime.     No current facility-administered medications on file prior to visit.    Allergies  Allergen Reactions  . Codeine Itching  . Atorvastatin Other (See Comments)    Muscle aches Muscle aches  . Crestor [Rosuvastatin Calcium]     Muscle aches  . Meloxicam Other (See Comments)    Can not take due to kidneys  . Statins Other (See Comments)    myalgia    Assessment/Plan:  1. Hyperlipidemia - LDL 93 above goal < 55 given progressive and extensive CAD. TG 385 above goal < 150. Pt currently on ezetimibe 10mg  daily, fenofibrate 160mg  daily, and fish oil 2g BID - will continue these meds. Unfortunately his [employer] Part D plan does not cover branded meds well at all. Repatha, Vascepa, and  Lovaza are all cost prohibitive as Tier 3 meds which have a $450 deductible and then a 17% copay. Pt is willing to rechallenge with low dose pravastatin 20mg  daily. Will also submit benefit investigation form for Leqvio since he also has a policy that should cover Leqvio well. Will follow up with pt once I hear back on coverage information. Encouraged pt to decrease biscuits, fried chicken/fish, soda, and juice  in his diet to help lower his LDL and TG. Also sent message to research team to make sure I'm able to adjust pt's lipid meds while he finishes up enrollment in CLEAR trial.  Danie Diehl E. Eisa Necaise, PharmD, BCACP, CPP [facility] [address] [place], [place] [address] Phone: [phone]; Fax: [phone] 10/07/2020 10:36 AM

## 2020-10-07 NOTE — Patient Instructions (Addendum)
It was nice to meet you today  Your LDL is 93 and your goal is < 55 -Start pravastatin 20mg  - 1 tablet every evening. Keep an eye out for muscle pain and let me know if you experience any -I'll submit information to your insurance to see what coverage for Leqvio injections looks like. This medicine is given at month 0, 3, then every 6 months at [facility] and lowers your LDL cholesterol by 50% -Continue taking ezetimibe 10mg  daily  Your triglycerides are 385 and your goal is < 150 -Continue taking fenofibrate 160mg  daily and fish oil  -Decrease your biscuits and sausage in the morning, substituting with cereal is great. Try to grill or bake your chicken and fish instead of frying it. Get a diet coke instead of regular coke when you're eating out at restaurants, and try to limit your juice as well  Call [facility], Pharmacist with any questions #[phone]

## 2020-10-08 ENCOUNTER — Telehealth: Payer: Self-pay | Admitting: Pharmacist

## 2020-10-08 NOTE — Telephone Encounter (Signed)
Received benefit investigation for Leqvio - covered through Medicare Part B plan at 80%. He has a supplement which covers the remaining 20% coinsurance. He does have an annual [phone] deductible which his supplement does not cover - this would need to be paid each year and he has already paid it in full for 2022 year. Prior auth not needed.  Called pt to advise him that he will be able to start [facility] for free and that in future years he'll have to pay through $233 deductible like he already has this year, then med will be free again. He is aware [facility] still has Leqvio scheduling on hold and we will contact him when we're able to start scheduling, hopefully in the next month or so.

## 2020-10-16 ENCOUNTER — Other Ambulatory Visit: Payer: Medicare Other

## 2020-10-21 ENCOUNTER — Other Ambulatory Visit: Payer: Medicare Other

## 2020-10-22 NOTE — Progress Notes (Addendum)
Cardiology Office Note:    Date:  10/23/2020   ID:  Derrick [name], DOB [birth year], MRN [id number]  PCP:  Derrick Pilon, [name]   [facility] Providers Cardiologist:  Derrick Miss, MD      Referring MD: Derrick Pilon, [name]   Chief Complaint:  Follow-up (CAD, HTN)    Patient Profile:    Derrick [name] is a 65 y.o. male with:   Coronary artery disease  ? Hx PCI to LAD and LCx ? S/p CABG in 2018 ? Post op atrial fibrillation   PAD ? S/p L Fem-Post Tib bypass 2016 ? S/p R Fem-Pop 2016 ? S/p bilat CIA stents in 2016  Carotid artery Dz  [place] 12/21: bilat ICA 1-39  Hyperlipidemia   Hypertension   Chronic kidney disease   Sinus brady   Prior CV studies: VAS US CAROTID DUPLEX BILATERAL 04/30/2020 Summary: Right Carotid: Velocities in the right ICA are consistent with a 1-39% stenosis. Left Carotid: Velocities in the left ICA are consistent with a 1-39% stenosis. Vertebrals:  Bilateral vertebral arteries demonstrate antegrade flow. Subclavians: Right subclavian artery was stenotic. Normal flow hemodynamics were seen in the left subclavian artery.  Cardiac catheterization 03/01/17 1. Severe triple vessel CAD 2. Severe ostial left main stenosis with dampening of pressures with catheter engagement.  3. Patent stent mid LAD 4. Patent stent mid Circumflex with moderate disease in the proximal Circumflex prior to the stent.  5. Chronic occlusion RCA with filling of distal branches by left to right collaterals.  6. Normal LV systolic function  Recommendations: He has high grade stenosis of the ostial left main artery and filling of the RCA from left to right collaterals. CABG is the best method for revascularization given his anatomy. Will consult CT surgery.     History of Present Illness: Derrick [name] was last seen by Dr. Elease [name] in 2/22.  HCTZ was added for BP. His triglycerides remain high and he did [facility].  He is getting approved for  Inclisiran.  He returns for f/u.  He is here alone.  Overall, he has been doing well without chest pain, shortness of breath, syncope.  He did trip and fall several weeks ago and hit his head.  He feels that he did blackout for a few seconds after hitting his head on the floor.  He had a laceration above his right eye and was seen in the emergency room.  CT was negative for bleed.  He saw an ophthalmologist the next day and the wound was sutured.    Past Medical History:  Diagnosis Date  . Coronary artery disease 2006   stents x2=LAD LCX; totally occluded RCA b. CABG 03/02/17 x2 (left internal mammary artery to LAD, Y-graft right mammary from left mammary to OM).  . Dyslipidemia   . GERD (gastroesophageal reflux disease)   . History of bronchitis   . Hyperlipidemia    statin intolerant  . Hyperparathyroidism   . Hypertension   . Insomnia   . Metatarsalgia   . Neuropathy   . OA (osteoarthritis)   . Peripheral vascular disease (HCC)   . Seasonal allergies   . Urinary frequency   . Wears glasses     Current Medications: Current Meds  Medication Sig  . AMBULATORY NON FORMULARY MEDICATION Take 180 mg by mouth 1 day or 1 dose. [name]  . amLODipine (NORVASC) 5 MG tablet Take 1 tablet (5 mg total)  by mouth daily.  Marland Kitchen aspirin EC 81 MG tablet Take 1 tablet (81 mg total) by mouth daily.  . calcitRIOL (ROCALTROL) 0.5 MCG capsule Take 0.5 mcg by mouth daily.  . calcium carbonate (OSCAL) 1500 (600 Ca) MG TABS tablet Take 600 mg of elemental calcium 2 (two) times daily with a meal by mouth. Patient takes 2 in the morning and 1 in the evening.  . Cyanocobalamin (VITAMIN B-12) 2500 MCG SUBL Place 2,500 mcg under the tongue daily.  Marland Kitchen ezetimibe (ZETIA) 10 MG tablet Take 10 mg by mouth daily.  . famotidine (PEPCID) 20 MG tablet Take 20 mg by mouth 2 (two) times daily.  . fenofibrate 160 MG tablet Take 160 mg by mouth daily.  Marland Kitchen gabapentin (NEURONTIN) 300 MG  capsule Take 300 mg by mouth at bedtime.  . hydrochlorothiazide (MICROZIDE) 12.5 MG capsule Take 1 capsule (12.5 mg total) by mouth daily.  Marland Kitchen ipratropium (ATROVENT) 0.03 % nasal spray Place 1 spray into both nostrils as needed.  Marland Kitchen losartan (COZAAR) 100 MG tablet Take 100 mg by mouth every evening.   Marland Kitchen MAGNESIUM PO Take 250 mg by mouth daily.  . metoprolol tartrate (LOPRESSOR) 25 MG tablet TAKE 1 TABLET (25 MG TOTAL) BY MOUTH 2 (TWO) TIMES DAILY.  Marland Kitchen neomycin-polymyxin-hydrocortisone (CORTISPORIN) 3.5-10000-1 OTIC suspension Place 3 drops into the left ear as needed.  . nitroGLYCERIN (NITROSTAT) 0.4 MG SL tablet Place 1 tablet (0.4 mg total) under the tongue every 5 (five) minutes as needed for chest pain.  . Omega-3 Fatty Acids (OMEGA-3 EPA FISH OIL PO) Take 2 tablets by mouth 2 (two) times daily.   . potassium chloride (KLOR-CON) 10 MEQ tablet Take 1 tablet (10 mEq total) by mouth daily.  . pravastatin (PRAVACHOL) 20 MG tablet Take 1 tablet (20 mg total) by mouth every evening.  . tamsulosin (FLOMAX) 0.4 MG CAPS capsule Take 0.4 mg by mouth daily.  . traZODone (DESYREL) 50 MG tablet Take 50 mg by mouth at bedtime.  . [DISCONTINUED] diltiazem (CARDIZEM CD) 120 MG 24 hr capsule TAKE 1 CAPSULE DAILY. PLEASE KEEP UPCOMING APPT IN FEBRUARY 2022 WITH DR. Elease [name] BEFORE ANYMORE REFILLS     Allergies:   Codeine, Atorvastatin, Crestor [rosuvastatin calcium], Meloxicam, and Statins   Social History   Tobacco Use  . Smoking status: Former Smoker    Types: Cigarettes    Quit date: 05/30/1989    Years since quitting: 31.4  . Smokeless tobacco: Never Used  Vaping Use  . Vaping Use: Never used  Substance Use Topics  . Alcohol use: Yes    Comment: 6 beers per month  . Drug use: Never     Family Hx: The patient's family history includes Arthritis in his mother; COPD in his brother and father; Cancer in his brother and brother; Lung cancer in his father; Varicose Veins in his mother.  Review of  Systems  Gastrointestinal: Negative for hematochezia.  Genitourinary: Negative for hematuria.     EKGs/Labs/Other Test Reviewed:    EKG:  EKG is notn ordered today.  The ekg ordered today demonstrates /a  Recent Labs: 04/29/2020: ALT 22; Hemoglobin 13.4; Platelets 319; TSH 1.970 09/15/2020: BUN 34; Creatinine, Ser 1.71; Potassium 4.2; Sodium 138   Recent Lipid Panel Lab Results  Component Value Date/Time   CHOL 184 09/15/2020 07:39 AM   TRIG 385 (H) 09/15/2020 07:39 AM   HDL 27 (L) 09/15/2020 07:39 AM   LDLCALC 93 09/15/2020 07:39 AM      Risk Assessment/Calculations:  Physical Exam:    VS:  BP 138/60   Pulse 63   Ht 6\' 2"  (1.88 m)   Wt 212 lb 9.6 oz (96.4 kg)   SpO2 98%   BMI 27.30 kg/m     Wt Readings from Last 3 Encounters:  10/23/20 212 lb 9.6 oz (96.4 kg)  09/28/20 218 lb 14.7 oz (99.3 kg)  07/17/20 219 lb (99.3 kg)     Constitutional:      Appearance: Healthy appearance. Not in distress.  Neck:     Vascular: JVD normal.  Pulmonary:     Effort: Pulmonary effort is normal.     Breath sounds: No wheezing. No rales.  Cardiovascular:     Normal rate. Regular rhythm. Normal S1. Normal S2.     Murmurs: There is no murmur.  Edema:    Ankle: trace edema of the left ankle. Abdominal:     Palpations: Abdomen is soft. There is no hepatomegaly.  Skin:    General: Skin is warm and dry.  Neurological:     General: No focal deficit present.     Mental Status: Alert and oriented to person, place and time.     Cranial Nerves: Cranial nerves are intact.         ASSESSMENT & PLAN:    1. Coronary artery disease involving coronary bypass graft of native heart with unstable angina pectoris (HCC) History of prior stenting to the LAD and LCx and subsequent CABG in 2018.  He is doing well without angina.  Continue ASA, statin.   2. Mixed hyperlipidemia Triglycerides significantly elevated.  He has seen the Lipid Clinic.  He is getting set up to start Inclisiran.     3. Essential hypertension BP remains above target. HR has been in the 50-60s.  He thinks he has been on both metoprolol and diltiazem since his CABG when he had post op AFib.  I do not think he can tolerate an increase in Diltiazem or Metoprolol.  I will DC his Diltiazem and replace it with Amlodipine.  If BP does not reach target on max dose Amlodipine, we may need to add Hydralazine or Terazosin.    -DC Diltiazem  -Start Amlodipine 5 mg once daily   -Monitor BP and send readings after 2 weeks  -Low Na diet  4. Bilateral extracranial carotid artery stenosis Mild disease on carotid Dopplers in 12/21.  5. Stage 3b chronic kidney disease (HCC) He is followed by Dr. 1/22 with nephrology.  Dispo:  Return in about 3 months (around 01/23/2021) for Routine Follow Up, w/ 01/25/2021, PA-C.   Medication Adjustments/Labs and Tests Ordered: Current medicines are reviewed at length with the patient today.  Concerns regarding medicines are outlined above.  Tests Ordered: No orders of the defined types were placed in this encounter.  Medication Changes: Meds ordered this encounter  Medications  . amLODipine (NORVASC) 5 MG tablet    Sig: Take 1 tablet (5 mg total) by mouth daily.    Dispense:  30 tablet    Refill:  [address], [address], [place]er  10/23/2020 9:07 AM    [facility] [address] [place], [place], [place]  [place] Phone: [phone]; Fax: [phone]

## 2020-10-23 ENCOUNTER — Encounter: Payer: Self-pay | Admitting: Physician Assistant

## 2020-10-23 ENCOUNTER — Other Ambulatory Visit: Payer: Self-pay

## 2020-10-23 ENCOUNTER — Ambulatory Visit (INDEPENDENT_AMBULATORY_CARE_PROVIDER_SITE_OTHER): Payer: Medicare Other | Admitting: Physician Assistant

## 2020-10-23 VITALS — BP 138/60 | HR 63 | Ht 74.0 in | Wt 212.6 lb

## 2020-10-23 DIAGNOSIS — E782 Mixed hyperlipidemia: Secondary | ICD-10-CM | POA: Diagnosis not present

## 2020-10-23 DIAGNOSIS — I25700 Atherosclerosis of coronary artery bypass graft(s), unspecified, with unstable angina pectoris: Secondary | ICD-10-CM | POA: Diagnosis not present

## 2020-10-23 DIAGNOSIS — I6523 Occlusion and stenosis of bilateral carotid arteries: Secondary | ICD-10-CM | POA: Diagnosis not present

## 2020-10-23 DIAGNOSIS — I10 Essential (primary) hypertension: Secondary | ICD-10-CM | POA: Diagnosis not present

## 2020-10-23 DIAGNOSIS — N1832 Chronic kidney disease, stage 3b: Secondary | ICD-10-CM

## 2020-10-23 MED ORDER — AMLODIPINE BESYLATE 5 MG PO TABS: 5.0000 mg | ORAL_TABLET | Freq: Every day | ORAL | 11 refills | Status: DC

## 2020-10-23 NOTE — Patient Instructions (Signed)
Medication Instructions:  Your physician has recommended you make the following change in your medication:   1.  Discontinue Diltiazem  2.  Start Amlodipine one tablet by mouth ( 5 mg) daily, sent in to requested pharmacy.     *If you need a refill on your cardiac medications before your next appointment, please call your pharmacy*   Lab Work: -None   If you have labs (blood work) drawn today and your tests are completely normal, you will receive your results only by: Marland Kitchen MyChart Message (if you have MyChart) OR . A paper copy in the mail If you have any lab test that is abnormal or we need to change your treatment, we will call you to review the results.   Testing/Procedures: -None   Follow-Up: At [facility], you and your health needs are our priority.  As part of our continuing mission to provide you with exceptional heart care, we have created designated Provider Care Teams.  These Care Teams include your primary Cardiologist (physician) and Advanced Practice Providers (APPs -  Physician Assistants and Nurse Practitioners) who all work together to provide you with the care you need, when you need it.  We recommend signing up for the patient portal called "MyChart".  Sign up information is provided on this After Visit Summary.  MyChart is used to connect with patients for Virtual Visits (Telemedicine).  Patients are able to view lab/test results, encounter notes, upcoming appointments, etc.  Non-urgent messages can be sent to your provider as well.   To learn more about what you can do with MyChart, go to [email or web address].    Your next appointment:   3 month(s) on Tuesday, August 30 @ 8:45 am.   The format for your next appointment:   In Person  Provider:   Tereso Newcomer, PA-C   Other Instructions  Please check you blood pressure X 2 weeks and send in readings on mychart.   Low-Sodium Eating Plan Sodium, which is an element that makes up salt, helps you  maintain a healthy balance of fluids in your body. Too much sodium can increase your blood pressure and cause fluid and waste to be held in your body. Your health care provider or dietitian may recommend following this plan if you have high blood pressure (hypertension), kidney disease, liver disease, or heart failure. Eating less sodium can help lower your blood pressure, reduce swelling, and protect your heart, liver, and kidneys. What are tips for following this plan? Reading food labels  The Nutrition Facts label lists the amount of sodium in one serving of the food. If you eat more than one serving, you must multiply the listed amount of sodium by the number of servings.  Choose foods with less than 140 mg of sodium per serving.  Avoid foods with 300 mg of sodium or more per serving. Shopping  Look for lower-sodium products, often labeled as "low-sodium" or "no salt added."  Always check the sodium content, even if foods are labeled as "unsalted" or "no salt added."  Buy fresh foods. ? Avoid canned foods and pre-made or frozen meals. ? Avoid canned, cured, or processed meats.  Buy breads that have less than 80 mg of sodium per slice.   Cooking  Eat more home-cooked food and less restaurant, buffet, and fast food.  Avoid adding salt when cooking. Use salt-free seasonings or herbs instead of table salt or sea salt. Check with your health care provider or pharmacist before using salt substitutes.  Derrick [name]  with plant-based oils, such as canola, sunflower, or olive oil.   Meal planning  When eating at a restaurant, ask that your food be prepared with less salt or no salt, if possible. Avoid dishes labeled as brined, pickled, cured, smoked, or made with soy sauce, miso, or teriyaki sauce.  Avoid foods that contain MSG (monosodium glutamate). MSG is sometimes added to [place] food, bouillon, and some canned foods.  Make meals that can be grilled, baked, poached, roasted, or steamed.  These are generally made with less sodium. General information Most people on this plan should limit their sodium intake to 1,500-2,000 mg (milligrams) of sodium each day. What foods should I eat? Fruits Fresh, frozen, or canned fruit. Fruit juice. Vegetables Fresh or frozen vegetables. "No salt added" canned vegetables. "No salt added" tomato sauce and paste. Low-sodium or reduced-sodium tomato and vegetable juice. Grains Low-sodium cereals, including oats, puffed wheat and rice, and shredded wheat. Low-sodium crackers. Unsalted rice. Unsalted pasta. Low-sodium bread. Whole-grain breads and whole-grain pasta. Meats and other proteins Fresh or frozen (no salt added) meat, poultry, seafood, and fish. Low-sodium canned tuna and salmon. Unsalted nuts. Dried peas, beans, and lentils without added salt. Unsalted canned beans. Eggs. Unsalted nut butters. Dairy Milk. Soy milk. Cheese that is naturally low in sodium, such as ricotta cheese, fresh mozzarella, or Swiss cheese. Low-sodium or reduced-sodium cheese. Cream cheese. Yogurt. Seasonings and condiments Fresh and dried herbs and spices. Salt-free seasonings. Low-sodium mustard and ketchup. Sodium-free salad dressing. Sodium-free light mayonnaise. Fresh or refrigerated horseradish. Lemon juice. Vinegar. Other foods Homemade, reduced-sodium, or low-sodium soups. Unsalted popcorn and pretzels. Low-salt or salt-free chips. The items listed above may not be a complete list of foods and beverages you can eat. Contact a dietitian for more information. What foods should I avoid? Vegetables Sauerkraut, pickled vegetables, and relishes. Olives. [place] fries. Onion rings. Regular canned vegetables (not low-sodium or reduced-sodium). Regular canned tomato sauce and paste (not low-sodium or reduced-sodium). Regular tomato and vegetable juice (not low-sodium or reduced-sodium). Frozen vegetables in sauces. Grains Instant hot cereals. Bread stuffing, pancake,  and biscuit mixes. Croutons. Seasoned rice or pasta mixes. Noodle soup cups. Boxed or frozen macaroni and cheese. Regular salted crackers. Self-rising flour. Meats and other proteins Meat or fish that is salted, canned, smoked, spiced, or pickled. Precooked or cured meat, such as sausages or meat loaves. Tomasa Blase. Ham. Pepperoni. Hot dogs. Corned beef. Chipped beef. Salt pork. Jerky. Pickled herring. Anchovies and sardines. Regular canned tuna. Salted nuts. Dairy Processed cheese and cheese spreads. Hard cheeses. Cheese curds. Blue cheese. Feta cheese. String cheese. Regular cottage cheese. Buttermilk. Canned milk. Fats and oils Salted butter. Regular margarine. Ghee. Bacon fat. Seasonings and condiments Onion salt, garlic salt, seasoned salt, table salt, and sea salt. Canned and packaged gravies. Worcestershire sauce. Tartar sauce. Barbecue sauce. Teriyaki sauce. Soy sauce, including reduced-sodium. Steak sauce. Fish sauce. Oyster sauce. Cocktail sauce. Horseradish that you find on the shelf. Regular ketchup and mustard. Meat flavorings and tenderizers. Bouillon cubes. Hot sauce. Pre-made or packaged marinades. Pre-made or packaged taco seasonings. Relishes. Regular salad dressings. Salsa. Other foods Salted popcorn and pretzels. Corn chips and puffs. Potato and tortilla chips. Canned or dried soups. Pizza. Frozen entrees and pot pies. The items listed above may not be a complete list of foods and beverages you should avoid. Contact a dietitian for more information. Summary  Eating less sodium can help lower your blood pressure, reduce swelling, and protect your heart, liver, and kidneys.  Most people  on this plan should limit their sodium intake to 1,500-2,000 mg (milligrams) of sodium each day.  Canned, boxed, and frozen foods are high in sodium. Restaurant foods, fast foods, and pizza are also very high in sodium. You also get sodium by adding salt to food.  Try to cook at home, eat more fresh  fruits and vegetables, and eat less fast food and canned, processed, or prepared foods. This information is not intended to replace advice given to you by your health care provider. Make sure you discuss any questions you have with your health care provider. Document Revised: 06/21/2019 Document Reviewed: 04/17/2019 Elsevier Patient Education  2021 [employer].

## 2020-11-11 ENCOUNTER — Encounter: Payer: Medicare Other | Admitting: *Deleted

## 2020-11-11 ENCOUNTER — Other Ambulatory Visit: Payer: Self-pay

## 2020-11-11 VITALS — BP 163/73 | HR 72 | Wt 209.8 lb

## 2020-11-11 DIAGNOSIS — Z006 Encounter for examination for normal comparison and control in clinical research program: Secondary | ICD-10-CM

## 2020-11-11 NOTE — Research (Signed)
  We saw patient for end of study Clear visit. Physical and EKG were done by Dr. Riley Kill. Study medication showed 99% compliance. Adverse events and concomitant medications were assessed and reconciled.  I will call patient in 30 days for end of study. Patient returned all study medications.

## 2020-11-17 NOTE — Addendum Note (Signed)
Addended by: Loletha Carrow D on: 11/17/2020 09:12 AM   Modules accepted: Orders

## 2020-12-04 ENCOUNTER — Telehealth: Payer: Self-pay | Admitting: *Deleted

## 2020-12-07 NOTE — Telephone Encounter (Signed)
Just completed the post end of study phone call for the CLEAR research study. Subject is doing good, there is no new AE's or SAE's to report to sponsor at this time. All concomitant medications have been reviewed and updated if applicable in the [facility] system.

## 2021-01-25 NOTE — Progress Notes (Addendum)
Cardiology Office Note:    Date:  01/26/2021   ID:  Derrick [name], DOB June 24, 1955, MRN [id number]  PCP:  Kristen Loader, [name]   [facility] Providers Cardiologist:  Mertie Moores, MD Cardiology APP:  Sharmon Revere      Referring MD: Kristen Loader, [name]   Chief Complaint:  F/u on CAD, HTN.  Surgical clearance.    Patient Profile:    Derrick [name] is a 65 y.o. male with:  Coronary artery disease  Hx PCI to LAD and LCx S/p CABG in 2018 Post op atrial fibrillation  PAD S/p L Fem-Post Tib bypass 2016 S/p R Fem-Pop 2016 S/p bilat CIA stents in 2016 Carotid artery Dz [place] 12/21: bilat ICA 1-39 Hyperlipidemia  Hypertension  Chronic kidney disease  Sinus brady     Prior CV studies: VAS US CAROTID DUPLEX BILATERAL 04/30/2020 Summary: Right Carotid: Velocities in the right ICA are consistent with a 1-39% stenosis. Left Carotid: Velocities in the left ICA are consistent with a 1-39% stenosis. Vertebrals:  Bilateral vertebral arteries demonstrate antegrade flow. Subclavians: Right subclavian artery was stenotic. Normal flow hemodynamics were seen in the left subclavian artery.   Cardiac catheterization 03/01/17 1. Severe triple vessel CAD 2. Severe ostial left main stenosis with dampening of pressures with catheter engagement.  3. Patent stent mid LAD 4. Patent stent mid Circumflex with moderate disease in the proximal Circumflex prior to the stent.  5. Chronic occlusion RCA with filling of distal branches by left to right collaterals.  6. Normal LV systolic function   Recommendations: He has high grade stenosis of the ostial left main artery and filling of the RCA from left to right collaterals. CABG is the best method for revascularization given his anatomy. Will consult CT surgery.    History of Present Illness: Mr. Derrick [name] was last seen in 5/22.  I switched his Diltiazem to Amlodipine due to uncontrolled BP.  He returns for f/u.  He is here alone.  Since  last seen, he has been doing fairly well.  Over the last several months, he has noted worsening shortness of breath with exertion.  He had symptoms like this prior to his bypass surgery.  He has not had chest discomfort.  He has not had syncope.  He does get lightheaded sometimes when he bends over and stands up.  He has not had orthopnea.  He has mild ankle edema bilaterally.  Left is always greater than the right.  He will need back surgery with Dr. Rolena Infante in October for a bulging disc.        Past Medical History:  Diagnosis Date   Coronary artery disease 2006   stents x2=LAD LCX; totally occluded RCA b. CABG 03/02/17 x2 (left internal mammary artery to LAD, Y-graft right mammary from left mammary to OM).   Dyslipidemia    GERD (gastroesophageal reflux disease)    History of bronchitis    Hyperlipidemia    statin intolerant   Hyperparathyroidism    Hypertension    Insomnia    Metatarsalgia    Neuropathy    OA (osteoarthritis)    Peripheral vascular disease (HCC)    Seasonal allergies    Urinary frequency    Wears glasses     Current Medications: Current Meds  Medication Sig   amLODipine (NORVASC) 5 MG tablet Take 1 tablet (5 mg total) by mouth daily.   aspirin EC 81 MG tablet Take 1 tablet (81 mg total) by mouth daily.  calcitRIOL (ROCALTROL) 0.5 MCG capsule Take 0.5 mcg by mouth daily.   calcium carbonate (OSCAL) 1500 (600 Ca) MG TABS tablet Take 600 mg of elemental calcium 2 (two) times daily with a meal by mouth. Patient takes 2 in the morning and 1 in the evening.   Cyanocobalamin (VITAMIN B-12) 2500 MCG SUBL Place 2,500 mcg under the tongue daily.   ezetimibe (ZETIA) 10 MG tablet Take 10 mg by mouth daily.   famotidine (PEPCID) 20 MG tablet Take 20 mg by mouth 2 (two) times daily.   fenofibrate 160 MG tablet Take 160 mg by mouth daily.   gabapentin (NEURONTIN) 300 MG capsule Take 300 mg by mouth at bedtime.   hydrochlorothiazide (MICROZIDE) 12.5 MG capsule Take 1 capsule  (12.5 mg total) by mouth daily.   ipratropium (ATROVENT) 0.03 % nasal spray Place 1 spray into both nostrils as needed.   losartan (COZAAR) 100 MG tablet Take 100 mg by mouth every evening.    MAGNESIUM PO Take 250 mg by mouth daily.   neomycin-polymyxin-hydrocortisone (CORTISPORIN) 3.5-10000-1 OTIC suspension Place 3 drops into the left ear as needed.   nitroGLYCERIN (NITROSTAT) 0.4 MG SL tablet Place 1 tablet (0.4 mg total) under the tongue every 5 (five) minutes as needed for chest pain.   Omega-3 Fatty Acids (OMEGA-3 EPA FISH OIL PO) Take 2 tablets by mouth 2 (two) times daily.    potassium chloride (KLOR-CON) 10 MEQ tablet Take 1 tablet (10 mEq total) by mouth daily.   pravastatin (PRAVACHOL) 20 MG tablet Take 1 tablet (20 mg total) by mouth every evening.   tamsulosin (FLOMAX) 0.4 MG CAPS capsule Take 0.4 mg by mouth daily.   traZODone (DESYREL) 50 MG tablet Take 50 mg by mouth at bedtime.   [DISCONTINUED] metoprolol tartrate (LOPRESSOR) 25 MG tablet TAKE 1 TABLET (25 MG TOTAL) BY MOUTH 2 (TWO) TIMES DAILY.     Allergies:   Codeine, Atorvastatin, Crestor [rosuvastatin calcium], Meloxicam, and Statins   Social History   Tobacco Use   Smoking status: Former    Types: Cigarettes    Quit date: 05/30/1989    Years since quitting: 31.6   Smokeless tobacco: Never  Vaping Use   Vaping Use: Never used  Substance Use Topics   Alcohol use: Yes    Comment: 6 beers per month   Drug use: Never     Family Hx: The patient's family history includes Arthritis in his mother; COPD in his brother and father; Cancer in his brother and brother; Lung cancer in his father; Varicose Veins in his mother.  Review of Systems  Gastrointestinal:  Negative for hematochezia and melena.  Genitourinary:  Negative for hematuria.    EKGs/Labs/Other Test Reviewed:    EKG:  EKG is ordered today.  The ekg ordered today demonstrates sinus bradycardia, HR 53, normal axis, no ST-T wave changes, QTC 392  Recent  Labs: 04/29/2020: ALT 22; Hemoglobin 13.4; Platelets 319; TSH 1.970 09/15/2020: BUN 34; Creatinine, Ser 1.71; Potassium 4.2; Sodium 138   Recent Lipid Panel Lab Results  Component Value Date/Time   CHOL 184 09/15/2020 07:39 AM   TRIG 385 (H) 09/15/2020 07:39 AM   HDL 27 (L) 09/15/2020 07:39 AM   LDLCALC 93 09/15/2020 07:39 AM      Risk Assessment/Calculations:          Physical Exam:    VS:  BP (!) 130/58   Pulse 60   Ht 6' 2" (1.88 m)   Wt 213 lb 3.2 oz (96.7  kg)   SpO2 98%   BMI 27.37 kg/m     Wt Readings from Last 3 Encounters:  01/26/21 213 lb 3.2 oz (96.7 kg)  11/11/20 209 lb 12.8 oz (95.2 kg)  10/23/20 212 lb 9.6 oz (96.4 kg)     Constitutional:      Appearance: Healthy appearance. Not in distress.  Neck:     Vascular: JVD normal.  Pulmonary:     Effort: Pulmonary effort is normal.     Breath sounds: No wheezing. No rales.  Cardiovascular:     Normal rate. Regular rhythm. Normal S1. Normal S2.      Murmurs: There is no murmur.  Edema:    Peripheral edema (L>R) present.    Ankle: bilateral trace edema of the ankle. Abdominal:     Palpations: Abdomen is soft.  Skin:    General: Skin is warm and dry.  Neurological:     General: No focal deficit present.     Mental Status: Alert and oriented to person, place and time.     Cranial Nerves: Cranial nerves are intact.         ASSESSMENT & PLAN:    1. Coronary artery disease involving coronary bypass graft of native heart with unstable angina pectoris ([place]) 2. SOB (shortness of breath) 3. Preoperative cardiovascular examination History of prior stenting to the LAD and LCx.  He underwent CABG in 2018.  He has not had chest discomfort.  However, he has noted shortness of breath with certain types of activities.  He also had this prior to his bypass surgery.  He notes that he will need back surgery in October.  Therefore, I have recommended he proceed with stress testing as well as an echocardiogram.  As long  as his stress test is low risk and echocardiogram does not demonstrate significant findings, he should be able to proceed at acceptable risk.  -Lexiscan Myoview  -2D echocardiogram  4. Stage 3b chronic kidney disease ([place]) Followed by Dr. Justin Mend with nephrology.  I will obtain a CMET with labs at his stress test.  5. Essential hypertension He brings in a list of his blood pressures from home.  His blood pressures been fairly optimal since medication changes.  He does get some lightheadedness with bending over.  His heart rate is in the 50s.  I have asked him to decrease his metoprolol to tartrate to 12.5 mg twice daily to see if this helps.  Continue amlodipine 5 mg daily, hydrochlorothiazide 12.5 mg daily, losartan 100 mg daily.  6. Mixed hyperlipidemia He has had difficulty with elevated LDL and triglycerides.  He just finished participation in the CLEAR Trial (Bempedoic Acid).  He has improved his diet since his last fasting lipid panel was obtained.  I did review his case today with our clinical pharmacist.  We are still waiting on issues to be worked out with the hospital in order to get patient set up for [place].  However, she feels that he would likely qualify for the ORION-4 Trial.  We will try to get him enrolled in this.  When he returns for his stress test, I will obtain a fasting CMET, lipid panel.     Shared Decision Making/Informed Consent The risks [chest pain, shortness of breath, cardiac arrhythmias, dizziness, blood pressure fluctuations, myocardial infarction, stroke/transient ischemic attack, nausea, vomiting, allergic reaction, radiation exposure, metallic taste sensation and life-threatening complications (estimated to be 1 in 10,000)], benefits (risk stratification, diagnosing coronary artery disease, treatment guidance) and alternatives  of a nuclear stress test were discussed in detail with Mr. Aburto and he agrees to proceed.    Dispo:  No follow-ups on file.    Medication Adjustments/Labs and Tests Ordered: Current medicines are reviewed at length with the patient today.  Concerns regarding medicines are outlined above.  Tests Ordered: Orders Placed This Encounter  Procedures   Comp Met (CMET)   Lipid Profile   Cardiac Stress Test: Informed Consent Details: Physician/Practitioner Attestation; Transcribe to consent form and obtain patient signature   Myocardial Perfusion Imaging   EKG 12-Lead   ECHOCARDIOGRAM COMPLETE   Medication Changes: Meds ordered this encounter  Medications   metoprolol tartrate (LOPRESSOR) 25 MG tablet    Sig: Take 0.5 tablets (12.5 mg total) by mouth 2 (two) times daily.    Dispense:  90 tablet    Refill:  3    Signed, Richardson Dopp, PA-C  01/26/2021 9:35 AM    [facility] [place], [place], [place]  [address] Phone: [phone]; Fax: [phone]   ADDENDUM: GATED SPECT MYO PERF W/LEXISCAN STRESS 1D 01/28/2021 Narrative   Findings are consistent with prior myocardial infarction. The study is low risk.   No ST deviation was noted.   LV perfusion is abnormal. There is no evidence of ischemia. There is evidence of infarction. Defect 1: There is a medium defect with mild reduction in uptake present in the apical to basal inferior location(s) that is fixed. There is normal wall motion in the defect area. Consistent with infarction.   Left ventricular function is normal. Nuclear stress EF: 61 %. The left ventricular ejection fraction is normal (55-65%). End diastolic cavity size is normal.   Prior study available for comparison from 03/06/2015. No changes compared to prior study.   Echocardiogram 02/12/21:  EF 55-60, no RWMA, GLS -24.5%, normal RVSF, RVSP 30.7, mild LAE, trivial MR, trivial AI, mild AV sclerosis w/o AS  RECOMMENDATIONS: The pt may proceed with his surgery at acceptable risk.  Ideally, ASA should be continued without interruption.  If the bleeding risk is too great,  ASA can be held for up to 7 days prior to surgery and resumed post op when felt to be safe.     Richardson Dopp, PA-C    02/14/2021 10:10 PM

## 2021-01-26 ENCOUNTER — Encounter: Payer: Self-pay | Admitting: Physician Assistant

## 2021-01-26 ENCOUNTER — Other Ambulatory Visit: Payer: Self-pay

## 2021-01-26 ENCOUNTER — Ambulatory Visit (INDEPENDENT_AMBULATORY_CARE_PROVIDER_SITE_OTHER): Payer: Medicare Other | Admitting: Physician Assistant

## 2021-01-26 ENCOUNTER — Telehealth: Payer: Self-pay | Admitting: Pharmacist

## 2021-01-26 ENCOUNTER — Telehealth (HOSPITAL_COMMUNITY): Payer: Self-pay

## 2021-01-26 VITALS — BP 130/58 | HR 60 | Ht 74.0 in | Wt 213.2 lb

## 2021-01-26 DIAGNOSIS — I10 Essential (primary) hypertension: Secondary | ICD-10-CM

## 2021-01-26 DIAGNOSIS — Z01810 Encounter for preprocedural cardiovascular examination: Secondary | ICD-10-CM | POA: Diagnosis not present

## 2021-01-26 DIAGNOSIS — E782 Mixed hyperlipidemia: Secondary | ICD-10-CM

## 2021-01-26 DIAGNOSIS — R0602 Shortness of breath: Secondary | ICD-10-CM

## 2021-01-26 DIAGNOSIS — N1832 Chronic kidney disease, stage 3b: Secondary | ICD-10-CM

## 2021-01-26 DIAGNOSIS — I25700 Atherosclerosis of coronary artery bypass graft(s), unspecified, with unstable angina pectoris: Secondary | ICD-10-CM | POA: Diagnosis not present

## 2021-01-26 MED ORDER — METOPROLOL TARTRATE 25 MG PO TABS
12.5000 mg | ORAL_TABLET | Freq: Two times a day (BID) | ORAL | 3 refills | Status: DC
Start: 2021-01-26 — End: 2021-04-02

## 2021-01-26 NOTE — Telephone Encounter (Signed)
Spoke with the patient, detailed instructions given. He stated that he would be here for his test. Asked to call back with any questions. Derrick [name] EMTP 

## 2021-01-26 NOTE — Patient Instructions (Signed)
Medication Instructions:   DECREASE Metoprolol one half tablet by mouth ( 12.5 mg) twice daily.  *If you need a refill on your cardiac medications before your next appointment, please call your pharmacy*   Lab Work: Your physician recommends that you return for a FASTING lipid profile/CMET/ Thursday, September 1st.    If you have labs (blood work) drawn today and your tests are completely normal, you will receive your results only by: MyChart Message (if you have MyChart) OR A paper copy in the mail If you have any lab test that is abnormal or we need to change your treatment, we will call you to review the results.   Testing/Procedures: You are scheduled for a Myocardial Perfusion Imaging Study on Thursday, September at 7:15 am.   Please arrive 15 minutes prior to your appointment time for registration and insurance purposes.   The test will take approximately 3 to 4 hours to complete; you may bring reading material. If someone comes with you to your appointment, they will need to remain in the main lobby due to limited space in the testing area.   How to prepare for your Myocardial Perfusion test:   Do not eat or drink 3 hours prior to your test, except you may have water.    Do not consume products containing caffeine (regular or decaffeinated) 12 hours prior to your test (ex: coffee, chocolate, soda, tea)   Do bring a list of your current medications with you. If not listed below, you may take your medications as normal.   Bring any held medication to your appointment, as you may be required to take it once the test is complete.   Do wear comfortable clothes ( overalls) and walking shoes. Tennis shoes are preferred. No open toed shoes.  Do not wear cologne, aftershave or lotions (deodorant is allowed).   If these instructions are not followed, you test will have to be rescheduled.   Please report to [address] for your test. If you have questions or  concerns about your appointment, please call the Nuclear Lab at #[phone].  If you cannot keep your appointment, please provide 24 hour notification to the Nuclear lab to avoid a possible $50 charge to your account.    Your physician has requested that you have an echocardiogram. Echocardiography is a painless test that uses sound waves to create images of your heart. It provides your doctor with information about the size and shape of your heart and how well your heart's chambers and valves are working. This procedure takes approximately one hour. There are no restrictions for this procedure.    Follow-Up: At [facility], you and your health needs are our priority.  As part of our continuing mission to provide you with exceptional heart care, we have created designated Provider Care Teams.  These Care Teams include your primary Cardiologist (physician) and Advanced Practice Providers (APPs -  Physician Assistants and Nurse Practitioners) who all work together to provide you with the care you need, when you need it.  We recommend signing up for the patient portal called "MyChart".  Sign up information is provided on this After Visit Summary.  MyChart is used to connect with patients for Virtual Visits (Telemedicine).  Patients are able to view lab/test results, encounter notes, upcoming appointments, etc.  Non-urgent messages can be sent to your provider as well.   To learn more about what you can do with MyChart, go to [email or web address].  Your next appointment:   6 month(s)  The format for your next appointment:   In Person  Provider:   Kristeen Miss, MD   Other Instructions

## 2021-01-26 NOTE — Telephone Encounter (Signed)
Pt is interested in ORION-4 trial. He should qualify for study based on hx of PAD s/p multiple revascularization procedures and most recent LDL of 93. Will forward to research to reach out to pt for screening.

## 2021-01-28 ENCOUNTER — Ambulatory Visit (HOSPITAL_COMMUNITY): Payer: Medicare Other | Attending: Internal Medicine

## 2021-01-28 ENCOUNTER — Other Ambulatory Visit: Payer: Medicare Other

## 2021-01-28 ENCOUNTER — Other Ambulatory Visit: Payer: Self-pay

## 2021-01-28 DIAGNOSIS — R0602 Shortness of breath: Secondary | ICD-10-CM | POA: Insufficient documentation

## 2021-01-28 DIAGNOSIS — I25700 Atherosclerosis of coronary artery bypass graft(s), unspecified, with unstable angina pectoris: Secondary | ICD-10-CM | POA: Diagnosis present

## 2021-01-28 DIAGNOSIS — I10 Essential (primary) hypertension: Secondary | ICD-10-CM

## 2021-01-28 DIAGNOSIS — Z01810 Encounter for preprocedural cardiovascular examination: Secondary | ICD-10-CM

## 2021-01-28 DIAGNOSIS — E782 Mixed hyperlipidemia: Secondary | ICD-10-CM | POA: Insufficient documentation

## 2021-01-28 DIAGNOSIS — N1832 Chronic kidney disease, stage 3b: Secondary | ICD-10-CM

## 2021-01-28 LAB — COMPREHENSIVE METABOLIC PANEL
ALT: 15 IU/L (ref 0–44)
AST: 21 IU/L (ref 0–40)
Albumin/Globulin Ratio: 2 (ref 1.2–2.2)
Albumin: 4.9 g/dL — ABNORMAL HIGH (ref 3.8–4.8)
Alkaline Phosphatase: 32 IU/L — ABNORMAL LOW (ref 44–121)
BUN/Creatinine Ratio: 16 (ref 10–24)
BUN: 27 mg/dL (ref 8–27)
Bilirubin Total: 0.3 mg/dL (ref 0.0–1.2)
CO2: 24 mmol/L (ref 20–29)
Calcium: 10.2 mg/dL (ref 8.6–10.2)
Chloride: 99 mmol/L (ref 96–106)
Creatinine, Ser: 1.69 mg/dL — ABNORMAL HIGH (ref 0.76–1.27)
Globulin, Total: 2.4 g/dL (ref 1.5–4.5)
Glucose: 145 mg/dL — ABNORMAL HIGH (ref 65–99)
Potassium: 4.4 mmol/L (ref 3.5–5.2)
Sodium: 138 mmol/L (ref 134–144)
Total Protein: 7.3 g/dL (ref 6.0–8.5)
eGFR: 44 mL/min/{1.73_m2} — ABNORMAL LOW (ref >59)

## 2021-01-28 LAB — MYOCARDIAL PERFUSION IMAGING
Base ST Depression (mm): 0 mm
LV dias vol: 123 mL (ref 62–150)
LV sys vol: 49 mL
Nuc Stress EF: 61 %
Peak HR: 99 {beats}/min
Rest HR: 55 {beats}/min
Rest Nuclear Isotope Dose: 10.5 mCi
SDS: 4
SRS: 0
SSS: 4
ST Depression (mm): 0 mm
Stress Nuclear Isotope Dose: 31.3 mCi
TID: 1.03

## 2021-01-28 LAB — LIPID PANEL
Chol/HDL Ratio: 5.1 ratio — ABNORMAL HIGH (ref 0.0–5.0)
Cholesterol, Total: 199 mg/dL (ref 100–199)
HDL: 39 mg/dL — ABNORMAL LOW (ref >39)
LDL Chol Calc (NIH): 121 mg/dL — ABNORMAL HIGH (ref 0–99)
Triglycerides: 224 mg/dL — ABNORMAL HIGH (ref 0–149)
VLDL Cholesterol Cal: 39 mg/dL (ref 5–40)

## 2021-01-28 MED ORDER — TECHNETIUM TC 99M TETROFOSMIN IV KIT
10.5000 | PACK | Freq: Once | INTRAVENOUS | Status: AC | PRN
  Administered 2021-01-28: 10.5 via INTRAVENOUS
  Filled 2021-01-28: qty 11

## 2021-01-28 MED ORDER — TECHNETIUM TC 99M TETROFOSMIN IV KIT
31.3000 | PACK | Freq: Once | INTRAVENOUS | Status: AC | PRN
Start: 2021-01-28 — End: 2021-01-28
  Administered 2021-01-28: 31.3 via INTRAVENOUS
  Filled 2021-01-28: qty 32

## 2021-01-28 MED ORDER — REGADENOSON 0.4 MG/5ML IV SOLN
0.4000 mg | Freq: Once | INTRAVENOUS | Status: AC
  Administered 2021-01-28: 0.4 mg via INTRAVENOUS

## 2021-01-29 ENCOUNTER — Encounter: Payer: Self-pay | Admitting: Physician Assistant

## 2021-02-03 ENCOUNTER — Telehealth: Payer: Self-pay | Admitting: Cardiovascular Disease

## 2021-02-03 NOTE — Telephone Encounter (Signed)
   [facility] Pre-operative Risk Assessment    Patient Name: Derrick [name]  DOB: 08-11-1955 MRN: [id number]  HEARTCARE STAFF:  - IMPORTANT!!!!!! Under Visit Info/Reason for Call, type in Other and utilize the format Clearance MM/DD/YY or Clearance TBD. Do not use dashes or single digits. - Please review there is not already an duplicate clearance open for this procedure. - If request is for dental extraction, please clarify the # of teeth to be extracted. - If the patient is currently at the dentist's office, call Pre-Op Callback Staff (MA/nurse) to input urgent request.  - If the patient is not currently in the dentist office, please route to the Pre-Op pool.  Request for surgical clearance: SEE NOTES THAT PT STATES HE WOULD LIKE A COPY OF THE CLEARANCE AS WELL.  What type of surgery is being performed? ALIF L4-5, PSFI L4-5  When is this surgery scheduled? TBD PER CLEARANCE REQUEST   What type of clearance is required (medical clearance vs. Pharmacy clearance to hold med vs. Both)? MEDICAL  Are there any medications that need to be held prior to surgery and how long?  ASA   Practice name and name of physician performing surgery? [facility]; DR. DAHARI BROOKS  What is the office phone number? [phone]   7.   What is the office fax number? [place]   8.   Anesthesia type (None, local, MAC, general) ? NOT LISTED (CHOICE)   Julaine Hua 02/03/2021, 1:19 PM  _________________________________________________________________   (provider comments below)

## 2021-02-03 NOTE — Telephone Encounter (Signed)
Patient called and said he dropped off some paperwork for Dr. Elease Hashimoto to fill out. He is going to have surgery sometime in October .  He is requesting a copy of the paperwork for himself once it has been filled out.   Please let the patient know when the paperwork can be picked up

## 2021-02-03 NOTE — Telephone Encounter (Signed)
Pt is calling to let Dr. Elease Hashimoto and our pre-op team know that he dropped off a cardiac clearance form at the front desk this morning, for our office to advise on.  Pt is aware of our process and policy of how we advise on these forms.  Front desk states that they do have the forms up front, and med rec will come by and pick this up, and give to our pre-op clearance team to further input clearance and advise thereafter. Pt is asking once the pre-op team is complete with his clearance, would they also kindly send him a copy of this, for his keeping.  He states they can mail this or he can come by the office to pick this up.  Pts surgery is not until Oct.  Will forward this message to our Pre-op CMA as an FYI, to make her aware that once form complete, pt would like a copy thereafter. Pt was gracious for all the assistance provided.

## 2021-02-08 ENCOUNTER — Other Ambulatory Visit: Payer: Self-pay | Admitting: *Deleted

## 2021-02-08 ENCOUNTER — Other Ambulatory Visit: Payer: Self-pay

## 2021-02-08 MED ORDER — AMLODIPINE BESYLATE 5 MG PO TABS: 5.0000 mg | ORAL_TABLET | Freq: Every day | ORAL | 1 refills | Status: DC

## 2021-02-08 MED ORDER — PRAVASTATIN SODIUM 20 MG PO TABS
20.0000 mg | ORAL_TABLET | Freq: Every evening | ORAL | 3 refills | Status: DC
Start: 2021-02-08 — End: 2021-03-05

## 2021-02-10 ENCOUNTER — Other Ambulatory Visit: Payer: Self-pay

## 2021-02-10 ENCOUNTER — Telehealth: Payer: Self-pay | Admitting: Pharmacist

## 2021-02-10 DIAGNOSIS — Z006 Encounter for examination for normal comparison and control in clinical research program: Secondary | ICD-10-CM

## 2021-02-10 NOTE — Telephone Encounter (Signed)
Called pt and left message.  He was screened for ORION-4 trial yesterday and qualifies for study. Initially tried to start pt on Leqvio but this was all put on hold about 6 months ago due to billing and reimbursement issues. Was advised a few days ago that we are now able to start scheduling pts again.  Benefit investigation for Leqvio coverage was completed earlier this year - pt has Medicare Part B which will cover 80% of the copay, and a Medicare supplement that will cover the remaining 20% copay. He does have an annual [phone] deductible to meet, but he has already paid this for the 2022 year.  Will await pt's call back to see if he would like to start on Leqvio injections or enroll in [place].

## 2021-02-10 NOTE — Research (Signed)
ORION 4 Informed Consent   Subject Name: Derrick [name]  Subject met inclusion and exclusion criteria.  The informed consent form, study requirements and expectations were reviewed with the subject and questions and concerns were addressed prior to the signing of the consent form.  The subject verbalized understanding of the trial requirements.  The subject agreed to participate in the ORION 4 trial and signed the informed consent at 0900 on 02/10/2021.  The informed consent was obtained prior to performance of any protocol-specific procedures for the subject.  A copy of the signed informed consent was given to the subject and a copy was placed in the subject's medical record.   Derrick [name]   Protocol version 7.0 Consent version 3    ORION 4 SUBJECT ID#           DEMOGRAPHICS SEX [x]    MALE           []   MALE  EHTINCITY []     HISPANIC OR LATINO [x]     NON-HISPANIC OR LATINO  RACE [x]     WHITE         []    ASIAN []     BLACK OR AFRICAN           AMERICAN []     AMERICAN INDIAN OR         [place] NATIVE []     NATIVE [place] OR           OTHER PACIFIC ISLANDER []     OTHER          OTHER   AGE 65  FUTURE RESEARCH []      NO            [x]     YES  Pharmacogenetics Research    []      NO            [x]     YES  BIOMARKER []      NO            [x]     YES    ORION 4 SUBJECT ID# [id number]   INCLUSION/EXCLUSION                                           INCLUSIONS                                YES      NO  SIGNED INFORMED CONSENT [x]  []   MEN ? 40          WOMEN ? 55            AGE:65 [x]  []   PRIOR MI, ISCHEMIC STROKE OR PERIPHERAL ARTERY DISEASE AS EVIDENT BY PRIOR LOWER EXTREMITY ARTERY REVASCULARIZATION OR AORTIC ANEURYSM REPAIR [x]  []   TOTAL CHOLESTEROL ? 176m/dL       RESULT: 211 [x]  []                                          EXCLUSIONS                 N/A [x]   ACUTE CORONARY SYNDROME OR STROKE LESS THAN 4 WKS PRIOR []  []   CORONARY REVASCULARIZATION PROCEDURE PLANNED WITHIN  THE NEXT 6 MONTHS []  []   CHRONIC LIVER DISEASE []  []   CURRENT OR PLANNED RENAL DIALYSIS OR TRANSPLANT []  []   PREVIOUS EXPOSURE TO INCLISIRAN OR PARTICIPATION IN A RANDOMINIZED TRIAL OF INCLISIRAN []  []   PREVIOUS (WITHIN ABOUT 3 MONTHS), CURRENT OR PLANNED TREATMENT WITH MONOCLONAL ANTIBODY TARGETING PCSK9 []  []   KNOWN TO BE POORLY COMPLIANT WITH CLINIC VISITS OR PRESCRIBED MEDICATIONS []  []   MEDICAL HISTORY THAT MIGHT LIMIT ABILITY TO TAKE TRIAL TREATMENTST FOR THE DURATION OF THE STUDY, (EX. SEVERE RESPIRATORY DISEASE, CANCER OR EVIDENCE OF SPREAD WITHIN THE LAST 5 YEARS, HX OF ALCOHOL OR SUBSTANCE ABUSE) []  []   WOMEN OF CHILD-BEARING POTENTIAL, CURRENT PREGNANCY OR LACATION []  []   PARTICIPATION IN A CLINICAL TRIAL WITH AN UNLICENSED DRUG OR DEVICE []  []

## 2021-02-11 ENCOUNTER — Other Ambulatory Visit: Payer: Self-pay | Admitting: Pharmacist

## 2021-02-11 DIAGNOSIS — E782 Mixed hyperlipidemia: Secondary | ICD-10-CM

## 2021-02-11 DIAGNOSIS — I25700 Atherosclerosis of coronary artery bypass graft(s), unspecified, with unstable angina pectoris: Secondary | ICD-10-CM

## 2021-02-11 NOTE — Telephone Encounter (Signed)
Pt returned call to clinic. He is interested in starting Leqvio. Called infusion center and scheduled pt on Sept 28th at 8:45am. He is aware to go to the [facility] and let the registration desk know he's scheduled for an injection in the infusion center.  Will forward to research team as an update as he was screened yesterday for the ORION-4 trial.

## 2021-02-12 ENCOUNTER — Ambulatory Visit (HOSPITAL_COMMUNITY): Payer: Medicare Other | Attending: Cardiology

## 2021-02-12 ENCOUNTER — Other Ambulatory Visit: Payer: Self-pay

## 2021-02-12 DIAGNOSIS — E782 Mixed hyperlipidemia: Secondary | ICD-10-CM

## 2021-02-12 DIAGNOSIS — I10 Essential (primary) hypertension: Secondary | ICD-10-CM | POA: Diagnosis not present

## 2021-02-12 DIAGNOSIS — N1832 Chronic kidney disease, stage 3b: Secondary | ICD-10-CM | POA: Insufficient documentation

## 2021-02-12 DIAGNOSIS — I25700 Atherosclerosis of coronary artery bypass graft(s), unspecified, with unstable angina pectoris: Secondary | ICD-10-CM | POA: Insufficient documentation

## 2021-02-12 DIAGNOSIS — Z01810 Encounter for preprocedural cardiovascular examination: Secondary | ICD-10-CM | POA: Diagnosis not present

## 2021-02-12 DIAGNOSIS — R0602 Shortness of breath: Secondary | ICD-10-CM

## 2021-02-12 LAB — ECHOCARDIOGRAM COMPLETE
Area-P 1/2: 3.12 cm2
S' Lateral: 3.6 cm

## 2021-02-14 ENCOUNTER — Encounter: Payer: Self-pay | Admitting: Physician Assistant

## 2021-02-16 NOTE — Telephone Encounter (Signed)
I will fax clearance notes and recommendations to requesting office. I will also mail a copy of the clearance to the pt as he has stated he would like a copy of the clearance note as well. I s/w the pt and he is ok with clearance being mailed to him. I did confirm mailing address with the pt while on the phone. Pt thanked me for the call and the help. I assured the pt that I did fax clearance notes to Dr. Shon Baton.

## 2021-02-16 NOTE — Telephone Encounter (Signed)
   Patient Name: Derrick [name]  DOB: [birth year] MRN: [id number]  Primary Cardiologist: Derrick Miss, MD  Chart reviewed as part of pre-operative protocol coverage. Patient was recently seen by Derrick Newcomer PA-C for pre-op clearance. He underwent nuclear stress testing and echocardiogram. On nuclear stress test, Derrick [name] commented, "Stress test is okay.  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 - Continue current medications/treatment plan and follow up as scheduled. - Pt may proceed with surgery at acceptable risk. - If needed ASA can be held for up to 7 days prior to surgery and resumed post op as soon as it is felt to be safe."  Will route this bundled recommendation to requesting provider via Epic fax function. Please call with questions.  Per original clearance request it also indicates patient wanted a copy of clearance as well so will route to callback to assist.   Derrick [place], PA-C 02/16/2021, 10:50 AM

## 2021-02-18 DIAGNOSIS — Z006 Encounter for examination for normal comparison and control in clinical research program: Secondary | ICD-10-CM

## 2021-02-18 NOTE — Research (Signed)
Patient with drawn from run-in phase of ORION 4 due to starting inclisiran an outpatient basis

## 2021-02-22 ENCOUNTER — Encounter (HOSPITAL_COMMUNITY): Payer: Medicare Other

## 2021-02-24 ENCOUNTER — Other Ambulatory Visit: Payer: Self-pay

## 2021-02-24 ENCOUNTER — Ambulatory Visit (HOSPITAL_COMMUNITY)
Admission: RE | Admit: 2021-02-24 | Discharge: 2021-02-24 | Disposition: A | Discharge status: Home / Self Care | Payer: Medicare Other | Source: Ambulatory Visit | Attending: Cardiovascular Disease | Admitting: Cardiovascular Disease

## 2021-02-24 DIAGNOSIS — I25700 Atherosclerosis of coronary artery bypass graft(s), unspecified, with unstable angina pectoris: Secondary | ICD-10-CM | POA: Insufficient documentation

## 2021-02-24 DIAGNOSIS — E782 Mixed hyperlipidemia: Secondary | ICD-10-CM

## 2021-02-24 MED ORDER — INCLISIRAN SODIUM 284 MG/1.5ML ~~LOC~~ SOSY
284.0000 mg | PREFILLED_SYRINGE | Freq: Once | SUBCUTANEOUS | Status: AC
Start: 2021-02-24 — End: 2021-02-24
  Administered 2021-02-24: 284 mg via SUBCUTANEOUS
  Filled 2021-02-24: qty 1.5

## 2021-03-05 ENCOUNTER — Other Ambulatory Visit: Payer: Self-pay

## 2021-03-05 DIAGNOSIS — N189 Chronic kidney disease, unspecified: Secondary | ICD-10-CM | POA: Insufficient documentation

## 2021-03-05 DIAGNOSIS — N1831 Chronic kidney disease, stage 3a: Secondary | ICD-10-CM | POA: Insufficient documentation

## 2021-03-05 DIAGNOSIS — E200 Idiopathic hypoparathyroidism: Secondary | ICD-10-CM | POA: Insufficient documentation

## 2021-03-08 ENCOUNTER — Ambulatory Visit: Payer: Self-pay | Admitting: Orthopedic Surgery

## 2021-03-08 DIAGNOSIS — M5136 Other intervertebral disc degeneration, lumbar region: Secondary | ICD-10-CM

## 2021-03-08 DIAGNOSIS — Z01818 Encounter for other preprocedural examination: Secondary | ICD-10-CM

## 2021-03-15 ENCOUNTER — Other Ambulatory Visit: Payer: Self-pay

## 2021-03-21 ENCOUNTER — Other Ambulatory Visit: Payer: Self-pay | Admitting: Cardiovascular Disease

## 2021-03-22 ENCOUNTER — Ambulatory Visit: Payer: Self-pay | Admitting: Orthopedic Surgery

## 2021-03-22 NOTE — H&P (Signed)
Subjective:   Patient is a 65 y.o. male presented with a history of MI (sees cardiologsit, on ASA). CC: Low back and right leg pain  Onset of symptoms was gradual starting several years ago with gradually worsening course since that time. He is being admitted for surgical management of this condition. The indications for the procedure include failure to improve despite appropriate conservative care including PT, injections, and medications.  Patient Active Problem List   Diagnosis Date Noted   Chronic kidney disease 03/05/2021   Idiopathic hypoparathyroidism (HCC) 03/05/2021   Statin myopathy 10/07/2020   Complex regional pain syndrome of lower limb 09/25/2020   Neuropathic pain 09/25/2020   Hypertension 07/17/2020   Gait abnormality 04/16/2020   Paresthesia of left lower extremity 04/16/2020   History of neck surgery 04/16/2020   Pain of left hand 09/12/2019   Bilateral impacted cerumen 01/02/2019   Degeneration of lumbar intervertebral disc 08/22/2018   Conductive hearing loss of left ear 04/02/2018   Vasomotor rhinitis 04/02/2018   Pain in right knee 11/22/2017   S/P CABG x 2 03/02/2017   Unstable angina (HCC) 03/01/2017   Drainage from wound-Left Groin,Thigh and lower leg 03/27/2015   Left leg swelling 03/27/2015   PAD (peripheral artery disease) (HCC) 03/17/2015   Closed fracture of shaft of metacarpal bone(s) 04/23/2013   Right rotator cuff tear 04/23/2013   Coronary artery disease 10/29/2010   Hyperlipidemia 10/29/2010   Past Medical History:  Diagnosis Date   Coronary artery disease 2006   stents x2=LAD LCX; totally occluded RCA b. CABG 03/02/17 x2 (left internal mammary artery to LAD, Y-graft right mammary from left mammary to OM).// Myoview 9/22: Inferior infarct, no ischemia, EF 61; low risk // Echocardiogram 9/22: EF 55-60, no RWMA, GLS -24.5%, normal RVSF, RVSP 30.7, mild LAE, trivial MR, trivial AI, mild AV sclerosis w/o AS   Dyslipidemia    GERD (gastroesophageal  reflux disease)    History of bronchitis    Hyperlipidemia    statin intolerant   Hyperparathyroidism    Hypertension    Insomnia    Metatarsalgia    Neuropathy    OA (osteoarthritis)    Peripheral vascular disease (HCC)    Seasonal allergies    Urinary frequency    Wears glasses     Past Surgical History:  Procedure Laterality Date   CARDIAC CATHETERIZATION  10/28/2004   circ and mid LAD chyper stents   CERVICAL FUSION  2007,2009   CERVICAL SPINE SURGERY  2004   COLONOSCOPY     CORONARY ANGIOPLASTY  2006   stents placed    CORONARY ARTERY BYPASS GRAFT N/A 03/02/2017   Procedure: CORONARY ARTERY BYPASS GRAFTING (CABG), ON PUMP, TIMES TWO, USING BILATERAL MAMMARY ARTERIES;  Surgeon: Loreli Slot, MD;  Location: [facility] OR;  Service: Open Heart Surgery;  Laterality: N/A;   ELBOW ARTHROPLASTY  1994   right   ENDARTERECTOMY FEMORAL Left 03/17/2015   Procedure: ENDARTERECTOMY COMMON FEMORAL WITH PROFUNDOPLASTY;  Surgeon: Sherren Kerns, MD;  Location: [facility] OR;  Service: Vascular;  Laterality: Left;   FEMORAL-POPLITEAL BYPASS GRAFT Left 03/17/2015   Procedure: BYPASS GRAFT FEMORAL-POPLITEAL ARTERY-LEFT LEG AND POSTERIOR TIBIAL SEQUENTIAL GRAFT TO BELOW KNEE POPLITEAL ARTERY;  Surgeon: Sherren Kerns, MD;  Location: [facility] OR;  Service: Vascular;  Laterality: Left;   FEMORAL-POPLITEAL BYPASS GRAFT Right 05/11/2015   Procedure:  RIGHT FEMORAL-BELOW THE KNEE POPLITEAL ARTERY BYPASS GRAFT USING NON REVERSE RIGHT GREATER SAPHENOUS VEIN;  Surgeon: Sherren Kerns, MD;  Location: [facility]  OR;  Service: Vascular;  Laterality: Right;   INCISION AND DRAINAGE ABSCESS POSTERIOR CERVICALSPINE  2009   INTRAOPERATIVE ARTERIOGRAM Left 03/17/2015   Procedure: INTRA OPERATIVE ARTERIOGRAM X2;  Surgeon: Sherren Kerns, MD;  Location: [facility] OR;  Service: Vascular;  Laterality: Left;   INTRAOPERATIVE ARTERIOGRAM Right 05/11/2015   Procedure: INTRA OPERATIVE ARTERIOGRAM;  Surgeon: Sherren Kerns, MD;  Location:  [facility] OR;  Service: Vascular;  Laterality: Right;   LEFT HEART CATH AND CORONARY ANGIOGRAPHY N/A 03/01/2017   Procedure: LEFT HEART CATH AND CORONARY ANGIOGRAPHY;  Surgeon: Kathleene Hazel, MD;  Location: [facility] INVASIVE CV LAB;  Service: Cardiovascular;  Laterality: N/A;   PATCH ANGIOPLASTY Left 03/17/2015   Procedure: VEIN PATCH ANGIOPLASTY COMMON FEMORAL ARTERY;  Surgeon: Sherren Kerns, MD;  Location: [facility] OR;  Service: Vascular;  Laterality: Left;   PERIPHERAL VASCULAR CATHETERIZATION N/A 01/16/2015   Procedure: Abdominal Aortogram;  Surgeon: Sherren Kerns, MD;  Location: [facility] INVASIVE CV LAB;  Service: Cardiovascular;  Laterality: N/A;   SHOULDER OPEN ROTATOR CUFF REPAIR Right 04/23/2013   Procedure: RIGHT TWO TENDON ROTATOR CUFF REPAIR  SUBACROMIAL DECOMPRESSION AND OPEN DISTAL CLAVICLE RESECTION;  Surgeon: Wyn Forster., MD;  Location: [facility];  Service: Orthopedics;  Laterality: Right;   stents placed   2006   TEE WITHOUT CARDIOVERSION N/A 03/02/2017   Procedure: TRANSESOPHAGEAL ECHOCARDIOGRAM (TEE);  Surgeon: Loreli Slot, MD;  Location: [facility] OR;  Service: Open Heart Surgery;  Laterality: N/A;   VEIN HARVEST Right 05/11/2015   Procedure: WITH NON REVERSE RIGHT GREATER SAPHENOUS VEIN HARVEST;  Surgeon: Sherren Kerns, MD;  Location: [facility] OR;  Service: Vascular;  Laterality: Right;    Current Outpatient Medications  Medication Sig Dispense Refill Last Dose   amLODipine (NORVASC) 2.5 MG tablet 1 tablet      ASPIRIN 81 PO aspirin      calcitRIOL (ROCALTROL) 0.5 MCG capsule Take 0.5 mcg by mouth daily.      calcium carbonate (CALCIUM 600) 1500 (600 Ca) MG TABS tablet 1 capsule      calcium carbonate (OSCAL) 1500 (600 Ca) MG TABS tablet Take 600 mg of elemental calcium 2 (two) times daily with a meal by mouth. Patient takes 2 in the morning and 1 in the evening.      Cyanocobalamin (VITAMIN B-12) 2500 MCG SUBL Place 2,500 mcg under the tongue daily.       ezetimibe (ZETIA) 10 MG tablet Take 10 mg by mouth daily.      famotidine (PEPCID) 20 MG tablet Take 20 mg by mouth 2 (two) times daily.      fenofibrate 160 MG tablet Take 160 mg by mouth daily.      gabapentin (NEURONTIN) 300 MG capsule 2 capsules at bedtime      hydrochlorothiazide (HYDRODIURIL) 25 MG tablet 1 tablet in the morning      hydrochlorothiazide (MICROZIDE) 12.5 MG capsule Take 1 capsule (12.5 mg total) by mouth daily. 90 capsule 3    inclisiran (LEQVIO) 284 MG/1.5ML SOSY injection Inject 284 mg into the skin once. Inject 1.11mL SQ every 6 months      ipratropium (ATROVENT) 0.03 % nasal spray Place 1 spray into both nostrils as needed.      losartan (COZAAR) 100 MG tablet Take 100 mg by mouth every evening.       Magnesium 250 MG TABS 1 tablet with a meal      MAGNESIUM PO Take 250 mg by mouth daily.  metoprolol tartrate (LOPRESSOR) 25 MG tablet Take 0.5 tablets (12.5 mg total) by mouth 2 (two) times daily. 90 tablet 3    neomycin-polymyxin-hydrocortisone (CORTISPORIN) 3.5-10000-1 OTIC suspension Place 3 drops into the left ear as needed.      nitroGLYCERIN (NITROSTAT) 0.4 MG SL tablet Place 1 tablet (0.4 mg total) under the tongue every 5 (five) minutes as needed for chest pain. 75 tablet 1    nitroGLYCERIN (NITROSTAT) 0.4 MG SL tablet See admin instructions.      Omega-3 Fatty Acids (OMEGA-3 EPA FISH OIL PO) Take 2 tablets by mouth 2 (two) times daily.       Potassium 99 MG TABS 1 tablet      potassium chloride (KLOR-CON) 10 MEQ tablet Take 1 tablet (10 mEq total) by mouth daily. 90 tablet 3    pravastatin (PRAVACHOL) 20 MG tablet 1 tablet      tamsulosin (FLOMAX) 0.4 MG CAPS capsule Take 0.4 mg by mouth daily.      traZODone (DESYREL) 50 MG tablet Take 50 mg by mouth at bedtime.      vitamin B-12 (CYANOCOBALAMIN) 100 MCG tablet 5 tablets total of 500      No current facility-administered medications for this visit.   Allergies  Allergen Reactions   Codeine Itching and  Other (See Comments)   Atorvastatin Other (See Comments)    Muscle aches Muscle aches   Crestor [Rosuvastatin Calcium]     Muscle aches   Meloxicam Other (See Comments)    Can not take due to kidneys   Statins Other (See Comments)    myalgia    Social History   Tobacco Use   Smoking status: Former    Types: Cigarettes    Quit date: 05/30/1989    Years since quitting: 31.8   Smokeless tobacco: Never  Substance Use Topics   Alcohol use: Yes    Comment: 6 beers per month    Family History  Problem Relation Age of Onset   Arthritis Mother    Varicose Veins Mother    Lung cancer Father    COPD Father        Lung   Cancer Brother        Lukemia   COPD Brother    Cancer Brother        Prostate    Review of Systems Pertinent items are noted in HPI.  Objective:   General: AAOx3, NAD  Ambulation: Normal, no assistive devices  Heart: RRR, no rubs, murmers, or gallops  Lungs: CTAB  Derrick [name] continues to have significant back buttock and radicular right leg pain. Negative Babinski test, no clonus, negative straight leg raise test. 5/5 motor strength in the lower extremity bilaterally. Positive numbness and dysesthesias and neuropathic pain in the right L4 and L5 dermatome.  Lumbar MRI: completed on 01/10/21 was reviewed with the patient. It was completed at [facility]; I have independently reviewed the images as well as the radiology report. Moderate to severe bilateral neuroforaminal narrowing at L4-5 with collapse of the disc space and advanced degenerative disc disease. No central canal stenosis. Moderate foraminal stenosis L5-S1 with significant degenerative disc disease and loss in disc space height. There is a mild retrolisthesis L4-5 and L5-S1. Minimal degenerative changes L1-4. Scattered simple cysts within both kidneys. No fracture or abnormal marrow signal change.   Assessment:   Derrick [name] is a very pleasant 65 year old gentleman who has had long-standing significant back  pain radiating into the right lower extremity. He has had physical  therapy and a variety of injections. He notes the best injection was the L4 and L5 selective nerve root block in December 2021. At this point time he is expressed a desire to move forward with surgery. My recommendation would be a 2 level lumbar fusion. The discectomy and interbody fixation would allow me to address the degenerative disc disease and discogenic back pain. This would also result indirect decompression to address the neuropathic leg pain. The surgical plan would be a 2 day staged fusion procedure. The first would be a 2 level ALIF, and the second day would be a posterior supplemental fixation with pedicle screws L4-S1 and if needed additional decompression. I have gone over the surgical procedure in great detail with the patient and his wife and all their questions were addressed.  Plan:   Risks and benefits of surgery were discussed with the patient. These include: Infection, bleeding, death, stroke, paralysis, ongoing or worse pain, need for additional surgery, nonunion, leak of spinal fluid, adjacent segment degeneration requiring additional fusion surgery, need for posterior decompression and/or fusion. Bleeding from major vessels, and blood clots (deep venous thrombosis)requiring additional treatment, loss in bowel and bladder control, injury to bladder, ureters, and other major abdominal organs.  Additional risk for male patients: Retrograde ejaculation, and therefore infertility. Risks and benefits of spinal fusion: Infection, bleeding, death, stroke, paralysis, ongoing or worse pain, need for additional surgery, nonunion, leak of spinal fluid, adjacent segment degeneration requiring additional fusion surgery, Injury to abdominal vessels that can require anterior surgery to stop bleeding. Malposition of the cage and/or pedicle screws that could require additional surgery. Loss of bowel and bladder control. Postoperative  hematoma causing neurologic compression that could require urgent or emergent re-operation.  Risks and benefits of decompression/discectomy: Infection, bleeding, death, stroke, paralysis, ongoing or worse pain, need for additional surgery, leak of spinal fluid, adjacent segment degeneration requiring additional surgery, post-operative hematoma formation that can result in neurological compromise and the need for urgent/emergent re-operation. Loss in bowel and bladder control. Injury to major vessels that could result in the need for urgent abdominal surgery to stop bleeding. Risk of deep venous thrombosis (DVT) and the need for additional treatment. Recurrent disc herniation resulting in the need for revision surgery, which could include fusion surgery (utilizing instrumentation such as pedicle screws and intervertebral cages).  Obtain proper medical clearance from his primary care provider and his cardiologist. He will hold his aspirin 7 days prior to surgery.  He is scheduled for LSO brace fitting  Scheduled to see vascular surgeon  We have also discussed the post-operative recovery period to include: bathing/showering restrictions, wound healing, activity (and driving) restrictions, medications/pain mangement.  We have also discussed post-operative redflags to include: signs and symptoms of postoperative infection, DVT/PE.  Patient was given a copy of the discharge instructions were reviewed with him today  Follow-up: 2 weeks postop

## 2021-03-23 ENCOUNTER — Ambulatory Visit: Payer: Self-pay | Admitting: Orthopedic Surgery

## 2021-03-23 DIAGNOSIS — Z01818 Encounter for other preprocedural examination: Secondary | ICD-10-CM

## 2021-03-23 DIAGNOSIS — Z01812 Encounter for preprocedural laboratory examination: Secondary | ICD-10-CM

## 2021-03-26 NOTE — Pre-Procedure Instructions (Addendum)
Surgical Instructions    Your procedure is scheduled on Wednesday, November 2nd.  Report to [facility] Main Entrance "A" at 6:30 A.M., then check in with the Admitting office.  Call this number if you have problems the morning of surgery:  [phone]   If you have any questions prior to your surgery date call [phone]: Open Monday-Friday 8am-4pm    Remember:  Do not eat after midnight the night before your surgery  You may drink clear liquids until 5:30 a.m. the morning of your surgery.   Clear liquids allowed are: Water, Non-Citrus Juices (without pulp), Carbonated Beverages, Clear Tea, Black Coffee Only, and Gatorade    Take these medicines the morning of surgery with A SIP OF WATER  amLODipine (NORVASC) famotidine (PEPCID) metoprolol tartrate (LOPRESSOR)  As needed: acetaminophen (TYLENOL)  Nasal spray nitroGLYCERIN (NITROSTAT)-please let a nurse know if you had to use this. Eye drops  As of today, STOP taking any Aspirin (unless otherwise instructed by your surgeon) Aleve, Naproxen, Ibuprofen, Motrin, Advil, Goody's, BC's, all herbal medications, fish oil, and all vitamins.                     Do NOT Smoke (Tobacco/Vaping) or drink Alcohol 24 hours prior to your procedure.  If you use a CPAP at night, you may bring all equipment for your overnight stay.   Contacts, glasses, piercing's, hearing aid's, dentures or partials may not be worn into surgery, please bring cases for these belongings.    For patients admitted to the hospital, discharge time will be determined by your treatment team.   Patients discharged the day of surgery will not be allowed to drive home, and someone needs to stay with them for 24 hours.  NO VISITORS WILL BE ALLOWED IN PRE-OP WHERE PATIENTS GET READY FOR SURGERY.  ONLY 1 SUPPORT PERSON MAY BE PRESENT IN THE WAITING ROOM WHILE YOU ARE IN SURGERY.  IF YOU ARE TO BE ADMITTED, ONCE YOU ARE IN YOUR ROOM YOU WILL BE ALLOWED TWO (2) VISITORS.   Minor children may have two parents present. Special consideration for safety and communication needs will be reviewed on a case by case basis.   Special instructions:   [place]- Preparing For Surgery  Before surgery, you can play an important role. Because skin is not sterile, your skin needs to be as free of germs as possible. You can reduce the number of germs on your skin by washing with CHG (chlorahexidine gluconate) Soap before surgery.  CHG is an antiseptic cleaner which kills germs and bonds with the skin to continue killing germs even after washing.    Oral Hygiene is also important to reduce your risk of infection.  Remember - BRUSH YOUR TEETH THE MORNING OF SURGERY WITH YOUR REGULAR TOOTHPASTE  Please do not use if you have an allergy to CHG or antibacterial soaps. If your skin becomes reddened/irritated stop using the CHG.  Do not shave (including legs and underarms) for at least 48 hours prior to first CHG shower. It is OK to shave your face.  Please follow these instructions carefully.   Shower the NIGHT BEFORE SURGERY and the MORNING OF SURGERY  If you chose to wash your hair, wash your hair first as usual with your normal shampoo.  After you shampoo, rinse your hair and body thoroughly to remove the shampoo.  Use CHG Soap as you would any other liquid soap. You can apply CHG directly to the skin and wash  gently with a scrungie or a clean washcloth.   Apply the CHG Soap to your body ONLY FROM THE NECK DOWN.  Do not use on open wounds or open sores. Avoid contact with your eyes, ears, mouth and genitals (private parts). Wash Face and genitals (private parts)  with your normal soap.   Wash thoroughly, paying special attention to the area where your surgery will be performed.  Thoroughly rinse your body with warm water from the neck down.  DO NOT shower/wash with your normal soap after using and rinsing off the CHG Soap.  Pat yourself dry with a CLEAN TOWEL.  Wear  CLEAN PAJAMAS to bed the night before surgery  Place CLEAN SHEETS on your bed the night before your surgery  DO NOT SLEEP WITH PETS.   Day of Surgery: Shower with CHG soap. Do not wear jewelry Do not wear lotions, powders, colognes, or deodorant. Do not shave 48 hours prior to surgery.  Men may shave face and neck. Do not bring valuables to the hospital. [facility] is not responsible for any belongings or valuables. Wear Clean/Comfortable clothing the morning of surgery Remember to brush your teeth WITH YOUR REGULAR TOOTHPASTE.   Please read over the following fact sheets that you were given.   3 days prior to your procedure or After your COVID test   You are not required to quarantine however you are required to wear a well-fitting mask when you are out and around people not in your household. If your mask becomes wet or soiled, replace with a new one.   Wash your hands often with soap and water for 20 seconds or clean your hands with an alcohol-based hand sanitizer that contains at least 60% alcohol.   Do not share personal items.   Notify your provider:  o if you are in close contact with someone who has COVID  o or if you develop a fever of 100.4 or greater, sneezing, cough, sore throat, shortness of breath or body aches.

## 2021-03-29 ENCOUNTER — Encounter (HOSPITAL_COMMUNITY)
Admission: RE | Admit: 2021-03-29 | Discharge: 2021-03-29 | Disposition: A | Discharge status: Home / Self Care | Payer: Medicare Other | Source: Ambulatory Visit | Attending: Orthopedic Surgery | Admitting: Orthopedic Surgery

## 2021-03-29 ENCOUNTER — Encounter (HOSPITAL_COMMUNITY): Payer: Self-pay

## 2021-03-29 ENCOUNTER — Other Ambulatory Visit: Payer: Self-pay

## 2021-03-29 DIAGNOSIS — I129 Hypertensive chronic kidney disease with stage 1 through stage 4 chronic kidney disease, or unspecified chronic kidney disease: Secondary | ICD-10-CM | POA: Insufficient documentation

## 2021-03-29 DIAGNOSIS — Z20822 Contact with and (suspected) exposure to covid-19: Secondary | ICD-10-CM | POA: Insufficient documentation

## 2021-03-29 DIAGNOSIS — I2510 Atherosclerotic heart disease of native coronary artery without angina pectoris: Secondary | ICD-10-CM | POA: Insufficient documentation

## 2021-03-29 DIAGNOSIS — N183 Chronic kidney disease, stage 3 unspecified: Secondary | ICD-10-CM | POA: Insufficient documentation

## 2021-03-29 DIAGNOSIS — Z01812 Encounter for preprocedural laboratory examination: Secondary | ICD-10-CM

## 2021-03-29 DIAGNOSIS — Z951 Presence of aortocoronary bypass graft: Secondary | ICD-10-CM | POA: Insufficient documentation

## 2021-03-29 DIAGNOSIS — E785 Hyperlipidemia, unspecified: Secondary | ICD-10-CM | POA: Insufficient documentation

## 2021-03-29 DIAGNOSIS — Z955 Presence of coronary angioplasty implant and graft: Secondary | ICD-10-CM | POA: Insufficient documentation

## 2021-03-29 DIAGNOSIS — Z01818 Encounter for other preprocedural examination: Secondary | ICD-10-CM

## 2021-03-29 DIAGNOSIS — I739 Peripheral vascular disease, unspecified: Secondary | ICD-10-CM | POA: Insufficient documentation

## 2021-03-29 HISTORY — DX: Chronic kidney disease, unspecified: N18.9

## 2021-03-29 HISTORY — DX: Other complications of anesthesia, initial encounter: T88.59XA

## 2021-03-29 LAB — URINALYSIS, ROUTINE W REFLEX MICROSCOPIC
Bacteria, UA: NONE SEEN
Bilirubin Urine: NEGATIVE
Glucose, UA: NEGATIVE mg/dL
Ketones, ur: NEGATIVE mg/dL
Leukocytes,Ua: NEGATIVE
Nitrite: NEGATIVE
Protein, ur: NEGATIVE mg/dL
Specific Gravity, Urine: 1.012 (ref 1.005–1.030)
pH: 5 (ref 5.0–8.0)

## 2021-03-29 LAB — TYPE AND SCREEN
ABO/RH(D): O POS
Antibody Screen: NEGATIVE

## 2021-03-29 LAB — BASIC METABOLIC PANEL
Anion gap: 9 (ref 5–15)
BUN: 23 mg/dL (ref 8–23)
CO2: 28 mmol/L (ref 22–32)
Calcium: 8.9 mg/dL (ref 8.9–10.3)
Chloride: 101 mmol/L (ref 98–111)
Creatinine, Ser: 1.52 mg/dL — ABNORMAL HIGH (ref 0.61–1.24)
GFR, Estimated: 50 mL/min — ABNORMAL LOW (ref >60)
Glucose, Bld: 103 mg/dL — ABNORMAL HIGH (ref 70–99)
Potassium: 3.7 mmol/L (ref 3.5–5.1)
Sodium: 138 mmol/L (ref 135–145)

## 2021-03-29 LAB — SURGICAL PCR SCREEN
MRSA, PCR: NEGATIVE
Staphylococcus aureus: NEGATIVE

## 2021-03-29 LAB — CBC
HCT: 41.8 % (ref 39.0–52.0)
Hemoglobin: 13.8 g/dL (ref 13.0–17.0)
MCH: 29.7 pg (ref 26.0–34.0)
MCHC: 33 g/dL (ref 30.0–36.0)
MCV: 89.9 fL (ref 80.0–100.0)
Platelets: 319 10*3/uL (ref 150–400)
RBC: 4.65 MIL/uL (ref 4.22–5.81)
RDW: 12.4 % (ref 11.5–15.5)
WBC: 6.4 10*3/uL (ref 4.0–10.5)
nRBC: 0 % (ref 0.0–0.2)

## 2021-03-29 LAB — SARS CORONAVIRUS 2 (TAT 6-24 HRS): SARS Coronavirus 2: NEGATIVE

## 2021-03-29 NOTE — Progress Notes (Signed)
PCP - Peri Maris, [name] Cardiologist - Dr. Elease Hashimoto  PPM/ICD - N/A  Chest x-ray - N/A EKG - 01/26/21 Stress Test - 01/28/21 ECHO - 02/12/21 Cardiac Cath - 03/01/17  Sleep Study - denies CPAP - denies  Blood Thinner Instructions: n/a Aspirin Instructions: Hold 7 Days. Pt reported LD 03/25/21  ERAS Protcol -Clear liquids until 0530 DOS.  COVID TEST- 03/29/21, Done in PAT.  Anesthesia review: Yes, hx of CAD.  Patient denies shortness of breath, fever, cough and chest pain at PAT appointment   All instructions explained to the patient, with a verbal understanding of the material. Patient agrees to go over the instructions while at home for a better understanding. Patient also instructed to self quarantine after being tested for COVID-19. The opportunity to ask questions was provided.

## 2021-03-30 ENCOUNTER — Encounter: Payer: Self-pay | Admitting: Vascular Surgery

## 2021-03-30 ENCOUNTER — Ambulatory Visit (INDEPENDENT_AMBULATORY_CARE_PROVIDER_SITE_OTHER): Payer: Medicare Other | Admitting: Vascular Surgery

## 2021-03-30 ENCOUNTER — Ambulatory Visit: Payer: Self-pay | Admitting: Orthopedic Surgery

## 2021-03-30 DIAGNOSIS — M549 Dorsalgia, unspecified: Secondary | ICD-10-CM | POA: Insufficient documentation

## 2021-03-30 DIAGNOSIS — M5441 Lumbago with sciatica, right side: Secondary | ICD-10-CM | POA: Diagnosis not present

## 2021-03-30 DIAGNOSIS — G8929 Other chronic pain: Secondary | ICD-10-CM | POA: Insufficient documentation

## 2021-03-30 NOTE — Progress Notes (Signed)
Patient name: Derrick [name] MRN: [id number] DOB: 03-Mar-1956 Sex: male  REASON FOR CONSULT: Evaluate for two-level ALIF at L4-L5 and L5-S1  HPI: Derrick [name] is a 65 y.o. male, with history of hypertension, hyperlipidemia, coronary artery disease status post CABG, peripheral vascular disease that presents for evaluation of two-level ALIF at L4-L5 and L5-S1.  Patient has had chronic lower back pain with right lower extremity radiculopathy for years.  He has failed conservative management including injections and physical therapy.  Has been evaluated by Dr. Shon Baton who has asked vascular surgery to evaluate for two-level abdominal exposure at L4-L5 and L5-S1 for ALIF.  He is well-known to our practice and previously had a right common femoral to below-knee popliteal bypass as well as a left common femoral to PT bypass by Dr. Darrick Penna in 2016.  He denies any history of abdominal surgery.  He does note a history of right inguinal hernia that has not been repaired.  Past Medical History:  Diagnosis Date   Chronic kidney disease    "mild"-from taking meloxicam for many years.   Complication of anesthesia    "I was slow to wake up"   Coronary artery disease 2006   stents x2=LAD LCX; totally occluded RCA b. CABG 03/02/17 x2 (left internal mammary artery to LAD, Y-graft right mammary from left mammary to OM).// Myoview 9/22: Inferior infarct, no ischemia, EF 61; low risk // Echocardiogram 9/22: EF 55-60, no RWMA, GLS -24.5%, normal RVSF, RVSP 30.7, mild LAE, trivial MR, trivial AI, mild AV sclerosis w/o AS   Dyslipidemia    GERD (gastroesophageal reflux disease)    History of bronchitis    Hyperlipidemia    statin intolerant   Hyperparathyroidism    Hypertension    Insomnia    Metatarsalgia    Neuropathy    OA (osteoarthritis)    Peripheral vascular disease (HCC)    Seasonal allergies    Urinary frequency    Wears glasses     Past Surgical History:  Procedure Laterality Date   CARDIAC  CATHETERIZATION  10/28/2004   circ and mid LAD chyper stents   CERVICAL FUSION  2007,2009   CERVICAL SPINE SURGERY  2004   COLONOSCOPY     CORONARY ANGIOPLASTY  2006   stents placed    CORONARY ARTERY BYPASS GRAFT N/A 03/02/2017   Procedure: CORONARY ARTERY BYPASS GRAFTING (CABG), ON PUMP, TIMES TWO, USING BILATERAL MAMMARY ARTERIES;  Surgeon: Loreli Slot, MD;  Location: [facility] OR;  Service: Open Heart Surgery;  Laterality: N/A;   ELBOW ARTHROPLASTY  1994   right   ENDARTERECTOMY FEMORAL Left 03/17/2015   Procedure: ENDARTERECTOMY COMMON FEMORAL WITH PROFUNDOPLASTY;  Surgeon: Sherren Kerns, MD;  Location: [facility] OR;  Service: Vascular;  Laterality: Left;   FEMORAL-POPLITEAL BYPASS GRAFT Left 03/17/2015   Procedure: BYPASS GRAFT FEMORAL-POPLITEAL ARTERY-LEFT LEG AND POSTERIOR TIBIAL SEQUENTIAL GRAFT TO BELOW KNEE POPLITEAL ARTERY;  Surgeon: Sherren Kerns, MD;  Location: [facility] OR;  Service: Vascular;  Laterality: Left;   FEMORAL-POPLITEAL BYPASS GRAFT Right 05/11/2015   Procedure:  RIGHT FEMORAL-BELOW THE KNEE POPLITEAL ARTERY BYPASS GRAFT USING NON REVERSE RIGHT GREATER SAPHENOUS VEIN;  Surgeon: Sherren Kerns, MD;  Location: [facility] OR;  Service: Vascular;  Laterality: Right;   INCISION AND DRAINAGE ABSCESS POSTERIOR CERVICALSPINE  2009   INTRAOPERATIVE ARTERIOGRAM Left 03/17/2015   Procedure: INTRA OPERATIVE ARTERIOGRAM X2;  Surgeon: Sherren Kerns, MD;  Location: [facility] OR;  Service: Vascular;  Laterality: Left;   INTRAOPERATIVE ARTERIOGRAM  Right 05/11/2015   Procedure: INTRA OPERATIVE ARTERIOGRAM;  Surgeon: Sherren Kerns, MD;  Location: [facility] OR;  Service: Vascular;  Laterality: Right;   LEFT HEART CATH AND CORONARY ANGIOGRAPHY N/A 03/01/2017   Procedure: LEFT HEART CATH AND CORONARY ANGIOGRAPHY;  Surgeon: Kathleene Hazel, MD;  Location: [facility] INVASIVE CV LAB;  Service: Cardiovascular;  Laterality: N/A;   PATCH ANGIOPLASTY Left 03/17/2015   Procedure: VEIN PATCH ANGIOPLASTY COMMON  FEMORAL ARTERY;  Surgeon: Sherren Kerns, MD;  Location: [facility] OR;  Service: Vascular;  Laterality: Left;   PERIPHERAL VASCULAR CATHETERIZATION N/A 01/16/2015   Procedure: Abdominal Aortogram;  Surgeon: Sherren Kerns, MD;  Location: [facility] INVASIVE CV LAB;  Service: Cardiovascular;  Laterality: N/A;   SHOULDER OPEN ROTATOR CUFF REPAIR Right 04/23/2013   Procedure: RIGHT TWO TENDON ROTATOR CUFF REPAIR  SUBACROMIAL DECOMPRESSION AND OPEN DISTAL CLAVICLE RESECTION;  Surgeon: Wyn Forster., MD;  Location: [facility];  Service: Orthopedics;  Laterality: Right;   stents placed   2006   TEE WITHOUT CARDIOVERSION N/A 03/02/2017   Procedure: TRANSESOPHAGEAL ECHOCARDIOGRAM (TEE);  Surgeon: Loreli Slot, MD;  Location: [facility] OR;  Service: Open Heart Surgery;  Laterality: N/A;   VEIN HARVEST Right 05/11/2015   Procedure: WITH NON REVERSE RIGHT GREATER SAPHENOUS VEIN HARVEST;  Surgeon: Sherren Kerns, MD;  Location: [facility] OR;  Service: Vascular;  Laterality: Right;    Family History  Problem Relation Age of Onset   Arthritis Mother    Varicose Veins Mother    Lung cancer Father    COPD Father        Lung   Cancer Brother        Lukemia   COPD Brother    Cancer Brother        Prostate    SOCIAL HISTORY: Social History   Socioeconomic History   Marital status: Married    Spouse name: Not on file   Number of children: 1   Years of education: 12   Highest education level: High school graduate  Occupational History   Occupation: Retired  Tobacco Use   Smoking status: Former    Types: Cigarettes    Quit date: 05/30/1989    Years since quitting: 31.8   Smokeless tobacco: Never  Vaping Use   Vaping Use: Never used  Substance and Sexual Activity   Alcohol use: Yes    Comment: 1 beer every 6 months   Drug use: Never   Sexual activity: Not on file  Other Topics Concern   Not on file  Social History Narrative   Lives with his wife.   One stepson.   Left-handed.    One cup caffeine per week.    Social Determinants of Health   Financial Resource Strain: Not on file  Food Insecurity: Not on file  Transportation Needs: Not on file  Physical Activity: Not on file  Stress: Not on file  Social Connections: Not on file  Intimate Partner Violence: Not on file    Allergies  Allergen Reactions   Codeine Itching and Other (See Comments)   Atorvastatin Other (See Comments)    Muscle aches Muscle aches   Crestor [Rosuvastatin Calcium]     Muscle aches   Meloxicam Other (See Comments)    Can not take due to kidneys   Statins Other (See Comments)    myalgia    Current Outpatient Medications  Medication Sig Dispense Refill   acetaminophen (TYLENOL) 650 MG CR tablet Take 1,300  mg by mouth in the morning and at bedtime.     amLODipine (NORVASC) 5 MG tablet Take 5 mg by mouth daily.     ASPIRIN 81 PO Take 81 mg by mouth daily.     calcitRIOL (ROCALTROL) 0.5 MCG capsule Take 0.5 mcg by mouth daily.     calcium carbonate (OSCAL) 1500 (600 Ca) MG TABS tablet Take 600 mg of elemental calcium by mouth 2 (two) times daily with a meal.     ezetimibe (ZETIA) 10 MG tablet Take 10 mg by mouth every evening.     famotidine (PEPCID) 20 MG tablet Take 20 mg by mouth 2 (two) times daily.     fenofibrate 160 MG tablet Take 160 mg by mouth every evening.     gabapentin (NEURONTIN) 300 MG capsule Take 600 mg by mouth at bedtime.     hydrochlorothiazide (HYDRODIURIL) 25 MG tablet Take 25 mg by mouth every evening.     inclisiran (LEQVIO) 284 MG/1.5ML SOSY injection Inject 284 mg into the skin See admin instructions. Inject 1.72mL SQ every 3 months     ipratropium (ATROVENT) 0.03 % nasal spray Place 1 spray into both nostrils 2 (two) times daily as needed for rhinitis.     losartan (COZAAR) 100 MG tablet Take 100 mg by mouth every evening.      Magnesium 250 MG TABS Take 250 mg by mouth every evening.     MAGNESIUM PO Take 250 mg by mouth daily.     metoprolol tartrate  (LOPRESSOR) 25 MG tablet Take 0.5 tablets (12.5 mg total) by mouth 2 (two) times daily. 90 tablet 3   nitroGLYCERIN (NITROSTAT) 0.4 MG SL tablet Place 1 tablet (0.4 mg total) under the tongue every 5 (five) minutes as needed for chest pain. 75 tablet 1   Omega-3 Fatty Acids (OMEGA-3 EPA FISH OIL PO) Take 2 tablets by mouth 2 (two) times daily.      potassium chloride (KLOR-CON) 10 MEQ tablet Take 1 tablet (10 mEq total) by mouth daily. 90 tablet 3   pravastatin (PRAVACHOL) 20 MG tablet Take 20 mg by mouth every evening.     Propylene Glycol (SYSTANE BALANCE) 0.6 % SOLN Place 1 drop into both eyes daily as needed (dry eyes).     tamsulosin (FLOMAX) 0.4 MG CAPS capsule Take 0.4 mg by mouth every evening.     traZODone (DESYREL) 50 MG tablet Take 50 mg by mouth at bedtime.     vitamin B-12 (CYANOCOBALAMIN) 500 MCG tablet Take 500 mcg by mouth daily.     No current facility-administered medications for this visit.    REVIEW OF SYSTEMS:  [X]  denotes positive finding, [ ]  denotes negative finding Cardiac  Comments:  Chest pain or chest pressure:    Shortness of breath upon exertion:    Short of breath when lying flat:    Irregular heart rhythm:        Vascular    Pain in calf, thigh, or hip brought on by ambulation:    Pain in feet at night that wakes you up from your sleep:     Blood clot in your veins:    Leg swelling:         Pulmonary    Oxygen at home:    Productive cough:     Wheezing:         Neurologic    Sudden weakness in arms or legs:     Sudden numbness in arms or legs:  Sudden onset of difficulty speaking or slurred speech:    Temporary loss of vision in one eye:     Problems with dizziness:         Gastrointestinal    Blood in stool:     Vomited blood:         Genitourinary    Burning when urinating:     Blood in urine:        Psychiatric    Major depression:         Hematologic    Bleeding problems:    Problems with blood clotting too easily:         Skin    Rashes or ulcers:        Constitutional    Fever or chills:      PHYSICAL EXAM: Vitals:   03/30/21 0805  BP: 138/77  Pulse: 66  Temp: 98.3 F (36.8 C)  TempSrc: Skin  Weight: 216 lb (98 kg)  Height: 6\' 2"  (1.88 m)    GENERAL: The patient is a well-nourished male, in no acute distress. The vital signs are documented above. CARDIAC: There is a regular rate and rhythm.  VASCULAR:  Palpable femoral pulses bilaterally Palpable DP pulses bilaterally PULMONARY: No respiratory distress. ABDOMEN: Soft and non-tender. MUSCULOSKELETAL: There are no major deformities or cyanosis. NEUROLOGIC: No focal weakness or paresthesias are detected. SKIN: There are no ulcers or rashes noted. PSYCHIATRIC: The patient has a normal affect.  DATA:   CT abdomen pelvis reviewed from 11/27/2019 and he has a markedly circumferentially calcified infrarenal aorta and bilateral iliac arteries with iliac stents.  Assessment/Plan:  65 year old male with chronic lower back pain and right lower extremity radiculopathy that presents for evaluation of two-level ALIF at L4-L5 and L5-S1.  I have reviewed his CT scan from last year and he has a circumferentially calcified infrarenal aorta and bilateral iliac arteries with existing iliac stents.  I discussed with Dr. 79 that I do not think he is a candidate for anterior approach at L4-L5 given the marked calcification and risk for vessel injury.  Instead we have offered anterior approach at L5-S1 through a lower transverse incision and then rolling him on his side and doing an oblique exposure at L4-L5 which I think will be much safer.  The oblique approach will be much more about moving the psoas muscle and less about moving his calcified vessels.  I reviewed doing this through two separate incisions with the patient and discussed we would be mobilizing peritoneum and intestines as well as the left ureter and iliac artery and vein and risk of injury to the  above structures.  He wishes to proceed.  All questions answered.     Shon Baton, MD [facility] Office: [phone]

## 2021-03-30 NOTE — Anesthesia Preprocedure Evaluation (Addendum)
Anesthesia Evaluation    Reviewed: Allergy & Precautions, Patient's Chart, lab work & pertinent test results, Unable to perform ROS - Chart review only  Airway Mallampati: II  TM Distance: >3 FB Neck ROM: Full    Dental  (+) Dental Advisory Given, Teeth Intact   Pulmonary neg pulmonary ROS, former smoker,    Pulmonary exam normal breath sounds clear to auscultation       Cardiovascular hypertension, Pt. on medications + angina + CAD, + CABG and + Peripheral Vascular Disease  Normal cardiovascular exam Rhythm:Regular Rate:Normal  Echo TTE 02/12/2021: 1. Left ventricular ejection fraction, by estimation, is 55 to 60%. Left ventricular ejection fraction by 3D volume is 58 %. The left ventricle has normal function. The left ventricle has no regional wall motion abnormalities. Left ventricular diastolicparameters were normal. The average left ventricular global longitudinal strain is -24.5 %. The global longitudinal strain is normal.  2. Right ventricular systolic function is normal. The right ventricular size is normal. There is normal pulmonary artery systolic pressure. The estimated right ventricular systolic pressure is 30.7 mmHg.  3. Left atrial size was mildly dilated.  4. The mitral valve is normal in structure. Trivial mitral valve regurgitation. No evidence of mitral stenosis.  5. The aortic valve is tricuspid. There is mild calcification of the aortic valve. Aortic valve regurgitation is trivial. Mild aortic valve sclerosis is present, with no evidence of aortic valve stenosis.  6. The inferior vena cava is normal in size with greater than 50% respiratory variability, suggesting right atrial pressure of 3 mmHg.    Neuro/Psych  Neuromuscular disease    GI/Hepatic Neg liver ROS, GERD  ,  Endo/Other  negative endocrine ROS  Renal/GU Renal InsufficiencyRenal disease     Musculoskeletal  (+) Arthritis ,   Abdominal    Peds  Hematology negative hematology ROS (+)   Anesthesia Other Findings   Reproductive/Obstetrics                           Anesthesia Physical Anesthesia Plan  ASA: 3  Anesthesia Plan: General   Post-op Pain Management: GA combined w/ Regional for post-op pain   Induction: Intravenous  PONV Risk Score and Plan: 4 or greater and Ondansetron, Dexamethasone, Treatment may vary due to age or medical condition and Midazolam  Airway Management Planned: Oral ETT  Additional Equipment: Arterial line  Intra-op Plan:   Post-operative Plan: Extubation in OR  Informed Consent: I have reviewed the patients History and Physical, chart, labs and discussed the procedure including the risks, benefits and alternatives for the proposed anesthesia with the patient or authorized representative who has indicated his/her understanding and acceptance.     Dental advisory given  Plan Discussed with: CRNA  Anesthesia Plan Comments: (2 x PIV  PAT note by Antionette Poles, PA-C: Follows with cardiology for history of HTN, HLD, sinus bradycardia, CAD s/p PCI to LAD and LCx with subsequent CABG in 2018 (complicated by postop A. fib), PAD (s/p Fem-Post Tib bypass 2016, R Fem-Pop 2016, bilat CIA stents in 2016).  Cardiac clearance per telephone encounter 02/16/2021, "Chart reviewed as part of pre-operative protocol coverage.Patient was recently seen by Tereso Newcomer PA-C for pre-op clearance. He underwent nuclear stress testing and echocardiogram. On nuclear stress test, Scott commented, "Stress test is okay. His old heart attack is demonstrated but there is no ischemia." On echocardiogram, Scott commented, "Echocardiogram shows normal ejection fraction and no significant valve disease. - Continue current  medications/treatment plan and follow up as scheduled. - Pt may proceed with surgery at acceptable risk. - If needed ASA can be held for up to 7 days prior to surgery and resumed post  op as soon as it is felt to be safe.""  CKD 3 followed by Dr. Hyman Hopes.  Preop labs reviewed, creatinine elevated 1.52 consistent with history of CKD, otherwise unremarkable.  EKG 01/26/2021: Sinus bradycardia.  Rate 53.  TTE 02/12/2021: 1. Left ventricular ejection fraction, by estimation, is 55 to 60%. Left  ventricular ejection fraction by 3D volume is 58 %. The left ventricle has  normal function. The left ventricle has no regional wall motion  abnormalities. Left ventricular diastolic  parameters were normal. The average left ventricular global longitudinal  strain is -24.5 %. The global longitudinal strain is normal.  2. Right ventricular systolic function is normal. The right ventricular  size is normal. There is normal pulmonary artery systolic pressure. The  estimated right ventricular systolic pressure is 30.7 mmHg.  3. Left atrial size was mildly dilated.  4. The mitral valve is normal in structure. Trivial mitral valve  regurgitation. No evidence of mitral stenosis.  5. The aortic valve is tricuspid. There is mild calcification of the  aortic valve. Aortic valve regurgitation is trivial. Mild aortic valve  sclerosis is present, with no evidence of aortic valve stenosis.  6. The inferior vena cava is normal in size with greater than 50%  respiratory variability, suggesting right atrial pressure of 3 mmHg.   Comparison(s): No significant change from prior study. Study 03/02/2017 was  TEE, no prior TTE available.   Conclusion(s)/Recommendation(s): Otherwise normal echocardiogram, with  minor abnormalities described in the report.   Nuclear stress 01/28/2021: . Findings are consistent with prior myocardial infarction. The study is low risk. . No ST deviation was noted. . LV perfusion is abnormal. There is no evidence of ischemia. There is evidence of infarction. Defect 1: There is a medium defect with mild reduction in uptake present in the apical to basal inferior  location(s) that is fixed. There is normal wall motion in the defect area. Consistent with infarction. . Left ventricular function is normal. Nuclear stress EF: 61 %. The left ventricular ejection fraction is normal (55-65%). End diastolic cavity size is normal. . Prior study available for comparison from 03/06/2015. No changes compared to prior study. )      Anesthesia Quick Evaluation

## 2021-03-30 NOTE — Progress Notes (Signed)
Anesthesia Chart Review:  Follows with cardiology for history of HTN, HLD, sinus bradycardia, CAD s/p PCI to LAD and LCx with subsequent CABG in 2018 (complicated by postop A. fib), PAD (s/p Fem-Post Tib bypass 2016, R Fem-Pop 2016, bilat CIA stents in 2016).  Cardiac clearance per telephone encounter 02/16/2021, "Chart reviewed as part of pre-operative protocol coverage. Patient was recently seen by Tereso Newcomer PA-C for pre-op clearance. He underwent nuclear stress testing and echocardiogram. On nuclear stress test, Scott commented, "Stress test is okay.  His old heart attack is demonstrated but there is no ischemia." On echocardiogram, Scott commented, "Echocardiogram shows normal ejection fraction and no significant valve disease. - Continue current medications/treatment plan and follow up as scheduled. - Pt may proceed with surgery at acceptable risk. - If needed ASA can be held for up to 7 days prior to surgery and resumed post op as soon as it is felt to be safe.""  CKD 3 followed by Dr. Hyman Hopes.  Preop labs reviewed, creatinine elevated 1.52 consistent with history of CKD, otherwise unremarkable.  EKG 01/26/2021: Sinus bradycardia.  Rate 53.  TTE 02/12/2021:  1. Left ventricular ejection fraction, by estimation, is 55 to 60%. Left  ventricular ejection fraction by 3D volume is 58 %. The left ventricle has  normal function. The left ventricle has no regional wall motion  abnormalities. Left ventricular diastolic   parameters were normal. The average left ventricular global longitudinal  strain is -24.5 %. The global longitudinal strain is normal.   2. Right ventricular systolic function is normal. The right ventricular  size is normal. There is normal pulmonary artery systolic pressure. The  estimated right ventricular systolic pressure is 30.7 mmHg.   3. Left atrial size was mildly dilated.   4. The mitral valve is normal in structure. Trivial mitral valve  regurgitation. No evidence of  mitral stenosis.   5. The aortic valve is tricuspid. There is mild calcification of the  aortic valve. Aortic valve regurgitation is trivial. Mild aortic valve  sclerosis is present, with no evidence of aortic valve stenosis.   6. The inferior vena cava is normal in size with greater than 50%  respiratory variability, suggesting right atrial pressure of 3 mmHg.   Comparison(s): No significant change from prior study. Study 03/02/2017 was  TEE, no prior TTE available.   Conclusion(s)/Recommendation(s): Otherwise normal echocardiogram, with  minor abnormalities described in the report.   Nuclear stress 01/28/2021:   Findings are consistent with prior myocardial infarction. The study is low risk.   No ST deviation was noted.   LV perfusion is abnormal. There is no evidence of ischemia. There is evidence of infarction. Defect 1: There is a medium defect with mild reduction in uptake present in the apical to basal inferior location(s) that is fixed. There is normal wall motion in the defect area. Consistent with infarction.   Left ventricular function is normal. Nuclear stress EF: 61 %. The left ventricular ejection fraction is normal (55-65%). End diastolic cavity size is normal.   Prior study available for comparison from 03/06/2015. No changes compared to prior study.    Zannie Cove [facility]/Anesthesiology Phone [phone] 03/30/2021 8:56 AM

## 2021-03-31 ENCOUNTER — Inpatient Hospital Stay (HOSPITAL_COMMUNITY)
Admission: RE | Admit: 2021-03-31 | Discharge: 2021-04-03 | DRG: 455 | Disposition: A | Discharge status: Home / Self Care | Payer: Medicare Other | Attending: Orthopedic Surgery | Admitting: Orthopedic Surgery

## 2021-03-31 ENCOUNTER — Encounter (HOSPITAL_COMMUNITY): Payer: Self-pay | Admitting: Orthopedic Surgery

## 2021-03-31 ENCOUNTER — Inpatient Hospital Stay (HOSPITAL_COMMUNITY): Payer: Medicare Other

## 2021-03-31 ENCOUNTER — Inpatient Hospital Stay (HOSPITAL_COMMUNITY): Payer: Medicare Other | Admitting: Anesthesiology

## 2021-03-31 ENCOUNTER — Inpatient Hospital Stay (HOSPITAL_COMMUNITY): Payer: Medicare Other | Admitting: Physician Assistant

## 2021-03-31 ENCOUNTER — Encounter (HOSPITAL_COMMUNITY)
Admission: RE | Disposition: A | Discharge status: Home / Self Care | Payer: Self-pay | Source: Home / Self Care | Attending: Orthopedic Surgery

## 2021-03-31 DIAGNOSIS — M5116 Intervertebral disc disorders with radiculopathy, lumbar region: Secondary | ICD-10-CM | POA: Diagnosis present

## 2021-03-31 DIAGNOSIS — Z79899 Other long term (current) drug therapy: Secondary | ICD-10-CM | POA: Diagnosis not present

## 2021-03-31 DIAGNOSIS — Z801 Family history of malignant neoplasm of trachea, bronchus and lung: Secondary | ICD-10-CM

## 2021-03-31 DIAGNOSIS — I739 Peripheral vascular disease, unspecified: Secondary | ICD-10-CM | POA: Diagnosis present

## 2021-03-31 DIAGNOSIS — Z951 Presence of aortocoronary bypass graft: Secondary | ICD-10-CM

## 2021-03-31 DIAGNOSIS — I2510 Atherosclerotic heart disease of native coronary artery without angina pectoris: Secondary | ICD-10-CM | POA: Diagnosis present

## 2021-03-31 DIAGNOSIS — E785 Hyperlipidemia, unspecified: Secondary | ICD-10-CM | POA: Diagnosis present

## 2021-03-31 DIAGNOSIS — N183 Chronic kidney disease, stage 3 unspecified: Secondary | ICD-10-CM | POA: Diagnosis present

## 2021-03-31 DIAGNOSIS — Z981 Arthrodesis status: Secondary | ICD-10-CM

## 2021-03-31 DIAGNOSIS — I129 Hypertensive chronic kidney disease with stage 1 through stage 4 chronic kidney disease, or unspecified chronic kidney disease: Secondary | ICD-10-CM | POA: Diagnosis present

## 2021-03-31 DIAGNOSIS — M4807 Spinal stenosis, lumbosacral region: Secondary | ICD-10-CM | POA: Diagnosis present

## 2021-03-31 DIAGNOSIS — Z87891 Personal history of nicotine dependence: Secondary | ICD-10-CM | POA: Diagnosis not present

## 2021-03-31 DIAGNOSIS — M5117 Intervertebral disc disorders with radiculopathy, lumbosacral region: Secondary | ICD-10-CM | POA: Diagnosis present

## 2021-03-31 DIAGNOSIS — G8929 Other chronic pain: Secondary | ICD-10-CM | POA: Diagnosis present

## 2021-03-31 DIAGNOSIS — K219 Gastro-esophageal reflux disease without esophagitis: Secondary | ICD-10-CM | POA: Diagnosis present

## 2021-03-31 DIAGNOSIS — Z7982 Long term (current) use of aspirin: Secondary | ICD-10-CM

## 2021-03-31 DIAGNOSIS — I252 Old myocardial infarction: Secondary | ICD-10-CM | POA: Diagnosis not present

## 2021-03-31 DIAGNOSIS — Z955 Presence of coronary angioplasty implant and graft: Secondary | ICD-10-CM | POA: Diagnosis not present

## 2021-03-31 DIAGNOSIS — Z20822 Contact with and (suspected) exposure to covid-19: Secondary | ICD-10-CM | POA: Diagnosis present

## 2021-03-31 DIAGNOSIS — M48062 Spinal stenosis, lumbar region with neurogenic claudication: Secondary | ICD-10-CM | POA: Diagnosis present

## 2021-03-31 DIAGNOSIS — M5416 Radiculopathy, lumbar region: Secondary | ICD-10-CM

## 2021-03-31 DIAGNOSIS — Z419 Encounter for procedure for purposes other than remedying health state, unspecified: Secondary | ICD-10-CM

## 2021-03-31 DIAGNOSIS — E213 Hyperparathyroidism, unspecified: Secondary | ICD-10-CM | POA: Diagnosis present

## 2021-03-31 HISTORY — PX: ANTERIOR LUMBAR FUSION: SHX1170

## 2021-03-31 HISTORY — PX: ABDOMINAL EXPOSURE: SHX5708

## 2021-03-31 SURGERY — ANTERIOR LUMBAR FUSION 1 LEVEL
Anesthesia: General

## 2021-03-31 MED ORDER — GABAPENTIN 300 MG PO CAPS
600.0000 mg | ORAL_CAPSULE | Freq: Every day | ORAL | Status: DC
  Administered 2021-03-31: 600 mg via ORAL
  Filled 2021-03-31: qty 2

## 2021-03-31 MED ORDER — ONDANSETRON HCL 4 MG/2ML IJ SOLN
INTRAMUSCULAR | Status: AC
  Filled 2021-03-31: qty 2

## 2021-03-31 MED ORDER — SODIUM CHLORIDE 0.9% FLUSH: 3.0000 mL | INTRAVENOUS | Status: DC | PRN

## 2021-03-31 MED ORDER — SUGAMMADEX SODIUM 200 MG/2ML IV SOLN
INTRAVENOUS | Status: DC | PRN
  Administered 2021-03-31: 100 mg via INTRAVENOUS
  Administered 2021-03-31: 200 mg via INTRAVENOUS

## 2021-03-31 MED ORDER — ONDANSETRON HCL 4 MG/2ML IJ SOLN: 4.0000 mg | Freq: Four times a day (QID) | INTRAMUSCULAR | Status: DC | PRN

## 2021-03-31 MED ORDER — HYDROMORPHONE HCL 1 MG/ML IJ SOLN
1.0000 mg | INTRAMUSCULAR | Status: DC | PRN
  Administered 2021-03-31 (×2): 1 mg via INTRAVENOUS
  Filled 2021-03-31 (×2): qty 1

## 2021-03-31 MED ORDER — CHLORHEXIDINE GLUCONATE CLOTH 2 % EX PADS: 6.0000 | MEDICATED_PAD | Freq: Once | CUTANEOUS | Status: DC

## 2021-03-31 MED ORDER — LIDOCAINE 2% (20 MG/ML) 5 ML SYRINGE
INTRAMUSCULAR | Status: AC
  Filled 2021-03-31: qty 5

## 2021-03-31 MED ORDER — FLEET ENEMA 7-19 GM/118ML RE ENEM: 1.0000 | ENEMA | Freq: Once | RECTAL | Status: DC | PRN

## 2021-03-31 MED ORDER — EPHEDRINE SULFATE-NACL 50-0.9 MG/10ML-% IV SOSY
PREFILLED_SYRINGE | INTRAVENOUS | Status: DC | PRN
  Administered 2021-03-31 (×2): 10 mg via INTRAVENOUS
  Administered 2021-03-31: 5 mg via INTRAVENOUS

## 2021-03-31 MED ORDER — ACETAMINOPHEN 650 MG RE SUPP: 650.0000 mg | RECTAL | Status: DC | PRN

## 2021-03-31 MED ORDER — EZETIMIBE 10 MG PO TABS
10.0000 mg | ORAL_TABLET | Freq: Every evening | ORAL | Status: DC
  Administered 2021-03-31 – 2021-04-02 (×3): 10 mg via ORAL
  Filled 2021-03-31 (×3): qty 1

## 2021-03-31 MED ORDER — BUPIVACAINE LIPOSOME 1.3 % IJ SUSP
INTRAMUSCULAR | Status: DC | PRN
  Administered 2021-03-31: 10 mL

## 2021-03-31 MED ORDER — MIDAZOLAM HCL 2 MG/2ML IJ SOLN
INTRAMUSCULAR | Status: AC
  Filled 2021-03-31: qty 2

## 2021-03-31 MED ORDER — METHOCARBAMOL 1000 MG/10ML IJ SOLN
500.0000 mg | Freq: Four times a day (QID) | INTRAVENOUS | Status: DC | PRN
  Filled 2021-03-31: qty 5

## 2021-03-31 MED ORDER — AMLODIPINE BESYLATE 5 MG PO TABS
5.0000 mg | ORAL_TABLET | Freq: Every day | ORAL | Status: DC
  Administered 2021-04-02 – 2021-04-03 (×2): 5 mg via ORAL
  Filled 2021-03-31 (×2): qty 1

## 2021-03-31 MED ORDER — TAMSULOSIN HCL 0.4 MG PO CAPS
0.4000 mg | ORAL_CAPSULE | Freq: Every evening | ORAL | Status: DC
  Administered 2021-03-31 – 2021-04-02 (×3): 0.4 mg via ORAL
  Filled 2021-03-31 (×3): qty 1

## 2021-03-31 MED ORDER — FENTANYL CITRATE (PF) 250 MCG/5ML IJ SOLN
INTRAMUSCULAR | Status: DC | PRN
  Administered 2021-03-31: 100 ug via INTRAVENOUS
  Administered 2021-03-31 (×3): 50 ug via INTRAVENOUS

## 2021-03-31 MED ORDER — METOPROLOL TARTRATE 12.5 MG HALF TABLET
12.5000 mg | ORAL_TABLET | Freq: Two times a day (BID) | ORAL | Status: DC
  Administered 2021-03-31 – 2021-04-03 (×5): 12.5 mg via ORAL
  Filled 2021-03-31 (×5): qty 1

## 2021-03-31 MED ORDER — CHLORHEXIDINE GLUCONATE 0.12 % MT SOLN
OROMUCOSAL | Status: AC
  Administered 2021-03-31: 15 mL via OROMUCOSAL
  Filled 2021-03-31: qty 15

## 2021-03-31 MED ORDER — DEXAMETHASONE SODIUM PHOSPHATE 10 MG/ML IJ SOLN
INTRAMUSCULAR | Status: AC
  Filled 2021-03-31: qty 1

## 2021-03-31 MED ORDER — ROCURONIUM BROMIDE 10 MG/ML (PF) SYRINGE
PREFILLED_SYRINGE | INTRAVENOUS | Status: AC
  Filled 2021-03-31: qty 10

## 2021-03-31 MED ORDER — SODIUM CHLORIDE 0.9 % IV SOLN: 250.0000 mL | INTRAVENOUS | Status: DC

## 2021-03-31 MED ORDER — LIDOCAINE 2% (20 MG/ML) 5 ML SYRINGE
INTRAMUSCULAR | Status: DC | PRN
  Administered 2021-03-31: 100 mg via INTRAVENOUS

## 2021-03-31 MED ORDER — HYDROMORPHONE HCL 1 MG/ML IJ SOLN
INTRAMUSCULAR | Status: AC
  Administered 2021-03-31: 0.5 mg via INTRAVENOUS
  Filled 2021-03-31: qty 1

## 2021-03-31 MED ORDER — DEXAMETHASONE SODIUM PHOSPHATE 10 MG/ML IJ SOLN
INTRAMUSCULAR | Status: DC | PRN
  Administered 2021-03-31: 10 mg via INTRAVENOUS

## 2021-03-31 MED ORDER — CEFAZOLIN SODIUM-DEXTROSE 2-4 GM/100ML-% IV SOLN
2.0000 g | INTRAVENOUS | Status: AC
  Administered 2021-03-31: 2 g via INTRAVENOUS
  Filled 2021-03-31: qty 100

## 2021-03-31 MED ORDER — DOCUSATE SODIUM 100 MG PO CAPS
100.0000 mg | ORAL_CAPSULE | Freq: Two times a day (BID) | ORAL | Status: DC
  Administered 2021-03-31: 100 mg via ORAL
  Filled 2021-03-31: qty 1

## 2021-03-31 MED ORDER — 0.9 % SODIUM CHLORIDE (POUR BTL) OPTIME
TOPICAL | Status: DC | PRN
  Administered 2021-03-31: 2000 mL

## 2021-03-31 MED ORDER — PROPOFOL 10 MG/ML IV BOLUS
INTRAVENOUS | Status: DC | PRN
  Administered 2021-03-31: 150 mg via INTRAVENOUS

## 2021-03-31 MED ORDER — DEXMEDETOMIDINE (PRECEDEX) IN NS 20 MCG/5ML (4 MCG/ML) IV SYRINGE
PREFILLED_SYRINGE | INTRAVENOUS | Status: AC
  Filled 2021-03-31: qty 5

## 2021-03-31 MED ORDER — OXYCODONE HCL 5 MG PO TABS
ORAL_TABLET | ORAL | Status: AC
  Filled 2021-03-31: qty 1

## 2021-03-31 MED ORDER — MENTHOL 3 MG MT LOZG: 1.0000 | LOZENGE | OROMUCOSAL | Status: DC | PRN

## 2021-03-31 MED ORDER — PROMETHAZINE HCL 25 MG/ML IJ SOLN: 6.2500 mg | INTRAMUSCULAR | Status: DC | PRN

## 2021-03-31 MED ORDER — ACETAMINOPHEN 325 MG PO TABS
650.0000 mg | ORAL_TABLET | ORAL | Status: DC | PRN
  Administered 2021-03-31 – 2021-04-01 (×2): 650 mg via ORAL
  Filled 2021-03-31 (×2): qty 2

## 2021-03-31 MED ORDER — PHENYLEPHRINE 40 MCG/ML (10ML) SYRINGE FOR IV PUSH (FOR BLOOD PRESSURE SUPPORT)
PREFILLED_SYRINGE | INTRAVENOUS | Status: AC
  Filled 2021-03-31: qty 10

## 2021-03-31 MED ORDER — FENTANYL CITRATE (PF) 250 MCG/5ML IJ SOLN
INTRAMUSCULAR | Status: AC
  Filled 2021-03-31: qty 5

## 2021-03-31 MED ORDER — ORAL CARE MOUTH RINSE: 15.0000 mL | Freq: Once | OROMUCOSAL | Status: AC

## 2021-03-31 MED ORDER — POLYETHYLENE GLYCOL 3350 17 G PO PACK: 17.0000 g | PACK | Freq: Every day | ORAL | Status: DC | PRN

## 2021-03-31 MED ORDER — METHOCARBAMOL 500 MG PO TABS
500.0000 mg | ORAL_TABLET | Freq: Four times a day (QID) | ORAL | Status: DC | PRN
  Administered 2021-03-31 – 2021-04-01 (×2): 500 mg via ORAL
  Filled 2021-03-31 (×2): qty 1

## 2021-03-31 MED ORDER — ONDANSETRON HCL 4 MG/2ML IJ SOLN
INTRAMUSCULAR | Status: DC | PRN
  Administered 2021-03-31: 4 mg via INTRAVENOUS

## 2021-03-31 MED ORDER — DEXMEDETOMIDINE (PRECEDEX) IN NS 20 MCG/5ML (4 MCG/ML) IV SYRINGE
PREFILLED_SYRINGE | INTRAVENOUS | Status: DC | PRN
  Administered 2021-03-31: 12 ug via INTRAVENOUS

## 2021-03-31 MED ORDER — EPHEDRINE 5 MG/ML INJ
INTRAVENOUS | Status: AC
  Filled 2021-03-31: qty 5

## 2021-03-31 MED ORDER — CEFAZOLIN SODIUM-DEXTROSE 1-4 GM/50ML-% IV SOLN
1.0000 g | Freq: Three times a day (TID) | INTRAVENOUS | Status: AC
  Administered 2021-03-31 (×2): 1 g via INTRAVENOUS
  Filled 2021-03-31 (×2): qty 50

## 2021-03-31 MED ORDER — PROPOFOL 10 MG/ML IV BOLUS
INTRAVENOUS | Status: AC
  Filled 2021-03-31: qty 20

## 2021-03-31 MED ORDER — MIDAZOLAM HCL 2 MG/2ML IJ SOLN
INTRAMUSCULAR | Status: DC | PRN
  Administered 2021-03-31: 2 mg via INTRAVENOUS

## 2021-03-31 MED ORDER — SODIUM CHLORIDE 0.9% FLUSH: 3.0000 mL | Freq: Two times a day (BID) | INTRAVENOUS | Status: DC

## 2021-03-31 MED ORDER — GLYCOPYRROLATE PF 0.2 MG/ML IJ SOSY
PREFILLED_SYRINGE | INTRAMUSCULAR | Status: DC | PRN
  Administered 2021-03-31: .2 mg via INTRAVENOUS

## 2021-03-31 MED ORDER — HYDROCHLOROTHIAZIDE 25 MG PO TABS
25.0000 mg | ORAL_TABLET | Freq: Every evening | ORAL | Status: DC
  Administered 2021-03-31 – 2021-04-02 (×3): 25 mg via ORAL
  Filled 2021-03-31 (×3): qty 1

## 2021-03-31 MED ORDER — PHENOL 1.4 % MT LIQD: 1.0000 | OROMUCOSAL | Status: DC | PRN

## 2021-03-31 MED ORDER — HYDROMORPHONE HCL 1 MG/ML IJ SOLN
0.2500 mg | INTRAMUSCULAR | Status: DC | PRN
  Administered 2021-03-31 (×2): 0.25 mg via INTRAVENOUS

## 2021-03-31 MED ORDER — PHENYLEPHRINE HCL-NACL 20-0.9 MG/250ML-% IV SOLN
INTRAVENOUS | Status: DC | PRN
  Administered 2021-03-31: 100 ug/min via INTRAVENOUS

## 2021-03-31 MED ORDER — OXYCODONE HCL 5 MG PO TABS
10.0000 mg | ORAL_TABLET | ORAL | Status: DC | PRN
  Administered 2021-03-31 – 2021-04-01 (×3): 10 mg via ORAL
  Filled 2021-03-31 (×3): qty 2

## 2021-03-31 MED ORDER — SURGIFLO WITH THROMBIN (HEMOSTATIC MATRIX KIT) OPTIME
TOPICAL | Status: DC | PRN
  Administered 2021-03-31: 2 via TOPICAL

## 2021-03-31 MED ORDER — BUPIVACAINE HCL (PF) 0.5 % IJ SOLN
INTRAMUSCULAR | Status: DC | PRN
  Administered 2021-03-31: 10 mL

## 2021-03-31 MED ORDER — OXYCODONE HCL 5 MG PO TABS
5.0000 mg | ORAL_TABLET | ORAL | Status: DC | PRN
  Administered 2021-03-31: 5 mg via ORAL

## 2021-03-31 MED ORDER — FAMOTIDINE 20 MG PO TABS
20.0000 mg | ORAL_TABLET | Freq: Two times a day (BID) | ORAL | Status: DC
  Administered 2021-03-31 – 2021-04-03 (×5): 20 mg via ORAL
  Filled 2021-03-31 (×5): qty 1

## 2021-03-31 MED ORDER — LACTATED RINGERS IV SOLN: INTRAVENOUS | Status: DC

## 2021-03-31 MED ORDER — LACTATED RINGERS IV SOLN: INTRAVENOUS | Status: DC | PRN

## 2021-03-31 MED ORDER — ROCURONIUM BROMIDE 10 MG/ML (PF) SYRINGE
PREFILLED_SYRINGE | INTRAVENOUS | Status: DC | PRN
Start: 2021-03-31 — End: 2021-03-31
  Administered 2021-03-31: 30 mg via INTRAVENOUS
  Administered 2021-03-31: 40 mg via INTRAVENOUS
  Administered 2021-03-31: 70 mg via INTRAVENOUS
  Administered 2021-03-31: 20 mg via INTRAVENOUS
  Administered 2021-03-31: 40 mg via INTRAVENOUS

## 2021-03-31 MED ORDER — CHLORHEXIDINE GLUCONATE 0.12 % MT SOLN: 15.0000 mL | Freq: Once | OROMUCOSAL | Status: AC

## 2021-03-31 MED ORDER — PHENYLEPHRINE HCL (PRESSORS) 10 MG/ML IV SOLN
INTRAVENOUS | Status: DC | PRN
  Administered 2021-03-31: 80 ug via INTRAVENOUS
  Administered 2021-03-31 (×2): 40 ug via INTRAVENOUS
  Administered 2021-03-31 (×3): 80 ug via INTRAVENOUS

## 2021-03-31 MED ORDER — NITROGLYCERIN 0.4 MG SL SUBL: 0.4000 mg | SUBLINGUAL_TABLET | SUBLINGUAL | Status: DC | PRN

## 2021-03-31 MED ORDER — ONDANSETRON HCL 4 MG PO TABS: 4.0000 mg | ORAL_TABLET | Freq: Four times a day (QID) | ORAL | Status: DC | PRN

## 2021-03-31 MED ORDER — PRAVASTATIN SODIUM 10 MG PO TABS
20.0000 mg | ORAL_TABLET | Freq: Every evening | ORAL | Status: DC
  Administered 2021-03-31 – 2021-04-02 (×3): 20 mg via ORAL
  Filled 2021-03-31 (×3): qty 2

## 2021-03-31 SURGICAL SUPPLY — 104 items
AGENT HMST KT MTR STRL THRMB (HEMOSTASIS) ×2
ANCH SPNL 25 LMBR MIS (Anchor) ×2 IMPLANT
ANCHOR LUMBAR 25 MIS (Anchor) ×2 IMPLANT
APPLIER CLIP 11 MED OPEN (CLIP) ×4
APR CLP MED 11 20 MLT OPN (CLIP) ×2
BAG COUNTER SPONGE SURGICOUNT (BAG) ×6 IMPLANT
BAG SPNG CNTER NS LX DISP (BAG) ×3
BLADE CLIPPER SURG (BLADE) ×1 IMPLANT
BLADE ILLUMINATOR MIS (MISCELLANEOUS) ×1 IMPLANT
BLADE SURG 10 STRL SS (BLADE) ×4 IMPLANT
BONE MATRIX OSTEOCEL PRO LRG (Bone Implant) ×1 IMPLANT
BUR EGG ELITE 4.0 (BURR) ×2 IMPLANT
CABLE BIPOLOR RESECTION CORD (MISCELLANEOUS) ×2 IMPLANT
CATH FOLEY 2WAY SLVR  5CC 16FR (CATHETERS) ×2
CATH FOLEY 2WAY SLVR 5CC 16FR (CATHETERS) ×2 IMPLANT
CLIP APPLIE 11 MED OPEN (CLIP) ×2 IMPLANT
CLIP LIGATING EXTRA MED SLVR (CLIP) ×2 IMPLANT
CLSR STERI-STRIP ANTIMIC 1/2X4 (GAUZE/BANDAGES/DRESSINGS) ×4 IMPLANT
COVER SURGICAL LIGHT HANDLE (MISCELLANEOUS) ×3 IMPLANT
DISSECTOR BLUNT TIP ENDO 5MM (MISCELLANEOUS) ×1 IMPLANT
DRAIN CHANNEL 15F RND FF W/TCR (WOUND CARE) IMPLANT
DRAPE C-ARM 42X72 X-RAY (DRAPES) ×5 IMPLANT
DRAPE C-ARMOR (DRAPES) ×4 IMPLANT
DRAPE INCISE IOBAN 66X45 STRL (DRAPES) IMPLANT
DRAPE POUCH INSTRU U-SHP 10X18 (DRAPES) ×3 IMPLANT
DRAPE SURG 17X23 STRL (DRAPES) ×2 IMPLANT
DRAPE U-SHAPE 47X51 STRL (DRAPES) ×4 IMPLANT
DRSG OPSITE POSTOP 4X6 (GAUZE/BANDAGES/DRESSINGS) ×3 IMPLANT
DRSG OPSITE POSTOP 4X8 (GAUZE/BANDAGES/DRESSINGS) ×3 IMPLANT
DRSG TEGADERM 4X4.75 (GAUZE/BANDAGES/DRESSINGS) ×1 IMPLANT
DURAPREP 26ML APPLICATOR (WOUND CARE) ×4 IMPLANT
ELECT BLADE 4.0 EZ CLEAN MEGAD (MISCELLANEOUS) ×6
ELECT CAUTERY BLADE 6.4 (BLADE) ×2 IMPLANT
ELECT PENCIL ROCKER SW 15FT (MISCELLANEOUS) ×4 IMPLANT
ELECT REM PT RETURN 9FT ADLT (ELECTROSURGICAL) ×4
ELECTRODE BLDE 4.0 EZ CLN MEGD (MISCELLANEOUS) ×3 IMPLANT
ELECTRODE REM PT RTRN 9FT ADLT (ELECTROSURGICAL) ×2 IMPLANT
GAUZE 4X4 16PLY ~~LOC~~+RFID DBL (SPONGE) IMPLANT
GAUZE SPONGE 4X4 12PLY STRL (GAUZE/BANDAGES/DRESSINGS) ×1 IMPLANT
GLOVE SRG 8 PF TXTR STRL LF DI (GLOVE) ×1 IMPLANT
GLOVE SURG ENC MOIS LTX SZ6.5 (GLOVE) ×4 IMPLANT
GLOVE SURG ENC MOIS LTX SZ7.5 (GLOVE) ×2 IMPLANT
GLOVE SURG MICRO LTX SZ7.5 (GLOVE) ×2 IMPLANT
GLOVE SURG MICRO LTX SZ8.5 (GLOVE) ×6 IMPLANT
GLOVE SURG UNDER POLY LF SZ6.5 (GLOVE) ×4 IMPLANT
GLOVE SURG UNDER POLY LF SZ8 (GLOVE) ×2
GLOVE SURG UNDER POLY LF SZ8.5 (GLOVE) ×6 IMPLANT
GOWN STRL REUS W/ TWL LRG LVL3 (GOWN DISPOSABLE) ×3 IMPLANT
GOWN STRL REUS W/ TWL XL LVL3 (GOWN DISPOSABLE) ×3 IMPLANT
GOWN STRL REUS W/TWL 2XL LVL3 (GOWN DISPOSABLE) ×10 IMPLANT
GOWN STRL REUS W/TWL LRG LVL3 (GOWN DISPOSABLE) ×6
GOWN STRL REUS W/TWL XL LVL3 (GOWN DISPOSABLE) ×6
HEMOSTAT SNOW SURGICEL 2X4 (HEMOSTASIS) IMPLANT
INSERT FOGARTY 61MM (MISCELLANEOUS) IMPLANT
INSERT FOGARTY SM (MISCELLANEOUS) IMPLANT
KIT BASIN OR (CUSTOM PROCEDURE TRAY) ×4 IMPLANT
KIT TURNOVER KIT B (KITS) ×4 IMPLANT
NDL SPNL 18GX3.5 QUINCKE PK (NEEDLE) ×2 IMPLANT
NEEDLE 22X1 1/2 (OR ONLY) (NEEDLE) ×2 IMPLANT
NEEDLE SPNL 18GX3.5 QUINCKE PK (NEEDLE) ×4 IMPLANT
NS IRRIG 1000ML POUR BTL (IV SOLUTION) ×4 IMPLANT
PACK LAMINECTOMY ORTHO (CUSTOM PROCEDURE TRAY) ×4 IMPLANT
PACK UNIVERSAL I (CUSTOM PROCEDURE TRAY) ×4 IMPLANT
PAD ARMBOARD 7.5X6 YLW CONV (MISCELLANEOUS) ×12 IMPLANT
SOL ANTI FOG 6CC (MISCELLANEOUS) IMPLANT
SOLUTION ANTI FOG 6CC (MISCELLANEOUS) ×1
SPACER HEDRON IA 29X39X13 8D (Spacer) ×1 IMPLANT
SPACER RISEL 22X55 8-15MM 6D (Spacer) ×1 IMPLANT
SPONGE INTESTINAL PEANUT (DISPOSABLE) ×6 IMPLANT
SPONGE SURGIFOAM ABS GEL 100 (HEMOSTASIS) ×2 IMPLANT
SPONGE T-LAP 18X18 ~~LOC~~+RFID (SPONGE) ×2 IMPLANT
SPONGE T-LAP 4X18 ~~LOC~~+RFID (SPONGE) ×6 IMPLANT
STAPLER VISISTAT 35W (STAPLE) ×2 IMPLANT
STRIP CLOSURE SKIN 1/2X4 (GAUZE/BANDAGES/DRESSINGS) ×2 IMPLANT
SURGIFLO W/THROMBIN 8M KIT (HEMOSTASIS) ×3 IMPLANT
SUT BONE WAX W31G (SUTURE) ×2 IMPLANT
SUT MNCRL AB 3-0 PS2 27 (SUTURE) ×6 IMPLANT
SUT MON AB 3-0 SH 27 (SUTURE) ×4
SUT MON AB 3-0 SH27 (SUTURE) IMPLANT
SUT PDS AB 1 CTX 36 (SUTURE) ×5 IMPLANT
SUT PROLENE 4 0 RB 1 (SUTURE)
SUT PROLENE 4-0 RB1 .5 CRCL 36 (SUTURE) IMPLANT
SUT PROLENE 5 0 C 1 24 (SUTURE) IMPLANT
SUT PROLENE 5 0 CC1 (SUTURE) IMPLANT
SUT PROLENE 6 0 C 1 30 (SUTURE) ×2 IMPLANT
SUT PROLENE 6 0 CC (SUTURE) IMPLANT
SUT SILK 0 TIES 10X30 (SUTURE) ×2 IMPLANT
SUT SILK 2 0 TIES 10X30 (SUTURE) ×4 IMPLANT
SUT SILK 2 0SH CR/8 30 (SUTURE) IMPLANT
SUT SILK 3 0 TIES 10X30 (SUTURE) ×2 IMPLANT
SUT SILK 3 0 TIES 17X18 (SUTURE) ×2
SUT SILK 3 0SH CR/8 30 (SUTURE) IMPLANT
SUT SILK 3-0 18XBRD TIE BLK (SUTURE) ×1 IMPLANT
SUT VIC AB 1 CT1 18XCR BRD 8 (SUTURE) ×2 IMPLANT
SUT VIC AB 1 CT1 27 (SUTURE) ×4
SUT VIC AB 1 CT1 27XBRD ANBCTR (SUTURE) ×2 IMPLANT
SUT VIC AB 1 CT1 8-18 (SUTURE) ×4
SUT VIC AB 2-0 CT1 18 (SUTURE) ×7 IMPLANT
SYR BULB IRRIG 60ML STRL (SYRINGE) ×4 IMPLANT
TAPE CLOTH 4X10 WHT NS (GAUZE/BANDAGES/DRESSINGS) ×2 IMPLANT
TOWEL GREEN STERILE (TOWEL DISPOSABLE) ×6 IMPLANT
TOWEL GREEN STERILE FF (TOWEL DISPOSABLE) ×4 IMPLANT
TRAP SPECIMEN MUCUS 40CC (MISCELLANEOUS) ×2 IMPLANT
WATER STERILE IRR 1000ML POUR (IV SOLUTION) ×4 IMPLANT

## 2021-03-31 NOTE — Anesthesia Preprocedure Evaluation (Addendum)
Anesthesia Evaluation  Patient identified by MRN, date of birth, ID band Patient awake    Reviewed: Allergy & Precautions, NPO status , Patient's Chart, lab work & pertinent test results, reviewed documented beta blocker date and time   History of Anesthesia Complications (+) PROLONGED EMERGENCE and history of anesthetic complications  Airway Mallampati: II  TM Distance: >3 FB Neck ROM: Limited    Dental  (+) Teeth Intact, Dental Advisory Given   Pulmonary neg pulmonary ROS, former smoker,    Pulmonary exam normal        Cardiovascular hypertension, Pt. on medications and Pt. on home beta blockers + angina + CAD, + Cardiac Stents, + CABG (10/18 X 2) and + Peripheral Vascular Disease   Rhythm:Regular Rate:Normal  Echo TTE 02/12/2021: 1. Left ventricular ejection fraction, by estimation, is 55 to 60%. Left ventricular ejection fraction by 3D volume is 58 %. The left ventricle has normal function. The left ventricle has no regional wall motion abnormalities. Left ventricular diastolicparameters were normal. The average left ventricular global longitudinal strain is -24.5 %. The global longitudinal strain is normal.  2. Right ventricular systolic function is normal. The right ventricular size is normal. There is normal pulmonary artery systolic pressure. The estimated right ventricular systolic pressure is 69.6 mmHg.  3. Left atrial size was mildly dilated.  4. The mitral valve is normal in structure. Trivial mitral valve regurgitation. No evidence of mitral stenosis.  5. The aortic valve is tricuspid. There is mild calcification of the aortic valve. Aortic valve regurgitation is trivial. Mild aortic valve sclerosis is present, with no evidence of aortic valve stenosis.  6. The inferior vena cava is normal in size with greater than 50% respiratory variability, suggesting right atrial pressure of 3 mmHg.    Neuro/Psych  Neuromuscular  disease negative psych ROS   GI/Hepatic Neg liver ROS, GERD  ,  Endo/Other  negative endocrine ROS  Renal/GU Renal InsufficiencyRenal disease  negative genitourinary   Musculoskeletal  (+) Arthritis ,   Abdominal (+)  Abdomen: soft.    Peds  Hematology negative hematology ROS (+)   Anesthesia Other Findings   Reproductive/Obstetrics                           Anesthesia Physical  Anesthesia Plan  ASA: 3  Anesthesia Plan: General   Post-op Pain Management: GA combined w/ Regional for post-op pain   Induction: Intravenous  PONV Risk Score and Plan: 4 or greater and Ondansetron, Dexamethasone, Treatment may vary due to age or medical condition and Midazolam  Airway Management Planned: Oral ETT and Mask  Additional Equipment: None  Intra-op Plan:   Post-operative Plan: Extubation in OR  Informed Consent: I have reviewed the patients History and Physical, chart, labs and discussed the procedure including the risks, benefits and alternatives for the proposed anesthesia with the patient or authorized representative who has indicated his/her understanding and acceptance.     Dental advisory given  Plan Discussed with: CRNA  Anesthesia Plan Comments: (Lab Results      Component                Value               Date                      WBC  6.4                 03/29/2021                HGB                      13.8                03/29/2021                HCT                      41.8                03/29/2021                MCV                      89.9                03/29/2021                PLT                      319                 03/29/2021           Lab Results      Component                Value               Date                      NA                       138                 03/29/2021                K                        3.7                 03/29/2021                CO2                      28                   03/29/2021                GLUCOSE                  103 (H)             03/29/2021                BUN                      23                  03/29/2021                CREATININE               1.52 (H)  03/29/2021                CALCIUM                  8.9                 03/29/2021                EGFR                     44 (L)              01/28/2021                GFRNONAA                 50 (L)              03/29/2021           PAT note by Karoline Caldwell, PA-C: Follows with cardiology for history of HTN, HLD, sinus bradycardia, CAD s/p PCI to LAD and LCx with subsequent CABG in 5465 (complicated by postop A. fib), PAD (s/p Fem-Post Tib bypass 2016, R Fem-Pop 2016, bilat CIA stents in 2016).  Cardiac clearance per telephone encounter 02/16/2021, "Chart reviewed as part of pre-operative protocol coverage.Patient was recently seen by Richardson Dopp PA-C for pre-op clearance. He underwent nuclear stress testing and echocardiogram. On nuclear stress test, Scott commented, "Stress test is okay. His old heart attack is demonstrated but there is no ischemia." On echocardiogram, Scott commented, "Echocardiogram shows normal ejection fraction and no significant valve disease. - Continue current medications/treatment plan and follow up as scheduled. - Pt may proceed with surgery at acceptable risk. - If needed ASA can be held for up to 7 days prior to surgery and resumed post op as soon as it is felt to be safe.""  CKD 3 followed by Dr. Justin Mend.  Preop labs reviewed, creatinine elevated 1.52 consistent with history of CKD, otherwise unremarkable.  EKG 01/26/2021: Sinus bradycardia.  Rate 53.  TTE 02/12/2021: 1. Left ventricular ejection fraction, by estimation, is 55 to 60%. Left  ventricular ejection fraction by 3D volume is 58 %. The left ventricle has  normal function. The left ventricle has no regional wall motion  abnormalities. Left ventricular diastolic  parameters were normal.  The average left ventricular global longitudinal  strain is -24.5 %. The global longitudinal strain is normal.  2. Right ventricular systolic function is normal. The right ventricular  size is normal. There is normal pulmonary artery systolic pressure. The  estimated right ventricular systolic pressure is 03.5 mmHg.  3. Left atrial size was mildly dilated.  4. The mitral valve is normal in structure. Trivial mitral valve  regurgitation. No evidence of mitral stenosis.  5. The aortic valve is tricuspid. There is mild calcification of the  aortic valve. Aortic valve regurgitation is trivial. Mild aortic valve  sclerosis is present, with no evidence of aortic valve stenosis.  6. The inferior vena cava is normal in size with greater than 50%  respiratory variability, suggesting right atrial pressure of 3 mmHg.   Comparison(s): No significant change from prior study. Study 03/02/2017 was  TEE, no prior TTE available.   Conclusion(s)/Recommendation(s): Otherwise normal echocardiogram, with  minor abnormalities described in the report.   Nuclear stress 01/28/2021: . Findings are consistent with prior myocardial infarction. The study is low risk. . No ST deviation was noted. . LV perfusion is abnormal. There is  no evidence of ischemia. There is evidence of infarction. Defect 1: There is a medium defect with mild reduction in uptake present in the apical to basal inferior location(s) that is fixed. There is normal wall motion in the defect area. Consistent with infarction. . Left ventricular function is normal. Nuclear stress EF: 61 %. The left ventricular ejection fraction is normal (55-65%). End diastolic cavity size is normal. . Prior study available for comparison from 03/06/2015. No changes compared to prior study. )       Anesthesia Quick Evaluation

## 2021-03-31 NOTE — Op Note (Signed)
Date: March 31, 2021  Preoperative diagnosis: Chronic lower back pain with radiculopathy  Postoperative diagnosis: Same  Procedure: 1.  Anterior spine exposure at the L5-S1 disc space via anterior retroperitoneal approach for L5-S1 ALIF 2.  Oblique spine exposure at L4-L5 disc base via oblique retroperitoneal approach for L4-L5 OLIF  Surgeon: Dr. Cephus Shelling, MD  Co-surgeon: Dr. Venita Lick, MD  Assistant: Voncille Lo, PA  Indications: Patient is a 65 year old male with chronic lower back pain with right lower extremity radiculopathy that vascular surgery was asked to evaluate for two-level abdominal exposure at L4-L5 and L5-S1 for two-level ALIF.  Ultimately after review of his imaging including CT and MRI we felt an anterior approach at L5-S1 was appropriate but due to significant calcification in the abdominal aorta and iliac vessels we selected a oblique approach at L4-L5 after further discussion with Dr Shon Baton.  He presents today after risk benefits discussed.  Findings: Initially exposed the L5-S1 disc space through a lower abdominal transverse incision where this disc level was marked prior to incision.  The rectus muscle was mobilized to the midline and then we entered the retroperitoneum and mobilized peritoneum and left ureter across midline and the middle sacral vessels were divided between clips and we got good working room on both sides of the disc space and a fixed NuVasive retractor was placed.  We confirmed with a spinal needle that were at L5-S1 with lateral fluoroscopy.  After the L5-S1 ALIF was complete and incision closed the patient was rolled right lateral and oblique approach through left oblique muscles into the retroperitoneum was performed to expose L4-L5 and Globus OLIF retractor was used and confirmed we were at the correct level with a spinal needle in the disc space.    Anesthesia: General  Details: Patient was taken to the operating room after  informed consent was obtained.  Placed on the operative table in supine position.  General endotracheal anesthesia was induced.  Initially fluoroscopic C-arm was brought in the lateral position and the L5-S1 disc space was marked over the left rectus muscle.  The abdominal wall was then prepped and draped in usual sterile fashion.  Antibiotics were given.  Timeout was performed.  Transverse incision was made at our preoperative mark and dissected down with Bovie cautery and used cerebellar retractors for added visualization.  The anterior rectus sheath was opened transversely and the left rectus muscle was mobilized to the midline after raising flaps under the anterior rectus sheath.  We then entered the retroperitoneum lateral to the muscle and the peritoneum and left ureter bluntly mobilized to the midline.  My assistant used hand-held Wiley retractors to pull the peritoneum and left ureter across the disc space while a lap pad and Balfour retractor was placed in the wound for added exposure.  I continued to bluntly mobilize with suction and Kd to get good working exposure at the L5-S1 disc space from an anterior exposure.  The middle sacral vessels were identified and these were divided between clips.  I then bluntly dissected on both sides of the disc space mobilizing the left iliac vein and then we placed a fixed NuVasive retractor with 160 lip retractors on each side of the disc space and 140 lip retractor cranial.  Then placed a spinal needle in the disc space and confirmed on lateral fluoroscopy we were at the correct level.  Please see Dr. Shon Baton dictation for remainder of the case.  After Dr. Shon Baton closed the L5-S1 exposure incision, I  was called back into the room and the patient was rolled into the right lateral position and all pressure points padded.  Fluoroscopic C-arm was brought in the lateral position and we marked the L4-L5 disc space over the left oblique muscles about two fingerbreadth's off  the iliac crest..  The abdomen and left flank was then prepped and draped in standard sterile fashion.  Another timeout was performed.  Initially made a oblique incision over the left oblique muscles two fingerbreadths off the iliac crest.  I opened the external oblique fascia with cautery and did a muscle-sparing technique between the layers of oblique muscles and entered the retroperitoneum and then bluntly swept the peritoneum down off the left psoas including the ureter.  The L4-L5 disc space was then visualized.  We used hand-held Wiley retractors for added visualization and then I bluntly mobilize the left psoas muscle lateral to the disc space with blunt dissection.  Ultimately I placed a 160 reverse lip retractor to the right of the disc space to keep the vessels out of the way with the globus retractor designed for OLIF.  I then placed two 160 reverse lip retractors lateral to the disc space to keep the psoas muscle retracted lateral and away from the disc space so we had an oblique exposure.  I did have one segmental branch that crossed the disc space that was ligated between clips and divided and then bluntly mobilized.  We had good working room.  I then placed a needle in the disc space and we confirmed on lateral fluoroscopy we were at the correct level at L4-L5.  Case was turned over to Dr. Shon Baton.  Please see his dictation for the remainder of the case.  Complication: None  Condition: Stable  Cephus Shelling, MD [facility] Office: [phone]   Cephus Shelling

## 2021-03-31 NOTE — H&P (Signed)
History and Physical Interval Note:  03/31/2021 8:22 AM  Derrick [name]  has presented today for surgery, with the diagnosis of Degenerative disc disease with radiculopathy L4-S1.  The various methods of treatment have been discussed with the patient and family. After consideration of risks, benefits and other options for treatment, the patient has consented to  Procedure(s) with comments: ANTERIOR LUMBAR FUSION 1 LEVEL L5 TO S1 (N/A) - 3 C-BED  LEFT TAP BLOCK WITH EXPAREL OBLIQUE LUMBAR INTERBODY FUSION 1 LEVEL L4 TO L5 (N/A) HARVEST ILIAC BONE GRAFT (N/A) ABDOMINAL EXPOSURE (N/A) as a surgical intervention.  The patient's history has been reviewed, patient examined, no change in status, stable for surgery.  I have reviewed the patient's chart and labs.  Questions were answered to the patient's satisfaction.    L5-S1 ALIF and L4-L5 OLIF given iliac and aortic calcification.  Risk and benefits again discussed.  Cephus Shelling  Patient name: Derrick [name]       MRN: [id number]        DOB: March 19, 1956            Sex: male   REASON FOR CONSULT: Evaluate for two-level ALIF at L4-L5 and L5-S1   HPI: YERICK EGGEBRECHT is a 65 y.o. male, with history of hypertension, hyperlipidemia, coronary artery disease status post CABG, peripheral vascular disease that presents for evaluation of two-level ALIF at L4-L5 and L5-S1.  Patient has had chronic lower back pain with right lower extremity radiculopathy for years.  He has failed conservative management including injections and physical therapy.  Has been evaluated by Dr. Shon Baton who has asked vascular surgery to evaluate for two-level abdominal exposure at L4-L5 and L5-S1 for ALIF.  He is well-known to our practice and previously had a right common femoral to below-knee popliteal bypass as well as a left common femoral to PT bypass by Dr. Darrick Penna in 2016.  He denies any history of abdominal surgery.  He does note a history of right inguinal hernia that  has not been repaired.       Past Medical History:  Diagnosis Date   Chronic kidney disease      "mild"-from taking meloxicam for many years.   Complication of anesthesia      "I was slow to wake up"   Coronary artery disease 2006    stents x2=LAD LCX; totally occluded RCA b. CABG 03/02/17 x2 (left internal mammary artery to LAD, Y-graft right mammary from left mammary to OM).// Myoview 9/22: Inferior infarct, no ischemia, EF 61; low risk // Echocardiogram 9/22: EF 55-60, no RWMA, GLS -24.5%, normal RVSF, RVSP 30.7, mild LAE, trivial MR, trivial AI, mild AV sclerosis w/o AS   Dyslipidemia     GERD (gastroesophageal reflux disease)     History of bronchitis     Hyperlipidemia      statin intolerant   Hyperparathyroidism     Hypertension     Insomnia     Metatarsalgia     Neuropathy     OA (osteoarthritis)     Peripheral vascular disease (HCC)     Seasonal allergies     Urinary frequency     Wears glasses             Past Surgical History:  Procedure Laterality Date   CARDIAC CATHETERIZATION   10/28/2004    circ and mid LAD chyper stents   CERVICAL FUSION   2007,2009   CERVICAL SPINE SURGERY   2004   COLONOSCOPY  CORONARY ANGIOPLASTY   2006    stents placed    CORONARY ARTERY BYPASS GRAFT N/A 03/02/2017    Procedure: CORONARY ARTERY BYPASS GRAFTING (CABG), ON PUMP, TIMES TWO, USING BILATERAL MAMMARY ARTERIES;  Surgeon: Loreli Slot, MD;  Location: [facility] OR;  Service: Open Heart Surgery;  Laterality: N/A;   ELBOW ARTHROPLASTY   1994    right   ENDARTERECTOMY FEMORAL Left 03/17/2015    Procedure: ENDARTERECTOMY COMMON FEMORAL WITH PROFUNDOPLASTY;  Surgeon: Sherren Kerns, MD;  Location: [facility] OR;  Service: Vascular;  Laterality: Left;   FEMORAL-POPLITEAL BYPASS GRAFT Left 03/17/2015    Procedure: BYPASS GRAFT FEMORAL-POPLITEAL ARTERY-LEFT LEG AND POSTERIOR TIBIAL SEQUENTIAL GRAFT TO BELOW KNEE POPLITEAL ARTERY;  Surgeon: Sherren Kerns, MD;  Location: [facility] OR;  Service:  Vascular;  Laterality: Left;   FEMORAL-POPLITEAL BYPASS GRAFT Right 05/11/2015    Procedure:  RIGHT FEMORAL-BELOW THE KNEE POPLITEAL ARTERY BYPASS GRAFT USING NON REVERSE RIGHT GREATER SAPHENOUS VEIN;  Surgeon: Sherren Kerns, MD;  Location: [facility] OR;  Service: Vascular;  Laterality: Right;   INCISION AND DRAINAGE ABSCESS POSTERIOR CERVICALSPINE   2009   INTRAOPERATIVE ARTERIOGRAM Left 03/17/2015    Procedure: INTRA OPERATIVE ARTERIOGRAM X2;  Surgeon: Sherren Kerns, MD;  Location: [facility] OR;  Service: Vascular;  Laterality: Left;   INTRAOPERATIVE ARTERIOGRAM Right 05/11/2015    Procedure: INTRA OPERATIVE ARTERIOGRAM;  Surgeon: Sherren Kerns, MD;  Location: [facility] OR;  Service: Vascular;  Laterality: Right;   LEFT HEART CATH AND CORONARY ANGIOGRAPHY N/A 03/01/2017    Procedure: LEFT HEART CATH AND CORONARY ANGIOGRAPHY;  Surgeon: Kathleene Hazel, MD;  Location: [facility] INVASIVE CV LAB;  Service: Cardiovascular;  Laterality: N/A;   PATCH ANGIOPLASTY Left 03/17/2015    Procedure: VEIN PATCH ANGIOPLASTY COMMON FEMORAL ARTERY;  Surgeon: Sherren Kerns, MD;  Location: [facility] OR;  Service: Vascular;  Laterality: Left;   PERIPHERAL VASCULAR CATHETERIZATION N/A 01/16/2015    Procedure: Abdominal Aortogram;  Surgeon: Sherren Kerns, MD;  Location: [facility] INVASIVE CV LAB;  Service: Cardiovascular;  Laterality: N/A;   SHOULDER OPEN ROTATOR CUFF REPAIR Right 04/23/2013    Procedure: RIGHT TWO TENDON ROTATOR CUFF REPAIR  SUBACROMIAL DECOMPRESSION AND OPEN DISTAL CLAVICLE RESECTION;  Surgeon: Wyn Forster., MD;  Location: [facility];  Service: Orthopedics;  Laterality: Right;   stents placed    2006   TEE WITHOUT CARDIOVERSION N/A 03/02/2017    Procedure: TRANSESOPHAGEAL ECHOCARDIOGRAM (TEE);  Surgeon: Loreli Slot, MD;  Location: [facility] OR;  Service: Open Heart Surgery;  Laterality: N/A;   VEIN HARVEST Right 05/11/2015    Procedure: WITH NON REVERSE RIGHT GREATER SAPHENOUS VEIN HARVEST;   Surgeon: Sherren Kerns, MD;  Location: [facility] OR;  Service: Vascular;  Laterality: Right;           Family History  Problem Relation Age of Onset   Arthritis Mother     Varicose Veins Mother     Lung cancer Father     COPD Father          Lung   Cancer Brother          Lukemia   COPD Brother     Cancer Brother          Prostate      SOCIAL HISTORY: Social History         Socioeconomic History   Marital status: Married      Spouse name: Not on file   Number of children: 1  Years of education: 74   Highest education level: High school graduate  Occupational History   Occupation: Retired  Tobacco Use   Smoking status: Former      Types: Cigarettes      Quit date: 05/30/1989      Years since quitting: 31.8   Smokeless tobacco: Never  Vaping Use   Vaping Use: Never used  Substance and Sexual Activity   Alcohol use: Yes      Comment: 1 beer every 6 months   Drug use: Never   Sexual activity: Not on file  Other Topics Concern   Not on file  Social History Narrative    Lives with his wife.    One stepson.    Left-handed.    One cup caffeine per week.     Social Determinants of Health    Financial Resource Strain: Not on file  Food Insecurity: Not on file  Transportation Needs: Not on file  Physical Activity: Not on file  Stress: Not on file  Social Connections: Not on file  Intimate Partner Violence: Not on file           Allergies  Allergen Reactions   Codeine Itching and Other (See Comments)   Atorvastatin Other (See Comments)      Muscle aches Muscle aches   Crestor [Rosuvastatin Calcium]        Muscle aches   Meloxicam Other (See Comments)      Can not take due to kidneys   Statins Other (See Comments)      myalgia            Current Outpatient Medications  Medication Sig Dispense Refill   acetaminophen (TYLENOL) 650 MG CR tablet Take 1,300 mg by mouth in the morning and at bedtime.       amLODipine (NORVASC) 5 MG tablet Take 5 mg by mouth  daily.       ASPIRIN 81 PO Take 81 mg by mouth daily.       calcitRIOL (ROCALTROL) 0.5 MCG capsule Take 0.5 mcg by mouth daily.       calcium carbonate (OSCAL) 1500 (600 Ca) MG TABS tablet Take 600 mg of elemental calcium by mouth 2 (two) times daily with a meal.       ezetimibe (ZETIA) 10 MG tablet Take 10 mg by mouth every evening.       famotidine (PEPCID) 20 MG tablet Take 20 mg by mouth 2 (two) times daily.       fenofibrate 160 MG tablet Take 160 mg by mouth every evening.       gabapentin (NEURONTIN) 300 MG capsule Take 600 mg by mouth at bedtime.       hydrochlorothiazide (HYDRODIURIL) 25 MG tablet Take 25 mg by mouth every evening.       inclisiran (LEQVIO) 284 MG/1.5ML SOSY injection Inject 284 mg into the skin See admin instructions. Inject 1.94mL SQ every 3 months       ipratropium (ATROVENT) 0.03 % nasal spray Place 1 spray into both nostrils 2 (two) times daily as needed for rhinitis.       losartan (COZAAR) 100 MG tablet Take 100 mg by mouth every evening.        Magnesium 250 MG TABS Take 250 mg by mouth every evening.       MAGNESIUM PO Take 250 mg by mouth daily.       metoprolol tartrate (LOPRESSOR) 25 MG tablet Take 0.5 tablets (12.5 mg total) by mouth  2 (two) times daily. 90 tablet 3   nitroGLYCERIN (NITROSTAT) 0.4 MG SL tablet Place 1 tablet (0.4 mg total) under the tongue every 5 (five) minutes as needed for chest pain. 75 tablet 1   Omega-3 Fatty Acids (OMEGA-3 EPA FISH OIL PO) Take 2 tablets by mouth 2 (two) times daily.        potassium chloride (KLOR-CON) 10 MEQ tablet Take 1 tablet (10 mEq total) by mouth daily. 90 tablet 3   pravastatin (PRAVACHOL) 20 MG tablet Take 20 mg by mouth every evening.       Propylene Glycol (SYSTANE BALANCE) 0.6 % SOLN Place 1 drop into both eyes daily as needed (dry eyes).       tamsulosin (FLOMAX) 0.4 MG CAPS capsule Take 0.4 mg by mouth every evening.       traZODone (DESYREL) 50 MG tablet Take 50 mg by mouth at bedtime.       vitamin  B-12 (CYANOCOBALAMIN) 500 MCG tablet Take 500 mcg by mouth daily.        No current facility-administered medications for this visit.      REVIEW OF SYSTEMS:  [X]  denotes positive finding, [ ]  denotes negative finding Cardiac   Comments:  Chest pain or chest pressure:      Shortness of breath upon exertion:      Short of breath when lying flat:      Irregular heart rhythm:             Vascular      Pain in calf, thigh, or hip brought on by ambulation:      Pain in feet at night that wakes you up from your sleep:       Blood clot in your veins:      Leg swelling:              Pulmonary      Oxygen at home:      Productive cough:       Wheezing:              Neurologic      Sudden weakness in arms or legs:       Sudden numbness in arms or legs:       Sudden onset of difficulty speaking or slurred speech:      Temporary loss of vision in one eye:       Problems with dizziness:              Gastrointestinal      Blood in stool:       Vomited blood:              Genitourinary      Burning when urinating:       Blood in urine:             Psychiatric      Major depression:              Hematologic      Bleeding problems:      Problems with blood clotting too easily:             Skin      Rashes or ulcers:             Constitutional      Fever or chills:          PHYSICAL EXAM:    Vitals:    03/30/21 0805  BP: 138/77  Pulse: 66  Temp: 98.3  F (36.8 C)  TempSrc: Skin  Weight: 216 lb (98 kg)  Height: 6\' 2"  (1.88 m)      GENERAL: The patient is a well-nourished male, in no acute distress. The vital signs are documented above. CARDIAC: There is a regular rate and rhythm.  VASCULAR:  Palpable femoral pulses bilaterally Palpable DP pulses bilaterally PULMONARY: No respiratory distress. ABDOMEN: Soft and non-tender. MUSCULOSKELETAL: There are no major deformities or cyanosis. NEUROLOGIC: No focal weakness or paresthesias are detected. SKIN: There are no  ulcers or rashes noted. PSYCHIATRIC: The patient has a normal affect.   DATA:    CT abdomen pelvis reviewed from 11/27/2019 and he has a markedly circumferentially calcified infrarenal aorta and bilateral iliac arteries with iliac stents.   Assessment/Plan:   65 year old male with chronic lower back pain and right lower extremity radiculopathy that presents for evaluation of two-level ALIF at L4-L5 and L5-S1.  I have reviewed his CT scan from last year and he has a circumferentially calcified infrarenal aorta and bilateral iliac arteries with existing iliac stents.  I discussed with Dr. Shon Baton that I do not think he is a candidate for anterior approach at L4-L5 given the marked calcification and risk for vessel injury.  Instead we have offered anterior approach at L5-S1 through a lower transverse incision and then rolling him on his side and doing an oblique exposure at L4-L5 which I think will be much safer.  The oblique approach will be much more about moving the psoas muscle and less about moving his calcified vessels.  I reviewed doing this through two separate incisions with the patient and discussed we would be mobilizing peritoneum and intestines as well as the left ureter and iliac artery and vein and risk of injury to the above structures.  He wishes to proceed.  All questions answered.       Cephus Shelling, MD [facility] Office: [phone]

## 2021-03-31 NOTE — Brief Op Note (Signed)
03/31/2021  1:09 PM  PATIENT:  Derrick [name]  65 y.o. male  PRE-OPERATIVE DIAGNOSIS:  Degenerative disc disease with radiculopathy L4-S1  POST-OPERATIVE DIAGNOSIS:  Degenerative disc disease with radiculopathy L4-S1  PROCEDURE:  Procedure(s) with comments: ANTERIOR LUMBAR FUSION 1 LEVEL LUMBAR FIVE TO SACRAL ONE (N/A) - 3 C-BED  LEFT TAP BLOCK WITH EXPAREL OBLIQUE LUMBAR INTERBODY FUSION 1 LEVEL LUMBAR FOUR  TO LUMBAR FIVE (N/A) ABDOMINAL EXPOSURE (N/A)  SURGEON:  Surgeon(s) and Role: Panel 1:    Venita Lick, MD - Primary Panel 2:    Cephus Shelling, MD - Primary  PHYSICIAN ASSISTANT:   ASSISTANTS: Voncille Lo, PA  ANESTHESIA:   general  EBL:  100 mL   BLOOD ADMINISTERED:none  DRAINS: none   LOCAL MEDICATIONS USED:  NONE  SPECIMEN:  No Specimen  DISPOSITION OF SPECIMEN:  N/A  COUNTS:  YES  TOURNIQUET:  * No tourniquets in log *  DICTATION: .Dragon Dictation  PLAN OF CARE: Admit to inpatient   PATIENT DISPOSITION:  PACU - hemodynamically stable.

## 2021-03-31 NOTE — Op Note (Signed)
OPERATIVE REPORT  DATE OF SURGERY: 03/31/2021  PATIENT NAME:  Derrick [name] MRN: [id number] DOB: [birth year]  PCP: Soundra Pilon, [name]  PRE-OPERATIVE DIAGNOSIS: Degenerative lumbar disc disease L4-5 and L5-S1 with radicular right leg pain  POST-OPERATIVE DIAGNOSIS: Same  PROCEDURE:   Anterior lumbar interbody fusion L5-S1 Oblique lumbar interbody fusion L4-5  SURGEON:  Venita Lick, MD  Approach surgeon: Dr. Sherald Hess  PHYSICIAN ASSISTANT: Voncille Lo, PA  ANESTHESIA:   General  EBL: 100 ml   Complications: None  Implants: L5-S1:  Hedron large lumbar cage.  13 mm height 8 degree lordosis      L4-5: Rise L cage: 22 mm x 55 mm x degree lordosis.  8-15 mm cage expanded to 9.5 mm height.  Graft: Osteocell   BRIEF HISTORY: [name] is a 65 y.o. male who has had significant progressive back buttock and neuropathic left leg pain for some time now.  Patient has significant degenerative disc disease with foraminal stenosis resulting in the neurogenic claudication/radicular pain.  Attempts at conservative management had failed to alleviate his pain and improve his quality of life.  As result we elected to move forward with surgery.  Initially was felt as though we could accomplish the fusion as a two-level anterior lumbar body fusion.  The patient was evaluated by vascular surgeon and noted to have stents at the L4-5 level well as extensive calcifications of the vessels that would be prohibitive to significant retraction.  As result of the potential risk for increased morbidity/mortality with an ALIF approach at L4-5 we elected to move forward with the oblique lumbar interbody fusion.  This would provide similar exposure but with less need for retraction.  The appropriate risks, benefits, and alternatives to surgery were discussed with the patient and consent was obtained.  PROCEDURE DETAILS: Patient was brought into the operating room and was properly positioned on the  operating room table.  After induction with general anesthesia the patient was endotracheally intubated.  A timeout was taken to confirm all important data: including patient, procedure, and the level. Teds, SCD's, and a Foley were applied.   The patient was placed supine on the [place] table and the abdomen was prepped and draped in a standard fashion.  At this point we marked out our incision site for the anterior lumbar interbody fusion.  Dr. Chestine Spore then performed a standard anterior retroperitoneal approach.  Please refer to his dictation for specifics on the approach.  The [employer] anterior lumbar retracting system was set up and the retractor blades were placed.  A needle was placed into the L5-S1 disc space and I confirmed that we are at the appropriate level.  At this point Dr. Chestine Spore scrubbed out and I continued with the surgery.    An annulotomy was performed with a 10 blade scalpel and then I used curettes and Kerrison rongeurs to remove the bulk of the disc material.  I then placed a interlaminar distraction device to improve visualization posteriorly.  Using fine curved curette I was able to release the posterior annulus from behind the body of S1.  I then used my 2 and 3 mm Kerrison rongeur to trim down the posterior osteophyte from L5 and S1.  I continued removing all the cartilaginous endplate to have bleeding subchondral bone.  I was able to freely pass my nerve hook behind the body of S1 confirming he had an adequate decompression.  Using live fluoroscopy I demonstrated parallel endplate distraction with a lamina spreader.  At this point I was quite pleased with the overall decompression.  Using the rasp trials I elected to use the 13 mm large 8 degree lordotic cage.  This provided the best overall restoration of disc space height and indirect foraminal decompression.  The implant was obtained and packed with the allograft.  I then malleted it to the appropriate depth.  A single locking blade was  placed through the cage into the L5 vertebral body and into the S1 vertebral body to aid in stabilizing the device.  At this point x-rays demonstrate satisfactory overall position of the intervertebral cage.  The wound was copiously irrigated with normal saline.  I then sequentially removed the retractors confirming that hemostasis.  I then closed the fascia of the rectus with a running #1 PDS suture.  Superficial was closed with 2-0 Vicryl suture and the skin with 3-0 Monocryl.  X-rays were taken at this time and read by the radiologist and confirmed there is no retained vertical instruments in the wound.  At this point with the anterior approach complete drapes were removed and the patient was prepared to be repositioned for the oblique approach.  The patient was turned left side up and an axillary roll was placed.  All bony prominences were well-padded.  The arm was placed in a well arm holder and the patient was secured to the table with tape to prevent motion.  I confirmed satisfactory positioning of the L4-5 disc space in both the AP and lateral planes.  I also confirmed that the L5-S1 cage was properly positioned and moved during the relocation of the patient.  At this point the flank and anterior abdomen were prepped and draped in a standard fashion.  Another timeout was taken to confirm this portion of the procedure which was the OLIF at L4-5 with Dr. Chestine Spore performing the approach.  Please refer to Dr. Ophelia Charter dictation for specifics on the oblique approach at L4-5.  He did place the appropriate retractors to visualize the L4-5 disc space.  A needle was placed into the disc and the x-rays confirmed with the appropriate level.  An annulotomy was then performed with a 10 blade scalpel.  Bulk of the disc material was removed with a pituitary rondure.  I then used a Cobb elevator to remove the cartilaginous endplate from the L4 and L5 endplates.  I then used the oyster shell elevator to release the  contralateral annulus.  At this point I used curettes and pituitary rongeurs to remove the remaining portion of the disc material.  I then used a 2 mm Kerrison rongeur to begin working posteriorly along the posterior annulus.  I removed the bulk of the posterior osteophyte from the L 4 and 5 vertebral bodies.  I was able to pass my nerve hook behind the body of L5 confirming adequate annular release.  I then placed a lamina spreader in the disc space and under live fluoroscopy confirmed that had parallel endplate distraction.  Prior to the procedure his disc space was only approximately 3 mm in height.  I was able to place the 6 mm  trial without great difficulty.  Is able to see the foraminal volume improved as I placed the trial device.  At this point I was pleased with the restoration of disc space height and the indirect decompression that I was able to achieve.  I elected to use the 22 mm 6 degree lordotic cage.  The cage was obtained and packed with the allograft bone.  I then made sure had bleeding subchondral bone but scraping the endplates and then I removed the posterior disc material.  I then inserted the cage using fluoroscopy to guide positioning.  Cage came to rest along the posterior margin of the L4 vertebral body and was well seated in the disc space.  I distracted the device from the 8 mm height to approximately 9.5 mm.  I had excellent contact in both the AP and lateral planes with the endplates and restoration of the disc space height.  I then removed the inserting device and then filled additional bone graft into the cage.  I also put some bone graft along the anterior aspect of the cage to facilitate a sentinel fusion.  At this point I irrigated the wound copiously normal saline and made sure that hemostasis.  I then remove the retractors and took my final x-rays confirming satisfactory positioning of both cages in both the AP and lateral planes.  The fascia of the external oblique was then  closed with a running #1 PDS suture, then a layer of 2-0 Vicryl suture, and finally 3-0 Monocryl.  Final x-rays were taken and read by the radiologist prior to the Monocryl closure confirming no retained surgical instruments.  Steri-Strips and dressings were applied patient was extubated transfer the PACU without incident.  The end of the case all needle sponge counts were correct.  First Assistant Voncille Lo PA: She was instrumental in assisting with positioning of the patient for both the anterior and the oblique procedure.  She also provided invaluable retraction, visualization, and wound closure.    Venita Lick, MD 03/31/2021 12:50 PM

## 2021-03-31 NOTE — OR Nursing (Signed)
No retained products seen on imaging per Dr. Loni Dolly (radiology). MD aware.

## 2021-03-31 NOTE — H&P (Signed)
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  There has been no change in his clinical exam since his last office visit of 03/22/2021.  Patient's original surgical plan was a two-level anterior lumbar interbody fusion with posterior supplemental fixation.  After evaluation by the approach surgeon Dr. Sherald Hess there was concern raised about mobilizing the iliac vessels at the L4-5 level.  As result we elected to alter our surgical plan to an oblique lumbar interbody fusion at the L4-5 level to minimize mobilization requirements of the vessels.  We will therefore move forward with an L5-S1 anterior lumbar body fusion and an oblique lumbar interbody fusion at L4-5.  We will still plan on supplemental posterior fixation and possible decompression if additional neuropathic pain persists.  Gone over this with the patient in great detail including the risks benefits and alternatives of the anterior and oblique approaches.  All of his questions were encouraged and addressed.

## 2021-03-31 NOTE — Anesthesia Procedure Notes (Signed)
Anesthesia Regional Block: TAP block   Pre-Anesthetic Checklist: , timeout performed,  Correct Patient, Correct Site, Correct Laterality,  Correct Procedure, Correct Position, site marked,  Risks and benefits discussed,  Surgical consent,  Pre-op evaluation,  At surgeon's request and post-op pain management  Laterality: Left  Prep: chloraprep       Needles:  Injection technique: Single-shot  Needle Type: Echogenic Stimulator Needle      Needle Gauge: 21     Additional Needles:   Procedures:,,,, ultrasound used (permanent image in chart),,    Narrative:  Start time: 03/31/2021 7:37 AM End time: 03/31/2021 7:57 AM Injection made incrementally with aspirations every 5 mL.  Performed by: Personally  Anesthesiologist: [name], MD  Additional Notes: BP cuff, SpO2 and EKG monitors applied. Sedation begun.  Anesthetic injected incrementally, slowly, and after neg aspirations under direct ultrasound guidance. Good fascial spread noted. Patient tolerated well.

## 2021-03-31 NOTE — Transfer of Care (Signed)
Immediate Anesthesia Transfer of Care Note  Patient: Derrick [name]  Procedure(s) Performed: ANTERIOR LUMBAR FUSION 1 LEVEL LUMBAR FIVE TO SACRAL ONE OBLIQUE LUMBAR INTERBODY FUSION 1 LEVEL LUMBAR FOUR  TO LUMBAR FIVE ABDOMINAL EXPOSURE  Patient Location: PACU  Anesthesia Type:General  Level of Consciousness: drowsy, patient cooperative and responds to stimulation  Airway & Oxygen Therapy: Patient Spontanous Breathing and Patient connected to face mask oxygen  Post-op Assessment: Report given to RN, Post -op Vital signs reviewed and stable and Patient moving all extremities X 4  Post vital signs: Reviewed and stable  Last Vitals:  Vitals Value Taken Time  BP    Temp    Pulse 85 03/31/21 1332  Resp 12 03/31/21 1332  SpO2 98 % 03/31/21 1332  Vitals shown include unvalidated device data.  Last Pain:  Vitals:   03/31/21 0645  TempSrc: Oral         Complications: No notable events documented.

## 2021-03-31 NOTE — Anesthesia Procedure Notes (Signed)
Procedure Name: Intubation Date/Time: 03/31/2021 8:37 AM Performed by: Shary Decamp, CRNA Pre-anesthesia Checklist: Patient identified, Patient being monitored, Timeout performed, Emergency Drugs available and Suction available Patient Re-evaluated:Patient Re-evaluated prior to induction Oxygen Delivery Method: Circle System Utilized Preoxygenation: Pre-oxygenation with 100% oxygen Induction Type: IV induction Ventilation: Mask ventilation without difficulty Laryngoscope Size: Miller and 2 Grade View: Grade I Tube type: Oral Tube size: 7.5 mm Number of attempts: 1 Airway Equipment and Method: Stylet Placement Confirmation: ETT inserted through vocal cords under direct vision, positive ETCO2 and breath sounds checked- equal and bilateral Secured at: 23 cm Tube secured with: Tape Dental Injury: Teeth and Oropharynx as per pre-operative assessment

## 2021-03-31 NOTE — OR Nursing (Signed)
No retained products or instruments seen on imaging per Dr. Carole Civil (radiology).

## 2021-04-01 ENCOUNTER — Inpatient Hospital Stay (HOSPITAL_COMMUNITY): Payer: Medicare Other | Admitting: Anesthesiology

## 2021-04-01 ENCOUNTER — Encounter (HOSPITAL_COMMUNITY)
Admission: RE | Disposition: A | Discharge status: Home / Self Care | Payer: Self-pay | Source: Home / Self Care | Attending: Orthopedic Surgery

## 2021-04-01 ENCOUNTER — Inpatient Hospital Stay (HOSPITAL_COMMUNITY): Admission: RE | Admit: 2021-04-01 | Payer: Medicare Other | Source: Ambulatory Visit | Admitting: Orthopedic Surgery

## 2021-04-01 ENCOUNTER — Encounter (HOSPITAL_COMMUNITY): Payer: Self-pay | Admitting: Orthopedic Surgery

## 2021-04-01 ENCOUNTER — Inpatient Hospital Stay (HOSPITAL_COMMUNITY): Payer: Medicare Other

## 2021-04-01 DIAGNOSIS — Z9889 Other specified postprocedural states: Secondary | ICD-10-CM

## 2021-04-01 LAB — CBC
HCT: 34.2 % — ABNORMAL LOW (ref 39.0–52.0)
Hemoglobin: 11.5 g/dL — ABNORMAL LOW (ref 13.0–17.0)
MCH: 29.6 pg (ref 26.0–34.0)
MCHC: 33.6 g/dL (ref 30.0–36.0)
MCV: 88.1 fL (ref 80.0–100.0)
Platelets: 208 10*3/uL (ref 150–400)
RBC: 3.88 MIL/uL — ABNORMAL LOW (ref 4.22–5.81)
RDW: 12.5 % (ref 11.5–15.5)
WBC: 14.3 10*3/uL — ABNORMAL HIGH (ref 4.0–10.5)
nRBC: 0 % (ref 0.0–0.2)

## 2021-04-01 LAB — CREATININE, SERUM
Creatinine, Ser: 1.58 mg/dL — ABNORMAL HIGH (ref 0.61–1.24)
GFR, Estimated: 48 mL/min — ABNORMAL LOW (ref >60)

## 2021-04-01 SURGERY — POSTERIOR LUMBAR FUSION 2 LEVEL
Anesthesia: General | Site: Back

## 2021-04-01 MED ORDER — PROPOFOL 500 MG/50ML IV EMUL
INTRAVENOUS | Status: DC | PRN
  Administered 2021-04-01: 50 ug/kg/min via INTRAVENOUS

## 2021-04-01 MED ORDER — ONDANSETRON HCL 4 MG/2ML IJ SOLN
INTRAMUSCULAR | Status: AC
  Filled 2021-04-01: qty 2

## 2021-04-01 MED ORDER — SUCCINYLCHOLINE CHLORIDE 200 MG/10ML IV SOSY
PREFILLED_SYRINGE | INTRAVENOUS | Status: AC
  Filled 2021-04-01: qty 10

## 2021-04-01 MED ORDER — PHENYLEPHRINE 40 MCG/ML (10ML) SYRINGE FOR IV PUSH (FOR BLOOD PRESSURE SUPPORT)
PREFILLED_SYRINGE | INTRAVENOUS | Status: DC | PRN
  Administered 2021-04-01: 80 ug via INTRAVENOUS

## 2021-04-01 MED ORDER — MIDAZOLAM HCL 2 MG/2ML IJ SOLN
INTRAMUSCULAR | Status: AC
  Filled 2021-04-01: qty 2

## 2021-04-01 MED ORDER — SODIUM CHLORIDE 0.9 % IV SOLN
0.0125 ug/kg/min | Freq: Once | INTRAVENOUS | Status: AC
  Administered 2021-04-01: .05 ug/kg/min via INTRAVENOUS
  Filled 2021-04-01: qty 5000

## 2021-04-01 MED ORDER — SODIUM CHLORIDE 0.9 % IV SOLN: 250.0000 mL | INTRAVENOUS | Status: DC

## 2021-04-01 MED ORDER — DEXAMETHASONE SODIUM PHOSPHATE 10 MG/ML IJ SOLN
INTRAMUSCULAR | Status: DC | PRN
  Administered 2021-04-01: 10 mg via INTRAVENOUS

## 2021-04-01 MED ORDER — ONDANSETRON HCL 4 MG/2ML IJ SOLN: 4.0000 mg | Freq: Four times a day (QID) | INTRAMUSCULAR | Status: DC | PRN

## 2021-04-01 MED ORDER — ONDANSETRON HCL 4 MG PO TABS: 4.0000 mg | ORAL_TABLET | Freq: Three times a day (TID) | ORAL | 0 refills | Status: DC | PRN

## 2021-04-01 MED ORDER — METHOCARBAMOL 500 MG PO TABS: 500.0000 mg | ORAL_TABLET | Freq: Three times a day (TID) | ORAL | 0 refills | Status: AC | PRN

## 2021-04-01 MED ORDER — ONDANSETRON HCL 4 MG/2ML IJ SOLN
INTRAMUSCULAR | Status: DC | PRN
  Administered 2021-04-01: 4 mg via INTRAVENOUS

## 2021-04-01 MED ORDER — THROMBIN 20000 UNITS EX SOLR: CUTANEOUS | Status: DC | PRN

## 2021-04-01 MED ORDER — KETAMINE HCL 50 MG/5ML IJ SOSY
PREFILLED_SYRINGE | INTRAMUSCULAR | Status: AC
  Filled 2021-04-01: qty 5

## 2021-04-01 MED ORDER — PHENYLEPHRINE 40 MCG/ML (10ML) SYRINGE FOR IV PUSH (FOR BLOOD PRESSURE SUPPORT)
PREFILLED_SYRINGE | INTRAVENOUS | Status: AC
  Filled 2021-04-01: qty 10

## 2021-04-01 MED ORDER — PHENOL 1.4 % MT LIQD: 1.0000 | OROMUCOSAL | Status: DC | PRN

## 2021-04-01 MED ORDER — FENTANYL CITRATE (PF) 250 MCG/5ML IJ SOLN
INTRAMUSCULAR | Status: DC | PRN
  Administered 2021-04-01: 50 ug via INTRAVENOUS
  Administered 2021-04-01: 100 ug via INTRAVENOUS

## 2021-04-01 MED ORDER — ACETAMINOPHEN 650 MG RE SUPP: 650.0000 mg | RECTAL | Status: DC | PRN

## 2021-04-01 MED ORDER — FENTANYL CITRATE (PF) 250 MCG/5ML IJ SOLN
INTRAMUSCULAR | Status: AC
  Filled 2021-04-01: qty 5

## 2021-04-01 MED ORDER — LACTATED RINGERS IV SOLN: INTRAVENOUS | Status: DC | PRN

## 2021-04-01 MED ORDER — DEXAMETHASONE SODIUM PHOSPHATE 10 MG/ML IJ SOLN
INTRAMUSCULAR | Status: AC
  Filled 2021-04-01: qty 1

## 2021-04-01 MED ORDER — FENTANYL CITRATE (PF) 100 MCG/2ML IJ SOLN
25.0000 ug | INTRAMUSCULAR | Status: DC | PRN
  Administered 2021-04-01: 50 ug via INTRAVENOUS
  Filled 2021-04-01: qty 2

## 2021-04-01 MED ORDER — GABAPENTIN 300 MG PO CAPS
300.0000 mg | ORAL_CAPSULE | Freq: Three times a day (TID) | ORAL | Status: DC
  Administered 2021-04-01 – 2021-04-03 (×6): 300 mg via ORAL
  Filled 2021-04-01 (×6): qty 1

## 2021-04-01 MED ORDER — POLYETHYLENE GLYCOL 3350 17 G PO PACK: 17.0000 g | PACK | Freq: Every day | ORAL | Status: DC | PRN

## 2021-04-01 MED ORDER — OXYCODONE-ACETAMINOPHEN 10-325 MG PO TABS: 1.0000 | ORAL_TABLET | Freq: Four times a day (QID) | ORAL | 0 refills | Status: AC | PRN

## 2021-04-01 MED ORDER — SODIUM CHLORIDE 0.9% FLUSH: 3.0000 mL | INTRAVENOUS | Status: DC | PRN

## 2021-04-01 MED ORDER — KETAMINE HCL-SODIUM CHLORIDE 100-0.9 MG/10ML-% IV SOSY
PREFILLED_SYRINGE | INTRAVENOUS | Status: DC | PRN
  Administered 2021-04-01: 20 mg via INTRAVENOUS

## 2021-04-01 MED ORDER — EPHEDRINE SULFATE-NACL 50-0.9 MG/10ML-% IV SOSY
PREFILLED_SYRINGE | INTRAVENOUS | Status: DC | PRN
  Administered 2021-04-01: 10 mg via INTRAVENOUS
  Administered 2021-04-01: 5 mg via INTRAVENOUS
  Administered 2021-04-01: 10 mg via INTRAVENOUS

## 2021-04-01 MED ORDER — MENTHOL 3 MG MT LOZG: 1.0000 | LOZENGE | OROMUCOSAL | Status: DC | PRN

## 2021-04-01 MED ORDER — OXYCODONE HCL 5 MG PO TABS
10.0000 mg | ORAL_TABLET | ORAL | Status: DC | PRN
  Administered 2021-04-01 – 2021-04-03 (×8): 10 mg via ORAL
  Filled 2021-04-01 (×8): qty 2

## 2021-04-01 MED ORDER — OXYCODONE HCL 5 MG PO TABS
5.0000 mg | ORAL_TABLET | ORAL | Status: DC | PRN
  Administered 2021-04-01: 5 mg via ORAL
  Filled 2021-04-01: qty 1

## 2021-04-01 MED ORDER — BUPIVACAINE-EPINEPHRINE (PF) 0.25% -1:200000 IJ SOLN
INTRAMUSCULAR | Status: DC | PRN
  Administered 2021-04-01: 10 mL via PERINEURAL

## 2021-04-01 MED ORDER — PROPOFOL 10 MG/ML IV BOLUS
INTRAVENOUS | Status: AC
  Filled 2021-04-01: qty 20

## 2021-04-01 MED ORDER — PHENYLEPHRINE HCL-NACL 20-0.9 MG/250ML-% IV SOLN
INTRAVENOUS | Status: DC | PRN
  Administered 2021-04-01: 25 ug/min via INTRAVENOUS

## 2021-04-01 MED ORDER — SORBITOL 70 % SOLN
30.0000 mL | Freq: Every day | Status: DC | PRN
  Administered 2021-04-01 – 2021-04-03 (×3): 30 mL via ORAL
  Filled 2021-04-01 (×3): qty 30

## 2021-04-01 MED ORDER — ALUM & MAG HYDROXIDE-SIMETH 200-200-20 MG/5ML PO SUSP
30.0000 mL | Freq: Four times a day (QID) | ORAL | Status: DC | PRN
  Administered 2021-04-01: 30 mL via ORAL
  Filled 2021-04-01: qty 30

## 2021-04-01 MED ORDER — 0.9 % SODIUM CHLORIDE (POUR BTL) OPTIME
TOPICAL | Status: DC | PRN
  Administered 2021-04-01: 1000 mL

## 2021-04-01 MED ORDER — ONDANSETRON HCL 4 MG PO TABS: 4.0000 mg | ORAL_TABLET | Freq: Four times a day (QID) | ORAL | Status: DC | PRN

## 2021-04-01 MED ORDER — DOCUSATE SODIUM 100 MG PO CAPS
100.0000 mg | ORAL_CAPSULE | Freq: Two times a day (BID) | ORAL | Status: DC
  Administered 2021-04-01 – 2021-04-03 (×5): 100 mg via ORAL
  Filled 2021-04-01 (×5): qty 1

## 2021-04-01 MED ORDER — LIDOCAINE 2% (20 MG/ML) 5 ML SYRINGE
INTRAMUSCULAR | Status: AC
  Filled 2021-04-01: qty 5

## 2021-04-01 MED ORDER — ACETAMINOPHEN 325 MG PO TABS: 650.0000 mg | ORAL_TABLET | ORAL | Status: DC | PRN

## 2021-04-01 MED ORDER — PROPOFOL 10 MG/ML IV BOLUS
INTRAVENOUS | Status: DC | PRN
  Administered 2021-04-01: 10 mg via INTRAVENOUS
  Administered 2021-04-01: 150 mg via INTRAVENOUS
  Administered 2021-04-01: 30 mg via INTRAVENOUS
  Administered 2021-04-01: 20 mg via INTRAVENOUS

## 2021-04-01 MED ORDER — SUCCINYLCHOLINE CHLORIDE 200 MG/10ML IV SOSY
PREFILLED_SYRINGE | INTRAVENOUS | Status: DC | PRN
  Administered 2021-04-01: 140 mg via INTRAVENOUS

## 2021-04-01 MED ORDER — CEFAZOLIN SODIUM-DEXTROSE 1-4 GM/50ML-% IV SOLN
1.0000 g | Freq: Three times a day (TID) | INTRAVENOUS | Status: AC
  Administered 2021-04-01 (×2): 1 g via INTRAVENOUS
  Filled 2021-04-01 (×2): qty 50

## 2021-04-01 MED ORDER — ROCURONIUM BROMIDE 10 MG/ML (PF) SYRINGE
PREFILLED_SYRINGE | INTRAVENOUS | Status: AC
  Filled 2021-04-01: qty 10

## 2021-04-01 MED ORDER — LACTATED RINGERS IV SOLN: INTRAVENOUS | Status: DC

## 2021-04-01 MED ORDER — METHOCARBAMOL 1000 MG/10ML IJ SOLN
500.0000 mg | Freq: Four times a day (QID) | INTRAVENOUS | Status: DC | PRN
  Filled 2021-04-01: qty 5

## 2021-04-01 MED ORDER — CEFAZOLIN SODIUM 1 G IJ SOLR
INTRAMUSCULAR | Status: AC
  Filled 2021-04-01: qty 10

## 2021-04-01 MED ORDER — HYDROMORPHONE HCL 1 MG/ML IJ SOLN
1.0000 mg | INTRAMUSCULAR | Status: AC | PRN
  Administered 2021-04-01: 1 mg via INTRAVENOUS
  Filled 2021-04-01: qty 1

## 2021-04-01 MED ORDER — ACETAMINOPHEN 10 MG/ML IV SOLN: 1000.0000 mg | Freq: Once | INTRAVENOUS | Status: DC | PRN

## 2021-04-01 MED ORDER — FLEET ENEMA 7-19 GM/118ML RE ENEM: 1.0000 | ENEMA | Freq: Once | RECTAL | Status: DC | PRN

## 2021-04-01 MED ORDER — ENOXAPARIN SODIUM 40 MG/0.4ML IJ SOSY
40.0000 mg | PREFILLED_SYRINGE | INTRAMUSCULAR | Status: DC
  Administered 2021-04-02 – 2021-04-03 (×2): 40 mg via SUBCUTANEOUS
  Filled 2021-04-01 (×2): qty 0.4

## 2021-04-01 MED ORDER — ENOXAPARIN SODIUM 40 MG/0.4ML IJ SOSY: 40.0000 mg | PREFILLED_SYRINGE | INTRAMUSCULAR | 0 refills | Status: DC

## 2021-04-01 MED ORDER — METHOCARBAMOL 500 MG PO TABS
500.0000 mg | ORAL_TABLET | Freq: Four times a day (QID) | ORAL | Status: DC | PRN
  Administered 2021-04-01 – 2021-04-03 (×5): 500 mg via ORAL
  Filled 2021-04-01 (×5): qty 1

## 2021-04-01 SURGICAL SUPPLY — 87 items
AGENT HMST KT MTR STRL THRMB (HEMOSTASIS)
BAG COUNTER SPONGE SURGICOUNT (BAG) ×2 IMPLANT
BAG SPNG CNTER NS LX DISP (BAG)
BAND INSRT 18 STRL LF DISP RB (MISCELLANEOUS)
BAND RUBBER #18 3X1/16 STRL (MISCELLANEOUS) IMPLANT
BLADE CLIPPER SURG (BLADE) IMPLANT
BONE MATRIX OSTEOCEL PRO MED (Bone Implant) ×2 IMPLANT
BUR EGG ELITE 4.0 (BURR) ×2 IMPLANT
BUR MATCHSTICK NEURO 3.0 LAGG (BURR) ×2 IMPLANT
CABLE BIPOLOR RESECTION CORD (MISCELLANEOUS) ×3 IMPLANT
CAP RELINE MOD TULIP RMM (Cap) ×12 IMPLANT
CLIP PULSE STIMULATION (NEUROSURGERY SUPPLIES) ×2 IMPLANT
CLSR STERI-STRIP ANTIMIC 1/2X4 (GAUZE/BANDAGES/DRESSINGS) ×6 IMPLANT
CORD BIPOLAR FORCEPS 12FT (ELECTRODE) ×3 IMPLANT
COVER MAYO STAND STRL (DRAPES) ×4 IMPLANT
COVER SURGICAL LIGHT HANDLE (MISCELLANEOUS) ×3 IMPLANT
DRAIN CHANNEL 15F RND FF W/TCR (WOUND CARE) IMPLANT
DRAPE C-ARM 42X72 X-RAY (DRAPES) ×3 IMPLANT
DRAPE C-ARMOR (DRAPES) ×3 IMPLANT
DRAPE MICROSCOPE LEICA 46X105 (MISCELLANEOUS) IMPLANT
DRAPE POUCH INSTRU U-SHP 10X18 (DRAPES) ×4 IMPLANT
DRAPE SURG 17X11 SM STRL (DRAPES) ×3 IMPLANT
DRAPE SURG 17X23 STRL (DRAPES) ×3 IMPLANT
DRAPE U-SHAPE 47X51 STRL (DRAPES) ×6 IMPLANT
DRSG OPSITE POSTOP 4X6 (GAUZE/BANDAGES/DRESSINGS) IMPLANT
DRSG OPSITE POSTOP 4X8 (GAUZE/BANDAGES/DRESSINGS) ×3 IMPLANT
DURAPREP 26ML APPLICATOR (WOUND CARE) ×6 IMPLANT
ELECT BLADE 4.0 EZ CLEAN MEGAD (MISCELLANEOUS)
ELECT BLADE 6.5 EXT (BLADE) ×1 IMPLANT
ELECT CAUTERY BLADE 6.4 (BLADE) ×2 IMPLANT
ELECT PENCIL ROCKER SW 15FT (MISCELLANEOUS) ×4 IMPLANT
ELECT REM PT RETURN 9FT ADLT (ELECTROSURGICAL) ×3
ELECTRODE BLDE 4.0 EZ CLN MEGD (MISCELLANEOUS) IMPLANT
ELECTRODE REM PT RTRN 9FT ADLT (ELECTROSURGICAL) ×3 IMPLANT
EVACUATOR SILICONE 100CC (DRAIN) IMPLANT
GLOVE SURG ENC MOIS LTX SZ6.5 (GLOVE) ×3 IMPLANT
GLOVE SURG MICRO LTX SZ8.5 (GLOVE) ×6 IMPLANT
GLOVE SURG UNDER POLY LF SZ6.5 (GLOVE) ×3 IMPLANT
GLOVE SURG UNDER POLY LF SZ8.5 (GLOVE) ×6 IMPLANT
GOWN STRL REUS W/ TWL LRG LVL3 (GOWN DISPOSABLE) ×4 IMPLANT
GOWN STRL REUS W/TWL 2XL LVL3 (GOWN DISPOSABLE) ×8 IMPLANT
GOWN STRL REUS W/TWL LRG LVL3 (GOWN DISPOSABLE) ×6
KIT BASIN OR (CUSTOM PROCEDURE TRAY) ×6 IMPLANT
KIT POSITION SURG JACKSON T1 (MISCELLANEOUS) ×3 IMPLANT
KIT PULSE MIOM NDL (NEUROSURGERY SUPPLIES) IMPLANT
KIT PULSE MIOM NEEDLE (NEUROSURGERY SUPPLIES) ×3 IMPLANT
KIT TURNOVER KIT B (KITS) ×3 IMPLANT
NDL SPNL 18GX3.5 QUINCKE PK (NEEDLE) ×3 IMPLANT
NEEDLE 22X1 1/2 (OR ONLY) (NEEDLE) ×6 IMPLANT
NEEDLE SPNL 18GX3.5 QUINCKE PK (NEEDLE) ×6 IMPLANT
NS IRRIG 1000ML POUR BTL (IV SOLUTION) ×6 IMPLANT
PACK LAMINECTOMY ORTHO (CUSTOM PROCEDURE TRAY) ×4 IMPLANT
PACK UNIVERSAL I (CUSTOM PROCEDURE TRAY) ×4 IMPLANT
PAD ARMBOARD 7.5X6 YLW CONV (MISCELLANEOUS) ×6 IMPLANT
PATTIES SURGICAL .5 X.5 (GAUZE/BANDAGES/DRESSINGS) ×1 IMPLANT
PATTIES SURGICAL .5 X1 (DISPOSABLE) ×4 IMPLANT
POSITIONER HEAD PRONE TRACH (MISCELLANEOUS) ×3 IMPLANT
PROBE PULSE STIMULATION (NEUROSURGERY SUPPLIES) ×4 IMPLANT
PUTTY BONE DBX 5CC MIX (Putty) ×2 IMPLANT
ROD RELINE COCR LORD 5.0X65 (Rod) ×2 IMPLANT
ROD RELINE COCR LORD 5.0X70MM (Rod) ×2 IMPLANT
SCREW LOCK RSS 4.5/5.0MM (Screw) ×12 IMPLANT
SCREW SHANK RELINE MOD 5.5X35 (Screw) ×6 IMPLANT
SCREW SHANK RELINE MOD 6.5X35 (Screw) ×4 IMPLANT
SHANK RELINE MOD 5.5X40 (Screw) ×2 IMPLANT
SPONGE SURGIFOAM ABS GEL 100 (HEMOSTASIS) ×3 IMPLANT
SPONGE T-LAP 4X18 ~~LOC~~+RFID (SPONGE) ×6 IMPLANT
STAPLER VISISTAT 35W (STAPLE) IMPLANT
SURGIFLO W/THROMBIN 8M KIT (HEMOSTASIS) IMPLANT
SUT BONE WAX W31G (SUTURE) ×4 IMPLANT
SUT MNCRL AB 3-0 PS2 18 (SUTURE) ×6 IMPLANT
SUT MNCRL AB 3-0 PS2 27 (SUTURE) ×3 IMPLANT
SUT VIC AB 1 CT1 18XCR BRD 8 (SUTURE) ×4 IMPLANT
SUT VIC AB 1 CT1 27 (SUTURE)
SUT VIC AB 1 CT1 27XBRD ANTBC (SUTURE) IMPLANT
SUT VIC AB 1 CT1 8-18 (SUTURE) ×6
SUT VIC AB 1 CTX 36 (SUTURE)
SUT VIC AB 1 CTX36XBRD ANBCTR (SUTURE) IMPLANT
SUT VIC AB 2-0 CT1 18 (SUTURE) ×6 IMPLANT
SUT VICRYL 0 UR6 27IN ABS (SUTURE) ×3 IMPLANT
SYR BULB IRRIG 60ML STRL (SYRINGE) ×6 IMPLANT
SYR CONTROL 10ML LL (SYRINGE) ×6 IMPLANT
TOWEL GREEN STERILE (TOWEL DISPOSABLE) ×6 IMPLANT
TOWEL GREEN STERILE FF (TOWEL DISPOSABLE) ×6 IMPLANT
TRAY FOLEY MTR SLVR 16FR STAT (SET/KITS/TRAYS/PACK) ×1 IMPLANT
WATER STERILE IRR 1000ML POUR (IV SOLUTION) ×6 IMPLANT
YANKAUER SUCT BULB TIP NO VENT (SUCTIONS) ×3 IMPLANT

## 2021-04-01 NOTE — Progress Notes (Signed)
    Subjective: Procedure(s) (LRB): POSTERIOR LUMBAR FUSION 2 LEVEL (POSTERIOR SPINAL FUSION INTERBODY, POSSIBLE POSTERIOR DECOMPRESSION) (N/A) LUMBAR LAMINECTOMY/DECOMPRESSION MICRODISCECTOMY 2 LEVELS (N/A) Day of Surgery  Patient reports pain as 3 on 0-10 scale.  Reports decreased leg pain reports incisional back pain   N/A void - foley in place Negative bowel movement Negative flatus Negative chest pain or shortness of breath  Objective: Vital signs in last 24 hours: Temp:  [97.5 F (36.4 C)-98.5 F (36.9 C)] 98.1 F (36.7 C) (11/03 0446) Pulse Rate:  [58-87] 63 (11/03 0446) Resp:  [10-20] 18 (11/03 0446) BP: (103-138)/(58-75) 126/58 (11/03 0446) SpO2:  [94 %-100 %] 94 % (11/03 0446)  Intake/Output from previous day: 11/02 0701 - 11/03 0700 In: 1850 [I.V.:1800; IV Piggyback:50] Out: 2300 [Urine:2200; Blood:100]  Labs: Recent Labs    03/29/21 0900  WBC 6.4  RBC 4.65  HCT 41.8  PLT 319   Recent Labs    03/29/21 0900  NA 138  K 3.7  CL 101  CO2 28  BUN 23  CREATININE 1.52*  GLUCOSE 103*  CALCIUM 8.9   No results for input(s): LABPT, INR in the last 72 hours.  Physical Exam: Neurologically intact ABD soft Intact pulses distally Dorsiflexion/Plantar flexion intact Incision: dressing C/D/I Compartment soft Body mass index is 27.73 kg/m.   Assessment/Plan: Patient stable  Reports decreased right radicular leg pain. His only complaint is incisional abdominal pain. At this point given the fact that his neuropathic preoperative right leg pain has improved I have recommended we move forward with the supplemental posterior pedicle screw fixation but there is no need for additional decompression.  The patient is in agreement with the plan.  I have again reviewed the risks, benefits, and alternatives and he is expressed a willingness to move forward with surgery.  Venita Lick, MD [facility] [phone]

## 2021-04-01 NOTE — Brief Op Note (Signed)
04/01/2021  10:17 AM  PATIENT:  Derrick [name]  65 y.o. male  PRE-OPERATIVE DIAGNOSIS:  Degenerative disc disease with radiculopathy L4-S1  POST-OPERATIVE DIAGNOSIS:  Degenerative disc disease with radiculopathy L4-S1  PROCEDURE:  Procedure(s) with comments: POSTERIOR LUMBAR FUSION 2 LEVEL (POSTERIOR SPINAL FUSION INTERBODY (N/A) - 5 HRS 3 C-BED  SURGEON:  Surgeon(s) and Role:    Venita Lick, MD - Primary  PHYSICIAN ASSISTANT:   ASSISTANTS: Voncille Lo, PA  ANESTHESIA:   general  EBL:  100 mL   BLOOD ADMINISTERED:none  DRAINS: none   LOCAL MEDICATIONS USED:  MARCAINE     SPECIMEN:  No Specimen  DISPOSITION OF SPECIMEN:  N/A  COUNTS:  YES  TOURNIQUET:  * No tourniquets in log *  DICTATION: .Dragon Dictation  PLAN OF CARE: Admit to inpatient   PATIENT DISPOSITION:  PACU - hemodynamically stable.

## 2021-04-01 NOTE — Discharge Instructions (Signed)
  Spinal Fusion, Adult, Care After This sheet gives you information about how to care for yourself after your procedure. Your doctor may also give you more specific instructions. If you have problems or questions, contact your doctor. Follow these instructions at home: Medicines Take over-the-counter and prescription medicines only as told by your doctor. These include any medicines for pain or blood-thinning medicines (anticoagulants). If you were prescribed an antibiotic medicine, take it as told by your doctor. Do not stop taking the antibiotic even if you start to feel better. Do not drive for 24 hours if you were given a medicine to help you relax (sedative) during your procedure. Do not drive or use heavy machinery while taking prescription pain medicine. If you have a brace: Wear the brace as told by your doctor. Take it off only as told by your doctor. Keep the brace clean. Managing pain, stiffness, and swelling If directed, put ice on the surgery area: If you have a removable brace, take it off as told by your doctor. Put ice in a plastic bag. Place a towel between your skin and the bag. Leave the ice on for 20 minutes, 2-3 times a day. Surgery cut care    Follow instructions from your doctor about how to take care of your cut from surgery (incision). Make sure you: Wash your hands with soap and water before you change your bandage (dressing). If you cannot use soap and water, use hand sanitizer. Change your bandage as told by your doctor. Leave stitches (sutures), skin glue, or skin tape (adhesive) strips in place. They may need to stay in place for 2 weeks or longer. If tape strips get loose and curl up, you may trim the loose edges. Do not remove tape strips completely unless your doctor says it is okay. Keep your cut from surgery clean and dry. Do not take baths, swim, or use a hot tub until your doctor says it is okay. Ask your doctor if you can take showers. You may only  be allowed to take sponge baths. Every day, check your cut from surgery and the area around it for: More redness, swelling, or pain. Fluid or blood. Warmth. Pus or a bad smell. If you have a drain tube, follow instructions from your doctor about caring for it. Do not take out the drain tube or any bandages unless your doctor says it is okay. Physical activity Rest and protect your back as much as possible. Follow instructions from your doctor about how to move. Use good posture to help your spine heal. Do not lift anything that is heavier than 8 lb (3.6 kg), or the limit that you are told, until your doctor says that it is safe. Do not twist or bend at the waist until your doctor says it is okay. It is best if you: Do not make pushing and pulling motions. Do not sit or lie down in the same position for a long time. Do not raise your hands or arms above your head. Return to your normal activities as told by your doctor. Ask your doctor what activities are safe for you. Rest and protect your back as much as you can. Do not start to exercise until your doctor says it is okay. Ask your doctor what kinds of exercise you can do to make your back stronger. Ok to shower in 5 days.  Do not take a bath or submerge the wound General instructions To prevent blood clots and lessen swelling   in your legs: Wear compression stockings as told. Walk one or more times every few hours as told by your doctor. Do not use any products that contain nicotine or tobacco, such as cigarettes and e-cigarettes. These can delay bone healing. If you need help quitting, ask your doctor. To prevent or treat constipation while you are taking prescription pain medicine, your doctor may suggest that you: Drink enough fluid to keep your pee (urine) pale yellow. Take over-the-counter or prescription medicines. Eat foods that are high in fiber. These include fresh fruits and vegetables, whole grains, and beans. Limit foods that  are high in fat and processed sugars, such as fried and sweet foods. Keep all follow-up visits as told by your doctor. This is important. Contact a doctor if: Your pain gets worse. Your medicine does not help your pain. Your legs or feet get painful or swollen. Your cut from surgery is more red, swollen, or painful. Your cut from surgery feels warm to the touch. You have: Fluid or blood coming from your cut from surgery. Pus or a bad smell coming from your cut from surgery. A fever. Weakness or loss of feeling (numbness) in your legs that is new or getting worse. Trouble controlling when you pee (urinate) or poop (have a bowel movement). You feel sick to your stomach (nauseous). You throw up (vomit). Get help right away if: Your pain is very bad. You have chest pain. You have trouble breathing. You start to have a cough. These symptoms may be an emergency. Do not wait to see if the symptoms will go away. Get medical help right away. Call your local emergency services (911 in the [place] Do not drive yourself to the hospital. Summary After the procedure, it is common to have pain in your back and pain by your surgery cut(s). Icing and pain medicines may help to control the pain. Follow directions from your doctor. Rest and protect your back as much as possible. Do not twist or bend at the waist. Get up and walk one or more times every few hours as told by your doctor. This information is not intended to replace advice given to you by your health care provider. Make sure you discuss any questions you have with your health care provider.  -signs and symptoms of a blood clot such as chest pain; shortness of breath; pain, swelling, or warmth in the leg -signs and symptoms of a stroke such as changes in vision; confusion; trouble speaking or understanding; severe headaches; sudden numbness or weakness of the face, arm or leg; trouble walking; dizziness; loss of coordination Side effects that  usually do not require medical attention (report to your doctor or health care professional if they continue or are bothersome): -hair loss -pain, redness, or irritation at site where injected This list may not describe all possible side effects. Call your doctor for medical advice about side effects. You may report side effects to FDA at 1-800-FDA-1088. Where should I keep my medicine? Keep out of the reach of children. Store at room temperature between 15 and 30 degrees C (59 and 86 degrees F). Do not freeze. If your injections have been specially prepared, you may need to store them in the refrigerator. Ask your pharmacist. Throw away any unused medicine after the expiration date. NOTE: This sheet is a summary. It may not cover all possible information. If you have questions about this medicine, talk to your doctor, pharmacist, or health care provider.    Enoxaparin injection   What is this medicine? ENOXAPARIN (ee nox a PA rin) is used after knee, hip, or abdominal surgeries to prevent blood clotting. It is also used to treat existing blood clots in the lungs or in the veins. This medicine may be used for other purposes; ask your health care provider or pharmacist if you have questions. COMMON BRAND NAME(S): Lovenox What should I tell my health care provider before I take this medicine? They need to know if you have any of these conditions: bleeding disorders, hemorrhage, or hemophilia infection of the heart or heart valves kidney or liver disease previous stroke prosthetic heart valve recent surgery or delivery of a baby ulcer in the stomach or intestine, diverticulitis, or other bowel disease an unusual or allergic reaction to enoxaparin, heparin, pork or pork products, other medicines, foods, dyes, or preservatives pregnant or trying to get pregnant breast-feeding How should I use this medicine? This medicine is for injection under the skin. It is usually given by a health-care  professional. You or a family member may be trained on how to give the injections. If you are to give yourself injections, make sure you understand how to use the syringe, measure the dose if necessary, and give the injection. To avoid bruising, do not rub the site where this medicine has been injected. Do not take your medicine more often than directed. Do not stop taking except on the advice of your doctor or health care professional. Make sure you receive a puncture-resistant container to dispose of the needles and syringes once you have finished with them. Do not reuse these items. Return the container to your doctor or health care professional for proper disposal. Talk to your pediatrician regarding the use of this medicine in children. Special care may be needed. Overdosage: If you think you have taken too much of this medicine contact a poison control center or emergency room at once. NOTE: This medicine is only for you. Do not share this medicine with others. What if I miss a dose? If you miss a dose, take it as soon as you can. If it is almost time for your next dose, take only that dose. Do not take double or extra doses. What may interact with this medicine? aspirin and aspirin-like medicines certain medicines that treat or prevent blood clots dipyridamole NSAIDs, medicines for pain and inflammation, like ibuprofen or naproxen This list may not describe all possible interactions. Give your health care provider a list of all the medicines, herbs, non-prescription drugs, or dietary supplements you use. Also tell them if you smoke, drink alcohol, or use illegal drugs. Some items may interact with your medicine. What should I watch for while using this medicine? Visit your healthcare professional for regular checks on your progress. You may need blood work done while you are taking this medicine. Your condition will be monitored carefully while you are receiving this medicine. It is important  not to miss any appointments. If you are going to need surgery or other procedure, tell your healthcare professional that you are using this medicine. Using this medicine for a long time may weaken your bones and increase the risk of bone fractures. Avoid sports and activities that might cause injury while you are using this medicine. Severe falls or injuries can cause unseen bleeding. Be careful when using sharp tools or knives. Consider using an electric razor. Take special care brushing or flossing your teeth. Report any injuries, bruising, or red spots on the skin to your healthcare professional.   Wear a medical ID bracelet or chain. Carry a card that describes your disease and details of your medicine and dosage times. What side effects may I notice from receiving this medicine? Side effects that you should report to your doctor or health care professional as soon as possible: allergic reactions like skin rash, itching or hives, swelling of the face, lips, or tongue bone pain signs and symptoms of bleeding such as bloody or black, tarry stools; red or dark-brown urine; spitting up blood or brown material that looks like coffee grounds; red spots on the skin; unusual bruising or bleeding from the eye, gums, or nose signs and symptoms of a blood clot such as chest pain; shortness of breath; pain, swelling, or warmth in the leg signs and symptoms of a stroke such as changes in vision; confusion; trouble speaking or understanding; severe headaches; sudden numbness or weakness of the face, arm or leg; trouble walking; dizziness; loss of coordination Side effects that usually do not require medical attention (report to your doctor or health care professional if they continue or are bothersome): hair loss pain, redness, or irritation at site where injected This list may not describe all possible side effects. Call your doctor for medical advice about side effects. You may report side effects to FDA at  1-800-FDA-1088. Where should I keep my medicine? Keep out of the reach of children. Store at room temperature between 15 and 30 degrees C (59 and 86 degrees F). Do not freeze. If your injections have been specially prepared, you may need to store them in the refrigerator. Ask your pharmacist. Throw away any unused medicine after the expiration date. NOTE: This sheet is a summary. It may not cover all possible information. If you have questions about this medicine, talk to your doctor, pharmacist, or health care provider. 

## 2021-04-01 NOTE — Progress Notes (Signed)
Patient transferred to OR via transport for stage 2 surgery by Dr Shon Baton. Patient in no acute distress at the time of transfer. Report given to [facility]. Will follow up.

## 2021-04-01 NOTE — Progress Notes (Signed)
[facility]  Subjective  - seen in recovery after posterior approach with Dr. Shon Baton..  Drowsy.     Objective (!) 124/58 67 98 F (36.7 C) (Oral) 20 95%  Intake/Output Summary (Last 24 hours) at 04/01/2021 1212 Last data filed at 04/01/2021 1129 Gross per 24 hour  Intake 2650 ml  Output 2700 ml  Net -50 ml   Abdomen with appropriate post-op incisional tenderness Lower abdominal and flank incision c/d/I Left DP palpable   Laboratory Lab Results: No results for input(s): WBC, HGB, HCT, PLT in the last 72 hours. BMET No results for input(s): NA, K, CL, CO2, GLUCOSE, BUN, CREATININE, CALCIUM in the last 72 hours.  COAG Lab Results  Component Value Date   INR 1.32 03/02/2017   INR 1.02 03/01/2017   INR 1.0 02/27/2017   No results found for: PTT  Assessment/Planning:  POD#1 s/p L5-S1 ALIF and L4-L5 OLIF.  Looks good.  Appropriate post-op incisional tenderness.  Left DP palpable.    Cephus Shelling 04/01/2021 12:12 PM --

## 2021-04-01 NOTE — Anesthesia Procedure Notes (Signed)
Procedure Name: Intubation Date/Time: 04/01/2021 7:44 AM Performed by: Lovie Chol, CRNA Pre-anesthesia Checklist: Patient identified, Emergency Drugs available, Suction available and Patient being monitored Patient Re-evaluated:Patient Re-evaluated prior to induction Oxygen Delivery Method: Circle System Utilized Preoxygenation: Pre-oxygenation with 100% oxygen Induction Type: IV induction Ventilation: Mask ventilation without difficulty Laryngoscope Size: Glidescope and 4 Grade View: Grade I Tube type: Oral Tube size: 7.5 mm Number of attempts: 1 Airway Equipment and Method: Stylet and Oral airway Placement Confirmation: ETT inserted through vocal cords under direct vision, positive ETCO2 and breath sounds checked- equal and bilateral Secured at: 22 cm Tube secured with: Tape Dental Injury: Teeth and Oropharynx as per pre-operative assessment

## 2021-04-01 NOTE — Anesthesia Postprocedure Evaluation (Signed)
Anesthesia Post Note  Patient: JAQUIN COY  Procedure(s) Performed: POSTERIOR LUMBAR FUSION 2 LEVEL (POSTERIOR SPINAL FUSION INTERBODY (Back)     Patient location during evaluation: PACU Anesthesia Type: General Level of consciousness: awake and alert Pain management: pain level controlled Vital Signs Assessment: post-procedure vital signs reviewed and stable Respiratory status: spontaneous breathing, nonlabored ventilation, respiratory function stable and patient connected to nasal cannula oxygen Cardiovascular status: blood pressure returned to baseline and stable Postop Assessment: no apparent nausea or vomiting Anesthetic complications: no   No notable events documented.  Last Vitals:  Vitals:   04/01/21 1130 04/01/21 1149  BP:  (!) 124/58  Pulse:  67  Resp:  20  Temp: 37.3 C 36.7 C  SpO2:  95%    Last Pain:  Vitals:   04/01/21 1200  TempSrc:   PainSc: 3                  B[place] P Mihika Surrette

## 2021-04-01 NOTE — Anesthesia Postprocedure Evaluation (Signed)
Anesthesia Post Note  Patient: Derrick [name]  Procedure(s) Performed: ANTERIOR LUMBAR FUSION 1 LEVEL LUMBAR FIVE TO SACRAL ONE OBLIQUE LUMBAR INTERBODY FUSION 1 LEVEL LUMBAR FOUR  TO LUMBAR FIVE ABDOMINAL EXPOSURE     Patient location during evaluation: PACU Anesthesia Type: General Level of consciousness: sedated and patient cooperative Pain management: pain level controlled Vital Signs Assessment: post-procedure vital signs reviewed and stable Respiratory status: spontaneous breathing Cardiovascular status: stable Anesthetic complications: no   No notable events documented.  Last Vitals:  Vitals:   04/01/21 0446 04/01/21 1030  BP: (!) 126/58 136/63  Pulse: 63 71  Resp: 18 14  Temp: 36.7 C 37.3 C  SpO2: 94% 100%    Last Pain:  Vitals:   04/01/21 0446  TempSrc: Oral  PainSc:                  [name]

## 2021-04-01 NOTE — Transfer of Care (Signed)
Immediate Anesthesia Transfer of Care Note  Patient: Derrick [name]  Procedure(s) Performed: POSTERIOR LUMBAR FUSION 2 LEVEL (POSTERIOR SPINAL FUSION INTERBODY (Back)  Patient Location: PACU  Anesthesia Type:General  Level of Consciousness: oriented, drowsy and patient cooperative  Airway & Oxygen Therapy: Patient Spontanous Breathing and Patient connected to face mask oxygen  Post-op Assessment: Report given to RN and Post -op Vital signs reviewed and stable  Post vital signs: Reviewed  Last Vitals:  Vitals Value Taken Time  BP 136/63 04/01/21 1029  Temp    Pulse 72 04/01/21 1032  Resp 18 04/01/21 1032  SpO2 100 % 04/01/21 1032  Vitals shown include unvalidated device data.  Last Pain:  Vitals:   04/01/21 0446  TempSrc: Oral  PainSc:       Patients Stated Pain Goal: 3 (04/01/21 0333)  Complications: No notable events documented.

## 2021-04-01 NOTE — Op Note (Signed)
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  That is post ALIF L5-S1 and OLIF L4-5  POST-OPERATIVE DIAGNOSIS: Same  PROCEDURE:   Posterior lumbar spinal fusion with instrumentation L4-S1  SURGEON:  Venita Lick, MD  PHYSICIAN ASSISTANT: Voncille Lo, PA  ANESTHESIA:   General  EBL: 100 ml   Complications: None  Implants: NuVasive cortical pedicle screws.  Left: L4 and L5: 5.5 x 35.  Right: L4: 5.5 x 40.  L5: 5.5 x 35.  S1: 6.5 x 35 bilaterally  Graft: osteocell and DBX mix  Neuro monitoring: No abnormal signal changes with EMG and SSEP monitoring throughout the case.  All pedicle screws were directly stimulated with positive responses ranging from 19 mA to greater than 40 mA.  BRIEF HISTORY: JAVONN GAUGER is a 65 y.o. male who has had significant back buttock and neuropathic leg pain.  Despite conservative management his quality of life is failed to improve.  Yesterday we went through with her for staged anterior instrumentation.  This morning he noted improvement in the preoperative neuropathic right leg pain.  As result of the improvement in the leg pain we elected to move forward with the posterior supplemental fusion with instrumentation and bone graft.  I explained this to the patient as well as the risks and benefits and all of his questions were encouraged and addressed.  PROCEDURE DETAILS: Patient was brought into the operating room and was properly positioned on the operating room table.  After induction with general  anesthesia the patient was endotracheally intubated.  A timeout was taken to confirm all important data: including patient, procedure, and the level. Teds, SCD's were applied.   The patient was turned prone onto the spine frame and all bony prominences were well-padded.  The back was then  prepped and draped in a standard fashion.  The neuro monitoring representative did apply all appropriate pads and needles for intraoperative SSEP and EMG activity monitoring.  Using AP fluoroscopy identified the inferior portion of the L4 pedicle and S1 pedicle.  I then drew my line in the midline.  I infiltrated the marked out incision site with quarter percent Marcaine with epinephrine.  Midline incision was made sharp dissection was carried out down to the deep fascia.  The deep fascia was sharply incised and I stripped the paraspinal muscles to expose the L3-4, L4-5, and L5-S1 facet complexes.  I then stripped the facet capsule from the L4-5 and L5-S1 facet.  Using a high-speed bur identified the 7 o'clock position on the right L4 pedicle I confirmed positioning with fluoroscopy.  I then advanced the burr in order to create a cortical defect.  I then used the rectal probe to advance into the pedicle.  The trajectory was imaged from the 7 o'clock position towards the 1 o'clock position on the pedicle.  Once I was nearing the 1 o'clock position I confirmed on the lateral view that I was just beyond the posterior wall of the vertebral body.  Once I confirm satisfactory tradition I advanced into the vertebral body.  I then removed the pedicle probe and palpated the canal with a ball-tipped feeler.  I then used the 5.5 tap and then repalpated the hole.  Once I confirmed that a solid bony canal I then placed the 5.5 x 40 mm length screw at the L4 level.  Using this exact same technique I prepped the L5 pedicle on the right side.  This time I used the 5.5 x 35 mm length screw.  I then identified the 9 o'clock position on the S1 pedicle and advanced my pedicle probe aiming towards the 3 o'clock position.  I confirmed trajectory with AP fluoroscopy and as I neared the wall of the pedicle I confirmed on the lateral view that I was within the vertebral body itself.  I then removed the probe palpated the bony canal and  then placed my tap and repalpated the hole.  I then placed a 6.5 x 35 mm length screw.  With the right L4-S1 screws placed I then turned my attention to the left side.  Using the same technique that had used on the right side I placed the pedicle screws on the left.  Only change to the technique was brought in position at L4 and L5 was at the 5 o'clock position and I aimed towards the 11 o'clock position.  At S1 the starting position was at the 3:00 and aimed towards the 9 o'clock position.  All 6 pedicle screws had excellent purchase.  I then directly stimulated all 6 pedicles.  On the left side L4 noted positive activity at 36 mA, L5 and S1 there was no activity at greater than 40 mA.  On the right side: The L4 screw had positive activity at 19 mA, L5 at 40 mA, and S1 at 24 mA.  Final imaging demonstrated satisfactory positioning of all 6 screws in the AP and lateral plane.  At this point the polyaxial heads were attached and I used a high-speed bur to decorticate the L4-5 and L5-S1 facet complexes to facilitate bone graft positioning.  The allograft bone was packed in the posterior gutter and over the facet joints and then the appropriate size rod was obtained.  On the right side a 65 mm length rod was position and secured with 3 locking caps on the left side a 70 mm length rod was used and secured with 3 locking caps.  All 6 locking caps were tightened according to manufacture standards with the torque wrench.  At this point the wound was copiously irrigated with normal saline and hemostasis was confirmed.  Final images demonstrated satisfactory position of the anterior intervertebral body cages and all 6 pedicle screws.  The wound was then closed in a layered fashion with interrupted #1 Vicryl sutures for the deep fascia, followed by a layer 2-0 Vicryl suture, and 3-0 Monocryl for the skin.  Steri-Strips and dry dressings were applied and the patient was ultimately extubated transfer the PACU without  incident.  The end of the case all needle and sponge counts were correct.  There were no adverse intraoperative events.  Wrist Assistant: Voncille Lo, PA: She was instrumental in assisting with positioning of the patient, retraction to allow for visualization, as well as wound closure.  Venita Lick, MD 04/01/2021 10:04 AM '

## 2021-04-02 ENCOUNTER — Other Ambulatory Visit: Payer: Self-pay | Admitting: Cardiovascular Disease

## 2021-04-02 ENCOUNTER — Encounter (HOSPITAL_COMMUNITY): Payer: Self-pay | Admitting: Orthopedic Surgery

## 2021-04-02 ENCOUNTER — Inpatient Hospital Stay (HOSPITAL_COMMUNITY): Payer: Medicare Other

## 2021-04-02 DIAGNOSIS — Z981 Arthrodesis status: Secondary | ICD-10-CM

## 2021-04-02 MED FILL — Heparin Sodium (Porcine) Inj 1000 Unit/ML: INTRAMUSCULAR | Qty: 30 | Status: AC

## 2021-04-02 MED FILL — Sodium Chloride IV Soln 0.9%: INTRAVENOUS | Qty: 1000 | Status: AC

## 2021-04-02 NOTE — Progress Notes (Signed)
Subjective: 1 Day Post-Op Procedure(s) (LRB): POSTERIOR LUMBAR FUSION 2 LEVEL (POSTERIOR SPINAL FUSION INTERBODY (N/A) Patient reports pain as moderate.   Leg pain improved, some dysethesias. Tolerating PO without N/v +ambulation +void +flatus, - BM Denies sob, cp, calf pain, sweats/chills  Objective: Vital signs in last 24 hours: Temp:  [97.8 F (36.6 C)-99.2 F (37.3 C)] 98 F (36.7 C) (11/04 0256) Pulse Rate:  [61-74] 72 (11/04 0256) Resp:  [12-20] 20 (11/04 0256) BP: (116-164)/(53-66) 152/66 (11/04 0256) SpO2:  [93 %-100 %] 93 % (11/04 0256)  Intake/Output from previous day: 11/03 0701 - 11/04 0700 In: 1850 [I.V.:1800; IV Piggyback:50] Out: 1300 [Urine:1200; Blood:100] Intake/Output this shift: No intake/output data recorded.  Recent Labs    04/01/21 1215  HGB 11.5*   Recent Labs    04/01/21 1215  WBC 14.3*  RBC 3.88*  HCT 34.2*  PLT 208   Recent Labs    04/01/21 1215  CREATININE 1.58*   No results for input(s): LABPT, INR in the last 72 hours.  Neurologically intact ABD soft Neurovascular intact Sensation intact distally Intact pulses distally Dorsiflexion/Plantar flexion intact Incision: scant drainage No cellulitis present Compartment soft Negative Homans, calves non-tender   Assessment/Plan: 1 Day Post-Op Procedure(s) (LRB): POSTERIOR LUMBAR FUSION 2 LEVEL (POSTERIOR SPINAL FUSION INTERBODY (N/A) Advanced diet Up  with therapy. LSO when OOB DVT ppx: Will get LE doppler to R/o DVT per abdominal exposure protocol, will start xarelto today Encourage IS  Plan for D/C tomorrow as long as patient has a BM. Scripts in chart. AVS printed.  F/u with Dr. Shon Baton in 2 weeks   Derrick [name] 04/02/2021, 7:25 AM

## 2021-04-02 NOTE — Evaluation (Signed)
Occupational Therapy Evaluation/Discharge Patient Details Name: Derrick [name] MRN: [id number] DOB: 13-Oct-1955 Today's Date: 04/02/2021   History of Present Illness 65 y.o. male presents to [facility] hospital 03/31/2021 with 2 level DDD with neurogenic claudication. Pt underwent L4-5 OLIF and L5-S1 ALIF on 03/31/2021, with L4-S1 PLIF on 04/01/2021. PMH includes HTN, HLD, CAD s/p CABG, PVD.   Clinical Impression   PTA, pt lives with spouse and typically Independent with ADLs, mobility and shares IADLs with wife. Pt presents now s/p procedure above with minor deficits in strength and LE flexibility. Educated on spinal precautions for ADLs/IADLs with pt able to demo UB ADLs (including brace mgmt) Independently and Min A for LB ADLs due to difficulty with L LE. Pt's wife present and able to successfully assist pt with LB ADLs as needed, confirms she can continue this support at home. Pt/spouse with questions regarding toilet transfers - encouraged to trial [facility] over toilet to allow pt to have armrests to push up from in order to maintain optimal body mechanics. Pt/spouse have all needed DME at home and no further skilled OT services needed at this time. OT to sign off.     Recommendations for follow up therapy are one component of a multi-disciplinary discharge planning process, led by the attending physician.  Recommendations may be updated based on patient status, additional functional criteria and insurance authorization.   Follow Up Recommendations  No OT follow up    Assistance Recommended at Discharge Intermittent Supervision/Assistance  Functional Status Assessment  Patient has had a recent decline in their functional status and demonstrates the ability to make significant improvements in function in a reasonable and predictable amount of time.  Equipment Recommendations  None recommended by OT (well equipped)    Recommendations for Other Services       Precautions / Restrictions  Precautions Precautions: Fall;Back Precaution Booklet Issued: Yes (comment) Required Braces or Orthoses: Spinal Brace Spinal Brace: Lumbar corset;Applied in sitting position Restrictions Weight Bearing Restrictions: No      Mobility Bed Mobility Overal bed mobility: Needs Assistance Bed Mobility: Rolling;Sidelying to Sit Rolling: Modified independent (Device/Increase time) Sidelying to sit: Supervision       General bed mobility comments: received sitting EOB    Transfers Overall transfer level: Independent Equipment used: None                      Balance Overall balance assessment: Mild deficits observed, not formally tested                                         ADL either performed or assessed with clinical judgement   ADL Overall ADL's : Needs assistance/impaired Eating/Feeding: Independent;Sitting   Grooming: Modified independent;Standing   Upper Body Bathing: Independent;Sitting   Lower Body Bathing: Minimal assistance   Upper Body Dressing : Independent;Sitting Upper Body Dressing Details (indicate cue type and reason): able to don shirt and manage LSO brace Lower Body Dressing: Minimal assistance;Sit to/from stand Lower Body Dressing Details (indicate cue type and reason): Difficulty managing clothing around L LE - wife present and assisting Toilet Transfer: Modified Independent;Ambulation   Toileting- Clothing Manipulation and Hygiene: Modified independent         General ADL Comments: Discussed use of BSC over toilet to allow pt to have something to push up from due to tall stature. Wife confirms she can assist as needed  with ADLs     Vision Baseline Vision/History: 1 Wears glasses Ability to See in Adequate Light: 0 Adequate Vision Assessment?: No apparent visual deficits     Perception     Praxis      Pertinent Vitals/Pain Pain Assessment: Faces Pain Score: 5  Faces Pain Scale: No hurt Pain Location:  back Pain Descriptors / Indicators: Sore Pain Intervention(s): Monitored during session     Hand Dominance Right   Extremity/Trunk Assessment Upper Extremity Assessment Upper Extremity Assessment: Overall WFL for tasks assessed   Lower Extremity Assessment Lower Extremity Assessment: Defer to PT evaluation   Cervical / Trunk Assessment Cervical / Trunk Assessment: Back Surgery   Communication Communication Communication: No difficulties   Cognition Arousal/Alertness: Awake/alert Behavior During Therapy: WFL for tasks assessed/performed Overall Cognitive Status: Within Functional Limits for tasks assessed                                       General Comments  VSS on RA    Exercises     Shoulder Instructions      Home Living Family/patient expects to be discharged to:: Private residence Living Arrangements: Spouse/significant other Available Help at Discharge: Family;Available 24 hours/day (wife works from home) Type of Home: House Home Access: Stairs to enter Secretary/administrator of Steps: 3 Entrance Stairs-Rails: Right;Left Home Layout: One level     Bathroom Shower/Tub: Producer, television/film/video: Handicapped height Bathroom Accessibility: Yes   Home Equipment: Agricultural consultant (2 wheels);Cane - quad;Cane - single point;BSC;Shower seat;Hand held shower head;Grab bars - tub/shower;Adaptive equipment Adaptive Equipment: Reacher        Prior Functioning/Environment Prior Level of Function : Independent/Modified Independent             Mobility Comments: typically no use of AD for mobility ADLs Comments: Independent with ADLs, shares IADLs with wife        OT Problem List:        OT Treatment/Interventions:      OT Goals(Current goals can be found in the care plan section) Acute Rehab OT Goals Patient Stated Goal: go home soon OT Goal Formulation: All assessment and education complete, DC therapy  OT Frequency:      Barriers to D/C:            Co-evaluation              AM-PAC OT "6 Clicks" Daily Activity     Outcome Measure Help from another person eating meals?: None Help from another person taking care of personal grooming?: None Help from another person toileting, which includes using toliet, bedpan, or urinal?: None Help from another person bathing (including washing, rinsing, drying)?: A Little Help from another person to put on and taking off regular upper body clothing?: None Help from another person to put on and taking off regular lower body clothing?: A Little 6 Click Score: 22   End of Session Equipment Utilized During Treatment: Back brace Nurse Communication: Mobility status  Activity Tolerance: Patient tolerated treatment well Patient left: in bed;with call bell/phone within reach;with family/visitor present  OT Visit Diagnosis: Muscle weakness (generalized) (M62.81)                Time: 8657-8469 OT Time Calculation (min): 15 min Charges:  OT General Charges $OT Visit: 1 Visit OT Evaluation $OT Eval Low Complexity: 1 Low  Raynelle Fanning B, OTR/L Acute Rehab  Services Office: [phone]   Lorre Munroe 04/02/2021, 8:35 AM

## 2021-04-02 NOTE — Evaluation (Signed)
Physical Therapy Evaluation Patient Details Name: Derrick [name] MRN: [id number] DOB: 1955/06/08 Today's Date: 04/02/2021  History of Present Illness  65 y.o. male presents to [facility] hospital 03/31/2021 with 2 level DDD with neurogenic claudication. Pt underwent L4-5 OLIF and L5-S1 ALIF on 03/31/2021, with L4-S1 PLIF on 04/01/2021. PMH includes HTN, HLD, CAD s/p CABG, PVD.  Clinical Impression  Pt presents to PT with deficits in power, functional mobility, gait, balance, activity tolerance. Pt mobilizes without physical assistance at this time and is able to verbalize back precautions. Pt is limited by discomfort and fatigue, preferring to utilize walker for comfort. Pt will benefit from continued acute PT services to improve activity tolerance and restore independence. PT recommends discharge home when medically ready.       Recommendations for follow up therapy are one component of a multi-disciplinary discharge planning process, led by the attending physician.  Recommendations may be updated based on patient status, additional functional criteria and insurance authorization.  Follow Up Recommendations No PT follow up    Assistance Recommended at Discharge PRN  Functional Status Assessment Patient has had a recent decline in their functional status and demonstrates the ability to make significant improvements in function in a reasonable and predictable amount of time.  Equipment Recommendations  None recommended by PT (pt owns RW)    Recommendations for Other Services       Precautions / Restrictions Precautions Precautions: Back Precaution Booklet Issued: Yes (comment) Required Braces or Orthoses: Spinal Brace Spinal Brace: Lumbar corset;Applied in standing position Restrictions Weight Bearing Restrictions: No      Mobility  Bed Mobility Overal bed mobility: Needs Assistance Bed Mobility: Rolling;Sidelying to Sit Rolling: Modified independent (Device/Increase time) Sidelying to  sit: Supervision            Transfers Overall transfer level: Independent                      Ambulation/Gait Ambulation/Gait assistance: Supervision Gait Distance (Feet): 200 Feet Assistive device: Rolling walker (2 wheels) Gait Pattern/deviations: Step-through pattern Gait velocity: functional Gait velocity interpretation: >2.62 ft/sec, indicative of community ambulatory General Gait Details: pt with steady step-through gait, preferring to utilize walker for comfort at this time  Stairs Stairs: Yes Stairs assistance: Modified independent (Device/Increase time) Stair Management: One rail Right Number of Stairs: 10    Wheelchair Mobility    Modified Rankin (Stroke Patients Only)       Balance Overall balance assessment: Mild deficits observed, not formally tested                                           Pertinent Vitals/Pain Pain Assessment: 0-10 Pain Score: 5  Pain Location: back Pain Descriptors / Indicators: Sore Pain Intervention(s): Monitored during session    Home Living Family/patient expects to be discharged to:: Private residence Living Arrangements: Spouse/significant other Available Help at Discharge: Family;Available 24 hours/day Type of Home: House Home Access: Stairs to enter Entrance Stairs-Rails: Can reach both Entrance Stairs-Number of Steps: 3   Home Layout: One level Home Equipment: Agricultural consultant (2 wheels)      Prior Function Prior Level of Function : Independent/Modified Independent             Mobility Comments: avid golfer       Hand Dominance   Dominant Hand: Right    Extremity/Trunk Assessment   Upper  Extremity Assessment Upper Extremity Assessment: Overall WFL for tasks assessed    Lower Extremity Assessment Lower Extremity Assessment: Overall WFL for tasks assessed    Cervical / Trunk Assessment Cervical / Trunk Assessment: Back Surgery  Communication   Communication: No  difficulties  Cognition Arousal/Alertness: Awake/alert Behavior During Therapy: WFL for tasks assessed/performed Overall Cognitive Status: Within Functional Limits for tasks assessed                                          General Comments General comments (skin integrity, edema, etc.): VSS on RA    Exercises     Assessment/Plan    PT Assessment Patient needs continued PT services  PT Problem List Decreased balance;Decreased mobility;Decreased activity tolerance;Pain       PT Treatment Interventions DME instruction;Gait training;Stair training;Functional mobility training;Therapeutic activities;Therapeutic exercise;Balance training;Patient/family education    PT Goals (Current goals can be found in the Care Plan section)  Acute Rehab PT Goals Patient Stated Goal: to go home PT Goal Formulation: With patient Time For Goal Achievement: 04/07/21 Potential to Achieve Goals: Good    Frequency Min 5X/week   Barriers to discharge        Co-evaluation               AM-PAC PT "6 Clicks" Mobility  Outcome Measure Help needed turning from your back to your side while in a flat bed without using bedrails?: None Help needed moving from lying on your back to sitting on the side of a flat bed without using bedrails?: A Little Help needed moving to and from a bed to a chair (including a wheelchair)?: None Help needed standing up from a chair using your arms (e.g., wheelchair or bedside chair)?: None Help needed to walk in hospital room?: A Little Help needed climbing 3-5 steps with a railing? : None 6 Click Score: 22    End of Session Equipment Utilized During Treatment: Back brace Activity Tolerance: Patient tolerated treatment well Patient left: in bed;with call bell/phone within reach;with family/visitor present Nurse Communication: Mobility status PT Visit Diagnosis: Other abnormalities of gait and mobility (R26.89)    Time: 0947-0962 PT Time  Calculation (min) (ACUTE ONLY): 14 min   Charges:   PT Evaluation $PT Eval Low Complexity: 1 Low          Arlyss Gandy, PT, DPT Acute Rehabilitation Pager: [phone] Office [phone]   Arlyss Gandy 04/02/2021, 8:35 AM

## 2021-04-02 NOTE — Plan of Care (Signed)
  Problem: Education: Goal: Ability to verbalize activity precautions or restrictions will improve Outcome: Progressing Goal: Knowledge of the prescribed therapeutic regimen will improve Outcome: Progressing Goal: Understanding of discharge needs will improve Outcome: Progressing   Problem: Activity: Goal: Ability to avoid complications of mobility impairment will improve Outcome: Progressing Goal: Ability to tolerate increased activity will improve Outcome: Progressing Goal: Will remain free from falls Outcome: Progressing   Problem: Bowel/Gastric: Goal: Gastrointestinal status for postoperative course will improve Outcome: Progressing   

## 2021-04-02 NOTE — Progress Notes (Signed)
Lower extremity venous has been completed.   Preliminary results in CV Proc.   Camilla Skeen Carmin Dibartolo 04/02/2021 10:12 AM

## 2021-04-03 NOTE — Progress Notes (Signed)
Patient awaiting transport to his vehicle via wheelchair by NT for discharge home; in no acute distress nor complaints of pain nor discomfort; incision on his abdomen, left flank and back with honeycomb dressing were all clean, dry and intact; room was checked and accounted for all his belongings; discharge instructions concerning his medications, wound care, follow up appointment and when to call the doctor as needed were all discussed with the patient and his wife by RN and both expressed understanding on the instructions given.

## 2021-04-03 NOTE — Progress Notes (Signed)
Subjective: 2 Days Post-Op Procedure(s) (LRB): POSTERIOR LUMBAR FUSION 2 LEVEL (POSTERIOR SPINAL FUSION INTERBODY (N/A) Patient reports pain as moderate.  Tolerating reguluar diet.  Passiing "lots" of gas.  No BM yet.  Eager to go home.  Objective: Vital signs in last 24 hours: Temp:  [98.1 F (36.7 C)-99.1 F (37.3 C)] 98.1 F (36.7 C) (11/05 0357) Pulse Rate:  [74-88] 88 (11/05 0357) Resp:  [18-20] 20 (11/05 0357) BP: (137-166)/(60-75) 166/75 (11/05 0357) SpO2:  [93 %-100 %] 98 % (11/05 0357)  Intake/Output from previous day: No intake/output data recorded. Intake/Output this shift: No intake/output data recorded.  Recent Labs    04/01/21 1215  HGB 11.5*   Recent Labs    04/01/21 1215  WBC 14.3*  RBC 3.88*  HCT 34.2*  PLT 208   Recent Labs    04/01/21 1215  CREATININE 1.58*   No results for input(s): LABPT, INR in the last 72 hours.  Neurologically intact Incision: dressing C/D/I   Assessment/Plan: 2 Days Post-Op Procedure(s) (LRB): POSTERIOR LUMBAR FUSION 2 LEVEL (POSTERIOR SPINAL FUSION INTERBODY (N/A) Discharge home with home health      Toni Arthurs 04/03/2021, 7:26 AM

## 2021-04-03 NOTE — Plan of Care (Signed)
  Problem: Education: Goal: Ability to verbalize activity precautions or restrictions will improve Outcome: Completed/Met Goal: Knowledge of the prescribed therapeutic regimen will improve Outcome: Completed/Met Goal: Understanding of discharge needs will improve Outcome: Completed/Met   Problem: Activity: Goal: Ability to avoid complications of mobility impairment will improve Outcome: Completed/Met Goal: Ability to tolerate increased activity will improve Outcome: Completed/Met Goal: Will remain free from falls Outcome: Completed/Met   Problem: Bowel/Gastric: Goal: Gastrointestinal status for postoperative course will improve Outcome: Completed/Met   Problem: Clinical Measurements: Goal: Ability to maintain clinical measurements within normal limits will improve Outcome: Completed/Met Goal: Postoperative complications will be avoided or minimized Outcome: Completed/Met Goal: Diagnostic test results will improve Outcome: Completed/Met   Problem: Pain Management: Goal: Pain level will decrease Outcome: Completed/Met   Problem: Skin Integrity: Goal: Will show signs of wound healing Outcome: Completed/Met   Problem: Health Behavior/Discharge Planning: Goal: Identification of resources available to assist in meeting health care needs will improve Outcome: Completed/Met   Problem: Bladder/Genitourinary: Goal: Urinary functional status for postoperative course will improve Outcome: Completed/Met   Problem: Safety: Goal: Ability to remain free from injury will improve Outcome: Completed/Met

## 2021-04-03 NOTE — Progress Notes (Signed)
Physical Therapy Treatment Patient Details Name: Derrick [name] MRN: [id number] DOB: [birth year] Today's Date: 04/03/2021   History of Present Illness Pt is a 65 y/o male who presents to [facility] hospital 03/31/2021 with 2 level DDD with neurogenic claudication. Pt underwent L4-5 OLIF and L5-S1 ALIF on 03/31/2021, with L4-S1 PLIF on 04/01/2021. PMH includes HTN, HLD, CAD s/p CABG, PVD.    PT Comments    Pt progressing well with post-op mobility. He was able to demonstrate transfers and ambulation with gross modified independence and progressed to no AD. Pt was educated on precautions, brace application/wearing schedule, appropriate activity progression, and car transfer. Brace adjusted for optimal fit. Pt anticipates d/c home today. Will continue to follow.     Recommendations for follow up therapy are one component of a multi-disciplinary discharge planning process, led by the attending physician.  Recommendations may be updated based on patient status, additional functional criteria and insurance authorization.  Follow Up Recommendations  No PT follow up     Assistance Recommended at Discharge PRN  Equipment Recommendations  None recommended by PT (pt owns RW)    Recommendations for Other Services       Precautions / Restrictions Precautions Precautions: Fall;Back Precaution Booklet Issued: Yes (comment) Required Braces or Orthoses: Spinal Brace Spinal Brace: Lumbar corset;Applied in sitting position Restrictions Weight Bearing Restrictions: No     Mobility  Bed Mobility Overal bed mobility: Modified Independent Bed Mobility: Rolling;Sidelying to Sit;Sit to Sidelying           General bed mobility comments: HOB flat and rails lowered to simulate home environment. Light cues for optimal log roll technique.    Transfers Overall transfer level: Modified independent Equipment used: None               General transfer comment: No assist to power up to full stand. VC's for  hand placement on seated surface for safety however able to complete without difficulty.    Ambulation/Gait Ambulation/Gait assistance: Modified independent (Device/Increase time) Gait Distance (Feet): 250 Feet Assistive device: Rolling walker (2 wheels);None Gait Pattern/deviations: Step-through pattern Gait velocity: Decreased Gait velocity interpretation: >2.62 ft/sec, indicative of community ambulatory General Gait Details: Progressed to no AD and pt reports feeling comfortable without the RW. No assist required and no unsteadiness noted.   Stairs             Wheelchair Mobility    Modified Rankin (Stroke Patients Only)       Balance Overall balance assessment: Mild deficits observed, not formally tested                                          Cognition Arousal/Alertness: Awake/alert Behavior During Therapy: WFL for tasks assessed/performed Overall Cognitive Status: Within Functional Limits for tasks assessed                                          Exercises      General Comments        Pertinent Vitals/Pain Pain Assessment: 0-10 Pain Score: 5  Pain Location: back Pain Descriptors / Indicators: Sore;Operative site guarding Pain Intervention(s): Limited activity within patient's tolerance;Monitored during session;Repositioned    Home Living  Prior Function            PT Goals (current goals can now be found in the care plan section) Acute Rehab PT Goals Patient Stated Goal: Home today PT Goal Formulation: With patient Time For Goal Achievement: 04/07/21 Potential to Achieve Goals: Good Progress towards PT goals: Progressing toward goals    Frequency    Min 5X/week      PT Plan      Co-evaluation              AM-PAC PT "6 Clicks" Mobility   Outcome Measure  Help needed turning from your back to your side while in a flat bed without using bedrails?:  None Help needed moving from lying on your back to sitting on the side of a flat bed without using bedrails?: A Little Help needed moving to and from a bed to a chair (including a wheelchair)?: None Help needed standing up from a chair using your arms (e.g., wheelchair or bedside chair)?: None Help needed to walk in hospital room?: None Help needed climbing 3-5 steps with a railing? : None 6 Click Score: 23    End of Session Equipment Utilized During Treatment: Back brace Activity Tolerance: Patient tolerated treatment well Patient left: in bed;with call bell/phone within reach;with family/visitor present Nurse Communication: Mobility status PT Visit Diagnosis: Other abnormalities of gait and mobility (R26.89)     Time: 5427-0623 PT Time Calculation (min) (ACUTE ONLY): 20 min  Charges:  $Gait Training: 8-22 mins                     Conni Slipper, PT, DPT Acute Rehabilitation Services Pager: [phone] Office: [phone]    Marylynn Pearson 04/03/2021, 10:28 AM

## 2021-04-05 NOTE — Discharge Summary (Signed)
Patient ID: Derrick [name] MRN: [id number] DOB/AGE: 1955-09-20 65 y.o.  Admit date: 03/31/2021 Discharge date: 04/03/2021  Admission Diagnoses:  Active Problems:   S/P lumbar fusion   Discharge Diagnoses:  Active Problems:   S/P lumbar fusion  status post Procedure(s): POSTERIOR LUMBAR FUSION 2 LEVEL (POSTERIOR SPINAL FUSION INTERBODY  Past Medical History:  Diagnosis Date   Chronic kidney disease    "mild"-from taking meloxicam for many years.   Complication of anesthesia    "I was slow to wake up"   Coronary artery disease 2006   stents x2=LAD LCX; totally occluded RCA b. CABG 03/02/17 x2 (left internal mammary artery to LAD, Y-graft right mammary from left mammary to OM).// Myoview 9/22: Inferior infarct, no ischemia, EF 61; low risk // Echocardiogram 9/22: EF 55-60, no RWMA, GLS -24.5%, normal RVSF, RVSP 30.7, mild LAE, trivial MR, trivial AI, mild AV sclerosis w/o AS   Dyslipidemia    GERD (gastroesophageal reflux disease)    History of bronchitis    Hyperlipidemia    statin intolerant   Hyperparathyroidism    Hypertension    Insomnia    Metatarsalgia    Neuropathy    OA (osteoarthritis)    Peripheral vascular disease (HCC)    Seasonal allergies    Urinary frequency    Wears glasses     Surgeries: Procedure(s): POSTERIOR LUMBAR FUSION 2 LEVEL (POSTERIOR SPINAL FUSION INTERBODY on 04/01/2021   Consultants:   Discharged Condition: Improved  Hospital Course: Derrick [name] is an 65 y.o. male who was admitted 03/31/2021 for operative treatment of Lumbar disc disease with neuropathic leg pain. Patient failed conservative treatments (please see the history and physical for the specifics) and had severe unremitting pain that affects sleep, daily activities and work/hobbies. After pre-op clearance, the patient was taken to the operating room on 04/01/2021 and underwent  Procedure(s): POSTERIOR LUMBAR FUSION 2 LEVEL (POSTERIOR SPINAL FUSION INTERBODY.    Patient was  given perioperative antibiotics:  Anti-infectives (From admission, onward)    Start     Dose/Rate Route Frequency Ordered Stop   04/01/21 1300  ceFAZolin (ANCEF) IVPB 1 g/50 mL premix        1 g 100 mL/hr over 30 Minutes Intravenous Every 8 hours 04/01/21 1158 04/01/21 2149   03/31/21 1630  ceFAZolin (ANCEF) IVPB 1 g/50 mL premix        1 g 100 mL/hr over 30 Minutes Intravenous Every 8 hours 03/31/21 1539 04/01/21 0024   03/31/21 0656  ceFAZolin (ANCEF) IVPB 2g/100 mL premix        2 g 200 mL/hr over 30 Minutes Intravenous 30 min pre-op 03/31/21 0656 03/31/21 0900        Patient was given sequential compression devices and early ambulation to prevent DVT.   Patient benefited maximally from hospital stay and there were no complications. At the time of discharge, the patient was urinating/moving their bowels without difficulty, tolerating a regular diet, pain is controlled with oral pain medications and they have been cleared by PT/OT.   Recent vital signs: No data found.   Recent laboratory studies: No results for input(s): WBC, HGB, HCT, PLT, NA, K, CL, CO2, BUN, CREATININE, GLUCOSE, INR, CALCIUM in the last 72 hours.  Invalid input(s): PT, 2   Discharge Medications:   Allergies as of 04/03/2021       Reactions   Codeine Itching, Other (See Comments)   Atorvastatin Other (See Comments)   Muscle aches Muscle aches   Crestor [employer]  Calcium]    Muscle aches   Meloxicam Other (See Comments)   Can not take due to kidneys   Statins Other (See Comments)   myalgia        Medication List     STOP taking these medications    ASPIRIN 81 PO   calcium carbonate 1500 (600 Ca) MG Tabs tablet Commonly known as: OSCAL   Leqvio 284 MG/1.5ML Sosy injection Generic drug: inclisiran   Magnesium 250 MG Tabs   MAGNESIUM PO   OMEGA-3 EPA FISH OIL PO   traZODone 50 MG tablet Commonly known as: DESYREL       TAKE these medications    acetaminophen 650 MG CR  tablet Commonly known as: TYLENOL Take 1,300 mg by mouth in the morning and at bedtime.   amLODipine 5 MG tablet Commonly known as: NORVASC Take 5 mg by mouth daily.   calcitRIOL 0.5 MCG capsule Commonly known as: ROCALTROL Take 0.5 mcg by mouth daily.   enoxaparin 40 MG/0.4ML injection Commonly known as: LOVENOX Inject 0.4 mLs (40 mg total) into the skin daily for 10 days. 10 day supply 1 injection per day   ezetimibe 10 MG tablet Commonly known as: ZETIA Take 10 mg by mouth every evening.   famotidine 20 MG tablet Commonly known as: PEPCID Take 20 mg by mouth 2 (two) times daily.   fenofibrate 160 MG tablet Take 160 mg by mouth every evening.   gabapentin 300 MG capsule Commonly known as: NEURONTIN Take 600 mg by mouth at bedtime.   hydrochlorothiazide 25 MG tablet Commonly known as: HYDRODIURIL Take 25 mg by mouth every evening.   ipratropium 0.03 % nasal spray Commonly known as: ATROVENT Place 1 spray into both nostrils 2 (two) times daily as needed for rhinitis.   losartan 100 MG tablet Commonly known as: COZAAR Take 100 mg by mouth every evening.   methocarbamol 500 MG tablet Commonly known as: Robaxin Take 1 tablet (500 mg total) by mouth every 8 (eight) hours as needed for up to 5 days for muscle spasms.   nitroGLYCERIN 0.4 MG SL tablet Commonly known as: NITROSTAT Place 1 tablet (0.4 mg total) under the tongue every 5 (five) minutes as needed for chest pain.   ondansetron 4 MG tablet Commonly known as: Zofran Take 1 tablet (4 mg total) by mouth every 8 (eight) hours as needed for nausea or vomiting.   oxyCODONE-acetaminophen 10-325 MG tablet Commonly known as: Percocet Take 1 tablet by mouth every 6 (six) hours as needed for up to 5 days for pain.   potassium chloride 10 MEQ tablet Commonly known as: KLOR-CON Take 1 tablet (10 mEq total) by mouth daily.   pravastatin 20 MG tablet Commonly known as: PRAVACHOL Take 20 mg by mouth every  evening.   Systane Balance 0.6 % Soln Generic drug: Propylene Glycol Place 1 drop into both eyes daily as needed (dry eyes).   tamsulosin 0.4 MG Caps capsule Commonly known as: FLOMAX Take 0.4 mg by mouth every evening.   vitamin B-12 500 MCG tablet Commonly known as: CYANOCOBALAMIN Take 500 mcg by mouth daily.       ASK your doctor about these medications    metoprolol tartrate 25 MG tablet Commonly known as: LOPRESSOR TAKE 1 TABLET TWICE DAILY Ask about: Which instructions should I use?               Discharge Care Instructions  (From admission, onward)  Start     Ordered   04/03/21 0000  Weight bearing as tolerated       Question Answer Comment  Laterality bilateral   Extremity Lower      04/03/21 0730            Diagnostic Studies: DG Lumbar Spine 2-3 Views  Result Date: 03/31/2021 CLINICAL DATA:  L4-L5 OLIF L5-S1 ALIF EXAM: LUMBAR SPINE - 2-3 VIEW COMPARISON:  None. FINDINGS: Four intraoperative fluoroscopic images were provided for interpretation. The submitted images demonstrate extensive atherosclerotic calcification of the abdominal aorta. Disc spacer noted at L4-L5 and L5-S1. Bilateral common iliac artery stents are seen. IMPRESSION: Intraoperative fluoroscopic images of the lumbar spine as above. Electronically Signed   By: Acquanetta Belling M.D.   On: 03/31/2021 13:15   DG Lumbar Spine Complete  Result Date: 04/01/2021 CLINICAL DATA:  L4-S1 fusion EXAM: LUMBAR SPINE - COMPLETE 4+ VIEW COMPARISON:  None. FINDINGS: There are rods and pedicle screws at L4-S1. Interbody spacer devices are present at these levels. IMPRESSION: Post L4-S1 fusion. Electronically Signed   By: Guadlupe Spanish M.D.   On: 04/01/2021 10:40   DG C-Arm 1-60 Min-No Report  Result Date: 04/01/2021 Fluoroscopy was utilized by the requesting physician.  No radiographic interpretation.   DG C-Arm 1-60 Min-No Report  Result Date: 04/01/2021 Fluoroscopy was utilized by the  requesting physician.  No radiographic interpretation.   DG C-Arm 1-60 Min-No Report  Result Date: 03/31/2021 Fluoroscopy was utilized by the requesting physician.  No radiographic interpretation.   DG C-Arm 1-60 Min-No Report  Result Date: 03/31/2021 Fluoroscopy was utilized by the requesting physician.  No radiographic interpretation.   DG C-Arm 1-60 Min-No Report  Result Date: 03/31/2021 Fluoroscopy was utilized by the requesting physician.  No radiographic interpretation.   DG C-Arm 1-60 Min-No Report  Result Date: 03/31/2021 Fluoroscopy was utilized by the requesting physician.  No radiographic interpretation.   VAS [place] LOWER EXTREMITY VENOUS (DVT)  Result Date: 04/02/2021  Lower Venous DVT Study Patient Name:  Derrick [name]  Date of Exam:   04/02/2021 Medical Rec #: [id number]         Accession #:    [id number] Date of Birth: January 01, 1956          Patient Gender: M Patient Age:   27 years Exam Location:  [facility] Procedure:      VAS [place] LOWER EXTREMITY VENOUS (DVT) Referring Phys: Marchelle Folks Darwyn Ponzo --------------------------------------------------------------------------------  Other Indications: S/P abdominal exposure requiring manipulation of vasculature. Comparison Study: no prior Performing Technologist: [place] Ponder RVS  Examination Guidelines: A complete evaluation includes B-mode imaging, spectral Doppler, color Doppler, and power Doppler as needed of all accessible portions of each vessel. Bilateral testing is considered an integral part of a complete examination. Limited examinations for reoccurring indications may be performed as noted. The reflux portion of the exam is performed with the patient in reverse Trendelenburg.  +---------+---------------+---------+-----------+----------+-------------------+ RIGHT    CompressibilityPhasicitySpontaneityPropertiesThrombus Aging      +---------+---------------+---------+-----------+----------+-------------------+ CFV       Full           Yes      Yes                                      +---------+---------------+---------+-----------+----------+-------------------+ SFJ      Full                                                             +---------+---------------+---------+-----------+----------+-------------------+  FV Prox  Full                                                             +---------+---------------+---------+-----------+----------+-------------------+ FV Mid   Full                                                             +---------+---------------+---------+-----------+----------+-------------------+ FV DistalFull                                                             +---------+---------------+---------+-----------+----------+-------------------+ PFV      Full                                                             +---------+---------------+---------+-----------+----------+-------------------+ POP      Full           No       Yes                                      +---------+---------------+---------+-----------+----------+-------------------+ PTV      Full                                                             +---------+---------------+---------+-----------+----------+-------------------+ PERO                                                  Not well visualized +---------+---------------+---------+-----------+----------+-------------------+   +---------+---------------+---------+-----------+----------+--------------+ LEFT     CompressibilityPhasicitySpontaneityPropertiesThrombus Aging +---------+---------------+---------+-----------+----------+--------------+ CFV      Full           Yes      Yes                                 +---------+---------------+---------+-----------+----------+--------------+ SFJ      Full                                                         +---------+---------------+---------+-----------+----------+--------------+ FV Prox  Full                                                        +---------+---------------+---------+-----------+----------+--------------+  FV Mid   Full                                                        +---------+---------------+---------+-----------+----------+--------------+ FV DistalFull                                                        +---------+---------------+---------+-----------+----------+--------------+ PFV      Full                                                        +---------+---------------+---------+-----------+----------+--------------+ POP      Full           Yes      Yes                                 +---------+---------------+---------+-----------+----------+--------------+ PTV      Full                                                        +---------+---------------+---------+-----------+----------+--------------+ PERO     Full                                                        +---------+---------------+---------+-----------+----------+--------------+     Summary: BILATERAL: - No evidence of deep vein thrombosis seen in the lower extremities, bilaterally. -No evidence of popliteal cyst, bilaterally.   *See table(s) above for measurements and observations. Electronically signed by Sherald Hess MD on 04/02/2021 at 12:38:41 PM.    Final    DG OR LOCAL ABDOMEN  Result Date: 03/31/2021 CLINICAL DATA:  Intraoperative film for retained surgical materials. EXAM: OR LOCAL ABDOMEN COMPARISON:  Earlier film, same date. FINDINGS: New interbody fusion device at L4-5 appears to be in good position without complicating features. Stable interbody fusion device at L5-S1. Three small skin staples are noted on the left and are not unexpected. IMPRESSION: New interbody fusion device at L4-5 in good position without complicating features. No unexpected  surgical instruments. Electronically Signed   By: Rudie Meyer M.D.   On: 03/31/2021 13:01   DG OR LOCAL ABDOMEN  Result Date: 03/31/2021 CLINICAL DATA:  Anterior lumbar fusion at L5-S1 level, evaluate for foreign bodies EXAM: OR LOCAL ABDOMEN COMPARISON:  None. FINDINGS: There is evidence of anterior surgical fusion at L5-S1 level. There are no definite opaque foreign bodies. Scattered vascular calcifications are seen. Severe degenerative changes are noted in lumbar spine at L4-L5 and L5-S1 levels. IMPRESSION: Surgical fusion at L5-S1 level.  There are no opaque foreign bodies. Findings were relayed to the surgeon by telephone call at 10:38  a.m. on 03/31/2021. Electronically Signed   By: Ernie Avena M.D.   On: 03/31/2021 10:41    Discharge Instructions     Call MD / Call 911   Complete by: As directed    If you experience chest pain or shortness of breath, CALL 911 and be transported to the hospital emergency room.  If you develope a fever above 101 F, pus (white drainage) or increased drainage or redness at the wound, or calf pain, call your surgeon's office.   Constipation Prevention   Complete by: As directed    Drink plenty of fluids.  Prune juice may be helpful.  You may use a stool softener, such as Colace (over the counter) 100 mg twice a day.  Use MiraLax (over the counter) for constipation as needed.   Diet - low sodium heart healthy   Complete by: As directed    Incentive spirometry RT   Complete by: As directed    Incentive spirometry RT   Complete by: As directed    Increase activity slowly as tolerated   Complete by: As directed    Post-operative opioid taper instructions:   Complete by: As directed    POST-OPERATIVE OPIOID TAPER INSTRUCTIONS: It is important to wean off of your opioid medication as soon as possible. If you do not need pain medication after your surgery it is ok to stop day one. Opioids include: Codeine, Hydrocodone(Norco, Vicodin),  Oxycodone(Percocet, oxycontin) and hydromorphone amongst others.  Long term and even short term use of opiods can cause: Increased pain response Dependence Constipation Depression Respiratory depression And more.  Withdrawal symptoms can include Flu like symptoms Nausea, vomiting And more Techniques to manage these symptoms Hydrate well Eat regular healthy meals Stay active Use relaxation techniques(deep breathing, meditating, yoga) Do Not substitute Alcohol to help with tapering If you have been on opioids for less than two weeks and do not have pain than it is ok to stop all together.  Plan to wean off of opioids This plan should start within one week post op of your joint replacement. Maintain the same interval or time between taking each dose and first decrease the dose.  Cut the total daily intake of opioids by one tablet each day Next start to increase the time between doses. The last dose that should be eliminated is the evening dose.      Weight bearing as tolerated   Complete by: As directed    Laterality: bilateral   Extremity: Lower   Wear your brace when out of bed.        Follow-up Information     Venita Lick, MD. Schedule an appointment as soon as possible for a visit in 2 week(s).   Specialty: Orthopedic Surgery Why: As needed, If symptoms worsen, For suture removal, For wound re-check Contact information: [address] [place] [address] [phone]                 Discharge Plan:  discharge to home  Disposition: stable   Signed: Rhodia Albright for [facility] PA-C [facility] [phone] 04/05/2021, 3:33 PM

## 2021-04-07 ENCOUNTER — Telehealth: Payer: Self-pay

## 2021-04-07 ENCOUNTER — Other Ambulatory Visit: Payer: Self-pay

## 2021-04-07 DIAGNOSIS — E785 Hyperlipidemia, unspecified: Secondary | ICD-10-CM

## 2021-04-07 MED ORDER — INCLISIRAN SODIUM 284 MG/1.5ML ~~LOC~~ SOSY: 284.0000 mg | PREFILLED_SYRINGE | Freq: Once | SUBCUTANEOUS | Status: AC

## 2021-04-07 NOTE — Telephone Encounter (Signed)
Lvm to schedule leqvio inj for 3 months

## 2021-04-08 ENCOUNTER — Other Ambulatory Visit: Payer: Self-pay

## 2021-04-08 DIAGNOSIS — I779 Disorder of arteries and arterioles, unspecified: Secondary | ICD-10-CM

## 2021-04-29 ENCOUNTER — Other Ambulatory Visit (HOSPITAL_COMMUNITY): Payer: Self-pay | Admitting: Vascular Surgery

## 2021-04-29 DIAGNOSIS — I739 Peripheral vascular disease, unspecified: Secondary | ICD-10-CM

## 2021-04-30 ENCOUNTER — Ambulatory Visit (INDEPENDENT_AMBULATORY_CARE_PROVIDER_SITE_OTHER): Payer: Medicare Other | Admitting: Physician Assistant

## 2021-04-30 ENCOUNTER — Ambulatory Visit (INDEPENDENT_AMBULATORY_CARE_PROVIDER_SITE_OTHER)
Admission: RE | Admit: 2021-04-30 | Discharge: 2021-04-30 | Disposition: A | Discharge status: Home / Self Care | Payer: Medicare Other | Source: Ambulatory Visit | Attending: Physician Assistant | Admitting: Physician Assistant

## 2021-04-30 ENCOUNTER — Ambulatory Visit (HOSPITAL_COMMUNITY)
Admission: RE | Admit: 2021-04-30 | Discharge: 2021-04-30 | Disposition: A | Discharge status: Home / Self Care | Payer: Medicare Other | Source: Ambulatory Visit | Attending: Physician Assistant | Admitting: Physician Assistant

## 2021-04-30 ENCOUNTER — Other Ambulatory Visit: Payer: Self-pay

## 2021-04-30 VITALS — BP 132/75 | HR 62 | Temp 98.3°F | Resp 20 | Ht 74.0 in | Wt 213.0 lb

## 2021-04-30 DIAGNOSIS — I779 Disorder of arteries and arterioles, unspecified: Secondary | ICD-10-CM | POA: Diagnosis present

## 2021-04-30 DIAGNOSIS — I739 Peripheral vascular disease, unspecified: Secondary | ICD-10-CM | POA: Diagnosis present

## 2021-04-30 NOTE — Progress Notes (Signed)
[facility] [place] HISTORY AND PHYSICAL   History of Present Illness:  Patient is a 65 y.o. year old male who presents for evaluation of PAD s/p  right common femoral to below-knee popliteal bypass as well as a left common femoral to PT bypass by Dr. Darrick Penna in 2016.  He recently underwent anterior lumbar exposure for both anterior and posterior spinal fusion by Dr. Chestine Spore and dr. Shon Baton on 03/31/21.  He is doing well over all.  He denise claudication, rest pain or non healing wounds.  He is tolerating PO's without N/V.  He denise fever and chills.  He is medically managed with anti hypertensive's and Statin.    Past Medical History:  Diagnosis Date   Chronic kidney disease    "mild"-from taking meloxicam for many years.   Complication of anesthesia    "I was slow to wake up"   Coronary artery disease 2006   stents x2=LAD LCX; totally occluded RCA b. CABG 03/02/17 x2 (left internal mammary artery to LAD, Y-graft right mammary from left mammary to OM).// Myoview 9/22: Inferior infarct, no ischemia, EF 61; low risk // Echocardiogram 9/22: EF 55-60, no RWMA, GLS -24.5%, normal RVSF, RVSP 30.7, mild LAE, trivial MR, trivial AI, mild AV sclerosis w/o AS   Dyslipidemia    GERD (gastroesophageal reflux disease)    History of bronchitis    Hyperlipidemia    statin intolerant   Hyperparathyroidism    Hypertension    Insomnia    Metatarsalgia    Neuropathy    OA (osteoarthritis)    Peripheral vascular disease (HCC)    Seasonal allergies    Urinary frequency    Wears glasses     Past Surgical History:  Procedure Laterality Date   ABDOMINAL EXPOSURE N/A 03/31/2021   Procedure: ABDOMINAL EXPOSURE;  Surgeon: Cephus Shelling, MD;  Location: [facility] OR;  Service: Vascular;  Laterality: N/A;   ANTERIOR LUMBAR FUSION N/A 03/31/2021   Procedure: ANTERIOR LUMBAR FUSION 1 LEVEL LUMBAR FIVE TO SACRAL ONE;  Surgeon: Venita Lick, MD;  Location: [facility] OR;  Service: Orthopedics;   Laterality: N/A;  3 C-BED  LEFT TAP BLOCK WITH EXPAREL   CARDIAC CATHETERIZATION  10/28/2004   circ and mid LAD chyper stents   CERVICAL FUSION  2007,2009   CERVICAL SPINE SURGERY  2004   COLONOSCOPY     CORONARY ANGIOPLASTY  2006   stents placed    CORONARY ARTERY BYPASS GRAFT N/A 03/02/2017   Procedure: CORONARY ARTERY BYPASS GRAFTING (CABG), ON PUMP, TIMES TWO, USING BILATERAL MAMMARY ARTERIES;  Surgeon: Loreli Slot, MD;  Location: [facility] OR;  Service: Open Heart Surgery;  Laterality: N/A;   ELBOW ARTHROPLASTY  1994   right   ENDARTERECTOMY FEMORAL Left 03/17/2015   Procedure: ENDARTERECTOMY COMMON FEMORAL WITH PROFUNDOPLASTY;  Surgeon: Sherren Kerns, MD;  Location: [facility] OR;  Service: Vascular;  Laterality: Left;   FEMORAL-POPLITEAL BYPASS GRAFT Left 03/17/2015   Procedure: BYPASS GRAFT FEMORAL-POPLITEAL ARTERY-LEFT LEG AND POSTERIOR TIBIAL SEQUENTIAL GRAFT TO BELOW KNEE POPLITEAL ARTERY;  Surgeon: Sherren Kerns, MD;  Location: [facility] OR;  Service: Vascular;  Laterality: Left;   FEMORAL-POPLITEAL BYPASS GRAFT Right 05/11/2015   Procedure:  RIGHT FEMORAL-BELOW THE KNEE POPLITEAL ARTERY BYPASS GRAFT USING NON REVERSE RIGHT GREATER SAPHENOUS VEIN;  Surgeon: Sherren Kerns, MD;  Location: [facility] OR;  Service: Vascular;  Laterality: Right;   INCISION AND DRAINAGE ABSCESS POSTERIOR CERVICALSPINE  2009   INTRAOPERATIVE ARTERIOGRAM Left 03/17/2015   Procedure: INTRA  OPERATIVE ARTERIOGRAM X2;  Surgeon: Sherren Kerns, MD;  Location: [facility] OR;  Service: Vascular;  Laterality: Left;   INTRAOPERATIVE ARTERIOGRAM Right 05/11/2015   Procedure: INTRA OPERATIVE ARTERIOGRAM;  Surgeon: Sherren Kerns, MD;  Location: [facility] OR;  Service: Vascular;  Laterality: Right;   LEFT HEART CATH AND CORONARY ANGIOGRAPHY N/A 03/01/2017   Procedure: LEFT HEART CATH AND CORONARY ANGIOGRAPHY;  Surgeon: Kathleene Hazel, MD;  Location: [facility] INVASIVE [facility] LAB;  Service: Cardiovascular;  Laterality: N/A;   PATCH  ANGIOPLASTY Left 03/17/2015   Procedure: VEIN PATCH ANGIOPLASTY COMMON FEMORAL ARTERY;  Surgeon: Sherren Kerns, MD;  Location: [facility] OR;  Service: Vascular;  Laterality: Left;   PERIPHERAL VASCULAR CATHETERIZATION N/A 01/16/2015   Procedure: Abdominal Aortogram;  Surgeon: Sherren Kerns, MD;  Location: [facility] INVASIVE CV LAB;  Service: Cardiovascular;  Laterality: N/A;   SHOULDER OPEN ROTATOR CUFF REPAIR Right 04/23/2013   Procedure: RIGHT TWO TENDON ROTATOR CUFF REPAIR  SUBACROMIAL DECOMPRESSION AND OPEN DISTAL CLAVICLE RESECTION;  Surgeon: Wyn Forster., MD;  Location: [facility];  Service: Orthopedics;  Laterality: Right;   stents placed   2006   TEE WITHOUT CARDIOVERSION N/A 03/02/2017   Procedure: TRANSESOPHAGEAL ECHOCARDIOGRAM (TEE);  Surgeon: Loreli Slot, MD;  Location: [facility] OR;  Service: Open Heart Surgery;  Laterality: N/A;   VEIN HARVEST Right 05/11/2015   Procedure: WITH NON REVERSE RIGHT GREATER SAPHENOUS VEIN HARVEST;  Surgeon: Sherren Kerns, MD;  Location: [facility] OR;  Service: Vascular;  Laterality: Right;    ROS:   General:  No weight loss, Fever, chills  HEENT: No recent headaches, no nasal bleeding, no visual changes, no sore throat  Neurologic: No dizziness, blackouts, seizures. No recent symptoms of stroke or mini- stroke. No recent episodes of slurred speech, or temporary blindness.  Cardiac: No recent episodes of chest pain/pressure, no shortness of breath at rest.  No shortness of breath with exertion.  Denies history of atrial fibrillation or irregular heartbeat  Vascular: No history of rest pain in feet.  No history of claudication.  No history of non-healing ulcer, No history of DVT   Pulmonary: No home oxygen, no productive cough, no hemoptysis,  No asthma or wheezing  Musculoskeletal:  [ ]  Arthritis, [ ]  Low back pain,  [ ]  Joint pain  Hematologic:No history of hypercoagulable state.  No history of easy bleeding.  No history of  anemia  Gastrointestinal: No hematochezia or melena,  No gastroesophageal reflux, no trouble swallowing  Urinary: [ ]  chronic Kidney disease, [ ]  on HD - [ ]  MWF or [ ]  TTHS, [ ]  Burning with urination, [ ]  Frequent urination, [ ]  Difficulty urinating;   Skin: No rashes  Psychological: No history of anxiety,  No history of depression  Social History Social History   Tobacco Use   Smoking status: Former    Types: Cigarettes    Quit date: 05/30/1989    Years since quitting: 31.9   Smokeless tobacco: Never  Vaping Use   Vaping Use: Never used  Substance Use Topics   Alcohol use: Yes    Comment: 1 beer every 6 months   Drug use: Never    Family History Family History  Problem Relation Age of Onset   Arthritis Mother    Varicose Veins Mother    Lung cancer Father    COPD Father        Lung   Cancer Brother         COPD Brother    Cancer Brother        Prostate    Allergies  Allergies  Allergen Reactions   Codeine Itching and Other (See Comments)   Atorvastatin Other (See Comments)    Muscle aches Muscle aches   Crestor [Rosuvastatin Calcium]     Muscle aches   Meloxicam Other (See Comments)    Can not take due to kidneys   Statins Other (See Comments)    myalgia     Current Outpatient Medications  Medication Sig Dispense Refill   acetaminophen (TYLENOL) 650 MG CR tablet Take 1,300 mg by mouth in the morning and at bedtime.     amLODipine (NORVASC) 5 MG tablet Take 5 mg by mouth daily.     calcitRIOL (ROCALTROL) 0.5 MCG capsule Take 0.5 mcg by mouth daily.     ezetimibe (ZETIA) 10 MG tablet Take 10 mg by mouth every evening.     famotidine (PEPCID) 20 MG tablet Take 20 mg by mouth 2 (two) times daily.     fenofibrate 160 MG tablet Take 160 mg by mouth every evening.     gabapentin (NEURONTIN) 300 MG capsule Take 600 mg by mouth at bedtime.     hydrochlorothiazide (HYDRODIURIL) 25 MG tablet Take 25 mg by mouth every evening.     ipratropium  (ATROVENT) 0.03 % nasal spray Place 1 spray into both nostrils 2 (two) times daily as needed for rhinitis.     losartan (COZAAR) 100 MG tablet Take 100 mg by mouth every evening.      metoprolol tartrate (LOPRESSOR) 25 MG tablet TAKE 1 TABLET TWICE DAILY 180 tablet 2   nitroGLYCERIN (NITROSTAT) 0.4 MG SL tablet Place 1 tablet (0.4 mg total) under the tongue every 5 (five) minutes as needed for chest pain. 75 tablet 1   ondansetron (ZOFRAN) 4 MG tablet Take 1 tablet (4 mg total) by mouth every 8 (eight) hours as needed for nausea or vomiting. 20 tablet 0   potassium chloride (KLOR-CON) 10 MEQ tablet Take 1 tablet (10 mEq total) by mouth daily. 90 tablet 3   pravastatin (PRAVACHOL) 20 MG tablet Take 20 mg by mouth every evening.     Propylene Glycol (SYSTANE BALANCE) 0.6 % SOLN Place 1 drop into both eyes daily as needed (dry eyes).     tamsulosin (FLOMAX) 0.4 MG CAPS capsule Take 0.4 mg by mouth every evening.     vitamin B-12 (CYANOCOBALAMIN) 500 MCG tablet Take 500 mcg by mouth daily.     enoxaparin (LOVENOX) 40 MG/0.4ML injection Inject 0.4 mLs (40 mg total) into the skin daily for 10 days. 10 day supply 1 injection per day 4 mL 0   Current Facility-Administered Medications  Medication Dose Route Frequency Provider Last Rate Last Admin   [START ON 05/26/2021] inclisiran (LEQVIO) injection 284 mg  284 mg Subcutaneous Once Nahser, Deloris Ping, MD        Physical Examination  Vitals:   04/30/21 0904  BP: 132/75  Pulse: 62  Resp: 20  Temp: 98.3 F (36.8 C)  SpO2: 99%  Weight: 213 lb (96.6 kg)  Height:  (1.88 m)    Body mass index is 27.35 kg/m.  General:  Alert and oriented, no acute distress HEENT: Normal Neck: No bruit or JVD Pulmonary: Clear to auscultation bilaterally Cardiac: Regular Rate and Rhythm without murmur Abdomen: Soft, non-tender, non-distended, no mass, well healing incision left LQ abdomin, no erythema and + BS Skin: No rash Extremity Pulses:  2+ radial,  brachial, femoral, dorsalis pedis,  pulses bilaterally Musculoskeletal: No deformity or edema  Neurologic: Upper and lower extremity motor 5/5 and symmetric  DATA:     ABI Findings:  +---------+------------------+-----+---------+--------+  Right    Rt Pressure (mmHg)IndexWaveform Comment   +---------+------------------+-----+---------+--------+  Brachial 180                                       +---------+------------------+-----+---------+--------+  PTA      202               1.10 triphasic          +---------+------------------+-----+---------+--------+  DP       233               1.27 triphasic          +---------+------------------+-----+---------+--------+  Cletis Media               0.61                    +---------+------------------+-----+---------+--------+   +---------+------------------+-----+---------+-------+  Left     Lt Pressure (mmHg)IndexWaveform Comment  +---------+------------------+-----+---------+-------+  Brachial 183                                      +---------+------------------+-----+---------+-------+  PTA      177               0.97 triphasic         +---------+------------------+-----+---------+-------+  DP       183               1.00 triphasic         +---------+------------------+-----+---------+-------+  Great Toe131               0.72                   +---------+------------------+-----+---------+-------+   +-------+-----------+-----------+------------+------------+  ABI/TBIToday's ABIToday's TBIPrevious ABIPrevious TBI  +-------+-----------+-----------+------------+------------+  Right  1.27       0.61       1.25        0.72          +-------+-----------+-----------+------------+------------+  Left   1.00       0.72       1.15        0.63          +-------+-----------+-----------+------------+------------+           Bilateral ABIs appear essentially  unchanged compared to prior study on  04/30/2020.     Summary:  Right: Resting right ankle-brachial index is within normal range. No  evidence of significant right lower extremity arterial disease. The right  toe-brachial index is abnormal.   Left: Resting left ankle-brachial index is within normal range. No  evidence of significant left lower extremity arterial disease. The left  toe-brachial index is normal.    ASSESSMENT:  PAD s/p right common femoral to below-knee popliteal bypass as well as a left common femoral to PT bypass by Dr. Darrick Penna in 2016.  Palpable pedal pulses B asymptomatic for claudication, rest pain and non healing wounds. His ABI, stent duplex and arterial duplex are unchanged and patent throughout.  He is increasing his mobility slowly since he is recovery from recent spinal surgery.     PLAN: He will  f/u in 1 year for repeat studies.  If he develops symptoms of ischemia he will call our office.     Mosetta Pigeon PA-C [facility] [place] Office: [phone]  MD on call Edilia Bo

## 2021-05-05 ENCOUNTER — Other Ambulatory Visit: Payer: Self-pay | Admitting: Cardiovascular Disease

## 2021-05-10 ENCOUNTER — Encounter (HOSPITAL_COMMUNITY): Payer: Medicare Other

## 2021-05-10 NOTE — Telephone Encounter (Incomplete)
j

## 2021-05-25 ENCOUNTER — Telehealth: Payer: Self-pay | Admitting: Cardiovascular Disease

## 2021-05-25 ENCOUNTER — Other Ambulatory Visit: Payer: Self-pay

## 2021-05-25 DIAGNOSIS — I200 Unstable angina: Secondary | ICD-10-CM

## 2021-05-25 DIAGNOSIS — G720 Drug-induced myopathy: Secondary | ICD-10-CM

## 2021-05-25 DIAGNOSIS — E785 Hyperlipidemia, unspecified: Secondary | ICD-10-CM

## 2021-05-25 NOTE — Telephone Encounter (Signed)
Called and lmomed the pt that we can now reschedule his injection for leqvio and to call us back asap

## 2021-05-25 NOTE — Telephone Encounter (Signed)
Pt c/o medication issue:  1. Name of Medication:  inclisiran (LEQVIO) injection 284 mg  2. How are you currently taking this medication (dosage and times per day)?  As prescribed   3. Are you having a reaction (difficulty breathing--STAT)?  No   4. What is your medication issue?   Patient was scheduled to receive Leqvio injection on tomorrow, 12/28, with the Infusion Center at [facility], but they called and cancelled due to not having the medicationin. They advised him to call cardiology to see if we have any available for patient's injection. Please advise.

## 2021-05-25 NOTE — Telephone Encounter (Signed)
Your Leqvio injection is scheduled on 12/29 at 9am. Please arrive at [facility] and enter the hospital through Entrance A (the [employer]) that's located at [employer] 15 minutes before your scheduled injection time. Walk in to the registration desk and let them know that you are scheduled for an injection in the infusion center. They will register you and take you to your appointment.   Derrick [name] is a subcutaneous injection that will lower your LDL cholesterol by 50%. If this is your first injection, your next injection will be due in 3 months. If this is NOT your first injection, your next injection will be due in 6 months. If you have not heard from [facility] to schedule your next appointment within 2 weeks of your next anticipated injection, please call [facility] to schedule this at:                #[phone] - if you are seen at the [facility] office               [phone] - if you are seen at the [facility] office

## 2021-05-25 NOTE — Telephone Encounter (Signed)
I spoke with patient. Advised that we cannot order medication. Sounds like medication was delayed due to winter weather. I will send message to the buyers at [facility] and will follow up with him when I know more.  Addendum: confirmed shipment was delayed due to weather. They are hoping it comes in today. Will reschedule patient once medication received.

## 2021-05-26 ENCOUNTER — Encounter (HOSPITAL_COMMUNITY): Payer: Medicare Other

## 2021-05-27 ENCOUNTER — Encounter (HOSPITAL_COMMUNITY)
Admission: RE | Admit: 2021-05-27 | Discharge: 2021-05-27 | Disposition: A | Discharge status: Home / Self Care | Payer: Medicare Other | Source: Ambulatory Visit | Attending: Cardiovascular Disease | Admitting: Cardiovascular Disease

## 2021-05-27 ENCOUNTER — Other Ambulatory Visit: Payer: Self-pay

## 2021-05-27 DIAGNOSIS — T466X5A Adverse effect of antihyperlipidemic and antiarteriosclerotic drugs, initial encounter: Secondary | ICD-10-CM | POA: Diagnosis present

## 2021-05-27 DIAGNOSIS — G720 Drug-induced myopathy: Secondary | ICD-10-CM | POA: Insufficient documentation

## 2021-05-27 DIAGNOSIS — E785 Hyperlipidemia, unspecified: Secondary | ICD-10-CM | POA: Diagnosis present

## 2021-05-27 DIAGNOSIS — I200 Unstable angina: Secondary | ICD-10-CM | POA: Insufficient documentation

## 2021-05-27 MED ORDER — INCLISIRAN SODIUM 284 MG/1.5ML ~~LOC~~ SOSY
284.0000 mg | PREFILLED_SYRINGE | Freq: Once | SUBCUTANEOUS | Status: AC
  Administered 2021-05-27: 09:00:00 284 mg via SUBCUTANEOUS

## 2021-05-27 MED ORDER — INCLISIRAN SODIUM 284 MG/1.5ML ~~LOC~~ SOSY
PREFILLED_SYRINGE | SUBCUTANEOUS | Status: AC
  Filled 2021-05-27: qty 1.5

## 2021-06-02 ENCOUNTER — Other Ambulatory Visit: Payer: Self-pay

## 2021-06-02 DIAGNOSIS — E785 Hyperlipidemia, unspecified: Secondary | ICD-10-CM

## 2021-06-22 ENCOUNTER — Other Ambulatory Visit: Payer: Self-pay | Admitting: Cardiovascular Disease

## 2021-06-22 ENCOUNTER — Other Ambulatory Visit: Payer: Self-pay

## 2021-06-22 MED ORDER — AMLODIPINE BESYLATE 5 MG PO TABS: 5.0000 mg | ORAL_TABLET | Freq: Every day | ORAL | 0 refills | Status: DC

## 2021-06-22 MED ORDER — PRAVASTATIN SODIUM 20 MG PO TABS: 20.0000 mg | ORAL_TABLET | Freq: Every evening | ORAL | 0 refills | Status: DC

## 2021-06-22 NOTE — Addendum Note (Signed)
Addended by: Kandice Robinsons T on: 06/22/2021 03:41 PM   Modules accepted: Orders

## 2021-06-25 ENCOUNTER — Telehealth: Payer: Self-pay | Admitting: Cardiovascular Disease

## 2021-06-25 NOTE — Telephone Encounter (Signed)
[facility] calling d/t reported allergy of "statins"  advised pt has reported muscle aches atorvastatin and crestor but has not had any problems with taking pravastatin since 09/2020.  Pharmacist aware and will fill RX.

## 2021-06-25 NOTE — Telephone Encounter (Signed)
° °  Pt c/o medication issue:  1. Name of Medication: pravastatin (PRAVACHOL) 20 MG tablet  2. How are you currently taking this medication (dosage and times per day)? Take 1 tablet (20 mg total) by mouth every evening.  3. Are you having a reaction (difficulty breathing--STAT)?   4. What is your medication issue? Carissa with [facility] calling to verify prescription, they have a record that pt has allergy with one of the ingredient and wanted to check in if this is ok to fill this prescription. With ref# [id number]

## 2021-06-30 ENCOUNTER — Ambulatory Visit (INDEPENDENT_AMBULATORY_CARE_PROVIDER_SITE_OTHER): Payer: Medicare Other | Admitting: Physician Assistant

## 2021-06-30 ENCOUNTER — Other Ambulatory Visit: Payer: Self-pay

## 2021-06-30 ENCOUNTER — Encounter: Payer: Self-pay | Admitting: Physician Assistant

## 2021-06-30 VITALS — BP 122/60 | HR 83 | Ht 74.0 in | Wt 215.8 lb

## 2021-06-30 DIAGNOSIS — E785 Hyperlipidemia, unspecified: Secondary | ICD-10-CM

## 2021-06-30 DIAGNOSIS — I25700 Atherosclerosis of coronary artery bypass graft(s), unspecified, with unstable angina pectoris: Secondary | ICD-10-CM

## 2021-06-30 DIAGNOSIS — I10 Essential (primary) hypertension: Secondary | ICD-10-CM

## 2021-06-30 DIAGNOSIS — N1832 Chronic kidney disease, stage 3b: Secondary | ICD-10-CM | POA: Diagnosis not present

## 2021-06-30 DIAGNOSIS — I739 Peripheral vascular disease, unspecified: Secondary | ICD-10-CM

## 2021-06-30 NOTE — Patient Instructions (Signed)
Medication Instructions:   Your physician recommends that you continue on your current medications as directed. Please refer to the Current Medication list given to you today.  *If you need a refill on your cardiac medications before your next appointment, please call your pharmacy*   Lab Work:  None ordered.   If you have labs (blood work) drawn today and your tests are completely normal, you will receive your results only by: [place] (if you have MyChart) OR A paper copy in the mail If you have any lab test that is abnormal or we need to change your treatment, we will call you to review the results.   Testing/Procedures:  None ordered.   Follow-Up: At [facility], you and your health needs are our priority.  As part of our continuing mission to provide you with exceptional heart care, we have created designated Provider Care Teams.  These Care Teams include your primary Cardiologist (physician) and Advanced Practice Providers (APPs -  Physician Assistants and Nurse Practitioners) who all work together to provide you with the care you need, when you need it.  We recommend signing up for the patient portal called "MyChart".  Sign up information is provided on this After Visit Summary.  MyChart is used to connect with patients for Virtual Visits (Telemedicine).  Patients are able to view lab/test results, encounter notes, upcoming appointments, etc.  Non-urgent messages can be sent to your provider as well.   To learn more about what you can do with MyChart, go to [email or web address].    Your next appointment:   6 month(s)  The format for your next appointment:   In Person  Provider:   Mertie Moores, MD     Other Instructions  Your physician wants you to follow-up in: 6 months with Dr. Acie Fredrickson.  You will receive a reminder letter in the mail two months in advance. If you don't receive a letter, please call our office to schedule the follow-up appointment.  You  have been referred to the lipid clinic.

## 2021-06-30 NOTE — Progress Notes (Signed)
Office Visit    Patient Name: Derrick [name] Date of Encounter: 06/30/2021  PCP:  Kristen Loader, [facility]  Cardiologist:  Mertie Moores, MD  Advanced Practice Provider:  Liliane Shi, PA-C Electrophysiologist:  None    HPI    Derrick [name] is a 66 y.o. male with a hx of CAD status post PCI to LAD and LCx, status post CABG in 2018 with postoperative atrial fibrillation, PAD status post left Pham to post tib bypass 2016, status post right Pham to pop 2016, status post bilateral CIA stents in 2016, carotid artery stenosis, hyperlipidemia, hypertension, CKD, history of sinus bradycardia presents today for 60-month follow-up appointment.  [name] was seen in 5/22.  Richardson Dopp, [place] switched his Diltiazem to Amlodipine due to uncontrolled BP.  He returned for f/u on August 2022.  Since last seen, he had been doing fairly well.  Over the last several months, he has noted worsening shortness of breath with exertion.  He had symptoms like this prior to his bypass surgery.  He has not had chest discomfort.  He has not had syncope.  He does get lightheaded sometimes when he bends over and stands up.  He has not had orthopnea.  He has mild ankle edema bilaterally.  Left is always greater than the right.  He will need back surgery with Dr. Rolena Infante in October for a bulging disc.     Today, he does not endorse any chest pain or shortness of breath.  He has been doing rehab for his back surgery.  His last LDL was 77 when it was checked at [facility] (primary care office).  He has been doing his best to stay active and mostly walking indoors due to the weather.  His blood pressure is well controlled today 122/60 he is tolerating all his medications.He was started on Inclisiran and was made a big difference with his LDL.  We will send him back for follow-up to the lipid clinic.  Reports no shortness of breath nor dyspnea on exertion. Reports no chest pain, pressure, or  tightness. No edema, orthopnea, PND. Reports no palpitations.    Past Medical History    Past Medical History:  Diagnosis Date   Chronic kidney disease    "mild"-from taking meloxicam for many years.   Complication of anesthesia    "I was slow to wake up"   Coronary artery disease 2006   stents x2=LAD LCX; totally occluded RCA b. CABG 03/02/17 x2 (left internal mammary artery to LAD, Y-graft right mammary from left mammary to OM).// Myoview 9/22: Inferior infarct, no ischemia, EF 61; low risk // Echocardiogram 9/22: EF 55-60, no RWMA, GLS -24.5%, normal RVSF, RVSP 30.7, mild LAE, trivial MR, trivial AI, mild AV sclerosis w/o AS   Dyslipidemia    GERD (gastroesophageal reflux disease)    History of bronchitis    Hyperlipidemia    statin intolerant   Hyperparathyroidism    Hypertension    Insomnia    Metatarsalgia    Neuropathy    OA (osteoarthritis)    Peripheral vascular disease (HCC)    Seasonal allergies    Urinary frequency    Wears glasses    Past Surgical History:  Procedure Laterality Date   ABDOMINAL EXPOSURE N/A 03/31/2021   Procedure: ABDOMINAL EXPOSURE;  Surgeon: Marty Heck, MD;  Location: [place];  Service: Vascular;  Laterality: N/A;   ANTERIOR LUMBAR FUSION N/A 03/31/2021   Procedure:  ANTERIOR LUMBAR FUSION 1 LEVEL LUMBAR FIVE TO SACRAL ONE;  Surgeon: Melina Schools, MD;  Location: [place];  Service: Orthopedics;  Laterality: N/A;  3 C-BED  LEFT TAP BLOCK WITH EXPAREL   CARDIAC CATHETERIZATION  10/28/2004   circ and mid LAD chyper stents   CERVICAL FUSION  2007,2009   CERVICAL SPINE SURGERY  2004   COLONOSCOPY     CORONARY ANGIOPLASTY  2006   stents placed    CORONARY ARTERY BYPASS GRAFT N/A 03/02/2017   Procedure: CORONARY ARTERY BYPASS GRAFTING (CABG), ON PUMP, TIMES TWO, USING BILATERAL MAMMARY ARTERIES;  Surgeon: Melrose Nakayama, MD;  Location: [place];  Service: Open Heart Surgery;  Laterality: N/A;   ELBOW ARTHROPLASTY  1994   right    ENDARTERECTOMY FEMORAL Left 03/17/2015   Procedure: ENDARTERECTOMY COMMON FEMORAL WITH PROFUNDOPLASTY;  Surgeon: Elam Dutch, MD;  Location: [place];  Service: Vascular;  Laterality: Left;   FEMORAL-POPLITEAL BYPASS GRAFT Left 03/17/2015   Procedure: BYPASS GRAFT FEMORAL-POPLITEAL ARTERY-LEFT LEG AND POSTERIOR TIBIAL SEQUENTIAL GRAFT TO BELOW KNEE POPLITEAL ARTERY;  Surgeon: Elam Dutch, MD;  Location: [place];  Service: Vascular;  Laterality: Left;   FEMORAL-POPLITEAL BYPASS GRAFT Right 05/11/2015   Procedure:  RIGHT FEMORAL-BELOW THE KNEE POPLITEAL ARTERY BYPASS GRAFT USING NON REVERSE RIGHT GREATER SAPHENOUS VEIN;  Surgeon: Elam Dutch, MD;  Location: [place];  Service: Vascular;  Laterality: Right;   INCISION AND DRAINAGE ABSCESS POSTERIOR CERVICALSPINE  2009   INTRAOPERATIVE ARTERIOGRAM Left 03/17/2015   Procedure: INTRA OPERATIVE ARTERIOGRAM X2;  Surgeon: Elam Dutch, MD;  Location: [place];  Service: Vascular;  Laterality: Left;   INTRAOPERATIVE ARTERIOGRAM Right 05/11/2015   Procedure: INTRA OPERATIVE ARTERIOGRAM;  Surgeon: Elam Dutch, MD;  Location: [place];  Service: Vascular;  Laterality: Right;   LEFT HEART CATH AND CORONARY ANGIOGRAPHY N/A 03/01/2017   Procedure: LEFT HEART CATH AND CORONARY ANGIOGRAPHY;  Surgeon: Burnell Blanks, MD;  Location: [place] [facility];  Service: Cardiovascular;  Laterality: N/A;   PATCH ANGIOPLASTY Left 03/17/2015   Procedure: VEIN PATCH ANGIOPLASTY COMMON FEMORAL ARTERY;  Surgeon: Elam Dutch, MD;  Location: [place];  Service: Vascular;  Laterality: Left;   PERIPHERAL VASCULAR CATHETERIZATION N/A 01/16/2015   Procedure: Abdominal Aortogram;  Surgeon: Elam Dutch, MD;  Location: [place] [facility];  Service: Cardiovascular;  Laterality: N/A;   SHOULDER OPEN ROTATOR CUFF REPAIR Right 04/23/2013   Procedure: RIGHT TWO TENDON ROTATOR CUFF REPAIR  SUBACROMIAL DECOMPRESSION AND OPEN DISTAL CLAVICLE RESECTION;  Surgeon: Cammie Sickle., MD;  Location: [place];  Service: Orthopedics;  Laterality: Right;   stents placed   2006   TEE WITHOUT CARDIOVERSION N/A 03/02/2017   Procedure: TRANSESOPHAGEAL ECHOCARDIOGRAM (TEE);  Surgeon: Melrose Nakayama, MD;  Location: [place];  Service: Open Heart Surgery;  Laterality: N/A;   VEIN HARVEST Right 05/11/2015   Procedure: WITH NON REVERSE RIGHT GREATER SAPHENOUS VEIN HARVEST;  Surgeon: Elam Dutch, MD;  Location: [place];  Service: Vascular;  Laterality: Right;    Allergies  Allergies  Allergen Reactions   Codeine Itching and Other (See Comments)   Atorvastatin Other (See Comments)    Muscle aches Muscle aches   Crestor [Rosuvastatin Calcium]     Muscle aches   Meloxicam Other (See Comments)    Can not take due to kidneys   Statins Other (See Comments)    myalgia     EKGs/Labs/Other Studies Reviewed:   The  following studies were reviewed today: VAS US CAROTID DUPLEX BILATERAL 04/30/2020 Summary: Right Carotid: Velocities in the right ICA are consistent with a 1-39% stenosis. Left Carotid: Velocities in the left ICA are consistent with a 1-39% stenosis. Vertebrals:  Bilateral vertebral arteries demonstrate antegrade flow. Subclavians: Right subclavian artery was stenotic. Normal flow hemodynamics were seen in the left subclavian artery.   Cardiac catheterization 03/01/17 1. Severe triple vessel CAD 2. Severe ostial left main stenosis with dampening of pressures with catheter engagement.  3. Patent stent mid LAD 4. Patent stent mid Circumflex with moderate disease in the proximal Circumflex prior to the stent.  5. Chronic occlusion RCA with filling of distal branches by left to right collaterals.  6. Normal LV systolic function   Recommendations: He has high grade stenosis of the ostial left main artery and filling of the RCA from left to right collaterals. CABG is the best method for revascularization given his anatomy. Will consult CT  surgery.   EKG:  EKG is not ordered today.   Recent Labs: 01/28/2021: ALT 15 03/29/2021: BUN 23; Potassium 3.7; Sodium 138 04/01/2021: Creatinine, Ser 1.58; Hemoglobin 11.5; Platelets 208  Recent Lipid Panel    Component Value Date/Time   CHOL 199 01/28/2021 1006   TRIG 224 (H) 01/28/2021 1006   HDL 39 (L) 01/28/2021 1006   CHOLHDL 5.1 (H) 01/28/2021 1006   LDLCALC 121 (H) 01/28/2021 1006    Home Medications   Current Meds  Medication Sig   acetaminophen (TYLENOL) 650 MG CR tablet Take 1,300 mg by mouth in the morning and at bedtime.   albuterol (VENTOLIN HFA) 108 (90 Base) MCG/ACT inhaler Inhale 2 puffs into the lungs every 4 (four) hours as needed for wheezing or shortness of breath.   amLODipine (NORVASC) 5 MG tablet Take 1 tablet (5 mg total) by mouth daily.   aspirin 81 MG EC tablet 81 mg daily.   calcitRIOL (ROCALTROL) 0.5 MCG capsule Take 0.5 mcg by mouth daily.   enoxaparin (LOVENOX) 40 MG/0.4ML injection Inject 0.4 mLs (40 mg total) into the skin daily for 10 days. 10 day supply 1 injection per day   ezetimibe (ZETIA) 10 MG tablet Take 10 mg by mouth every evening.   famotidine (PEPCID) 20 MG tablet Take 20 mg by mouth 2 (two) times daily.   fenofibrate 160 MG tablet Take 160 mg by mouth every evening.   gabapentin (NEURONTIN) 300 MG capsule Take 300 mg by mouth at bedtime.   hydrochlorothiazide (HYDRODIURIL) 25 MG tablet Take 25 mg by mouth every evening.   ipratropium (ATROVENT) 0.03 % nasal spray Place 1 spray into both nostrils 2 (two) times daily as needed for rhinitis.   losartan (COZAAR) 100 MG tablet Take 100 mg by mouth every evening.    metoprolol tartrate (LOPRESSOR) 25 MG tablet Take 12.5 mg by mouth 2 (two) times daily.   nitroGLYCERIN (NITROSTAT) 0.4 MG SL tablet Place 1 tablet (0.4 mg total) under the tongue every 5 (five) minutes as needed for chest pain.   ondansetron (ZOFRAN) 4 MG tablet Take 1 tablet (4 mg total) by mouth every 8 (eight) hours as needed  for nausea or vomiting.   potassium chloride (KLOR-CON) 10 MEQ tablet TAKE 1 TABLET EVERY DAY   pravastatin (PRAVACHOL) 20 MG tablet Take 1 tablet (20 mg total) by mouth every evening.   Propylene Glycol (SYSTANE BALANCE) 0.6 % SOLN Place 1 drop into both eyes daily as needed (dry eyes).   tamsulosin (FLOMAX) 0.4 MG  CAPS capsule Take 0.4 mg by mouth every evening.   vitamin B-12 (CYANOCOBALAMIN) 500 MCG tablet Take 500 mcg by mouth daily.   Current Facility-Administered Medications for the 06/30/21 encounter (Office Visit) with Elgie Collard, PA-C  Medication   inclisiran (LEQVIO) injection 284 mg     Review of Systems      All other systems reviewed and are otherwise negative except as noted above.  Physical Exam    VS:  BP 122/60 (BP Location: Right Arm)    Pulse 83    Ht 6\' 2"  (1.88 m)    Wt 215 lb 12.8 oz (97.9 kg)    SpO2 96%    BMI 27.71 kg/m  , BMI Body mass index is 27.71 kg/m.  Wt Readings from Last 3 Encounters:  06/30/21 215 lb 12.8 oz (97.9 kg)  04/30/21 213 lb (96.6 kg)  03/31/21 216 lb (98 kg)     GEN: Well nourished, well developed, in no acute distress. HEENT: normal. Neck: Supple, no JVD, carotid bruits, or masses. Cardiac: RRR, no murmurs, rubs, or gallops. No clubbing, cyanosis, edema.  Radials/PT 2+ and equal bilaterally.  Respiratory:  Respirations regular and unlabored, clear to auscultation bilaterally. GI: Soft, nontender, nondistended. MS: No deformity or atrophy. Skin: Warm and dry, no rash. Neuro:  Strength and sensation are intact. Psych: Normal affect.  Assessment & Plan    CAD -Hx of prior stenting to the LAD and Lcx -CABG in 2018 -No chest pain or SOB -Continue rehabilitation, continue to increase physical activity  2. Stage 3B chronic kidney disease -Stable at this time -Continue to avoid nephrotoxic drugs -Continue to follow-up with your nephrologist  Essential HTN -Well-controlled today in the clinic, 122/60 mmHg -Continue to  monitor your blood pressure at home -Continue low-sodium diet  Mixed hyperlipidemia -LDL above goal of 70 -On Leqvio and recent LDL 77 -Will set him back up with the lipid clinic for optimization -Continue current medication   Disposition: Follow up 6 months with Mertie Moores, MD or APP.  Signed, Elgie Collard, PA-C 06/30/2021, 11:37 AM [place]

## 2021-07-07 ENCOUNTER — Telehealth: Payer: Self-pay

## 2021-07-07 DIAGNOSIS — E785 Hyperlipidemia, unspecified: Secondary | ICD-10-CM

## 2021-07-07 DIAGNOSIS — G720 Drug-induced myopathy: Secondary | ICD-10-CM

## 2021-07-07 NOTE — Telephone Encounter (Signed)
-----   Message from Awilda Metro, RPH-CPP sent at 07/07/2021 10:11 AM EST ----- Regarding: RE: labs Yes that is fine ----- Message ----- From: Eather Colas, CMA Sent: 07/07/2021   7:58 AM EST To: [facility] Pharm[facility] Subject: labs                                           Permission to order lipid labs post 2nd leqvio dose?

## 2021-07-07 NOTE — Telephone Encounter (Signed)
Called and lmom that we scheduled lipid labs same day as pharmd appt  and to call if they have questions

## 2021-07-23 ENCOUNTER — Other Ambulatory Visit: Payer: Self-pay

## 2021-07-23 ENCOUNTER — Ambulatory Visit: Payer: Medicare Other

## 2021-07-23 ENCOUNTER — Other Ambulatory Visit: Payer: Self-pay | Admitting: Pharmacist

## 2021-07-23 ENCOUNTER — Other Ambulatory Visit: Payer: Medicare Other | Admitting: *Deleted

## 2021-07-23 DIAGNOSIS — G720 Drug-induced myopathy: Secondary | ICD-10-CM

## 2021-07-23 DIAGNOSIS — E782 Mixed hyperlipidemia: Secondary | ICD-10-CM

## 2021-07-23 DIAGNOSIS — T466X5A Adverse effect of antihyperlipidemic and antiarteriosclerotic drugs, initial encounter: Secondary | ICD-10-CM

## 2021-07-23 DIAGNOSIS — E785 Hyperlipidemia, unspecified: Secondary | ICD-10-CM

## 2021-07-23 NOTE — Progress Notes (Unsigned)
Patient ID: Derrick [name]                 DOB: 1956/02/17                    MRN: [id number]     HPI: Derrick [name] is a 66 y.o. male patient of Dr. Harvie Bridge referred to lipid clinic by ***. PMH is significant for CAD status post PCI to LAD and LCx, status post CABG in 2018 with postoperative atrial fibrillation, PAD status post left Pham to post tib bypass 2016, status post right Pham to pop 2016, status post bilateral CIA stents in 2016, carotid artery stenosis, hyperlipidemia, hypertension, CKD, history of sinus bradycardia  Current Medications:  Intolerances:  Risk Factors:  LDL goal:   Diet:   Exercise:   Family History:   Social History:   Labs:  Past Medical History:  Diagnosis Date   Chronic kidney disease    "mild"-from taking meloxicam for many years.   Complication of anesthesia    "I was slow to wake up"   Coronary artery disease 2006   stents x2=LAD LCX; totally occluded RCA b. CABG 03/02/17 x2 (left internal mammary artery to LAD, Y-graft right mammary from left mammary to OM).// Myoview 9/22: Inferior infarct, no ischemia, EF 61; low risk // Echocardiogram 9/22: EF 55-60, no RWMA, GLS -24.5%, normal RVSF, RVSP 30.7, mild LAE, trivial MR, trivial AI, mild AV sclerosis w/o AS   Dyslipidemia    GERD (gastroesophageal reflux disease)    History of bronchitis    Hyperlipidemia    statin intolerant   Hyperparathyroidism    Hypertension    Insomnia    Metatarsalgia    Neuropathy    OA (osteoarthritis)    Peripheral vascular disease (HCC)    Seasonal allergies    Urinary frequency    Wears glasses     Current Outpatient Medications on File Prior to Visit  Medication Sig Dispense Refill   acetaminophen (TYLENOL) 650 MG CR tablet Take 1,300 mg by mouth in the morning and at bedtime.     albuterol (VENTOLIN HFA) 108 (90 Base) MCG/ACT inhaler Inhale 2 puffs into the lungs every 4 (four) hours as needed for wheezing or shortness of breath.     amLODipine  (NORVASC) 5 MG tablet Take 1 tablet (5 mg total) by mouth daily. 90 tablet 0   aspirin 81 MG EC tablet 81 mg daily.     calcitRIOL (ROCALTROL) 0.5 MCG capsule Take 0.5 mcg by mouth daily.     enoxaparin (LOVENOX) 40 MG/0.4ML injection Inject 0.4 mLs (40 mg total) into the skin daily for 10 days. 10 day supply 1 injection per day 4 mL 0   ezetimibe (ZETIA) 10 MG tablet Take 10 mg by mouth every evening.     famotidine (PEPCID) 20 MG tablet Take 20 mg by mouth 2 (two) times daily.     fenofibrate 160 MG tablet Take 160 mg by mouth every evening.     gabapentin (NEURONTIN) 300 MG capsule Take 300 mg by mouth at bedtime.     hydrochlorothiazide (HYDRODIURIL) 25 MG tablet Take 25 mg by mouth every evening.     ipratropium (ATROVENT) 0.03 % nasal spray Place 1 spray into both nostrils 2 (two) times daily as needed for rhinitis.     losartan (COZAAR) 100 MG tablet Take 100 mg by mouth every evening.      metoprolol tartrate (LOPRESSOR) 25 MG tablet Take 12.5 mg  by mouth 2 (two) times daily.     nitroGLYCERIN (NITROSTAT) 0.4 MG SL tablet Place 1 tablet (0.4 mg total) under the tongue every 5 (five) minutes as needed for chest pain. 75 tablet 1   ondansetron (ZOFRAN) 4 MG tablet Take 1 tablet (4 mg total) by mouth every 8 (eight) hours as needed for nausea or vomiting. 20 tablet 0   potassium chloride (KLOR-CON) 10 MEQ tablet TAKE 1 TABLET EVERY DAY 90 tablet 2   pravastatin (PRAVACHOL) 20 MG tablet Take 1 tablet (20 mg total) by mouth every evening. 90 tablet 0   Propylene Glycol (SYSTANE BALANCE) 0.6 % SOLN Place 1 drop into both eyes daily as needed (dry eyes).     tamsulosin (FLOMAX) 0.4 MG CAPS capsule Take 0.4 mg by mouth every evening.     vitamin B-12 (CYANOCOBALAMIN) 500 MCG tablet Take 500 mcg by mouth daily.     Current Facility-Administered Medications on File Prior to Visit  Medication Dose Route Frequency Provider Last Rate Last Admin   inclisiran (LEQVIO) injection 284 mg  284 mg  Subcutaneous Once Nahser, Deloris Ping, MD        Allergies  Allergen Reactions   Codeine Itching and Other (See Comments)   Atorvastatin Other (See Comments)    Muscle aches Muscle aches   Crestor [Rosuvastatin Calcium]     Muscle aches   Meloxicam Other (See Comments)    Can not take due to kidneys   Statins Other (See Comments)    myalgia    Assessment/Plan:  1. Hyperlipidemia -    Thank you,   Olene Floss, Pharm.D, BCPS, CPP [facility]  [address] [place], [place] [address]  Phone: [phone]; Fax: [phone]

## 2021-07-24 LAB — LIPID PANEL
Chol/HDL Ratio: 2.7 ratio (ref 0.0–5.0)
Cholesterol, Total: 162 mg/dL (ref 100–199)
HDL: 61 mg/dL (ref >39)
LDL Chol Calc (NIH): 78 mg/dL (ref 0–99)
Triglycerides: 133 mg/dL (ref 0–149)
VLDL Cholesterol Cal: 23 mg/dL (ref 5–40)

## 2021-07-24 LAB — APOLIPOPROTEIN B: Apolipoprotein B: 79 mg/dL (ref <90)

## 2021-07-26 ENCOUNTER — Encounter: Payer: Self-pay | Admitting: Cardiovascular Disease

## 2021-07-26 NOTE — Progress Notes (Signed)
Mailed to patient per patient request

## 2021-09-09 ENCOUNTER — Telehealth: Payer: Self-pay | Admitting: *Deleted

## 2021-09-09 NOTE — Telephone Encounter (Signed)
Patient called c/o shortness of breath with yard work along with bilateral lower extremity pain and weakness. After discussing with Aldona Bar, PA, I instructed patient to call his PCP and alert them of shortness of breath.  I am having his ultrasounds scheduled.  Patient voiced understanding of  the instructions. ?

## 2021-09-14 ENCOUNTER — Other Ambulatory Visit: Payer: Self-pay | Admitting: Family Medicine

## 2021-09-14 DIAGNOSIS — R0602 Shortness of breath: Secondary | ICD-10-CM

## 2021-09-15 ENCOUNTER — Ambulatory Visit
Admission: RE | Admit: 2021-09-15 | Discharge: 2021-09-15 | Disposition: A | Discharge status: Home / Self Care | Payer: Medicare Other | Source: Ambulatory Visit | Attending: Family Medicine | Admitting: Family Medicine

## 2021-09-15 DIAGNOSIS — R0602 Shortness of breath: Secondary | ICD-10-CM

## 2021-09-15 MED ORDER — IOPAMIDOL (ISOVUE-370) INJECTION 76%
75.0000 mL | Freq: Once | INTRAVENOUS | Status: AC | PRN
  Administered 2021-09-15: 75 mL via INTRAVENOUS

## 2021-09-26 ENCOUNTER — Other Ambulatory Visit: Payer: Self-pay | Admitting: Cardiovascular Disease

## 2021-10-04 ENCOUNTER — Other Ambulatory Visit: Payer: Self-pay

## 2021-10-04 DIAGNOSIS — M7989 Other specified soft tissue disorders: Secondary | ICD-10-CM

## 2021-10-04 DIAGNOSIS — I779 Disorder of arteries and arterioles, unspecified: Secondary | ICD-10-CM

## 2021-10-04 DIAGNOSIS — I6523 Occlusion and stenosis of bilateral carotid arteries: Secondary | ICD-10-CM

## 2021-10-14 ENCOUNTER — Ambulatory Visit (INDEPENDENT_AMBULATORY_CARE_PROVIDER_SITE_OTHER)
Admission: RE | Admit: 2021-10-14 | Discharge: 2021-10-14 | Disposition: A | Discharge status: Home / Self Care | Payer: Medicare Other | Source: Ambulatory Visit | Attending: Vascular Surgery | Admitting: Vascular Surgery

## 2021-10-14 ENCOUNTER — Ambulatory Visit (HOSPITAL_COMMUNITY)
Admission: RE | Admit: 2021-10-14 | Discharge: 2021-10-14 | Disposition: A | Discharge status: Home / Self Care | Payer: Medicare Other | Source: Ambulatory Visit | Attending: Vascular Surgery | Admitting: Vascular Surgery

## 2021-10-14 ENCOUNTER — Encounter (HOSPITAL_COMMUNITY): Payer: Medicare Other

## 2021-10-14 ENCOUNTER — Ambulatory Visit (INDEPENDENT_AMBULATORY_CARE_PROVIDER_SITE_OTHER): Payer: Medicare Other | Admitting: Physician Assistant

## 2021-10-14 VITALS — BP 137/68 | HR 57 | Temp 98.2°F | Resp 20 | Ht 74.0 in | Wt 219.2 lb

## 2021-10-14 DIAGNOSIS — I6523 Occlusion and stenosis of bilateral carotid arteries: Secondary | ICD-10-CM

## 2021-10-14 DIAGNOSIS — I779 Disorder of arteries and arterioles, unspecified: Secondary | ICD-10-CM

## 2021-10-14 NOTE — Progress Notes (Signed)
Office Note     CC:  follow up Requesting Provider:  Kristen Loader, [name]  HPI: Derrick [name] is a 66 y.o. (Jun 29, 1955) male who presents to office in follow-up to review numerous imaging studies related to carotid artery stenosis and PAD.  He denies any neurological symptoms since last office visit.  He also denies any current strokelike symptoms including slurring speech, changes in vision, or one-sided weakness.  Surgical history significant for bilateral iliac stenting as well as Right common femoral to below the knee popliteal bypass as well as left common femoral to PT bypass by Dr. Oneida Alar in 2016.  He denies any claudication, rest pain, or nonhealing wounds of bilateral lower extremities.  He is on aspirin and statin daily.  He quit smoking in 1992.   Past Medical History:  Diagnosis Date   Chronic kidney disease    "mild"-from taking meloxicam for many years.   Complication of anesthesia    "I was slow to wake up"   Coronary artery disease 2006   stents x2=LAD LCX; totally occluded RCA b. CABG 03/02/17 x2 (left internal mammary artery to LAD, Y-graft right mammary from left mammary to OM).// Myoview 9/22: Inferior infarct, no ischemia, EF 61; low risk // Echocardiogram 9/22: EF 55-60, no RWMA, GLS -24.5%, normal RVSF, RVSP 30.7, mild LAE, trivial MR, trivial AI, mild AV sclerosis w/o AS   Dyslipidemia    GERD (gastroesophageal reflux disease)    History of bronchitis    Hyperlipidemia    statin intolerant   Hyperparathyroidism    Hypertension    Insomnia    Metatarsalgia    Neuropathy    OA (osteoarthritis)    Peripheral vascular disease (HCC)    Seasonal allergies    Urinary frequency    Wears glasses     Past Surgical History:  Procedure Laterality Date   ABDOMINAL EXPOSURE N/A 03/31/2021   Procedure: ABDOMINAL EXPOSURE;  Surgeon: Marty Heck, MD;  Location: [facility] OR;  Service: Vascular;  Laterality: N/A;   ANTERIOR LUMBAR FUSION N/A 03/31/2021   Procedure:  ANTERIOR LUMBAR FUSION 1 LEVEL LUMBAR FIVE TO SACRAL ONE;  Surgeon: Melina Schools, MD;  Location: [place];  Service: Orthopedics;  Laterality: N/A;  3 C-BED  LEFT TAP BLOCK WITH EXPAREL   CARDIAC CATHETERIZATION  10/28/2004   circ and mid LAD chyper stents   CERVICAL FUSION  2007,2009   CERVICAL SPINE SURGERY  2004   COLONOSCOPY     CORONARY ANGIOPLASTY  2006   stents placed    CORONARY ARTERY BYPASS GRAFT N/A 03/02/2017   Procedure: CORONARY ARTERY BYPASS GRAFTING (CABG), ON PUMP, TIMES TWO, USING BILATERAL MAMMARY ARTERIES;  Surgeon: Melrose Nakayama, MD;  Location[facility];  Service: Open Heart Surgery;  Laterality: N/A;   ELBOW ARTHROPLASTY  1994   right   ENDARTERECTOMY FEMORAL Left 03/17/2015   Procedure: ENDARTERECTOMY COMMON FEMORAL WITH PROFUNDOPLASTY;  Surgeon: Elam Dutch, MD;  Location: [facility] OR;  Service: Vascular;  Laterality: Left;   FEMORAL-POPLITEAL BYPASS GRAFT Left 03/17/2015   Procedure: BYPASS GRAFT FEMORAL-POPLITEAL ARTERY-LEFT LEG AND POSTERIOR TIBIAL SEQUENTIAL GRAFT TO BELOW KNEE POPLITEAL ARTERY;  Surgeon: Elam Dutch, MD;  Location: [place];  Service: Vascular;  Laterality: Left;   FEMORAL-POPLITEAL BYPASS GRAFT Right 05/11/2015   Procedure:  RIGHT FEMORAL-BELOW THE KNEE POPLITEAL ARTERY BYPASS GRAFT USING NON REVERSE RIGHT GREATER SAPHENOUS VEIN;  Surgeon: Elam Dutch, MD;  Location: [place];  Service: Vascular;  Laterality: Right;  INCISION AND DRAINAGE ABSCESS POSTERIOR CERVICALSPINE  2009   INTRAOPERATIVE ARTERIOGRAM Left 03/17/2015   Procedure: INTRA OPERATIVE ARTERIOGRAM X2;  Surgeon: Sherren Kerns, MD;  Location: [facility] OR;  Service: Vascular;  Laterality: Left;   INTRAOPERATIVE ARTERIOGRAM Right 05/11/2015   Procedure: INTRA OPERATIVE ARTERIOGRAM;  Surgeon: Sherren Kerns, MD;  Location: [facility] OR;  Service: Vascular;  Laterality: Right;   LEFT HEART CATH AND CORONARY ANGIOGRAPHY N/A 03/01/2017   Procedure: LEFT HEART CATH AND CORONARY ANGIOGRAPHY;   Surgeon: Kathleene Hazel, MD;  Location: [facility];  Service: Cardiovascular;  Laterality: N/A;   PATCH ANGIOPLASTY Left 03/17/2015   Procedure: VEIN PATCH ANGIOPLASTY COMMON FEMORAL ARTERY;  Surgeon: Sherren Kerns, MD;  Location: [facility] OR;  Service: Vascular;  Laterality: Left;   PERIPHERAL VASCULAR CATHETERIZATION N/A 01/16/2015   Procedure: Abdominal Aortogram;  Surgeon: Sherren Kerns, MD;  Location: [facility] INVASIVE CV LAB;  Service: Cardiovascular;  Laterality: N/A;   SHOULDER OPEN ROTATOR CUFF REPAIR Right 04/23/2013   Procedure: RIGHT TWO TENDON ROTATOR CUFF REPAIR  SUBACROMIAL DECOMPRESSION AND OPEN DISTAL CLAVICLE RESECTION;  Surgeon: Wyn Forster., MD;  Location: [facility];  Service: Orthopedics;  Laterality: Right;   stents placed   2006   TEE WITHOUT CARDIOVERSION N/A 03/02/2017   Procedure: TRANSESOPHAGEAL ECHOCARDIOGRAM (TEE);  Surgeon: Loreli Slot, MD;  Location: [facility] OR;  Service: Open Heart Surgery;  Laterality: N/A;   VEIN HARVEST Right 05/11/2015   Procedure: WITH NON REVERSE RIGHT GREATER SAPHENOUS VEIN HARVEST;  Surgeon: Sherren Kerns, MD;  Location: [facility] OR;  Service: Vascular;  Laterality: Right;    Social History   Socioeconomic History   Marital status: Married    Spouse name: Not on file   Number of children: 1   Years of education: 12   Highest education level: High school graduate  Occupational History   Occupation: Retired  Tobacco Use   Smoking status: Former    Types: Cigarettes    Quit date: 05/30/1989    Years since quitting: 32.3    Passive exposure: Never   Smokeless tobacco: Never  Vaping Use   Vaping Use: Never used  Substance and Sexual Activity   Alcohol use: Yes    Comment: 1 beer every 6 months   Drug use: Never   Sexual activity: Not on file  Other Topics Concern   Not on file  Social History Narrative   Lives with his wife.   One stepson.   Left-handed.   One cup caffeine per week.     Social Determinants of Health   Financial Resource Strain: Not on file  Food Insecurity: Not on file  Transportation Needs: Not on file  Physical Activity: Not on file  Stress: Not on file  Social Connections: Not on file  Intimate Partner Violence: Not on file    Family History  Problem Relation Age of Onset   Arthritis Mother    Varicose Veins Mother    Lung cancer Father    COPD Father        Lung   Cancer Brother        Lukemia   COPD Brother    Cancer Brother        Prostate    Current Outpatient Medications  Medication Sig Dispense Refill   acetaminophen (TYLENOL) 650 MG CR tablet Take 1,300 mg by mouth in the morning and at bedtime.     albuterol (VENTOLIN HFA) 108 (90 Base)  MCG/ACT inhaler Inhale 2 puffs into the lungs every 4 (four) hours as needed for wheezing or shortness of breath.     amLODipine (NORVASC) 5 MG tablet TAKE 1 TABLET DAILY 90 tablet 0   aspirin 81 MG EC tablet 81 mg daily.     calcitRIOL (ROCALTROL) 0.5 MCG capsule Take 0.5 mcg by mouth daily.     ezetimibe (ZETIA) 10 MG tablet Take 10 mg by mouth every evening.     famotidine (PEPCID) 20 MG tablet Take 20 mg by mouth 2 (two) times daily.     fenofibrate 160 MG tablet Take 160 mg by mouth every evening.     gabapentin (NEURONTIN) 300 MG capsule Take 300 mg by mouth at bedtime.     hydrochlorothiazide (HYDRODIURIL) 25 MG tablet Take 25 mg by mouth every evening.     ipratropium (ATROVENT) 0.03 % nasal spray Place 1 spray into both nostrils 2 (two) times daily as needed for rhinitis.     losartan (COZAAR) 100 MG tablet Take 100 mg by mouth every evening.      metoprolol tartrate (LOPRESSOR) 25 MG tablet Take 12.5 mg by mouth 2 (two) times daily.     nitroGLYCERIN (NITROSTAT) 0.4 MG SL tablet Place 1 tablet (0.4 mg total) under the tongue every 5 (five) minutes as needed for chest pain. 75 tablet 1   ondansetron (ZOFRAN) 4 MG tablet Take 1 tablet (4 mg total) by mouth every 8 (eight) hours as  needed for nausea or vomiting. 20 tablet 0   potassium chloride (KLOR-CON) 10 MEQ tablet TAKE 1 TABLET EVERY DAY 90 tablet 2   pravastatin (PRAVACHOL) 20 MG tablet TAKE 1 TABLET EVERY EVENING 90 tablet 0   Propylene Glycol (SYSTANE BALANCE) 0.6 % SOLN Place 1 drop into both eyes daily as needed (dry eyes).     tamsulosin (FLOMAX) 0.4 MG CAPS capsule Take 0.4 mg by mouth every evening.     vitamin B-12 (CYANOCOBALAMIN) 500 MCG tablet Take 500 mcg by mouth daily.     enoxaparin (LOVENOX) 40 MG/0.4ML injection Inject 0.4 mLs (40 mg total) into the skin daily for 10 days. 10 day supply 1 injection per day 4 mL 0   Current Facility-Administered Medications  Medication Dose Route Frequency Provider Last Rate Last Admin   inclisiran (LEQVIO) injection 284 mg  284 mg Subcutaneous Once Nahser, Wonda Cheng, MD        Allergies  Allergen Reactions   Codeine Itching and Other (See Comments)   Atorvastatin Other (See Comments)    Muscle aches Muscle aches   Crestor [Rosuvastatin Calcium]     Muscle aches   Meloxicam Other (See Comments)    Can not take due to kidneys   Statins Other (See Comments)    myalgia     REVIEW OF SYSTEMS:   [X]  denotes positive finding, [ ]  denotes negative finding Cardiac  Comments:  Chest pain or chest pressure:    Shortness of breath upon exertion:    Short of breath when lying flat:    Irregular heart rhythm:        Vascular    Pain in calf, thigh, or hip brought on by ambulation:    Pain in feet at night that wakes you up from your sleep:     Blood clot in your veins:    Leg swelling:         Pulmonary    Oxygen at home:    Productive cough:  Wheezing:         Neurologic    Sudden weakness in arms or legs:     Sudden numbness in arms or legs:     Sudden onset of difficulty speaking or slurred speech:    Temporary loss of vision in one eye:     Problems with dizziness:         Gastrointestinal    Blood in stool:     Vomited blood:          Genitourinary    Burning when urinating:     Blood in urine:        Psychiatric    Major depression:         Hematologic    Bleeding problems:    Problems with blood clotting too easily:        Skin    Rashes or ulcers:        Constitutional    Fever or chills:      PHYSICAL EXAMINATION:  Vitals:   10/14/21 0915 10/14/21 0917  BP: (!) 147/67 137/68  Pulse: (!) 57   Resp: 20   Temp: 98.2 F (36.8 C)   TempSrc: Temporal   SpO2: 96%   Weight: 219 lb 3.2 oz (99.4 kg)   Height: 6\' 2"  (1.88 m)     General:  WDWN in NAD; vital signs documented above Gait: Not observed HENT: WNL, normocephalic Pulmonary: normal non-labored breathing , without Rales, rhonchi,  wheezing Cardiac: regular HR Abdomen: soft, NT, no masses Skin: without rashes Vascular Exam/Pulses:  Right Left  Radial 2+ (normal) 2+ (normal)  DP 1+ (weak) 2+ (normal)  PT absent 2+ (normal)   Extremities: without ischemic changes, without Gangrene , without cellulitis; without open wounds;  Musculoskeletal: no muscle wasting or atrophy  Neurologic: A&O X 3;  No focal weakness or paresthesias are detected; CN grossly intact Psychiatric:  The pt has Normal affect.   Non-Invasive Vascular Imaging:   1 to 39% stenosis of bilateral internal carotid arteries  Velocity of 250 cm/s in the right common iliac stent Velocity of 260 cm/s in the left common iliac stent  Right lower extremity bypass widely patent Left lower extremity bypass widely patent; velocity of 182 cm/s in the proximal anastomosis  ABI/TBIToday's ABIToday's TBIPrevious ABIPrevious TBI  +-------+-----------+-----------+------------+------------+  Right  1.14       0.74       1.27        0.61          +-------+-----------+-----------+------------+------------+  Left   1.29       0.71       1.00        0.72           ASSESSMENT/PLAN:: 66 y.o. male here for follow up for surveillance of carotid artery stenosis as well as  PAD  -Carotid duplex is stable with bilateral internal carotid arteries measuring 1 to 39% stenosis.  Repeat carotid duplex in 2 years -Feet are well-perfused with palpable pedal pulses -He does have mild elevated velocities in bilateral common iliac stents however it is not of hemodynamic significance.  We will recheck aortoiliac duplex in 1 year -Bilateral lower extremity bypass grafts are also widely patent.  ABIs are unchanged over the past year.  We will repeat bypass surveillance as well as ABIs in another year.  Patient knows to call/return office sooner with any questions or concerns   Dagoberto Ligas, PA-C [facility] [phone]  Clinic MD:  Scot Dock

## 2021-11-09 ENCOUNTER — Encounter: Payer: Self-pay | Admitting: Cardiovascular Disease

## 2021-11-09 ENCOUNTER — Ambulatory Visit (INDEPENDENT_AMBULATORY_CARE_PROVIDER_SITE_OTHER): Payer: Medicare Other | Admitting: Cardiovascular Disease

## 2021-11-09 VITALS — BP 142/60 | HR 68 | Ht 74.0 in | Wt 220.8 lb

## 2021-11-09 DIAGNOSIS — I10 Essential (primary) hypertension: Secondary | ICD-10-CM | POA: Diagnosis not present

## 2021-11-09 DIAGNOSIS — I25700 Atherosclerosis of coronary artery bypass graft(s), unspecified, with unstable angina pectoris: Secondary | ICD-10-CM | POA: Diagnosis not present

## 2021-11-09 DIAGNOSIS — I779 Disorder of arteries and arterioles, unspecified: Secondary | ICD-10-CM

## 2021-11-09 MED ORDER — CARVEDILOL 12.5 MG PO TABS: 12.5000 mg | ORAL_TABLET | Freq: Two times a day (BID) | ORAL | 1 refills | Status: DC

## 2021-11-09 MED ORDER — CARVEDILOL 12.5 MG PO TABS: 12.5000 mg | ORAL_TABLET | Freq: Two times a day (BID) | ORAL | 3 refills | Status: DC

## 2021-11-09 NOTE — Progress Notes (Signed)
Derrick [name] Date of Birth  23-Jan-1956 [name] / Derrick [name] [address] [place], [place]  [address] [phone]  Fax  [phone]  Problem List: 1. CAD, status post stents to the LAD,  left circumflex artery 2006 . His chronically occluded right coronary artery S/p CABG ( bilateral IMA )  2. hyperlipidemia-intolerant to statins 3. Hypertension 4. Peripheral Vascular disease    66 yo male with history of CAD - s/p stents to LAD and LCx.  Chronically occluded RCA.  No angina.  He eats a very fatty diet - lots of fried foods.  Not exercising.  He does lots of yard work and has never had any episodes of angina.  Oct 01, 2013:  Derrick [name] was seen by [name] in Oct. 2014 for pre-op eval prior to rotator cuff surgery.    His myoview study showed:  Low risk stress nuclear study with a large, severe intensity, partially reversible inferior defect consistent with prior inferior infarct and very mild peri-infarct ischemia.  LV Ejection Fraction: 54%. LV Wall Motion: Inferior hypokinesis.  He has shoulder surgery without difficulty.  He's doing well. He denies any chest pain or shortness of breath.  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     Feb. 12 2018:    Has done well.  Has hx of PVD - Derrick [name])   Oct. 1 2018;    Derrick [name] has been having some DOE , chest pain with exertion Multiple occasions over the past month,,   Gradually getting worse   Occurred while he was pruning and cleaning up his yard.  Is exercising more.  Works 11 hours a day . 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  Symptoms feel very similar to his previous symptoms in 2006  May 05, 2017:  Derrick [name] is doing well.  He had a cath in October that showed significant left [name] disease as well as other narrowings.  He had coronary artery bypass grafting.  He is done well since that time.  He had some postoperative atrial fibrillation.  He is on Cardizem 120 mg a day  as well as metoprolol 25 mg twice a day.  He was not started on anticoagulation.  August 31, 2017: Derrick [name] is seen today for follow-up visit.  He has a history of coronary artery bypass grafting with postoperative atrial fibrillation.  History of hypertension and hyperlipidemia.  Has gained some weight.  Walks some  No CP ,   Lipids are elevatee.  intolerent to statins   Sept. 26, 2019: Doing well since his CABG in Oct. 2018 Was noted to have very minimal carotid artery disease at that time. Walks some .   9000-10000 steps a day  Still works  Hydrologist )   Is in the Clear trial .     July 22, 2019  Hx of CABG in 2018.  No further episode so aF recently  Still at [employer] to retire.soon .    Got his shingles shot Advised him to delay his covid shot for several weeks.   Feb, 18, 2022:  Derrick [name] is seen today for follow up of his CAD /CABG. BP is mildly elevated.  Goes out to eat every morning at [employer] from primary MD are reviewed.  Trigs are > 500, LDL is 48  November 09, 2021 Derrick [name] is seen for follow up of his CAD / CABG  Multiple complaints Not sleeping well  Amlodipine has been increased Hydralazine was added, then stopped  HCTZ was restarted   Likely eats more salt than he should  Rides stationary bike, does yard work , [employer] once a week .   No CP , no dyspnea   Does not eat fresh veggies ( wife uses canned )   Lots of stiffness    Current Outpatient Medications  Medication Sig Dispense Refill   acetaminophen (TYLENOL) 650 MG CR tablet Take 1,300 mg by mouth in the morning and at bedtime.     albuterol (VENTOLIN HFA) 108 (90 Base) MCG/ACT inhaler Inhale 2 puffs into the lungs every 4 (four) hours as needed for wheezing or shortness of breath.     amLODipine (NORVASC) 10 MG tablet Take 10 mg by mouth daily.     aspirin 81 MG EC tablet 81 mg daily.     calcitRIOL (ROCALTROL) 0.5 MCG capsule Take 0.5 mcg by mouth daily.     enoxaparin  (LOVENOX) 40 MG/0.4ML injection Inject 0.4 mLs (40 mg total) into the skin daily for 10 days. 10 day supply 1 injection per day 4 mL 0   ezetimibe (ZETIA) 10 MG tablet Take 10 mg by mouth every evening.     famotidine (PEPCID) 20 MG tablet Take 20 mg by mouth 2 (two) times daily.     fenofibrate 160 MG tablet Take 160 mg by mouth every evening.     gabapentin (NEURONTIN) 300 MG capsule Take 300 mg by mouth at bedtime.     hydrochlorothiazide (HYDRODIURIL) 25 MG tablet Take 25 mg by mouth every evening.     ipratropium (ATROVENT) 0.03 % nasal spray Place 1 spray into both nostrils 2 (two) times daily as needed for rhinitis.     losartan (COZAAR) 100 MG tablet Take 100 mg by mouth every evening.      metoprolol tartrate (LOPRESSOR) 25 MG tablet Take 12.5 mg by mouth 2 (two) times daily.     nitroGLYCERIN (NITROSTAT) 0.4 MG SL tablet Place 1 tablet (0.4 mg total) under the tongue every 5 (five) minutes as needed for chest pain. 75 tablet 1   ondansetron (ZOFRAN) 4 MG tablet Take 1 tablet (4 mg total) by mouth every 8 (eight) hours as needed for nausea or vomiting. 20 tablet 0   potassium chloride (KLOR-CON) 10 MEQ tablet TAKE 1 TABLET EVERY DAY 90 tablet 2   pravastatin (PRAVACHOL) 20 MG tablet TAKE 1 TABLET EVERY EVENING 90 tablet 0   Propylene Glycol (SYSTANE BALANCE) 0.6 % SOLN Place 1 drop into both eyes daily as needed (dry eyes).     tamsulosin (FLOMAX) 0.4 MG CAPS capsule Take 0.4 mg by mouth every evening.     traZODone (DESYREL) 50 MG tablet Take 50 mg by mouth at bedtime.     vitamin B-12 (CYANOCOBALAMIN) 500 MCG tablet Take 500 mcg by mouth daily.     Current Facility-Administered Medications  Medication Dose Route Frequency Provider Last Rate Last Admin   inclisiran (LEQVIO) injection 284 mg  284 mg Subcutaneous Once Lamontae Ricardo, Deloris PingPhilip J, MD         Allergies  Allergen Reactions   Codeine Itching and Other (See Comments)   Atorvastatin Other (See Comments)    Muscle aches Muscle  aches   Crestor [Rosuvastatin Calcium]     Muscle aches   Meloxicam Other (See Comments)    Can not take due to kidneys   Statins Other (See Comments)  myalgia    Past Medical History:  Diagnosis Date   Chronic kidney disease    "mild"-from taking meloxicam for many years.   Complication of anesthesia    "I was slow to wake up"   Coronary artery disease 2006   stents x2=LAD LCX; totally occluded RCA b. CABG 03/02/17 x2 (left internal mammary artery to LAD, Y-graft right mammary from left mammary to OM).// Myoview 9/22: Inferior infarct, no ischemia, EF 61; low risk // Echocardiogram 9/22: EF 55-60, no RWMA, GLS -24.5%, normal RVSF, RVSP 30.7, mild LAE, trivial MR, trivial AI, mild AV sclerosis w/o AS   Dyslipidemia    GERD (gastroesophageal reflux disease)    History of bronchitis    Hyperlipidemia    statin intolerant   Hyperparathyroidism    Hypertension    Insomnia    Metatarsalgia    Neuropathy    OA (osteoarthritis)    Peripheral vascular disease (HCC)    Seasonal allergies    Urinary frequency    Wears glasses     Past Surgical History:  Procedure Laterality Date   ABDOMINAL EXPOSURE N/A 03/31/2021   Procedure: ABDOMINAL EXPOSURE;  Surgeon: Cephus Shelling, MD;  Location: [facility] OR;  Service: Vascular;  Laterality: N/A;   ANTERIOR LUMBAR FUSION N/A 03/31/2021   Procedure: ANTERIOR LUMBAR FUSION 1 LEVEL LUMBAR FIVE TO SACRAL ONE;  Surgeon: Venita Lick, MD;  Location: [facility] OR;  Service: Orthopedics;  Laterality: N/A;  3 C-BED  LEFT TAP BLOCK WITH EXPAREL   CARDIAC CATHETERIZATION  10/28/2004   circ and mid LAD chyper stents   CERVICAL FUSION  2007,2009   CERVICAL SPINE SURGERY  2004   COLONOSCOPY     CORONARY ANGIOPLASTY  2006   stents placed    CORONARY ARTERY BYPASS GRAFT N/A 03/02/2017   Procedure: CORONARY ARTERY BYPASS GRAFTING (CABG), ON PUMP, TIMES TWO, USING BILATERAL MAMMARY ARTERIES;  Surgeon: Loreli Slot, MD;  Location: [facility] OR;  Service: Open  Heart Surgery;  Laterality: N/A;   ELBOW ARTHROPLASTY  1994   right   ENDARTERECTOMY FEMORAL Left 03/17/2015   Procedure: ENDARTERECTOMY COMMON FEMORAL WITH PROFUNDOPLASTY;  Surgeon: Sherren Kerns, MD;  [name];  Service: Vascular;  Laterality: Left;   FEMORAL-POPLITEAL BYPASS GRAFT Left 03/17/2015   Procedure: BYPASS GRAFT FEMORAL-POPLITEAL ARTERY-LEFT LEG AND POSTERIOR TIBIAL SEQUENTIAL GRAFT TO BELOW KNEE POPLITEAL ARTERY;  Surgeon: Sherren Kerns, MD;  [name];  Service: Vascular;  Laterality: Left;   FEMORAL-POPLITEAL BYPASS GRAFT Right 05/11/2015   Procedure:  RIGHT FEMORAL-BELOW THE KNEE POPLITEAL ARTERY BYPASS GRAFT USING NON REVERSE RIGHT GREATER SAPHENOUS VEIN;  Surgeon: Sherren Kerns, MD;  Location: [name] OR;  Service: Vascular;  Laterality: Right;   INCISION AND DRAINAGE ABSCESS POSTERIOR CERVICALSPINE  2009   INTRAOPERATIVE ARTERIOGRAM Left 03/17/2015   Procedure: INTRA OPERATIVE ARTERIOGRAM X2;  Surgeon: Sherren Kerns, MD;  Location[name];  Service: Vascular;  Laterality: Left;   INTRAOPERATIVE ARTERIOGRAM Right 05/11/2015   Procedure: INTRA OPERATIVE ARTERIOGRAM;  Surgeon: Sherren Kerns, MD;  Location: [name] OR;  Service: Vascular;  Laterality: Right;   LEFT HEART CATH AND CORONARY ANGIOGRAPHY N/A 03/01/2017   Procedure: LEFT HEART CATH AND CORONARY ANGIOGRAPHY;  Surgeon: Kathleene Hazel, MD;  [facility]: [facility] INV[facility];  Service: Cardiovascular;  Laterality: N/A;   PATCH ANGIOPLASTY Left 03/17/2015   Procedure: VEIN PATCH ANGIOPLASTY COMMON FEMORAL ARTERY;  Surgeon: Sherren Kerns, MD;  Location[name];  Service: Vascular;  Laterality: Left;  PERIPHERAL VASCULAR CATHETERIZATION N/A 01/16/2015   Procedure: Abdominal Aortogram;  Surgeon: Sherren Kerns, MD;  [name];  Service: Cardiovascular;  Laterality: N/A;   SHOULDER OPEN ROTATOR CUFF REPAIR Right 04/23/2013   Procedure: RIGHT TWO TENDON ROTATOR CUFF REPAIR  SUBACROMIAL  DECOMPRESSION AND OPEN DISTAL CLAVICLE RESECTION;  Surgeon: Wyn Forster., MD;  Location: [name];  Service: Orthopedics;  Laterality: Right;   stents placed   2006   TEE WITHOUT CARDIOVERSION N/A 03/02/2017   Procedure: TRANSESOPHAGEAL ECHOCARDIOGRAM (TEE);  Surgeon: Loreli Slot, MD;  Location: [name] OR;  Service: Open Heart Surgery;  Laterality: N/A;   VEIN HARVEST Right 05/11/2015   Procedure: WITH NON REVERSE RIGHT GREATER SAPHENOUS VEIN HARVEST;  Surgeon: Sherren Kerns, MD;  Location: [facility] OR;  Service: Vascular;  Laterality: Right;    Social History   Tobacco Use  Smoking Status Former   Types: Cigarettes   Quit date: 05/30/1989   Years since quitting: 32.4   Passive exposure: Never  Smokeless Tobacco Never    Social History   Substance and Sexual Activity  Alcohol Use Yes   Comment: 1 beer every 6 months    Family History  Problem Relation Age of Onset   Arthritis Mother    Varicose Veins Mother    Lung cancer Father    COPD Father        Lung   Cancer Brother        Lukemia   COPD Brother    Cancer Brother        Prostate    Reviw of Systems:  Reviewed in the HPI.  All other systems are negative.  Physical Exam: Blood pressure (!) 142/60, pulse 68, height 6\' 2"  (1.88 m), weight 220 lb 12.8 oz (100.2 kg), SpO2 95 %.  GEN:  Well nourished, well developed in no acute distress HEENT: Normal NECK: No JVD; No carotid bruits LYMPHATICS: No lymphadenopathy CARDIAC: RRR , no murmurs, rubs, gallops RESPIRATORY:  Clear to auscultation without rales, wheezing or rhonchi  ABDOMEN: Soft, non-tender, non-distended MUSCULOSKELETAL:  No edema; No deformity  SKIN: Warm and dry NEUROLOGIC:  Alert and oriented x 3   ECG:    Assessment / Plan:    1. CAD -       no angian   2.   Hyperlipidemia :      3. HTN: Blood pressure has been elevated.  He eats a fairly high salt diet.  He eats bacon, sausage, hot dogs on a regular basis.  He  eats canned vegetables.  Tried to give wife some recipes for cooking fresh vegetables.  Encouraged lower salt diet. Stop metoprolol.  Start carvedilol 12.5 mg twice a day.  3.   Carotid artery disease:   followed by [facility] / , MD  11/09/2021 2:29 PM    [name] [address] [place],  [address] [place], [place]  [place] Pager [phone] Phone: [phone]; Fax: [phone]

## 2021-11-09 NOTE — Patient Instructions (Signed)
Medication Instructions:  STOP Metoprolol Tartrate START Carvedilol (Coreg) 12.5mg  twice daily *If you need a refill on your cardiac medications before your next appointment, please call your pharmacy*   Lab Work: NONE If you have labs (blood work) drawn today and your tests are completely normal, you will receive your results only by: MyChart Message (if you have MyChart) OR A paper copy in the mail If you have any lab test that is abnormal or we need to change your treatment, we will call you to review the results.   Testing/Procedures: NONE   Follow-Up: At [facility], you and your health needs are our priority.  As part of our continuing mission to provide you with exceptional heart care, we have created designated Provider Care Teams.  These Care Teams include your primary Cardiologist (physician) and Advanced Practice Providers (APPs -  Physician Assistants and Nurse Practitioners) who all work together to provide you with the care you need, when you need it.  Your next appointment:   6 month(s)  The format for your next appointment:   In Person  Provider:   Tereso Newcomer {  Important Information About Sugar

## 2021-11-09 NOTE — Addendum Note (Signed)
Addended by: Lars Mage on: 11/09/2021 03:39 PM   Modules accepted: Orders

## 2021-11-22 ENCOUNTER — Telehealth: Payer: Self-pay | Admitting: Cardiovascular Disease

## 2021-11-22 NOTE — Telephone Encounter (Signed)
Pt c/o BP issue: STAT if pt c/o blurred vision, one-sided weakness or slurred speech  1. What are your last 5 BP readings?  06/25  124/54 06/24  137/54 06/23  157/70 06/22  132/59 06/20  144/57  2. Are you having any other symptoms (ex. Dizziness, headache, blurred vision, passed out)? No  3. What is your BP issue? Was told by Dr. Elease Hashimoto to call in with BP reading after starting new medication. Would like to know if these readings are okay before he goes to pick up more from pharmacy.

## 2021-11-22 NOTE — Telephone Encounter (Signed)
Nahser, Deloris Ping, MD sent to [name] Caller: Unspecified (Today, 10:57 AM) I agree,  These readings look good    Pt made aware that readings are good and we want him to continue on this dose of Carvedilol.

## 2021-11-23 ENCOUNTER — Other Ambulatory Visit: Payer: Self-pay

## 2021-11-23 MED ORDER — HYDROCHLOROTHIAZIDE 25 MG PO TABS: 25.0000 mg | ORAL_TABLET | Freq: Every evening | ORAL | 3 refills | Status: DC

## 2021-11-25 ENCOUNTER — Encounter (HOSPITAL_COMMUNITY): Payer: Medicare Other

## 2021-11-26 ENCOUNTER — Encounter (HOSPITAL_COMMUNITY)
Admission: RE | Admit: 2021-11-26 | Discharge: 2021-11-26 | Disposition: A | Discharge status: Home / Self Care | Payer: Medicare Other | Source: Ambulatory Visit | Attending: Cardiovascular Disease | Admitting: Cardiovascular Disease

## 2021-11-26 DIAGNOSIS — E785 Hyperlipidemia, unspecified: Secondary | ICD-10-CM | POA: Insufficient documentation

## 2021-11-26 MED ORDER — INCLISIRAN SODIUM 284 MG/1.5ML ~~LOC~~ SOSY
PREFILLED_SYRINGE | SUBCUTANEOUS | Status: AC
  Filled 2021-11-26: qty 1.5

## 2021-11-26 MED ORDER — INCLISIRAN SODIUM 284 MG/1.5ML ~~LOC~~ SOSY
284.0000 mg | PREFILLED_SYRINGE | Freq: Once | SUBCUTANEOUS | Status: AC
  Administered 2021-11-26: 284 mg via SUBCUTANEOUS

## 2021-12-01 ENCOUNTER — Other Ambulatory Visit: Payer: Self-pay | Admitting: Cardiovascular Disease

## 2021-12-03 ENCOUNTER — Telehealth: Payer: Self-pay | Admitting: Cardiovascular Disease

## 2021-12-03 MED ORDER — HYDROCHLOROTHIAZIDE 12.5 MG PO CAPS: 12.5000 mg | ORAL_CAPSULE | Freq: Every day | ORAL | 3 refills | Status: DC

## 2021-12-03 NOTE — Telephone Encounter (Signed)
STAT if patient feels like he/she is going to faint   Are you dizzy now? When he stands up and walk around- he have to sit right back down- so lightheaded   Do you feel faint or have you passed out? no  Do you have any other symptoms? Legs get so weak when he stands up and this happen   Have you checked your HR and BP (record if available)? 118/50 and pulse is 54

## 2021-12-03 NOTE — Telephone Encounter (Signed)
Returned call to wife Derrick [name] who states that patient didn't sleep well last night and complained of feeling weak this morning. They did go eat breakfast this morning, but patient was dizzy and unsteady on his feet, so since return home, he has been mostly sitting. Pt denies SOB, CP, headaches, edema, recent diarrhea or vomiting. Endorses that he's maintained hydration and states he drinks 8-10 bottles of water daily. Pt takes BP each evening, BP log provided: 7/1 142/61 7/2 131/57 7/3 137/56 7/4 143/57 7/5 125/55 7/6 142/55 BP check this morning due to pt not feeling well, was 119/54 @0915 . Repeated at 0950=118/50, 1030=123/53, 1100=129/59. Did orthostatics on phone as pt states that he feels fine at rest, symptoms of weakness and dizziness only occur when up and moving around. Laying: 133/58  Sitting:143/56  Standing:151/63. Patient also questions dose of HCTZ. States he was previously on 12.5mg  QD and RX from [facility] on 6/13 was for 25mg  QD. Patient is still currently using up his 12.5mg  capsules and has not started the 25mg  dose yet, but wanted to clarify.  November 09, 2021 Caleb is seen for follow up of his CAD / CABG  Multiple complaints Not sleeping well Amlodipine has been increased Hydralazine was added, then stopped  HCTZ was restarted  Likely eats more salt than he should  Rides stationary bike, does yard work , once a week .  No CP , no dyspnea  Does not eat fresh veggies ( wife uses canned )  Lots of stiffness  Routing to MD for recommendations of symptoms and med dose clarification.

## 2021-12-03 NOTE — Telephone Encounter (Signed)
[name], Derrick Ping, MD sent to [name] Caller: Unspecified (Today, 10:07 AM) His BP is basically normal  and is not the cause of his weakness / lightheadedness  It may be related to the heat  Make sure he is adding some electrolytes to several of his servings of water   Since his BP looks ok  It will be fine to continue his HCTZ 12.5 mg a day   Have him continue to watch his BP and HR  PN   Called and spoke with patient to review above recommendations. He will substitute some of his water intake with a sugar free powerade or gatorade or seek nuun tablets. Pt requests that we send an rx to pharmacy for the 12.5mg  HCTZ instead of trying to cut the 25's. Will do at this time.

## 2021-12-05 ENCOUNTER — Other Ambulatory Visit: Payer: Self-pay | Admitting: Cardiovascular Disease

## 2021-12-07 NOTE — Telephone Encounter (Signed)
Pt's pharmacy is requesting a refill on pravastatin. Would Dr. Nahser like to refill this medication? Please address °

## 2021-12-09 NOTE — Telephone Encounter (Signed)
Emeline Darling Supple, RPH-CPP  07/26/2021  9:51 AM EST Back to Top    LDL improved after starting Leqvio. Already on low dose pravastatin, ezetimibe, and fenofibrate. No med changes needed given prior intolerances. Results released to pt.  Refill sent to pharmacy on file.

## 2021-12-27 ENCOUNTER — Ambulatory Visit: Payer: Medicare Other | Admitting: Cardiovascular Disease

## 2021-12-31 ENCOUNTER — Ambulatory Visit: Payer: Medicare Other | Admitting: Cardiovascular Disease

## 2022-02-22 ENCOUNTER — Other Ambulatory Visit: Payer: Self-pay | Admitting: Cardiovascular Disease

## 2022-02-23 MED ORDER — AMLODIPINE BESYLATE 10 MG PO TABS: 10.0000 mg | ORAL_TABLET | Freq: Every day | ORAL | 2 refills | Status: DC

## 2022-03-13 ENCOUNTER — Other Ambulatory Visit: Payer: Self-pay | Admitting: Cardiovascular Disease

## 2022-04-22 ENCOUNTER — Other Ambulatory Visit (HOSPITAL_COMMUNITY): Payer: Self-pay | Admitting: Otolaryngology

## 2022-04-22 DIAGNOSIS — R1314 Dysphagia, pharyngoesophageal phase: Secondary | ICD-10-CM

## 2022-05-03 ENCOUNTER — Ambulatory Visit (HOSPITAL_COMMUNITY)
Admission: RE | Admit: 2022-05-03 | Discharge: 2022-05-03 | Disposition: A | Discharge status: Home / Self Care | Payer: Medicare Other | Source: Ambulatory Visit | Attending: Otolaryngology | Admitting: Otolaryngology

## 2022-05-03 DIAGNOSIS — R1314 Dysphagia, pharyngoesophageal phase: Secondary | ICD-10-CM | POA: Insufficient documentation

## 2022-05-12 NOTE — Progress Notes (Signed)
Cardiology Office Note:    Date:  05/13/2022   ID:  Derrick [name], DOB 1956/03/17, MRN [id number]  PCP:  Kristen Loader, [place] Providers Cardiologist:  Mertie Moores, MD Cardiology APP:  Sharmon Revere     Referring MD: Kristen Loader, [name]   Chief Complaint:  Leg Swelling    Patient Profile: Coronary artery disease  Hx PCI to LAD and LCx S/p CABG in 2018 (c/b Post op AFib) TTE 02/12/21: EF 55-60, GLS -24.5, NL RVSF, RVSP 30.7, mild LAE, trivial MR, trivial AI, AV sclerosis w/o AS, RAP 3 Myoview 01/28/21: inf scar, no ischemia, EF 61; low risk  Peripheral arterial disease  S/p L Fem-Post Tib bypass 2016 S/p R Fem-Pop 2016 S/p bilat CIA stents in 2016 Carotid artery Dz [place] 10/14/21: bilat ICA 1-39, R subcl stenosis Hyperlipidemia  Hypertension  Chronic kidney disease  Sinus brady    History of Present Illness:   Derrick [name] is a 66 y.o. male with the above problem list.  He was last seen by Dr. Acie Fredrickson on 11/09/21. He returns for f/u.  He is here today with his wife.  Over the past 6 months, he has noted lower extremity edema.  His left leg is always bigger than his right.  His amlodipine was increased from 5 mg to 10 mg around that time.  His weight is up about 15 pounds since that time.  He has noted shortness of breath with exertion (going up an incline).  This is fairly new for him.  He has not had orthopnea, PND.  He has not had chest pain or syncope.  He does not smoke.    EKG: Sinus bradycardia, HR 53, normal axis, nonspecific ST-T wave changes, QTc 407, similar to prior tracings    Reviewed and updated this encounter:  Tobacco  Allergies  Meds  Problems  Med Hx  Surg Hx  Fam Hx     Review of Systems  Respiratory:  Positive for wheezing. Negative for cough.   Gastrointestinal:  Negative for hematochezia and melena.  Genitourinary:  Negative for hematuria.    Labs/Other Test Reviewed:    Labs obtained through [facility] -  personally reviewed and interpreted: 06/10/2021: TSH 2.43 01/10/2022: Hgb 11.4 02/01/2022: Creatinine 2.02, K+ 4.7, ALT 16  Recent Lipid Panel Recent Labs    07/23/21 0813  CHOL 162  TRIG 133  HDL 61  LDLCALC 78    Risk Assessment/Calculations/Metrics:             Physical Exam:   VS:  BP 116/62   Pulse (!) 53   Ht _0  (1.88 m)   Wt 233 lb 9.6 oz (106 kg)   SpO2 95%   BMI 29.99 kg/m    Wt Readings from Last 3 Encounters:  05/13/22 233 lb 9.6 oz (106 kg)  11/09/21 220 lb 12.8 oz (100.2 kg)  10/14/21 219 lb 3.2 oz (99.4 kg)    Constitutional:      Appearance: Healthy appearance. Not in distress.  Neck:     Vascular: No JVR. JVD normal.  Pulmonary:     Effort: Pulmonary effort is normal.     Breath sounds: No wheezing. No rales.  Cardiovascular:     Normal rate. Regular rhythm. Normal S1. Normal S2.      Murmurs: There is no murmur.  Edema:    Peripheral edema present.    Pretibial: bilateral trace edema of the pretibial area.  Ankle: bilateral 1+ edema of the ankle.    Comments: L>R Abdominal:     Palpations: There is no hepatomegaly.          ASSESSMENT & PLAN:   Coronary artery disease S/p prior PCI to LAD and LCx. S/p CABG in 2018.  EF normal on echo in September 2022.  Nuclear stress test in September 2022 positive for scar but no ischemia (low risk).  He is not having chest discomfort to suggest angina.  He has been short of breath and has noted some swelling.  As outlined, he will undergo follow-up echocardiogram.  Continue aspirin 81 mg daily, Zetia 10 mg daily, pravastatin 20 mg daily, Leqvio every 6 months.  PAD (peripheral artery disease) ([place]) He continues to follow-up with vascular surgery.  Lower extremity edema He has noted lower extremity edema over the past 6 months.  He has also gained about 15 pounds and has been experiencing dyspnea with exertion (NYHA II-IIb).  His lungs are clear on exam.  His neck veins are flat.  Question if his lower  extremity edema could be related to amlodipine.  However, given his shortness of breath and weight gain, I am still concerned about heart failure.  His EF was normal on echocardiogram in September 2022.  He does have chronic kidney disease which will make it difficult for him to regulate volume. Obtain follow-up echocardiogram BMET, BNP, CBC, TSH DC HCTZ Decrease amlodipine to 5 mg daily Start furosemide 40 mg daily BMET 1 week Follow-up 2 to 3 weeks  SOB (shortness of breath) As noted, I will obtain labs to include a BNP as well as repeat his echocardiogram to reassess LV function.  I will stop his HCTZ and place him on furosemide 40 mg daily.  Follow-up in 2 to 3 weeks as noted.  Hypertension As noted, amlodipine will be decreased to 5 mg daily to see if this helps his swelling.  I have asked him to monitor his blood pressure.  If his blood pressure starts to increase, we may need to consider adding hydralazine.  Continue losartan 100 mg daily, carvedilol 12.5 mg twice daily.  As noted, stop HCTZ and start furosemide 40 mg daily.  Chronic kidney disease He is followed by Dr. Royce Macadamia with nephrology.  As noted, I will stop his HCTZ and place him on furosemide 40 mg daily.  Obtain BMET today and again in 1 week to keep a close eye on his renal function and potassium.  Hyperlipidemia The pharmacist at his primary care provider's office has asked if we can simplify his regimen for lipid management.  He is on fish oil and fenofibrate in addition to pravastatin, Zetia and Leqvio.  I have recommended that we stop his fenofibrate.  Obtain follow-up CMET and lipids in 4 months.           Dispo:  Return in about 3 weeks (around 06/03/2022) for Follow up after testing, w/ Dr. Acie Fredrickson, or Richardson Dopp, PA-C.  Medication Adjustments/Labs and Tests Ordered: Current medicines are reviewed at length with the patient today.  Concerns regarding medicines are outlined above.  Tests Ordered: Orders Placed This  Encounter  Procedures   Basic metabolic panel   Pro b natriuretic peptide (BNP)   CBC   TSH   Basic metabolic panel   Comp Met (CMET)   Lipid panel   EKG 12-Lead   ECHOCARDIOGRAM COMPLETE   Medication Changes: Meds ordered this encounter  Medications   amLODipine (NORVASC) 5 MG  tablet    Sig: Take 1 tablet (5 mg total) by mouth daily.    Dispense:  90 tablet    Refill:  3   DISCONTD: furosemide (LASIX) 40 MG tablet    Sig: Take 1 tablet (40 mg total) by mouth daily.    Dispense:  90 tablet    Refill:  3   nitroGLYCERIN (NITROSTAT) 0.4 MG SL tablet    Sig: Place 1 tablet (0.4 mg total) under the tongue every 5 (five) minutes as needed for chest pain.    Dispense:  25 tablet    Refill:  3   furosemide (LASIX) 40 MG tablet    Sig: Take 1 tablet (40 mg total) by mouth daily.    Dispense:  90 tablet    Refill:  3   Signed, Richardson Dopp, PA-C  05/13/2022 10:19 AM    [facility] [address], [place], [place]  [address] Phone: [phone]; Fax: [phone]

## 2022-05-13 ENCOUNTER — Encounter: Payer: Self-pay | Admitting: Physician Assistant

## 2022-05-13 ENCOUNTER — Ambulatory Visit: Payer: Medicare Other | Attending: Physician Assistant | Admitting: Physician Assistant

## 2022-05-13 VITALS — BP 116/62 | HR 53 | Ht 74.0 in | Wt 233.6 lb

## 2022-05-13 DIAGNOSIS — I10 Essential (primary) hypertension: Secondary | ICD-10-CM

## 2022-05-13 DIAGNOSIS — N1832 Chronic kidney disease, stage 3b: Secondary | ICD-10-CM | POA: Insufficient documentation

## 2022-05-13 DIAGNOSIS — I739 Peripheral vascular disease, unspecified: Secondary | ICD-10-CM | POA: Diagnosis not present

## 2022-05-13 DIAGNOSIS — I2510 Atherosclerotic heart disease of native coronary artery without angina pectoris: Secondary | ICD-10-CM | POA: Insufficient documentation

## 2022-05-13 DIAGNOSIS — R0602 Shortness of breath: Secondary | ICD-10-CM | POA: Insufficient documentation

## 2022-05-13 DIAGNOSIS — R600 Localized edema: Secondary | ICD-10-CM | POA: Diagnosis not present

## 2022-05-13 DIAGNOSIS — E782 Mixed hyperlipidemia: Secondary | ICD-10-CM | POA: Insufficient documentation

## 2022-05-13 DIAGNOSIS — I779 Disorder of arteries and arterioles, unspecified: Secondary | ICD-10-CM

## 2022-05-13 LAB — CBC

## 2022-05-13 MED ORDER — AMLODIPINE BESYLATE 5 MG PO TABS: 5.0000 mg | ORAL_TABLET | Freq: Every day | ORAL | 3 refills | Status: DC

## 2022-05-13 MED ORDER — FUROSEMIDE 40 MG PO TABS: 40.0000 mg | ORAL_TABLET | Freq: Every day | ORAL | 3 refills | Status: DC

## 2022-05-13 MED ORDER — NITROGLYCERIN 0.4 MG SL SUBL: 0.4000 mg | SUBLINGUAL_TABLET | SUBLINGUAL | 3 refills | Status: DC | PRN

## 2022-05-13 NOTE — Assessment & Plan Note (Signed)
As noted, amlodipine will be decreased to 5 mg daily to see if this helps his swelling.  I have asked him to monitor his blood pressure.  If his blood pressure starts to increase, we may need to consider adding hydralazine.  Continue losartan 100 mg daily, carvedilol 12.5 mg twice daily.  As noted, stop HCTZ and start furosemide 40 mg daily.

## 2022-05-13 NOTE — Assessment & Plan Note (Signed)
As noted, I will obtain labs to include a BNP as well as repeat his echocardiogram to reassess LV function.  I will stop his HCTZ and place him on furosemide 40 mg daily.  Follow-up in 2 to 3 weeks as noted.

## 2022-05-13 NOTE — Assessment & Plan Note (Signed)
He continues to follow-up with vascular surgery. 

## 2022-05-13 NOTE — Assessment & Plan Note (Signed)
The pharmacist at his primary care provider's office has asked if we can simplify his regimen for lipid management.  He is on fish oil and fenofibrate in addition to pravastatin, Zetia and Leqvio.  I have recommended that we stop his fenofibrate.  Obtain follow-up CMET and lipids in 4 months.

## 2022-05-13 NOTE — Assessment & Plan Note (Signed)
He is followed by Dr. Malen Gauze with nephrology.  As noted, I will stop his HCTZ and place him on furosemide 40 mg daily.  Obtain BMET today and again in 1 week to keep a close eye on his renal function and potassium.

## 2022-05-13 NOTE — Assessment & Plan Note (Addendum)
S/p prior PCI to LAD and LCx. S/p CABG in 2018.  EF normal on echo in September 2022.  Nuclear stress test in September 2022 positive for scar but no ischemia (low risk).  He is not having chest discomfort to suggest angina.  He has been short of breath and has noted some swelling.  As outlined, he will undergo follow-up echocardiogram.  Continue aspirin 81 mg daily, Zetia 10 mg daily, pravastatin 20 mg daily, Leqvio every 6 months.

## 2022-05-13 NOTE — Patient Instructions (Signed)
Medication Instructions:  Your physician has recommended you make the following change in your medication:   STOP Fenofibrate  STOP HCTZ  START Lasix 40 mg taking 1 daily  REDUCE Amlodipine to 5 mg taking 1 daily   *If you need a refill on your cardiac medications before your next appointment, please call your pharmacy*   Lab Work: TODAY:  BMET, CBC, PRO BNP, & TSH  1 WEEK:  BMET  4 MONTHS: FASTING LIPID & CMET  If you have labs (blood work) drawn today and your tests are completely normal, you will receive your results only by: MyChart Message (if you have MyChart) OR A paper copy in the mail If you have any lab test that is abnormal or we need to change your treatment, we will call you to review the results.   Testing/Procedures: Your physician has requested that you have an echocardiogram. Echocardiography is a painless test that uses sound waves to create images of your heart. It provides your doctor with information about the size and shape of your heart and how well your heart's chambers and valves are working. This procedure takes approximately one hour. There are no restrictions for this procedure. Please do NOT wear cologne, perfume, aftershave, or lotions (deodorant is allowed). Please arrive 15 minutes prior to your appointment time.   Follow-Up: At [facility], you and your health needs are our priority.  As part of our continuing mission to provide you with exceptional heart care, we have created designated Provider Care Teams.  These Care Teams include your primary Cardiologist (physician) and Advanced Practice Providers (APPs -  Physician Assistants and Nurse Practitioners) who all work together to provide you with the care you need, when you need it.  We recommend signing up for the patient portal called "MyChart".  Sign up information is provided on this After Visit Summary.  MyChart is used to connect with patients for Virtual Visits (Telemedicine).   Patients are able to view lab/test results, encounter notes, upcoming appointments, etc.  Non-urgent messages can be sent to your provider as well.   To learn more about what you can do with MyChart, go to [email or web address].    Your next appointment:   AFTER ECHO  The format for your next appointment:   In Person  Provider:   Kristeen Miss, MD  or Tereso Newcomer, PA-C         Other Instructions   Important Information About Sugar

## 2022-05-13 NOTE — Assessment & Plan Note (Addendum)
He has noted lower extremity edema over the past 6 months.  He has also gained about 15 pounds and has been experiencing dyspnea with exertion (NYHA II-IIb).  His lungs are clear on exam.  His neck veins are flat.  Question if his lower extremity edema could be related to amlodipine.  However, given his shortness of breath and weight gain, I am still concerned about heart failure.  His EF was normal on echocardiogram in September 2022.  He does have chronic kidney disease which will make it difficult for him to regulate volume. Obtain follow-up echocardiogram BMET, BNP, CBC, TSH DC HCTZ Decrease amlodipine to 5 mg daily Start furosemide 40 mg daily BMET 1 week Follow-up 2 to 3 weeks

## 2022-05-14 LAB — BASIC METABOLIC PANEL
BUN/Creatinine Ratio: 17 (ref 10–24)
BUN: 31 mg/dL — ABNORMAL HIGH (ref 8–27)
CO2: 25 mmol/L (ref 20–29)
Calcium: 9.9 mg/dL (ref 8.6–10.2)
Chloride: 100 mmol/L (ref 96–106)
Creatinine, Ser: 1.78 mg/dL — ABNORMAL HIGH (ref 0.76–1.27)
Glucose: 97 mg/dL (ref 70–99)
Potassium: 4.6 mmol/L (ref 3.5–5.2)
Sodium: 139 mmol/L (ref 134–144)
eGFR: 41 mL/min/{1.73_m2} — ABNORMAL LOW (ref >59)

## 2022-05-14 LAB — CBC
Hematocrit: 37.4 % — ABNORMAL LOW (ref 37.5–51.0)
Hemoglobin: 12.4 g/dL — ABNORMAL LOW (ref 13.0–17.7)
MCH: 28.8 pg (ref 26.6–33.0)
MCHC: 33.2 g/dL (ref 31.5–35.7)
MCV: 87 fL (ref 79–97)
Platelets: 343 10*3/uL (ref 150–450)
RBC: 4.3 x10E6/uL (ref 4.14–5.80)
RDW: 12.3 % (ref 11.6–15.4)
WBC: 6 10*3/uL (ref 3.4–10.8)

## 2022-05-14 LAB — TSH: TSH: 2.38 u[IU]/mL (ref 0.450–4.500)

## 2022-05-14 LAB — PRO B NATRIURETIC PEPTIDE: NT-Pro BNP: 293 pg/mL (ref 0–376)

## 2022-05-16 ENCOUNTER — Telehealth: Payer: Self-pay | Admitting: *Deleted

## 2022-05-16 MED ORDER — FUROSEMIDE 40 MG PO TABS: 20.0000 mg | ORAL_TABLET | Freq: Every day | ORAL | 3 refills | Status: DC

## 2022-05-16 NOTE — Telephone Encounter (Signed)
-----   Message from Beatrice Lecher, PA-C sent at 05/16/2022  9:28 AM EST ----- Creatinine stable.  K+, TSH normal.  Hemoglobin mildly low but stable.  BNP normal. PLAN:  -After taking furosemide 40 mg daily for 5 days, reduce to 20 mg daily -Repeat BMET pending on Friday 12/22 Tereso Newcomer, PA-C    05/16/2022 9:25 AM

## 2022-05-20 ENCOUNTER — Ambulatory Visit: Payer: Medicare Other | Attending: Physician Assistant

## 2022-05-20 DIAGNOSIS — I10 Essential (primary) hypertension: Secondary | ICD-10-CM

## 2022-05-20 DIAGNOSIS — I2510 Atherosclerotic heart disease of native coronary artery without angina pectoris: Secondary | ICD-10-CM

## 2022-05-20 DIAGNOSIS — R600 Localized edema: Secondary | ICD-10-CM

## 2022-05-20 DIAGNOSIS — I739 Peripheral vascular disease, unspecified: Secondary | ICD-10-CM

## 2022-05-20 DIAGNOSIS — R0602 Shortness of breath: Secondary | ICD-10-CM

## 2022-05-20 LAB — BASIC METABOLIC PANEL
BUN/Creatinine Ratio: 20 (ref 10–24)
BUN: 35 mg/dL — ABNORMAL HIGH (ref 8–27)
CO2: 25 mmol/L (ref 20–29)
Calcium: 9.7 mg/dL (ref 8.6–10.2)
Chloride: 100 mmol/L (ref 96–106)
Creatinine, Ser: 1.71 mg/dL — ABNORMAL HIGH (ref 0.76–1.27)
Glucose: 110 mg/dL — ABNORMAL HIGH (ref 70–99)
Potassium: 4.3 mmol/L (ref 3.5–5.2)
Sodium: 141 mmol/L (ref 134–144)
eGFR: 43 mL/min/{1.73_m2} — ABNORMAL LOW (ref >59)

## 2022-05-25 ENCOUNTER — Other Ambulatory Visit: Payer: Self-pay | Admitting: Pharmacist

## 2022-05-25 ENCOUNTER — Telehealth: Payer: Self-pay | Admitting: Cardiovascular Disease

## 2022-05-25 DIAGNOSIS — I2510 Atherosclerotic heart disease of native coronary artery without angina pectoris: Secondary | ICD-10-CM

## 2022-05-25 NOTE — Telephone Encounter (Signed)
Order has been entered for Leqvio injection on 12/29.

## 2022-05-25 NOTE — Telephone Encounter (Signed)
New Message:    Derrick [name] says she needs an order for [facility] please.

## 2022-05-27 ENCOUNTER — Ambulatory Visit (HOSPITAL_COMMUNITY)
Admission: RE | Admit: 2022-05-27 | Discharge: 2022-05-27 | Disposition: A | Discharge status: Home / Self Care | Payer: Medicare Other | Source: Ambulatory Visit | Attending: Cardiovascular Disease | Admitting: Cardiovascular Disease

## 2022-05-27 DIAGNOSIS — I2510 Atherosclerotic heart disease of native coronary artery without angina pectoris: Secondary | ICD-10-CM | POA: Diagnosis present

## 2022-05-27 MED ORDER — INCLISIRAN SODIUM 284 MG/1.5ML ~~LOC~~ SOSY
PREFILLED_SYRINGE | SUBCUTANEOUS | Status: AC
  Filled 2022-05-27: qty 1.5

## 2022-05-27 MED ORDER — INCLISIRAN SODIUM 284 MG/1.5ML ~~LOC~~ SOSY
284.0000 mg | PREFILLED_SYRINGE | Freq: Once | SUBCUTANEOUS | Status: AC
  Administered 2022-05-27: 284 mg via SUBCUTANEOUS

## 2022-06-09 ENCOUNTER — Ambulatory Visit (HOSPITAL_COMMUNITY): Payer: Medicare Other | Attending: Physician Assistant

## 2022-06-09 DIAGNOSIS — I10 Essential (primary) hypertension: Secondary | ICD-10-CM | POA: Insufficient documentation

## 2022-06-09 DIAGNOSIS — I2510 Atherosclerotic heart disease of native coronary artery without angina pectoris: Secondary | ICD-10-CM | POA: Insufficient documentation

## 2022-06-09 DIAGNOSIS — R0602 Shortness of breath: Secondary | ICD-10-CM | POA: Insufficient documentation

## 2022-06-09 DIAGNOSIS — I739 Peripheral vascular disease, unspecified: Secondary | ICD-10-CM | POA: Diagnosis present

## 2022-06-09 DIAGNOSIS — R600 Localized edema: Secondary | ICD-10-CM

## 2022-06-09 LAB — ECHOCARDIOGRAM COMPLETE
Area-P 1/2: 3.78 cm2
S' Lateral: 3.9 cm

## 2022-06-23 ENCOUNTER — Ambulatory Visit: Payer: Medicare Other | Attending: Cardiovascular Disease | Admitting: Cardiovascular Disease

## 2022-06-23 ENCOUNTER — Encounter: Payer: Self-pay | Admitting: Cardiovascular Disease

## 2022-06-23 VITALS — BP 136/68 | HR 63 | Ht 74.0 in | Wt 237.2 lb

## 2022-06-23 DIAGNOSIS — I25700 Atherosclerosis of coronary artery bypass graft(s), unspecified, with unstable angina pectoris: Secondary | ICD-10-CM | POA: Diagnosis present

## 2022-06-23 DIAGNOSIS — I10 Essential (primary) hypertension: Secondary | ICD-10-CM | POA: Diagnosis present

## 2022-06-23 NOTE — Progress Notes (Signed)
Derrick [name] Date of Birth  23-Jan-1956 [facility] [address] [place], [place]  [address] [phone]  Fax  [phone]  Problem List: 1. CAD, status post stents to the LAD,  left circumflex artery 2006 . His chronically occluded right coronary artery S/p CABG ( bilateral IMA )  2. hyperlipidemia-intolerant to statins 3. Hypertension 4. Peripheral Vascular disease    67 yo male with history of CAD - s/p stents to LAD and LCx.  Chronically occluded RCA.  No angina.  He eats a very fatty diet - lots of fried foods.  Not exercising.  He does lots of yard work and has never had any episodes of angina.  Oct 01, 2013:  Derrick [name] was seen by [name] in Oct. 2014 for pre-op eval prior to rotator cuff surgery.    His myoview study showed:  Low risk stress nuclear study with a large, severe intensity, partially reversible inferior defect consistent with prior inferior infarct and very mild peri-infarct ischemia.  LV Ejection Fraction: 54%. LV Wall Motion: Inferior hypokinesis.  He has shoulder surgery without difficulty.  He's doing well. He denies any chest pain or shortness of breath.  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     Feb. 12 2018:    Has done well.  Has hx of PVD - Ruta Hinds)   Oct. 1 2018;    Derrick [name] has been having some DOE , chest pain with exertion Multiple occasions over the past month,,   Gradually getting worse   Occurred while he was pruning and cleaning up his yard.  Is exercising more.  Works 11 hours a day . 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  Symptoms feel very similar to his previous symptoms in 2006  May 05, 2017:  Derrick [name] is doing well.  He had a cath in October that showed significant left [name] disease as well as other narrowings.  He had coronary artery bypass grafting.  He is done well since that time.  He had some postoperative atrial fibrillation.  He is on Cardizem 120 mg a day  as well as metoprolol 25 mg twice a day.  He was not started on anticoagulation.  August 31, 2017: Derrick [name] is seen today for follow-up visit.  He has a history of coronary artery bypass grafting with postoperative atrial fibrillation.  History of hypertension and hyperlipidemia.  Has gained some weight.  Walks some  No CP ,   Lipids are elevatee.  intolerent to statins   Sept. 26, 2019: Doing well since his CABG in Oct. 2018 Was noted to have very minimal carotid artery disease at that time. Walks some .   9000-10000 steps a day  Still works  Hydrologist )   Is in the Clear trial .     July 22, 2019  Hx of CABG in 2018.  No further episode so aF recently  Still at [employer] to retire.soon .    Got his shingles shot Advised him to delay his covid shot for several weeks.   Feb, 18, 2022:  Derrick [name] is seen today for follow up of his CAD /CABG. BP is mildly elevated.  Goes out to eat every morning at [employer] from primary MD are reviewed.  Trigs are > 500, LDL is 48  November 09, 2021 Derrick [name] is seen for follow up of his CAD / CABG  Multiple complaints Not sleeping well  Amlodipine has been increased Hydralazine was added, then stopped  HCTZ was restarted   Likely eats more salt than he should  Rides stationary bike, does yard work , Sales executive once a week .   No CP , no dyspnea   Does not eat fresh veggies ( wife uses canned )   Lots of stiffness  June 23, 2022:  BP is better  Is watching his salt intake  Leg swelling is better  HCTZ was changed to lasix  On Coreg.   Current Outpatient Medications  Medication Sig Dispense Refill   acetaminophen (TYLENOL) 650 MG CR tablet Take 1,300 mg by mouth in the morning and at bedtime.     albuterol (VENTOLIN HFA) 108 (90 Base) MCG/ACT inhaler Inhale 2 puffs into the lungs every 4 (four) hours as needed for wheezing or shortness of breath.     amLODipine (NORVASC) 5 MG tablet Take 1 tablet (5 mg total) by  mouth daily. 90 tablet 3   aspirin 81 MG EC tablet 81 mg daily.     calcitRIOL (ROCALTROL) 0.5 MCG capsule Take 0.5 mcg by mouth daily.     Calcium Carbonate-Vit D-Min (CALCIUM 1200 PO) Take by mouth daily.     carvedilol (COREG) 12.5 MG tablet TAKE 1 TABLET BY MOUTH 2 TIMES DAILY. 180 tablet 3   enoxaparin (LOVENOX) 40 MG/0.4ML injection Inject 0.4 mLs (40 mg total) into the skin daily for 10 days. 10 day supply 1 injection per day 4 mL 0   ezetimibe (ZETIA) 10 MG tablet Take 10 mg by mouth every evening.     famotidine (PEPCID) 20 MG tablet Take 20 mg by mouth 2 (two) times daily.     furosemide (LASIX) 40 MG tablet Take 0.5 tablets (20 mg total) by mouth daily. 45 tablet 3   gabapentin (NEURONTIN) 300 MG capsule Take 300 mg by mouth at bedtime.     ipratropium (ATROVENT HFA) 17 MCG/ACT inhaler Inhale 2 puffs into the lungs every 6 (six) hours as needed.     ipratropium (ATROVENT) 0.03 % nasal spray Place 1 spray into both nostrils 2 (two) times daily as needed for rhinitis.     losartan (COZAAR) 100 MG tablet Take 100 mg by mouth every evening.      Magnesium 250 MG TABS Take 1 tablet by mouth daily in the afternoon.     nitroGLYCERIN (NITROSTAT) 0.4 MG SL tablet Place 1 tablet (0.4 mg total) under the tongue every 5 (five) minutes as needed for chest pain. 25 tablet 3   Omega-3 Fatty Acids (FISH OIL PO) Take 1,605 mg by mouth daily in the afternoon.     potassium chloride (KLOR-CON) 10 MEQ tablet TAKE 1 TABLET EVERY DAY 90 tablet 2   pravastatin (PRAVACHOL) 20 MG tablet TAKE 1 TABLET EVERY EVENING 90 tablet 2   Propylene Glycol (SYSTANE BALANCE) 0.6 % SOLN Place 1 drop into both eyes daily as needed (dry eyes).     tamsulosin (FLOMAX) 0.4 MG CAPS capsule Take 0.4 mg by mouth 2 (two) times daily.     TART CHERRY PO Take 3,000 mg by mouth daily in the afternoon.     traZODone (DESYREL) 50 MG tablet Take 50 mg by mouth at bedtime.     vitamin B-12 (CYANOCOBALAMIN) 500 MCG tablet Take 500 mcg  by mouth daily.     Current Facility-Administered Medications  Medication Dose Route Frequency Provider Last Rate Last Admin   inclisiran (LEQVIO)  injection 284 mg  284 mg Subcutaneous Once Reiley Keisler, Wonda Cheng, MD         Allergies  Allergen Reactions   Codeine Itching and Other (See Comments)   Atorvastatin Other (See Comments)    Muscle aches Muscle aches   Crestor [Rosuvastatin Calcium]     Muscle aches   Meloxicam Other (See Comments)    Can not take due to kidneys   Statins Other (See Comments)    myalgia    Past Medical History:  Diagnosis Date   Chronic kidney disease    "mild"-from taking meloxicam for many years.   Complication of anesthesia    "I was slow to wake up"   Coronary artery disease 2006   stents x2=LAD LCX; totally occluded RCA b. CABG 03/02/17 x2 (left internal mammary artery to LAD, Y-graft right mammary from left mammary to OM).// Myoview 9/22: Inferior infarct, no ischemia, EF 61; low risk // Echocardiogram 9/22: EF 55-60, no RWMA, GLS -24.5%, normal RVSF, RVSP 30.7, mild LAE, trivial MR, trivial AI, mild AV sclerosis w/o AS   Dyslipidemia    GERD (gastroesophageal reflux disease)    History of bronchitis    Hyperlipidemia    statin intolerant   Hyperparathyroidism    Hypertension    Insomnia    Metatarsalgia    Neuropathy    OA (osteoarthritis)    Peripheral vascular disease (HCC)    Seasonal allergies    Urinary frequency    Wears glasses     Past Surgical History:  Procedure Laterality Date   ABDOMINAL EXPOSURE N/A 03/31/2021   Procedure: ABDOMINAL EXPOSURE;  Surgeon: Marty Heck, MD;  [facility]: [facility];  Service: Vascular;  Laterality: N/A;   ANTERIOR LUMBAR FUSION N/A 03/31/2021   Procedure: ANTERIOR LUMBAR FUSION 1 LEVEL LUMBAR FIVE TO SACRAL ONE;  Surgeon: Melina Schools, MD;  Location: [place];  Service: Orthopedics;  Laterality: N/A;  3 C-BED  LEFT TAP BLOCK WITH EXPAREL   CARDIAC CATHETERIZATION  10/28/2004   circ and mid LAD  chyper stents   CERVICAL FUSION  2007,2009   CERVICAL SPINE SURGERY  2004   COLONOSCOPY     CORONARY ANGIOPLASTY  2006   stents placed    CORONARY ARTERY BYPASS GRAFT N/A 03/02/2017   Procedure: CORONARY ARTERY BYPASS GRAFTING (CABG), ON PUMP, TIMES TWO, USING BILATERAL MAMMARY ARTERIES;  Surgeon: Melrose Nakayama, MD;  Location: [place];  Service: Open Heart Surgery;  Laterality: N/A;   ELBOW ARTHROPLASTY  1994   right   ENDARTERECTOMY FEMORAL Left 03/17/2015   Procedure: ENDARTERECTOMY COMMON FEMORAL WITH PROFUNDOPLASTY;  Surgeon: Elam Dutch, MD;  Location: [place];  Service: Vascular;  Laterality: Left;   FEMORAL-POPLITEAL BYPASS GRAFT Left 03/17/2015   Procedure: BYPASS GRAFT FEMORAL-POPLITEAL ARTERY-LEFT LEG AND POSTERIOR TIBIAL SEQUENTIAL GRAFT TO BELOW KNEE POPLITEAL ARTERY;  Surgeon: Elam Dutch, MD;  Location: [place];  Service: Vascular;  Laterality: Left;   FEMORAL-POPLITEAL BYPASS GRAFT Right 05/11/2015   Procedure:  RIGHT FEMORAL-BELOW THE KNEE POPLITEAL ARTERY BYPASS GRAFT USING NON REVERSE RIGHT GREATER SAPHENOUS VEIN;  Surgeon: Elam Dutch, MD;  Location: [place];  Service: Vascular;  Laterality: Right;   INCISION AND DRAINAGE ABSCESS POSTERIOR CERVICALSPINE  2009   INTRAOPERATIVE ARTERIOGRAM Left 03/17/2015   Procedure: INTRA OPERATIVE ARTERIOGRAM X2;  Surgeon: Elam Dutch, MD;  Location: [place];  Service: Vascular;  Laterality: Left;   INTRAOPERATIVE ARTERIOGRAM Right 05/11/2015   Procedure: INTRA OPERATIVE ARTERIOGRAM;  Surgeon: Jessy Oto  Fields, MD;  [facility];  Service: Vascular;  Laterality: Right;   LEFT HEART CATH AND CORONARY ANGIOGRAPHY N/A 03/01/2017   Procedure: LEFT HEART CATH AND CORONARY ANGIOGRAPHY;  Surgeon: Kathleene Hazel, MD;  [facility];  Service: Cardiovascular;  Laterality: N/A;   PATCH ANGIOPLASTY Left 03/17/2015   Procedure: VEIN PATCH ANGIOPLASTY COMMON FEMORAL ARTERY;  Surgeon: Sherren Kerns, MD;   Location: [facility] OR;  Service: Vascular;  Laterality: Left;   PERIPHERAL VASCULAR CATHETERIZATION N/A 01/16/2015   Procedure: Abdominal Aortogram;  Surgeon: Sherren Kerns, MD;  Location: [facility] INV[facility] CV [facility];  Service: Cardiovascular;  Laterality: N/A;   SHOULDER OPEN ROTATOR CUFF REPAIR Right 04/23/2013   Procedure: RIGHT TWO TENDON ROTATOR CUFF REPAIR  SUBACROMIAL DECOMPRESSION AND OPEN DISTAL CLAVICLE RESECTION;  Surgeon: Wyn Forster., MD;  Location: [facility];  Service: Orthopedics;  Laterality: Right;   stents placed   2006   TEE WITHOUT CARDIOVERSION N/A 03/02/2017   Procedure: TRANSESOPHAGEAL ECHOCARDIOGRAM (TEE);  Surgeon: Loreli Slot, MD;  Location: [facility] OR;  Service: Open Heart Surgery;  Laterality: N/A;   VEIN HARVEST Right 05/11/2015   Procedure: WITH NON REVERSE RIGHT GREATER SAPHENOUS VEIN HARVEST;  Surgeon: Sherren Kerns, MD;  Location: [facility] OR;  Service: Vascular;  Laterality: Right;    Social History   Tobacco Use  Smoking Status Former   Types: Cigarettes   Quit date: 05/30/1989   Years since quitting: 33.0   Passive exposure: Never  Smokeless Tobacco Never    Social History   Substance and Sexual Activity  Alcohol Use Yes   Comment: 1 beer every 6 months    Family History  Problem Relation Age of Onset   Arthritis Mother    Varicose Veins Mother    Lung cancer Father    COPD Father        Lung   Cancer Brother        Lukemia   COPD Brother    Cancer Brother        Prostate    Reviw of Systems:  Reviewed in the HPI.  All other systems are negative.  Physical Exam: Blood pressure 136/68, pulse 63, height 6\' 2"  (1.88 m), weight 237 lb 3.2 oz (107.6 kg), SpO2 97 %.       GEN:  Well nourished, well developed in no acute distress HEENT: Normal NECK: No JVD; No carotid bruits LYMPHATICS: No lymphadenopathy CARDIAC: RRR , no murmurs, rubs, gallops RESPIRATORY:  Clear to auscultation without rales, wheezing or rhonchi   ABDOMEN: Soft, non-tender, non-distended MUSCULOSKELETAL:  No edema; No deformity  SKIN: Warm and dry NEUROLOGIC:  Alert and oriented x 3    ECG:    Assessment / Plan:    1. CAD -       no angina   2.   Hyperlipidemia :    lipids look good.  Trigs are still elevated.  Work on carb reduction .  Cont weight loss efforts    3. HTN: BP looks better   3.   Carotid artery disease:   followed by [facility] / , MD  06/23/2022 11:04 AM    [facility] [address] [place],  [address] [place], [place]  [place] Pager [phone] Phone: [phone]; Fax: [phone]

## 2022-06-23 NOTE — Patient Instructions (Addendum)
Medication Instructions:  Your physician recommends that you continue on your current medications as directed. Please refer to the Current Medication list given to you today.  *If you need a refill on your cardiac medications before your next appointment, please call your pharmacy*  Lab Work: None  Testing/Procedures: None  Follow-Up: At [facility], you and your health needs are our priority.  As part of our continuing mission to provide you with exceptional heart care, we have created designated Provider Care Teams.  These Care Teams include your primary Cardiologist (physician) and Advanced Practice Providers (APPs -  Physician Assistants and Nurse Practitioners) who all work together to provide you with the care you need, when you need it.  Your next appointment:   6 month(s)  Provider:   Richardson Dopp, PA-C    Other Instructions Adopting a Healthy Lifestyle.   Weight: Know what a healthy weight is for you (roughly BMI <25) and aim to maintain this. You can calculate your body mass index on your smart phone  Diet: Aim for 7+ servings of fruits and vegetables daily Limit animal fats in diet for cholesterol and heart health - choose grass fed whenever available Avoid highly processed foods (fast food burgers, tacos, fried chicken, pizza, hot dogs, french fries)  Saturated fat comes in the form of butter, lard, coconut oil, margarine, partially hydrogenated oils, and fat in meat. These increase your risk of cardiovascular disease.  Use healthy plant oils, such as olive, canola, soy, corn, sunflower and peanut.  Whole foods such as fruits, vegetables and whole grains have fiber  Men need > 38 grams of fiber per day Women need > 25 grams of fiber per day  Load up on vegetables and fruits - one-half of your plate: Aim for color and variety, and remember that potatoes dont count. Go for whole grains - one-quarter of your plate: Whole wheat, barley, wheat berries, quinoa, oats,  brown rice, and foods made with them. If you want pasta, go with whole wheat pasta. Protein power - one-quarter of your plate: Fish, chicken, beans, and nuts are all healthy, versatile protein sources. Limit red meat. You need carbohydrates for energy! The type of carbohydrate is more important than the amount. Choose carbohydrates such as vegetables, fruits, whole grains, beans, and nuts in the place of white rice, white pasta, potatoes (baked or fried), macaroni and cheese, cakes, cookies, and donuts.  If youre thirsty, drink water. Coffee and tea are good in moderation, but skip sugary drinks and limit milk and dairy products to one or two daily servings. Keep sugar intake at 6 teaspoons or 24 grams or LESS       Exercise: Aim for 150 min of moderate intensity exercise weekly for heart health, and weights twice weekly for bone health Stay active - any steps are better than no steps! Aim for 7-9 hours of sleep daily

## 2022-07-28 ENCOUNTER — Other Ambulatory Visit: Payer: Self-pay

## 2022-07-28 MED ORDER — FUROSEMIDE 40 MG PO TABS: 20.0000 mg | ORAL_TABLET | Freq: Every day | ORAL | 3 refills | Status: DC

## 2022-07-28 MED ORDER — AMLODIPINE BESYLATE 5 MG PO TABS: 5.0000 mg | ORAL_TABLET | Freq: Every day | ORAL | 3 refills | Status: DC

## 2022-07-28 MED ORDER — PRAVASTATIN SODIUM 20 MG PO TABS: 20.0000 mg | ORAL_TABLET | Freq: Every evening | ORAL | 3 refills | Status: DC

## 2022-07-28 MED ORDER — CARVEDILOL 12.5 MG PO TABS: 12.5000 mg | ORAL_TABLET | Freq: Two times a day (BID) | ORAL | 3 refills | Status: DC

## 2022-08-04 ENCOUNTER — Other Ambulatory Visit: Payer: Self-pay | Admitting: Urology

## 2022-08-04 ENCOUNTER — Telehealth: Payer: Self-pay | Admitting: Cardiovascular Disease

## 2022-08-04 DIAGNOSIS — R972 Elevated prostate specific antigen [PSA]: Secondary | ICD-10-CM

## 2022-08-04 NOTE — Telephone Encounter (Signed)
Patient wants to update his insurance information.

## 2022-08-08 ENCOUNTER — Other Ambulatory Visit: Payer: Self-pay | Admitting: *Deleted

## 2022-08-08 MED ORDER — FUROSEMIDE 40 MG PO TABS: 20.0000 mg | ORAL_TABLET | Freq: Every day | ORAL | 0 refills | Status: DC

## 2022-08-09 ENCOUNTER — Telehealth: Payer: Self-pay | Admitting: Cardiovascular Disease

## 2022-08-09 NOTE — Telephone Encounter (Signed)
Pt c/o medication issue:  1. Name of Medication:   pravastatin (PRAVACHOL) 20 MG tablet    2. How are you currently taking this medication (dosage and times per day)? Take 1 tablet (20 mg total) by mouth every evening.   3. Are you having a reaction (difficulty breathing--STAT)? No  4. What is your medication issue? Pharmacy is calling to see if its okay to fill prescription for above medication due to pt being allergic to Rosuvastatin and since both medications are in the same class, pharmacy would like a callback and please refer to Ref#. Please advise.

## 2022-08-10 NOTE — Telephone Encounter (Signed)
Received a drug clarification request via Onbase. Filled out anf faxed back

## 2022-08-16 ENCOUNTER — Telehealth: Payer: Self-pay | Admitting: *Deleted

## 2022-08-16 NOTE — Telephone Encounter (Signed)
   Name: [name]  DOB: 1955/08/05  MRN: [id number]   Primary Cardiologist: Mertie Moores, MD  Chart reviewed as part of pre-operative protocol coverage. Derrick [name] was last seen on 06/23/2022 by Dr. Acie Fredrickson.  Per Dr. Acie Fredrickson "[place] is at low risk for his prostate biopsy."  Therefore, based on ACC/AHA guidelines, the patient would be at acceptable risk for the planned procedure without further cardiovascular testing.   Ideally aspirin should be continued without interruption, however if the bleeding risk is too great, aspirin may be held for 5-7 days prior to surgery. Please resume aspirin post operatively when it is felt to be safe from a bleeding standpoint.    I will route this recommendation to the requesting party via Epic fax function and remove from pre-op pool. Please call with questions.  Mayra Reel, NP 08/16/2022, 5:34 PM

## 2022-08-16 NOTE — Telephone Encounter (Signed)
   Pre-operative Risk Assessment    Patient Name: Derrick [name]  DOB: Oct 19, 1955 MRN: [id number]      Request for Surgical Clearance    Procedure:   PROSTATE Bx  Date of Surgery:  Clearance 09/22/22                                 Surgeon:  DR. Ellison Hughs Surgeon's Group or Practice Name:  [facility] Phone number:  [phone] EXT [phone] Fax number:  [phone]   Type of Clearance Requested:   - Medical ; ASA x 5 DAYS PRIOR   Type of Anesthesia:  Not Indicated   Additional requests/questions:    Jiles Prows   08/16/2022, 2:59 PM

## 2022-08-16 NOTE — Telephone Encounter (Signed)
Dr. Acie Fredrickson,  [place] saw this patient on 06/23/2022. Will you please comment on clearance for prostate biopsy?  Please route your response to P CV DIV Preop. I will communicate with requesting office once you have given recommendations.   Thank you!  Mayra Reel, NP

## 2022-08-28 ENCOUNTER — Ambulatory Visit
Admission: RE | Admit: 2022-08-28 | Discharge: 2022-08-28 | Disposition: A | Discharge status: Home / Self Care | Payer: Medicare Other | Source: Ambulatory Visit | Attending: Urology | Admitting: Urology

## 2022-08-28 DIAGNOSIS — R972 Elevated prostate specific antigen [PSA]: Secondary | ICD-10-CM

## 2022-08-28 MED ORDER — GADOPICLENOL 0.5 MMOL/ML IV SOLN
10.0000 mL | Freq: Once | INTRAVENOUS | Status: AC | PRN
  Administered 2022-08-28: 10 mL via INTRAVENOUS

## 2022-09-09 ENCOUNTER — Ambulatory Visit: Payer: Medicare Other | Attending: Physician Assistant

## 2022-09-09 DIAGNOSIS — I10 Essential (primary) hypertension: Secondary | ICD-10-CM

## 2022-09-09 DIAGNOSIS — R0602 Shortness of breath: Secondary | ICD-10-CM

## 2022-09-09 DIAGNOSIS — R600 Localized edema: Secondary | ICD-10-CM

## 2022-09-09 DIAGNOSIS — I2510 Atherosclerotic heart disease of native coronary artery without angina pectoris: Secondary | ICD-10-CM

## 2022-09-09 DIAGNOSIS — I739 Peripheral vascular disease, unspecified: Secondary | ICD-10-CM

## 2022-09-10 LAB — LIPID PANEL
Chol/HDL Ratio: 3.6 ratio (ref 0.0–5.0)
Cholesterol, Total: 122 mg/dL (ref 100–199)
HDL: 34 mg/dL — ABNORMAL LOW (ref >39)
LDL Chol Calc (NIH): 52 mg/dL (ref 0–99)
Triglycerides: 220 mg/dL — ABNORMAL HIGH (ref 0–149)
VLDL Cholesterol Cal: 36 mg/dL (ref 5–40)

## 2022-09-10 LAB — COMPREHENSIVE METABOLIC PANEL
ALT: 17 IU/L (ref 0–44)
AST: 18 IU/L (ref 0–40)
Albumin/Globulin Ratio: 1.9 (ref 1.2–2.2)
Albumin: 4.5 g/dL (ref 3.9–4.9)
Alkaline Phosphatase: 54 IU/L (ref 44–121)
BUN/Creatinine Ratio: 20 (ref 10–24)
BUN: 25 mg/dL (ref 8–27)
Bilirubin Total: 0.4 mg/dL (ref 0.0–1.2)
CO2: 25 mmol/L (ref 20–29)
Calcium: 9.8 mg/dL (ref 8.6–10.2)
Chloride: 101 mmol/L (ref 96–106)
Creatinine, Ser: 1.27 mg/dL (ref 0.76–1.27)
Globulin, Total: 2.4 g/dL (ref 1.5–4.5)
Glucose: 104 mg/dL — ABNORMAL HIGH (ref 70–99)
Potassium: 4.5 mmol/L (ref 3.5–5.2)
Sodium: 141 mmol/L (ref 134–144)
Total Protein: 6.9 g/dL (ref 6.0–8.5)
eGFR: 61 mL/min/{1.73_m2} (ref >59)

## 2022-09-12 ENCOUNTER — Telehealth: Payer: Self-pay | Admitting: *Deleted

## 2022-09-12 DIAGNOSIS — Z79899 Other long term (current) drug therapy: Secondary | ICD-10-CM

## 2022-09-12 MED ORDER — OMEGA-3 FATTY ACIDS 1000 MG PO CAPS: 1065.0000 mg | ORAL_CAPSULE | Freq: Two times a day (BID) | ORAL | 0 refills | Status: DC

## 2022-09-12 NOTE — Telephone Encounter (Signed)
-----   Message from Beatrice Lecher, [place] sent at 09/12/2022  1:20 PM EDT ----- Results sent to [name] via MyChart. See MyChart comment. PLAN: -Increase Fish Oil to 1,605 mg twice daily  -Lipids, LFTs in 3 mos  Mr. Izer  Your LDL cholesterol is optimal. Your triglycerides have increased since stopping the fenofibrate. Your creatinine (kidney function) has improved and is now normal. Your potassium, liver enzymes (AST, ALT) are normal. Let's increase your fish oil (omega-3 fatty acids) to twice daily to see if we can get your triglycerides to 150 or less. I will set up some repeat labs in 3 mos.  Tereso Newcomer, PA-C    09/12/2022 1:16 PM

## 2022-10-11 ENCOUNTER — Other Ambulatory Visit: Payer: Self-pay | Admitting: *Deleted

## 2022-10-11 DIAGNOSIS — I6523 Occlusion and stenosis of bilateral carotid arteries: Secondary | ICD-10-CM

## 2022-10-11 DIAGNOSIS — M7989 Other specified soft tissue disorders: Secondary | ICD-10-CM

## 2022-10-11 DIAGNOSIS — I779 Disorder of arteries and arterioles, unspecified: Secondary | ICD-10-CM

## 2022-10-20 ENCOUNTER — Ambulatory Visit (INDEPENDENT_AMBULATORY_CARE_PROVIDER_SITE_OTHER)
Admission: RE | Admit: 2022-10-20 | Discharge: 2022-10-20 | Disposition: A | Discharge status: Home / Self Care | Payer: Medicare Other | Source: Ambulatory Visit | Attending: Vascular Surgery | Admitting: Vascular Surgery

## 2022-10-20 ENCOUNTER — Ambulatory Visit (INDEPENDENT_AMBULATORY_CARE_PROVIDER_SITE_OTHER): Payer: Medicare Other | Admitting: Physician Assistant

## 2022-10-20 ENCOUNTER — Ambulatory Visit (HOSPITAL_COMMUNITY)
Admission: RE | Admit: 2022-10-20 | Discharge: 2022-10-20 | Disposition: A | Discharge status: Home / Self Care | Payer: Medicare Other | Source: Ambulatory Visit | Attending: Vascular Surgery | Admitting: Vascular Surgery

## 2022-10-20 VITALS — BP 158/76 | HR 54 | Temp 98.1°F | Resp 18 | Ht 74.0 in | Wt 233.4 lb

## 2022-10-20 DIAGNOSIS — I779 Disorder of arteries and arterioles, unspecified: Secondary | ICD-10-CM

## 2022-10-20 DIAGNOSIS — M7989 Other specified soft tissue disorders: Secondary | ICD-10-CM

## 2022-10-20 LAB — VAS US ABI WITH/WO TBI
Left ABI: 1.03
Right ABI: 1.05

## 2022-10-20 NOTE — Progress Notes (Signed)
[facility] [place] HISTORY AND PHYSICAL   History of Present Illness:  Patient is a 67 y.o. year old male who presents for evaluation of lower extremity PAD He has a Surgical history significant for bilateral iliac stenting as well as Right common femoral to below the knee popliteal bypass as well as left common femoral to PT bypass by Dr. Darrick Penna in 2016.  He denies any claudication, rest pain, or nonhealing wounds of bilateral lower extremities.  He is on aspirin and statin daily.  He quit smoking in 1992.   He states he has developed new worsening right hip pain.  He is able to do yard work and ADL's, but this pain is stopping him from resting, walking up hills or stirs at times.  He denies non healing wounds and frank claudication.    He has history of lumbar fusion anterior/posterior by Dr. Shon Baton. 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  That is post ALIF L5-S1 and OLIF L4-5   He states his pain on the right is not the same pain he had before his spinal fusions.   He is medically managed on Statin daily.  Past Medical History:  Diagnosis Date   Chronic kidney disease    "mild"-from taking meloxicam for many years.   Complication of anesthesia    "I was slow to wake up"   Coronary artery disease 2006   stents x2=LAD LCX; totally occluded RCA b. CABG 03/02/17 x2 (left internal mammary artery to LAD, Y-graft right mammary from left mammary to OM).// Myoview 9/22: Inferior infarct, no ischemia, EF 61; low risk // Echocardiogram 9/22: EF 55-60, no RWMA, GLS -24.5%, normal RVSF, RVSP 30.7, mild LAE, trivial MR, trivial AI, mild AV sclerosis w/o AS   Dyslipidemia    GERD (gastroesophageal reflux disease)    History of bronchitis    Hyperlipidemia    statin intolerant   Hyperparathyroidism    Hypertension    Insomnia    Metatarsalgia    Neuropathy    OA (osteoarthritis)    Peripheral vascular disease (HCC)    Seasonal allergies     Urinary frequency    Wears glasses     Past Surgical History:  Procedure Laterality Date   ABDOMINAL EXPOSURE N/A 03/31/2021   Procedure: ABDOMINAL EXPOSURE;  Surgeon: Cephus Shelling, MD;  Location: [facility] OR;  Service: Vascular;  Laterality: N/A;   ANTERIOR LUMBAR FUSION N/A 03/31/2021   Procedure: ANTERIOR LUMBAR FUSION 1 LEVEL LUMBAR FIVE TO SACRAL ONE;  Surgeon: Venita Lick, MD;  Location: [facility] OR;  Service: Orthopedics;  Laterality: N/A;  3 C-BED  LEFT TAP BLOCK WITH EXPAREL   CARDIAC CATHETERIZATION  10/28/2004   circ and mid LAD chyper stents   CERVICAL FUSION  2007,2009   CERVICAL SPINE SURGERY  2004   COLONOSCOPY     CORONARY ANGIOPLASTY  2006   stents placed    CORONARY ARTERY BYPASS GRAFT N/A 03/02/2017   Procedure: CORONARY ARTERY BYPASS GRAFTING (CABG), ON PUMP, TIMES TWO, USING BILATERAL MAMMARY ARTERIES;  Surgeon: Loreli Slot, MD;  Location: [facility] OR;  Service: Open Heart Surgery;  Laterality: N/A;   ELBOW ARTHROPLASTY  1994   right   ENDARTERECTOMY FEMORAL Left 03/17/2015   Procedure: ENDARTERECTOMY COMMON FEMORAL WITH PROFUNDOPLASTY;  Surgeon: Sherren Kerns, MD;  Location: [facility] OR;  Service: Vascular;  Laterality: Left;   FEMORAL-POPLITEAL BYPASS GRAFT Left 03/17/2015   Procedure: BYPASS GRAFT FEMORAL-POPLITEAL ARTERY-LEFT LEG  AND POSTERIOR TIBIAL SEQUENTIAL GRAFT TO BELOW KNEE POPLITEAL ARTERY;  Surgeon: Sherren Kerns, MD;  Location: [facility] OR;  Service: Vascular;  Laterality: Left;   FEMORAL-POPLITEAL BYPASS GRAFT Right 05/11/2015   Procedure:  RIGHT FEMORAL-BELOW THE KNEE POPLITEAL ARTERY BYPASS GRAFT USING NON REVERSE RIGHT GREATER SAPHENOUS VEIN;  Surgeon: Sherren Kerns, MD;  Location: [facility] OR;  Service: Vascular;  Laterality: Right;   INCISION AND DRAINAGE ABSCESS POSTERIOR CERVICALSPINE  2009   INTRAOPERATIVE ARTERIOGRAM Left 03/17/2015   Procedure: INTRA OPERATIVE ARTERIOGRAM X2;  Surgeon: Sherren Kerns, MD;  Location: [facility] OR;  Service: Vascular;   Laterality: Left;   INTRAOPERATIVE ARTERIOGRAM Right 05/11/2015   Procedure: INTRA OPERATIVE ARTERIOGRAM;  Surgeon: Sherren Kerns, MD;  Location: [facility] OR;  Service: Vascular;  Laterality: Right;   LEFT HEART CATH AND CORONARY ANGIOGRAPHY N/A 03/01/2017   Procedure: LEFT HEART CATH AND CORONARY ANGIOGRAPHY;  Surgeon: Kathleene Hazel, MD;  Location: [facility] INVASIVE CV LAB;  Service: Cardiovascular;  Laterality: N/A;   PATCH ANGIOPLASTY Left 03/17/2015   Procedure: VEIN PATCH ANGIOPLASTY COMMON FEMORAL ARTERY;  Surgeon: Sherren Kerns, MD;  Location: [facility] OR;  Service: Vascular;  Laterality: Left;   PERIPHERAL VASCULAR CATHETERIZATION N/A 01/16/2015   Procedure: Abdominal Aortogram;  Surgeon: Sherren Kerns, MD;  Location: [facility] INVASIVE CV LAB;  Service: Cardiovascular;  Laterality: N/A;   SHOULDER OPEN ROTATOR CUFF REPAIR Right 04/23/2013   Procedure: RIGHT TWO TENDON ROTATOR CUFF REPAIR  SUBACROMIAL DECOMPRESSION AND OPEN DISTAL CLAVICLE RESECTION;  Surgeon: Wyn Forster., MD;  Location: [facility];  Service: Orthopedics;  Laterality: Right;   stents placed   2006   TEE WITHOUT CARDIOVERSION N/A 03/02/2017   Procedure: TRANSESOPHAGEAL ECHOCARDIOGRAM (TEE);  Surgeon: Loreli Slot, MD;  Location: [facility] OR;  Service: Open Heart Surgery;  Laterality: N/A;   VEIN HARVEST Right 05/11/2015   Procedure: WITH NON REVERSE RIGHT GREATER SAPHENOUS VEIN HARVEST;  Surgeon: Sherren Kerns, MD;  Location: [facility] OR;  Service: Vascular;  Laterality: Right;    ROS:   General:  No weight loss, Fever, chills  HEENT: No recent headaches, no nasal bleeding, no visual changes, no sore throat  Neurologic: No dizziness, blackouts, seizures. No recent symptoms of stroke or mini- stroke. No recent episodes of slurred speech, or temporary blindness.  Cardiac: No recent episodes of chest pain/pressure, no shortness of breath at rest.  No shortness of breath with exertion.  Denies history of  atrial fibrillation or irregular heartbeat  Vascular: No history of rest pain in feet.  No history of claudication.  No history of non-healing ulcer, No history of DVT   Pulmonary: No home oxygen, no productive cough, no hemoptysis,  No asthma or wheezing  Musculoskeletal:  [ x] Arthritis, [ x] Low back pain,  [ ]  Joint pain  Hematologic:No history of hypercoagulable state.  No history of easy bleeding.  No history of anemia  Gastrointestinal: No hematochezia or melena,  No gastroesophageal reflux, no trouble swallowing  Urinary: [ ]  chronic Kidney disease, [ ]  on HD - [ ]  MWF or [ ]  TTHS, [ ]  Burning with urination, [ ]  Frequent urination, [ ]  Difficulty urinating;   Skin: No rashes  Psychological: No history of anxiety,  No history of depression  Social History Social History   Tobacco Use   Smoking status: Former    Types: Cigarettes    Quit date: 05/30/1989    Years since quitting: 33.4  Passive exposure: Never   Smokeless tobacco: Never  Vaping Use   Vaping Use: Never used  Substance Use Topics   Alcohol use: Yes    Comment: 1 beer every 6 months   Drug use: Never    Family History Family History  Problem Relation Age of Onset   Arthritis Mother    Varicose Veins Mother    Lung cancer Father    COPD Father        Lung   Cancer Brother        Lukemia   COPD Brother    Cancer Brother        Prostate    Allergies  Allergies  Allergen Reactions   Codeine Itching and Other (See Comments)   Atorvastatin Other (See Comments)    Muscle aches Muscle aches   Crestor [Rosuvastatin Calcium]     Muscle aches   Meloxicam Other (See Comments)    Can not take due to kidneys   Statins Other (See Comments)    myalgia     Current Outpatient Medications  Medication Sig Dispense Refill   acetaminophen (TYLENOL) 650 MG CR tablet Take 1,300 mg by mouth in the morning and at bedtime.     albuterol (VENTOLIN HFA) 108 (90 Base) MCG/ACT inhaler Inhale 2 puffs into the  lungs every 4 (four) hours as needed for wheezing or shortness of breath.     amLODipine (NORVASC) 5 MG tablet Take 1 tablet (5 mg total) by mouth daily. 90 tablet 3   aspirin 81 MG EC tablet 81 mg daily.     calcitRIOL (ROCALTROL) 0.5 MCG capsule Take 0.5 mcg by mouth daily.     Calcium Carbonate-Vit D-Min (CALCIUM 1200 PO) Take by mouth daily.     carvedilol (COREG) 12.5 MG tablet Take 1 tablet (12.5 mg total) by mouth 2 (two) times daily. 180 tablet 3   enoxaparin (LOVENOX) 40 MG/0.4ML injection Inject 0.4 mLs (40 mg total) into the skin daily for 10 days. 10 day supply 1 injection per day 4 mL 0   ezetimibe (ZETIA) 10 MG tablet Take 10 mg by mouth every evening.     famotidine (PEPCID) 20 MG tablet Take 20 mg by mouth 2 (two) times daily.     fish oil-omega-3 fatty acids 1000 MG capsule Take 1 capsule (1,000 mg total) by mouth in the morning and at bedtime. 60 capsule 0   furosemide (LASIX) 40 MG tablet Take 0.5 tablets (20 mg total) by mouth daily. 5 tablet 0   gabapentin (NEURONTIN) 300 MG capsule Take 300 mg by mouth at bedtime.     ipratropium (ATROVENT HFA) 17 MCG/ACT inhaler Inhale 2 puffs into the lungs every 6 (six) hours as needed.     ipratropium (ATROVENT) 0.03 % nasal spray Place 1 spray into both nostrils 2 (two) times daily as needed for rhinitis.     losartan (COZAAR) 100 MG tablet Take 100 mg by mouth every evening.      Magnesium 250 MG TABS Take 1 tablet by mouth daily in the afternoon.     nitroGLYCERIN (NITROSTAT) 0.4 MG SL tablet Place 1 tablet (0.4 mg total) under the tongue every 5 (five) minutes as needed for chest pain. 25 tablet 3   potassium chloride (KLOR-CON) 10 MEQ tablet TAKE 1 TABLET EVERY DAY 90 tablet 2   pravastatin (PRAVACHOL) 20 MG tablet Take 1 tablet (20 mg total) by mouth every evening. 90 tablet 3   Propylene Glycol (SYSTANE BALANCE) 0.6 %  SOLN Place 1 drop into both eyes daily as needed (dry eyes).     tamsulosin (FLOMAX) 0.4 MG CAPS capsule Take 0.4  mg by mouth 2 (two) times daily.     TART CHERRY PO Take 3,000 mg by mouth daily in the afternoon.     traZODone (DESYREL) 50 MG tablet Take 50 mg by mouth at bedtime.     vitamin B-12 (CYANOCOBALAMIN) 500 MCG tablet Take 500 mcg by mouth daily.     Current Facility-Administered Medications  Medication Dose Route Frequency Provider Last Rate Last Admin   inclisiran (LEQVIO) injection 284 mg  284 mg Subcutaneous Once Nahser, Deloris Ping, MD        Physical Examination  Vitals:   10/20/22 0916  BP: (!) 158/76  Pulse: (!) 54  Resp: 18  Temp: 98.1 F (36.7 C)  TempSrc: Temporal  SpO2: 97%  Weight: 233 lb 6.4 oz (105.9 kg)  Height: 6\' 2"  (1.88 m)    Body mass index is 29.97 kg/m.  General:  Alert and oriented, no acute distress HEENT: Normal Neck: No bruit or JVD Pulmonary: Clear to auscultation bilaterally Cardiac: Regular Rate and Rhythm without murmur Abdomen: Soft, non-tender, non-distended, no mass, no scars Skin: No rash Extremity Pulses:  radial,  femoral, right dorsalis pedis,  pulses bilaterally Musculoskeletal: No deformity or edema  Neurologic: Upper and lower extremity motor 5/5 and symmetric  DATA:     ABI Findings:  +---------+------------------+-----+---------+--------+  Right   Rt Pressure (mmHg)IndexWaveform Comment   +---------+------------------+-----+---------+--------+  Brachial 134                                       +---------+------------------+-----+---------+--------+  PTA     150               1.05 triphasic          +---------+------------------+-----+---------+--------+  DP      150               1.05 triphasic          +---------+------------------+-----+---------+--------+  Great Toe98                0.69                    +---------+------------------+-----+---------+--------+   +---------+------------------+-----+----------+-------+  Left    Lt Pressure (mmHg)IndexWaveform  Comment   +---------+------------------+-----+----------+-------+  Brachial 143                                       +---------+------------------+-----+----------+-------+  PTA     134               0.94 monophasic         +---------+------------------+-----+----------+-------+  DP      148               1.03 triphasic          +---------+------------------+-----+----------+-------+  Great Toe109               0.76                    +---------+------------------+-----+----------+-------+   +-------+-----------+-----------+------------+------------+  ABI/TBIToday's ABIToday's TBIPrevious ABIPrevious TBI  +-------+-----------+-----------+------------+------------+  Right 1.05       0.69       1.14  0.74          +-------+-----------+-----------+------------+------------+  Left  1.03       0.76       1.29        0.71          +-------+-----------+-----------+------------+------------+    Previous ABI on 10/14/21.    Summary:  Right: Resting right ankle-brachial index is within normal range. The  right toe-brachial index is abnormal.   Left: Resting left ankle-brachial index is within normal range. The left  toe-brachial index is normal.   Right Stent(s):  +---------------+--------+---------------+----------+--------+  CIA           PSV cm/sStenosis       Waveform  Comments  +---------------+--------+---------------+----------+--------+  Prox to Stent  70                                         +---------------+--------+---------------+----------+--------+  Proximal Stent 188                    biphasic            +---------------+--------+---------------+----------+--------+  Mid Stent      232     50-99% stenosisbiphasic            +---------------+--------+---------------+----------+--------+  Distal Stent   243     50-99% stenosisbiphasic             +---------------+--------+---------------+----------+--------+  Distal to Stent232     50-99% stenosismonophasic          +---------------+--------+---------------+----------+--------+     Left Stent(s):  +---------------+--------+---------------+----------+--------+  CIA           PSV cm/sStenosis       Waveform  Comments  +---------------+--------+---------------+----------+--------+  Prox to Stent  70                                         +---------------+--------+---------------+----------+--------+  Proximal Stent 235     50-99% stenosisbiphasic            +---------------+--------+---------------+----------+--------+  Mid Stent      186                    biphasic            +---------------+--------+---------------+----------+--------+  Distal Stent   252     50-99% stenosisbiphasic            +---------------+--------+---------------+----------+--------+  Distal to [id number]                    monophasic          +---------------+--------+---------------+----------+--------+     Summary:  Stenosis:  Patent bilateral CIA stents.  Right stent with velocities in ghe 50-99% range.  Left stent with velocities in the 50-99% range.    ASSESSMENT/PLAN:   67 y/o male here for follow up PAD surveillance s/p bilateral iliac stenting as well as Right common femoral to below the knee popliteal bypass as well as left common femoral to PT bypass by Dr. Darrick Penna in 2016. He has pain with hills and stairs on the right iliac crest/hip.  The pain is getting more pronounced.  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  The ABI's show a decrease  in the TBI on the right with normal ABI's B LE.    The stents were placed in 2016 and with the new symptoms of worsening right hip pain, sometime at rest we will schedule him for aortogram with femoral artery approach verse radial artery  approach to get a good viw of the common iliac stent sand possible intervention.    I discussed and reviewed the studies with DR. Brabham in clinic today and agrees to do the angiogram.  He does have CKD 09/09/22 Cr was 1.27.  I have also suggested he f/u with Dr. Shon Baton for lumbar exam to r/o spinal causes of right hip pain as well.  It appears to be musculoskeletal as well.    History of carotid ICA surveillance.  He will have a carotid duplex in 1 year.       Mosetta Pigeon PA-C [facility] [place] Office: [phone]  MD in clinic [place]

## 2022-10-20 NOTE — H&P (View-Only) (Signed)
[facility] [place] HISTORY AND PHYSICAL   History of Present Illness:  Patient is a 67 y.o. year old male who presents for evaluation of lower extremity PAD He has a Surgical history significant for bilateral iliac stenting as well as Right common femoral to below the knee popliteal bypass as well as left common femoral to PT bypass by Dr. Fields in 2016.  He denies any claudication, rest pain, or nonhealing wounds of bilateral lower extremities.  He is on aspirin and statin daily.  He quit smoking in 1992.   He states he has developed new worsening right hip pain.  He is able to do yard work and ADL's, but this pain is stopping him from resting, walking up hills or stirs at times.  He denies non healing wounds and frank claudication.    He has history of lumbar fusion anterior/posterior by Dr. Brooks. 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  That is post ALIF L5-S1 and OLIF L4-5   He states his pain on the right is not the same pain he had before his spinal fusions.   He is medically managed on Statin daily.  Past Medical History:  Diagnosis Date   Chronic kidney disease    "mild"-from taking meloxicam for many years.   Complication of anesthesia    "I was slow to wake up"   Coronary artery disease 2006   stents x2=LAD LCX; totally occluded RCA b. CABG 03/02/17 x2 (left internal mammary artery to LAD, Y-graft right mammary from left mammary to OM).// Myoview 9/22: Inferior infarct, no ischemia, EF 61; low risk // Echocardiogram 9/22: EF 55-60, no RWMA, GLS -24.5%, normal RVSF, RVSP 30.7, mild LAE, trivial MR, trivial AI, mild AV sclerosis w/o AS   Dyslipidemia    GERD (gastroesophageal reflux disease)    History of bronchitis    Hyperlipidemia    statin intolerant   Hyperparathyroidism    Hypertension    Insomnia    Metatarsalgia    Neuropathy    OA (osteoarthritis)    Peripheral vascular disease (HCC)    Seasonal allergies     Urinary frequency    Wears glasses     Past Surgical History:  Procedure Laterality Date   ABDOMINAL EXPOSURE N/A 03/31/2021   Procedure: ABDOMINAL EXPOSURE;  Surgeon: Clark, Christopher J, MD;  Location: [facility];  Service: Vascular;  Laterality: N/A;   ANTERIOR LUMBAR FUSION N/A 03/31/2021   Procedure: ANTERIOR LUMBAR FUSION 1 LEVEL LUMBAR FIVE TO SACRAL ONE;  Surgeon: Brooks, Dahari, MD;  Location: [facility];  Service: Orthopedics;  Laterality: N/A;  3 C-BED  LEFT TAP BLOCK WITH EXPAREL   CARDIAC CATHETERIZATION  10/28/2004   circ and mid LAD chyper stents   CERVICAL FUSION  2007,2009   CERVICAL SPINE SURGERY  2004   COLONOSCOPY     CORONARY ANGIOPLASTY  2006   stents placed    CORONARY ARTERY BYPASS GRAFT N/A 03/02/2017   Procedure: CORONARY ARTERY BYPASS GRAFTING (CABG), ON PUMP, TIMES TWO, USING BILATERAL MAMMARY ARTERIES;  Surgeon: Hendrickson, Chinedum C, MD;  Location: [facility];  Service: Open Heart Surgery;  Laterality: N/A;   ELBOW ARTHROPLASTY  1994   right   ENDARTERECTOMY FEMORAL Left 03/17/2015   Procedure: ENDARTERECTOMY COMMON FEMORAL WITH PROFUNDOPLASTY;  Surgeon: Charles E Fields, MD;  [facility]: [facility];  Service: Vascular;  Laterality: Left;   FEMORAL-POPLITEAL BYPASS GRAFT Left 03/17/2015   Procedure: BYPASS GRAFT FEMORAL-POPLITEAL ARTERY-LEFT LEG   AND POSTERIOR TIBIAL SEQUENTIAL GRAFT TO BELOW KNEE POPLITEAL ARTERY;  Surgeon: Charles E Fields, MD;  [facility];  Service: Vascular;  Laterality: Left;   FEMORAL-POPLITEAL BYPASS GRAFT Right 05/11/2015   Procedure:  RIGHT FEMORAL-BELOW THE KNEE POPLITEAL ARTERY BYPASS GRAFT USING NON REVERSE RIGHT GREATER SAPHENOUS VEIN;  Surgeon: Charles E Fields, MD;  [facility];  Service: Vascular;  Laterality: Right;   INCISION AND DRAINAGE ABSCESS POSTERIOR CERVICALSPINE  2009   INTRAOPERATIVE ARTERIOGRAM Left 03/17/2015   Procedure: INTRA OPERATIVE ARTERIOGRAM X2;  Surgeon: Charles E Fields, MD;  [facility];  Service: Vascular;   Laterality: Left;   INTRAOPERATIVE ARTERIOGRAM Right 05/11/2015   Procedure: INTRA OPERATIVE ARTERIOGRAM;  Surgeon: Charles E Fields, MD;  [facility];  Service: Vascular;  Laterality: Right;   LEFT HEART CATH AND CORONARY ANGIOGRAPHY N/A 03/01/2017   Procedure: LEFT HEART CATH AND CORONARY ANGIOGRAPHY;  Surgeon: McAlhany, Christopher D, MD;  [facility];  Service: Cardiovascular;  Laterality: N/A;   PATCH ANGIOPLASTY Left 03/17/2015   Procedure: VEIN PATCH ANGIOPLASTY COMMON FEMORAL ARTERY;  Surgeon: Charles E Fields, MD;  [facility];  Service: Vascular;  Laterality: Left;   PERIPHERAL VASCULAR CATHETERIZATION N/A 01/16/2015   Procedure: Abdominal Aortogram;  Surgeon: Charles E Fields, MD;  [facility];  Service: Cardiovascular;  Laterality: N/A;   SHOULDER OPEN ROTATOR CUFF REPAIR Right 04/23/2013   Procedure: RIGHT TWO TENDON ROTATOR CUFF REPAIR  SUBACROMIAL DECOMPRESSION AND OPEN DISTAL CLAVICLE RESECTION;  Surgeon: Robert V Sypher Jr., MD;  Location: [facility];  Service: Orthopedics;  Laterality: Right;   stents placed   2006   TEE WITHOUT CARDIOVERSION N/A 03/02/2017   Procedure: TRANSESOPHAGEAL ECHOCARDIOGRAM (TEE);  Surgeon: Hendrickson, Blake C, MD;  [facility];  Service: Open Heart Surgery;  Laterality: N/A;   VEIN HARVEST Right 05/11/2015   Procedure: WITH NON REVERSE RIGHT GREATER SAPHENOUS VEIN HARVEST;  Surgeon: Charles E Fields, MD;  [facility];  Service: Vascular;  Laterality: Right;    ROS:   General:  No weight loss, Fever, chills  HEENT: No recent headaches, no nasal bleeding, no visual changes, no sore throat  Neurologic: No dizziness, blackouts, seizures. No recent symptoms of stroke or mini- stroke. No recent episodes of slurred speech, or temporary blindness.  Cardiac: No recent episodes of chest pain/pressure, no shortness of breath at rest.  No shortness of breath with exertion.  Denies history of  atrial fibrillation or irregular heartbeat  Vascular: No history of rest pain in feet.  No history of claudication.  No history of non-healing ulcer, No history of DVT   Pulmonary: No home oxygen, no productive cough, no hemoptysis,  No asthma or wheezing  Musculoskeletal:  [ x] Arthritis, [ x] Low back pain,  [ ] Joint pain  Hematologic:No history of hypercoagulable state.  No history of easy bleeding.  No history of anemia  Gastrointestinal: No hematochezia or melena,  No gastroesophageal reflux, no trouble swallowing  Urinary: [ ] chronic Kidney disease, [ ] on HD - [ ] MWF or [ ] TTHS, [ ] Burning with urination, [ ] Frequent urination, [ ] Difficulty urinating;   Skin: No rashes  Psychological: No history of anxiety,  No history of depression  Social History Social History   Tobacco Use   Smoking status: Former    Types: Cigarettes    Quit date: 05/30/1989    Years since quitting: 33.4      Passive exposure: Never   Smokeless tobacco: Never  Vaping Use   Vaping Use: Never used  Substance Use Topics   Alcohol use: Yes    Comment: 1 beer every 6 months   Drug use: Never    Family History Family History  Problem Relation Age of Onset   Arthritis Mother    Varicose Veins Mother    Lung cancer Father    COPD Father        Lung   Cancer Brother        Lukemia   COPD Brother    Cancer Brother        Prostate    Allergies  Allergies  Allergen Reactions   Codeine Itching and Other (See Comments)   Atorvastatin Other (See Comments)    Muscle aches Muscle aches   Crestor [Rosuvastatin Calcium]     Muscle aches   Meloxicam Other (See Comments)    Can not take due to kidneys   Statins Other (See Comments)    myalgia     Current Outpatient Medications  Medication Sig Dispense Refill   acetaminophen (TYLENOL) 650 MG CR tablet Take 1,300 mg by mouth in the morning and at bedtime.     albuterol (VENTOLIN HFA) 108 (90 Base) MCG/ACT inhaler Inhale 2 puffs into the  lungs every 4 (four) hours as needed for wheezing or shortness of breath.     amLODipine (NORVASC) 5 MG tablet Take 1 tablet (5 mg total) by mouth daily. 90 tablet 3   aspirin 81 MG EC tablet 81 mg daily.     calcitRIOL (ROCALTROL) 0.5 MCG capsule Take 0.5 mcg by mouth daily.     Calcium Carbonate-Vit D-Min (CALCIUM 1200 PO) Take by mouth daily.     carvedilol (COREG) 12.5 MG tablet Take 1 tablet (12.5 mg total) by mouth 2 (two) times daily. 180 tablet 3   enoxaparin (LOVENOX) 40 MG/0.4ML injection Inject 0.4 mLs (40 mg total) into the skin daily for 10 days. 10 day supply 1 injection per day 4 mL 0   ezetimibe (ZETIA) 10 MG tablet Take 10 mg by mouth every evening.     famotidine (PEPCID) 20 MG tablet Take 20 mg by mouth 2 (two) times daily.     fish oil-omega-3 fatty acids 1000 MG capsule Take 1 capsule (1,000 mg total) by mouth in the morning and at bedtime. 60 capsule 0   furosemide (LASIX) 40 MG tablet Take 0.5 tablets (20 mg total) by mouth daily. 5 tablet 0   gabapentin (NEURONTIN) 300 MG capsule Take 300 mg by mouth at bedtime.     ipratropium (ATROVENT HFA) 17 MCG/ACT inhaler Inhale 2 puffs into the lungs every 6 (six) hours as needed.     ipratropium (ATROVENT) 0.03 % nasal spray Place 1 spray into both nostrils 2 (two) times daily as needed for rhinitis.     losartan (COZAAR) 100 MG tablet Take 100 mg by mouth every evening.      Magnesium 250 MG TABS Take 1 tablet by mouth daily in the afternoon.     nitroGLYCERIN (NITROSTAT) 0.4 MG SL tablet Place 1 tablet (0.4 mg total) under the tongue every 5 (five) minutes as needed for chest pain. 25 tablet 3   potassium chloride (KLOR-CON) 10 MEQ tablet TAKE 1 TABLET EVERY DAY 90 tablet 2   pravastatin (PRAVACHOL) 20 MG tablet Take 1 tablet (20 mg total) by mouth every evening. 90 tablet 3   Propylene Glycol (SYSTANE BALANCE) 0.6 %   SOLN Place 1 drop into both eyes daily as needed (dry eyes).     tamsulosin (FLOMAX) 0.4 MG CAPS capsule Take 0.4  mg by mouth 2 (two) times daily.     TART CHERRY PO Take 3,000 mg by mouth daily in the afternoon.     traZODone (DESYREL) 50 MG tablet Take 50 mg by mouth at bedtime.     vitamin B-12 (CYANOCOBALAMIN) 500 MCG tablet Take 500 mcg by mouth daily.     Current Facility-Administered Medications  Medication Dose Route Frequency Provider Last Rate Last Admin   inclisiran (LEQVIO) injection 284 mg  284 mg Subcutaneous Once Nahser, Philip J, MD        Physical Examination  Vitals:   10/20/22 0916  BP: (!) 158/76  Pulse: (!) 54  Resp: 18  Temp: 98.1 F (36.7 C)  TempSrc: Temporal  SpO2: 97%  Weight: 233 lb 6.4 oz (105.9 kg)  Height: 6' 2" (1.88 m)    Body mass index is 29.97 kg/m.  General:  Alert and oriented, no acute distress HEENT: Normal Neck: No bruit or JVD Pulmonary: Clear to auscultation bilaterally Cardiac: Regular Rate and Rhythm without murmur Abdomen: Soft, non-tender, non-distended, no mass, no scars Skin: No rash Extremity Pulses:  radial,  femoral, right dorsalis pedis,  pulses bilaterally Musculoskeletal: No deformity or edema  Neurologic: Upper and lower extremity motor 5/5 and symmetric  DATA:     ABI Findings:  +---------+------------------+-----+---------+--------+  Right   Rt Pressure (mmHg)IndexWaveform Comment   +---------+------------------+-----+---------+--------+  Brachial 134                                       +---------+------------------+-----+---------+--------+  PTA     150               1.05 triphasic          +---------+------------------+-----+---------+--------+  DP      150               1.05 triphasic          +---------+------------------+-----+---------+--------+  Great Toe98                0.69                    +---------+------------------+-----+---------+--------+   +---------+------------------+-----+----------+-------+  Left    Lt Pressure (mmHg)IndexWaveform  Comment   +---------+------------------+-----+----------+-------+  Brachial 143                                       +---------+------------------+-----+----------+-------+  PTA     134               0.94 monophasic         +---------+------------------+-----+----------+-------+  DP      148               1.03 triphasic          +---------+------------------+-----+----------+-------+  Great Toe109               0.76                    +---------+------------------+-----+----------+-------+   +-------+-----------+-----------+------------+------------+  ABI/TBIToday's ABIToday's TBIPrevious ABIPrevious TBI  +-------+-----------+-----------+------------+------------+  Right 1.05       0.69       1.14          0.74          +-------+-----------+-----------+------------+------------+  Left  1.03       0.76       1.29        0.71          +-------+-----------+-----------+------------+------------+    Previous ABI on 10/14/21.    Summary:  Right: Resting right ankle-brachial index is within normal range. The  right toe-brachial index is abnormal.   Left: Resting left ankle-brachial index is within normal range. The left  toe-brachial index is normal.   Right Stent(s):  +---------------+--------+---------------+----------+--------+  CIA           PSV cm/sStenosis       Waveform  Comments  +---------------+--------+---------------+----------+--------+  Prox to Stent  70                                         +---------------+--------+---------------+----------+--------+  Proximal Stent 188                    biphasic            +---------------+--------+---------------+----------+--------+  Mid Stent      232     50-99% stenosisbiphasic            +---------------+--------+---------------+----------+--------+  Distal Stent   243     50-99% stenosisbiphasic             +---------------+--------+---------------+----------+--------+  Distal to Stent232     50-99% stenosismonophasic          +---------------+--------+---------------+----------+--------+     Left Stent(s):  +---------------+--------+---------------+----------+--------+  CIA           PSV cm/sStenosis       Waveform  Comments  +---------------+--------+---------------+----------+--------+  Prox to Stent  70                                         +---------------+--------+---------------+----------+--------+  Proximal Stent 235     50-99% stenosisbiphasic            +---------------+--------+---------------+----------+--------+  Mid Stent      186                    biphasic            +---------------+--------+---------------+----------+--------+  Distal Stent   252     50-99% stenosisbiphasic            +---------------+--------+---------------+----------+--------+  Distal to Stent175                    monophasic          +---------------+--------+---------------+----------+--------+     Summary:  Stenosis:  Patent bilateral CIA stents.  Right stent with velocities in ghe 50-99% range.  Left stent with velocities in the 50-99% range.    ASSESSMENT/PLAN:   67 y/o male here for follow up PAD surveillance s/p bilateral iliac stenting as well as Right common femoral to below the knee popliteal bypass as well as left common femoral to PT bypass by Dr. Fields in 2016. He has pain with hills and stairs on the right iliac crest/hip.  The pain is getting more pronounced.  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  The ABI's show a decrease   in the TBI on the right with normal ABI's B LE.    The stents were placed in 2016 and with the new symptoms of worsening right hip pain, sometime at rest we will schedule him for aortogram with femoral artery approach verse radial artery  approach to get a good viw of the common iliac stent sand possible intervention.    I discussed and reviewed the studies with DR. Brabham in clinic today and agrees to do the angiogram.  He does have CKD 09/09/22 Cr was 1.27.  I have also suggested he f/u with Dr. Brooks for lumbar exam to r/o spinal causes of right hip pain as well.  It appears to be musculoskeletal as well.    History of carotid ICA surveillance.  He will have a carotid duplex in 1 year.       Rocio Roam Maureen Damiyah Ditmars PA-C [facility] [place] Office: [phone]  MD in clinic Brabham 

## 2022-10-27 ENCOUNTER — Other Ambulatory Visit: Payer: Self-pay

## 2022-10-27 DIAGNOSIS — I779 Disorder of arteries and arterioles, unspecified: Secondary | ICD-10-CM

## 2022-11-01 ENCOUNTER — Encounter (HOSPITAL_COMMUNITY)
Admission: RE | Disposition: A | Discharge status: Home / Self Care | Payer: Self-pay | Source: Home / Self Care | Attending: Surgery

## 2022-11-01 ENCOUNTER — Other Ambulatory Visit: Payer: Self-pay

## 2022-11-01 ENCOUNTER — Ambulatory Visit (HOSPITAL_COMMUNITY)
Admission: RE | Admit: 2022-11-01 | Discharge: 2022-11-01 | Disposition: A | Discharge status: Home / Self Care | Payer: Medicare Other | Attending: Surgery | Admitting: Surgery

## 2022-11-01 DIAGNOSIS — M25552 Pain in left hip: Secondary | ICD-10-CM | POA: Diagnosis not present

## 2022-11-01 DIAGNOSIS — M25551 Pain in right hip: Secondary | ICD-10-CM | POA: Insufficient documentation

## 2022-11-01 DIAGNOSIS — N184 Chronic kidney disease, stage 4 (severe): Secondary | ICD-10-CM | POA: Insufficient documentation

## 2022-11-01 DIAGNOSIS — I739 Peripheral vascular disease, unspecified: Secondary | ICD-10-CM | POA: Insufficient documentation

## 2022-11-01 DIAGNOSIS — Z7982 Long term (current) use of aspirin: Secondary | ICD-10-CM | POA: Insufficient documentation

## 2022-11-01 DIAGNOSIS — I779 Disorder of arteries and arterioles, unspecified: Secondary | ICD-10-CM

## 2022-11-01 DIAGNOSIS — Z79899 Other long term (current) drug therapy: Secondary | ICD-10-CM | POA: Diagnosis not present

## 2022-11-01 DIAGNOSIS — Z87891 Personal history of nicotine dependence: Secondary | ICD-10-CM | POA: Insufficient documentation

## 2022-11-01 DIAGNOSIS — I129 Hypertensive chronic kidney disease with stage 1 through stage 4 chronic kidney disease, or unspecified chronic kidney disease: Secondary | ICD-10-CM | POA: Insufficient documentation

## 2022-11-01 HISTORY — PX: ABDOMINAL AORTOGRAM W/LOWER EXTREMITY: CATH118223

## 2022-11-01 LAB — POCT I-STAT, CHEM 8
BUN: 25 mg/dL — ABNORMAL HIGH (ref 8–23)
Calcium, Ion: 1.27 mmol/L (ref 1.15–1.40)
Chloride: 101 mmol/L (ref 98–111)
Creatinine, Ser: 1.1 mg/dL (ref 0.61–1.24)
Glucose, Bld: 104 mg/dL — ABNORMAL HIGH (ref 70–99)
HCT: 38 % — ABNORMAL LOW (ref 39.0–52.0)
Hemoglobin: 12.9 g/dL — ABNORMAL LOW (ref 13.0–17.0)
Potassium: 3.9 mmol/L (ref 3.5–5.1)
Sodium: 140 mmol/L (ref 135–145)
TCO2: 27 mmol/L (ref 22–32)

## 2022-11-01 SURGERY — ABDOMINAL AORTOGRAM W/LOWER EXTREMITY
Anesthesia: LOCAL

## 2022-11-01 MED ORDER — LABETALOL HCL 5 MG/ML IV SOLN: 10.0000 mg | INTRAVENOUS | Status: DC | PRN

## 2022-11-01 MED ORDER — MIDAZOLAM HCL 2 MG/2ML IJ SOLN
INTRAMUSCULAR | Status: DC | PRN
  Administered 2022-11-01: 2 mg via INTRAVENOUS

## 2022-11-01 MED ORDER — HEPARIN (PORCINE) IN NACL 1000-0.9 UT/500ML-% IV SOLN
INTRAVENOUS | Status: DC | PRN
  Administered 2022-11-01 (×2): 500 mL

## 2022-11-01 MED ORDER — ACETAMINOPHEN 325 MG PO TABS: 650.0000 mg | ORAL_TABLET | ORAL | Status: DC | PRN

## 2022-11-01 MED ORDER — MIDAZOLAM HCL 2 MG/2ML IJ SOLN
INTRAMUSCULAR | Status: AC
  Filled 2022-11-01: qty 2

## 2022-11-01 MED ORDER — IODIXANOL 320 MG/ML IV SOLN
INTRAVENOUS | Status: DC | PRN
  Administered 2022-11-01: 137 mL

## 2022-11-01 MED ORDER — SODIUM CHLORIDE 0.9% FLUSH: 3.0000 mL | INTRAVENOUS | Status: DC | PRN

## 2022-11-01 MED ORDER — SODIUM CHLORIDE 0.9 % IV SOLN: 250.0000 mL | INTRAVENOUS | Status: DC | PRN

## 2022-11-01 MED ORDER — SODIUM CHLORIDE 0.9 % WEIGHT BASED INFUSION: 1.0000 mL/kg/h | INTRAVENOUS | Status: DC

## 2022-11-01 MED ORDER — OXYCODONE HCL 5 MG PO TABS: 5.0000 mg | ORAL_TABLET | ORAL | Status: DC | PRN

## 2022-11-01 MED ORDER — ONDANSETRON HCL 4 MG/2ML IJ SOLN: 4.0000 mg | Freq: Four times a day (QID) | INTRAMUSCULAR | Status: DC | PRN

## 2022-11-01 MED ORDER — LIDOCAINE HCL (PF) 1 % IJ SOLN
INTRAMUSCULAR | Status: DC | PRN
  Administered 2022-11-01: 15 mL

## 2022-11-01 MED ORDER — FENTANYL CITRATE (PF) 100 MCG/2ML IJ SOLN
INTRAMUSCULAR | Status: DC | PRN
  Administered 2022-11-01: 50 ug via INTRAVENOUS

## 2022-11-01 MED ORDER — SODIUM CHLORIDE 0.9% FLUSH: 3.0000 mL | Freq: Two times a day (BID) | INTRAVENOUS | Status: DC

## 2022-11-01 MED ORDER — LIDOCAINE HCL (PF) 1 % IJ SOLN
INTRAMUSCULAR | Status: AC
  Filled 2022-11-01: qty 30

## 2022-11-01 MED ORDER — HYDROMORPHONE HCL 1 MG/ML IJ SOLN: 0.5000 mg | INTRAMUSCULAR | Status: DC | PRN

## 2022-11-01 MED ORDER — SODIUM CHLORIDE 0.9 % IV SOLN: INTRAVENOUS | Status: DC

## 2022-11-01 MED ORDER — HYDRALAZINE HCL 20 MG/ML IJ SOLN: 5.0000 mg | INTRAMUSCULAR | Status: DC | PRN

## 2022-11-01 MED ORDER — FENTANYL CITRATE (PF) 100 MCG/2ML IJ SOLN
INTRAMUSCULAR | Status: AC
  Filled 2022-11-01: qty 2

## 2022-11-01 SURGICAL SUPPLY — 13 items
CATH OMNI FLUSH 5F 65CM (CATHETERS) IMPLANT
CATH SOFT-VU 4F 65 STRAIGHT (CATHETERS) IMPLANT
CATH SOFT-VU STRAIGHT 4F 65CM (CATHETERS) ×1
KIT MICROPUNCTURE NIT STIFF (SHEATH) IMPLANT
KIT PV (KITS) ×2 IMPLANT
SHEATH PINNACLE 5F 10CM (SHEATH) IMPLANT
SHEATH PROBE COVER 6X72 (BAG) IMPLANT
STOPCOCK MORSE 400PSI 3WAY (MISCELLANEOUS) IMPLANT
SYR MEDRAD MARK 7 150ML (SYRINGE) ×2 IMPLANT
TRANSDUCER W/STOPCOCK (MISCELLANEOUS) ×2 IMPLANT
TRAY PV CATH (CUSTOM PROCEDURE TRAY) ×2 IMPLANT
TUBING CIL FLEX 10 FLL-RA (TUBING) IMPLANT
WIRE BENTSON .035X145CM (WIRE) IMPLANT

## 2022-11-01 NOTE — Op Note (Signed)
    Patient name: Derrick [name] MRN: [id number] DOB: Aug 26, 1955 Sex: male  11/01/2022 Pre-operative Diagnosis: Hip pain, right greater than left Post-operative diagnosis:  Same Surgeon:  Durene Cal Procedure Performed:  1.  Ultrasound-guided access, right femoral artery   2.  Aortogram with bifemoral runoff  3.  Bilateral lower extremity angiogram  4.  Second artery catheterization  5.  Conscious sedation, 40 minutes     Indications: This is a 67 year old gentleman with history of bilateral iliac stenting and bilateral lower extremity bypass grafts who has been having hip pain, right greater than left.  Ultrasound suggested stenosis within his stents.  He comes in today for further evaluation  Procedure:  The patient was identified in the holding area and taken to room 8.  The patient was then placed supine on the table and prepped and draped in the usual sterile fashion.  A time out was called.  Conscious sedation was administered with the use of IV fentanyl and Versed under continuous physician and nurse monitoring.  Heart rate, blood pressure, and oxygen saturation were continuously monitored.  Total sedation time was 40 minutes.  Ultrasound was used to evaluate the right common femoral artery.  It was patent .  A digital ultrasound image was acquired.  A micropuncture needle was used to access the right common femoral artery under ultrasound guidance.  An 018 wire was advanced without resistance and a micropuncture sheath was placed.  The 018 wire was removed and a benson wire was placed.  The micropuncture sheath was exchanged for a 5 french sheath.  An omniflush catheter was advanced over the wire to the level of L-1.  An abdominal angiogram was obtained.  Next, the catheter was pulled down to the aortic bifurcation and bilateral oblique images were obtained of the pelvis.  Next, using the omniflush catheter and a benson wire, the aortic bifurcation was crossed and the catheter was placed  into theleft external iliac artery and left runoff was obtained.  right runoff was performed via retrograde sheath injections.  Findings:   Aortogram: No obvious renal artery stenosis was identified.  The infrarenal abdominal aorta is patent without significant stenosis.  Bilateral common iliac stents are widely patent.  Bilateral external iliac arteries are widely patent.  Bilateral common femoral arteries are widely patent.  A pressure gradient was checked across the stents in the common iliac arteries bilaterally and there was no significant gradient change  Right Lower Extremity: The right common femoral and profundofemoral artery are patent without significant stenosis.  There is a bypass graft originating from the common femoral artery down to the popliteal artery.  There is no evidence of bypass graft stenosis.  The anastomoses are widely patent.  There is three-vessel runoff  Left Lower Extremity: The left common femoral profundofemoral artery are patent without significant stenosis.  The femoral-popliteal bypass graft is widely patent without significant stenosis.  There is three-vessel runoff.  Intervention:  none  Impression:  #1  No significant stenosis within the aortoiliac section.  Specifically no pressure gradient was obtained across bilateral common iliac stents  #2  Bilateral femoral-popliteal bypass grafts are widely patent without stenosis   V. Durene Cal, M.D., [facility] [place] Office: [phone] Pager:  [phone]

## 2022-11-01 NOTE — Progress Notes (Addendum)
Site area: Right groin a 5 french arterial sheath was removed  Level     0  Pressure Applied For 20 MINUTES    Bedrest Beginning at 0920 am X 4 hours  Manual:   Yes.    Patient Status During Pull:  stable  Post Pull Groin Site:  Level 0  Post Pull Instructions Given:  Yes.    Post Pull Pulses Present:  Yes.    Dressing Applied:  Yes.    Comments:

## 2022-11-01 NOTE — Interval H&P Note (Signed)
History and Physical Interval Note:  11/01/2022 7:52 AM  Derrick [name]  has presented today for surgery, with the diagnosis of peripheral artery occlusive disease.  The various methods of treatment have been discussed with the patient and family. After consideration of risks, benefits and other options for treatment, the patient has consented to  Procedure(s): ABDOMINAL AORTOGRAM W/LOWER EXTREMITY (N/A) as a surgical intervention.  The patient's history has been reviewed, patient examined, no change in status, stable for surgery.  I have reviewed the patient's chart and labs.  Questions were answered to the patient's satisfaction.     Durene Cal

## 2022-11-02 ENCOUNTER — Other Ambulatory Visit: Payer: Self-pay | Admitting: Pharmacist

## 2022-11-02 ENCOUNTER — Encounter (HOSPITAL_COMMUNITY): Payer: Self-pay | Admitting: Surgery

## 2022-11-02 ENCOUNTER — Telehealth: Payer: Self-pay | Admitting: Pharmacy Technician

## 2022-11-02 NOTE — Progress Notes (Signed)
Called pt, he is aware Leqvio injection on 6/28 is being moved from [facility] to [facility] and that they will call him to schedule this.

## 2022-11-02 NOTE — Telephone Encounter (Signed)
Derrick [name] note:  Patient will be scheduled as soon as possible.  Auth Submission: NO AUTH NEEDED Site of care: Site of care: [facility] Payer: medicare a/b & [employer] supp Medication & CPT/J Code(s) submitted: Leqvio (Inclisiran) J1306 Route of submission (phone, fax, portal):  Phone # Fax # Auth type: Buy/Bill Units/visits requested: x2 Reference number:  Approval from: 11/02/22 to 05/30/23

## 2022-11-08 ENCOUNTER — Ambulatory Visit (INDEPENDENT_AMBULATORY_CARE_PROVIDER_SITE_OTHER): Payer: Medicare Other

## 2022-11-08 VITALS — BP 152/76 | HR 63 | Temp 97.3°F | Resp 18 | Ht 75.0 in | Wt 235.0 lb

## 2022-11-08 DIAGNOSIS — E782 Mixed hyperlipidemia: Secondary | ICD-10-CM | POA: Diagnosis not present

## 2022-11-08 MED ORDER — INCLISIRAN SODIUM 284 MG/1.5ML ~~LOC~~ SOSY
284.0000 mg | PREFILLED_SYRINGE | Freq: Once | SUBCUTANEOUS | Status: AC
  Administered 2022-11-08: 284 mg via SUBCUTANEOUS
  Filled 2022-11-08: qty 1.5

## 2022-11-08 NOTE — Progress Notes (Signed)
Diagnosis: Hyperlipidemia  Provider:  Mannam, Praveen MD  Procedure: Injection  Leqvio (inclisiran), Dose: 284 mg, Site: subcutaneous, Number of injections: 1  Post Care:     Discharge: Condition: Good, Destination: Home . AVS Provided  Performed by:  Olanna Percifield, RN       

## 2022-11-25 ENCOUNTER — Inpatient Hospital Stay (HOSPITAL_COMMUNITY): Admission: RE | Admit: 2022-11-25 | Payer: Medicare Other | Source: Ambulatory Visit

## 2022-11-29 NOTE — Progress Notes (Unsigned)
Cardiology Office Note:    Date:  11/30/2022  ID:  Derrick [name], DOB 1955-08-21, MRN [id number] PCP: Soundra Pilon, [name]  [facility] Providers Cardiologist:  Kristeen Miss, MD Cardiology APP:  Beatrice Lecher, PA-C       Patient Profile:      Coronary artery disease  Hx PCI to LAD and LCx S/p CABG in 2018 (c/b Post op AFib) TTE 02/12/21: EF 55-60, GLS -24.5, NL RVSF, RVSP 30.7, mild LAE, trivial MR, trivial AI, AV sclerosis w/o AS, RAP 3 Myoview 01/28/21: inf scar, no ischemia, EF 61; low risk  TTE 06/09/2022: EF 55-60, no RWMA, normal RVSF, normal PASP, RVSP 34.8, mild BAE, mild MR, trivial AI, AV sclerosis, RAP 3 Peripheral arterial disease (followed by [facility]) S/p L Fem-Post Tib bypass 2016 S/p R Fem-Pop 2016 S/p bilat CIA stents in 2016 Carotid artery Dz [place] 10/14/21: bilat ICA 1-39, R subcl stenosis Hyperlipidemia  Hypertension  Chronic kidney disease  Sinus brady  Prostate CA      History of Present Illness:   Derrick [name] is a 67 y.o. male who returns for follow-up of CAD.  He was last seen by Dr. Elease Hashimoto in January 2024. He is here alone. He has noted L leg swelling over the past 2 weeks. It mostly resolves w elevation. He has not had chest pain, shortness of breath, syncope, orthopnea. He has not been on any long trips or had any leg injuries. He did have a peripheral angiogram with Dr. Myra Gianotti at the beginning of June that did not demonstrate any significant stenoses.   Review of Systems  Gastrointestinal:  Negative for hematochezia and melena.  Genitourinary:  Negative for hematuria.   See HPI    Studies Reviewed:       Risk Assessment/Calculations:           Physical Exam:   VS:  BP 100/60   Pulse 74   Ht 6\' 2"  (1.88 m)   Wt 235 lb 3.2 oz (106.7 kg)   SpO2 93%   BMI 30.20 kg/m    Wt Readings from Last 3 Encounters:  11/30/22 235 lb 3.2 oz (106.7 kg)  11/08/22 235 lb (106.6 kg)  11/01/22 230 lb (104.3 kg)    Constitutional:      Appearance:  Healthy appearance. Not in distress.  Neck:     Vascular: No JVR. JVD normal.  Pulmonary:     Breath sounds: Normal breath sounds. No wheezing. No rales.  Cardiovascular:     Normal rate. Regular rhythm.     Murmurs: There is no murmur.  Edema:    Peripheral edema present.    Pretibial: 1+ edema of the left pretibial area.    Ankle: 1+ edema of the left ankle. Abdominal:     Palpations: Abdomen is soft.      ASSESSMENT AND PLAN:   Coronary artery disease S/p prior PCI to LAD and LCx. S/p CABG in 2018.  Myoview in September 2022 demonstrated no ischemia.  Echo in January 2024 demonstrated normal EF.  He is doing well without chest discomfort to suggest angina.  Continue ASA 81 mg daily, pravastatin 20 mg daily, inclisiran 284 mg every 6 months.  Follow-up 6 months.  PAD (peripheral artery disease) (HCC) History of prior left femoral to posterior tibial bypass, right femoral to popliteal bypass and bilateral CIA stents in 2016.  He is followed by vascular surgery.  Recent peripheral arteriogram demonstrated patent bypass grafts.  Hyperlipidemia LDL  in April 2024 was optimal at 52.  We took him off of fenofibrate simplify his regimen.  His triglycerides did increase to 220 in April.  Fish oil was increased.  Continue pravastatin 20 mg daily, inclisiran 284 mg every 6 months, ezetimibe 10 mg daily, fish oil.  Obtain follow-up CMET, lipids today.  Hypertension Blood pressure on the low side.  He is asymptomatic.  However, he has lower extremity swelling on the left.  Question if the amlodipine could be contributing.  Decrease amlodipine to 2.5 mg daily.  Continue carvedilol 12.5 mg twice daily, losartan 100 mg daily.  He will continue to monitor his blood pressures at home and let me know if they start to increase.  Leg edema, left He has noted left lower extremity swelling over the past couple of weeks.  He has had this in the past.  It does improve mostly with elevation.  However, he had a  recent peripheral angiogram with vascular surgery.  He also has a recent diagnosis of prostate cancer.  I think it is best to get an ultrasound to completely rule out the possibility of DVT. Left lower extremity venous duplex Increase furosemide to 20 mg daily except take 40 mg on Monday and Thursday He knows to contact me if he takes more than 2 days of extra Lasix so that we can obtain blood work Compression hose, elevation    Dispo:  Return in about 6 months (around 06/02/2023) for Routine Follow Up, w/ Dr. Elease Hashimoto, or Tereso Newcomer, PA-C.  Signed, Tereso Newcomer, PA-C

## 2022-11-30 ENCOUNTER — Ambulatory Visit (HOSPITAL_COMMUNITY)
Admission: RE | Admit: 2022-11-30 | Discharge: 2022-11-30 | Disposition: A | Discharge status: Home / Self Care | Payer: Medicare Other | Source: Ambulatory Visit | Attending: Physician Assistant | Admitting: Physician Assistant

## 2022-11-30 ENCOUNTER — Encounter: Payer: Self-pay | Admitting: Physician Assistant

## 2022-11-30 ENCOUNTER — Telehealth: Payer: Self-pay

## 2022-11-30 ENCOUNTER — Ambulatory Visit (INDEPENDENT_AMBULATORY_CARE_PROVIDER_SITE_OTHER): Payer: Medicare Other | Admitting: Physician Assistant

## 2022-11-30 VITALS — BP 100/60 | HR 74 | Ht 74.0 in | Wt 235.2 lb

## 2022-11-30 DIAGNOSIS — I10 Essential (primary) hypertension: Secondary | ICD-10-CM

## 2022-11-30 DIAGNOSIS — R600 Localized edema: Secondary | ICD-10-CM

## 2022-11-30 DIAGNOSIS — E782 Mixed hyperlipidemia: Secondary | ICD-10-CM

## 2022-11-30 DIAGNOSIS — I25810 Atherosclerosis of coronary artery bypass graft(s) without angina pectoris: Secondary | ICD-10-CM

## 2022-11-30 DIAGNOSIS — I739 Peripheral vascular disease, unspecified: Secondary | ICD-10-CM | POA: Insufficient documentation

## 2022-11-30 LAB — LIPID PANEL
Chol/HDL Ratio: 3 ratio (ref 0.0–5.0)
Cholesterol, Total: 132 mg/dL (ref 100–199)
HDL: 44 mg/dL (ref >39)
LDL Chol Calc (NIH): 56 mg/dL (ref 0–99)
Triglycerides: 195 mg/dL — ABNORMAL HIGH (ref 0–149)
VLDL Cholesterol Cal: 32 mg/dL (ref 5–40)

## 2022-11-30 LAB — COMPREHENSIVE METABOLIC PANEL
ALT: 23 IU/L (ref 0–44)
AST: 16 IU/L (ref 0–40)
Albumin: 4.4 g/dL (ref 3.9–4.9)
Alkaline Phosphatase: 50 IU/L (ref 44–121)
BUN/Creatinine Ratio: 15 (ref 10–24)
BUN: 21 mg/dL (ref 8–27)
Bilirubin Total: 0.5 mg/dL (ref 0.0–1.2)
CO2: 26 mmol/L (ref 20–29)
Calcium: 9.8 mg/dL (ref 8.6–10.2)
Chloride: 99 mmol/L (ref 96–106)
Creatinine, Ser: 1.39 mg/dL — ABNORMAL HIGH (ref 0.76–1.27)
Globulin, Total: 2.4 g/dL (ref 1.5–4.5)
Glucose: 105 mg/dL — ABNORMAL HIGH (ref 70–99)
Potassium: 4.4 mmol/L (ref 3.5–5.2)
Sodium: 141 mmol/L (ref 134–144)
Total Protein: 6.8 g/dL (ref 6.0–8.5)
eGFR: 55 mL/min/{1.73_m2} — ABNORMAL LOW (ref >59)

## 2022-11-30 MED ORDER — FUROSEMIDE 20 MG PO TABS: ORAL_TABLET | ORAL | 3 refills | Status: DC

## 2022-11-30 MED ORDER — AMLODIPINE BESYLATE 2.5 MG PO TABS: 2.5000 mg | ORAL_TABLET | Freq: Every day | ORAL | 3 refills | Status: DC

## 2022-11-30 NOTE — Telephone Encounter (Signed)
LMTCB per DPR. Advised that Cr is elevated, but stable, liver function is normal as is LDL cholesterol. Triglycerides are elevated and may be related to diet, decreasing carbs, alcohol and sweets may improve this number. Advised patient to continue medications and to call our office to schedule CMET in 1 month.

## 2022-11-30 NOTE — Assessment & Plan Note (Signed)
Blood pressure on the low side.  He is asymptomatic.  However, he has lower extremity swelling on the left.  Question if the amlodipine could be contributing.  Decrease amlodipine to 2.5 mg daily.  Continue carvedilol 12.5 mg twice daily, losartan 100 mg daily.  He will continue to monitor his blood pressures at home and let me know if they start to increase.

## 2022-11-30 NOTE — Assessment & Plan Note (Signed)
He has noted left lower extremity swelling over the past couple of weeks.  He has had this in the past.  It does improve mostly with elevation.  However, he had a recent peripheral angiogram with vascular surgery.  He also has a recent diagnosis of prostate cancer.  I think it is best to get an ultrasound to completely rule out the possibility of DVT. Left lower extremity venous duplex Increase furosemide to 20 mg daily except take 40 mg on Monday and Thursday He knows to contact me if he takes more than 2 days of extra Lasix so that we can obtain blood work Compression hose, elevation

## 2022-11-30 NOTE — Assessment & Plan Note (Addendum)
S/p prior PCI to LAD and LCx. S/p CABG in 2018.  Myoview in September 2022 demonstrated no ischemia.  Echo in January 2024 demonstrated normal EF.  He is doing well without chest discomfort to suggest angina.  Continue ASA 81 mg daily, pravastatin 20 mg daily, inclisiran 284 mg every 6 months.  Follow-up 6 months.

## 2022-11-30 NOTE — Assessment & Plan Note (Addendum)
History of prior left femoral to posterior tibial bypass, right femoral to popliteal bypass and bilateral CIA stents in 2016.  He is followed by vascular surgery.  Recent peripheral arteriogram demonstrated patent bypass grafts.

## 2022-11-30 NOTE — Patient Instructions (Signed)
Medication Instructions:  Your physician has recommended you make the following change in your medication:   RERDUCE Amlodipine to 2.5 daily.  You may cut the 5 mg tablets in 1/2 and use them up.  There is a new prescription at [facility] for the 2.5 mg tablets  INCREASE Lasix to 20 mg taking 1 daily on Tuesdays, Wednesdays, Fridays, Saturdays, & Sundays.  Take 2 tables on Mondays and Thursdays  *If you need a refill on your cardiac medications before your next appointment, please call your pharmacy*   Lab Work: TODAY:  CMET & LIPID  If you have labs (blood work) drawn today and your tests are completely normal, you will receive your results only by: MyChart Message (if you have MyChart) OR A paper copy in the mail If you have any lab test that is abnormal or we need to change your treatment, we will call you to review the results.   Testing/Procedures: Your physician has requested that you have a lower or upper extremity venous duplex TODAY IF POSSIBLE. This test is an ultrasound of the veins in the legs or arms. It looks at venous blood flow that carries blood from the heart to the legs or arms. Allow one hour for a Lower Venous exam. Allow thirty minutes for an Upper Venous exam. There are no restrictions or special instructions.     Follow-Up: At [facility], you and your health needs are our priority.  As part of our continuing mission to provide you with exceptional heart care, we have created designated Provider Care Teams.  These Care Teams include your primary Cardiologist (physician) and Advanced Practice Providers (APPs -  Physician Assistants and Nurse Practitioners) who all work together to provide you with the care you need, when you need it.  We recommend signing up for the patient portal called "MyChart".  Sign up information is provided on this After Visit Summary.  MyChart is used to connect with patients for Virtual Visits (Telemedicine).  Patients are able to view  lab/test results, encounter notes, upcoming appointments, etc.  Non-urgent messages can be sent to your provider as well.   To learn more about what you can do with MyChart, go to [email or web address].    Your next appointment:   6 month(s)  Provider:   Kristeen Miss, MD  or Tereso Newcomer, PA-C         Other Instructions

## 2022-11-30 NOTE — Telephone Encounter (Signed)
-----   Message from Beatrice Lecher, [place] sent at 11/30/2022  4:46 PM EDT ----- Results sent to [name] via MyChart. See MyChart comments below. PLAN:  -Repeat BMET 1 month  Mr. Bobb  Your creatinine (kidney function) is increased, but overall stable.  Your potassium, ALT (liver enzyme) are normal.  Your LDL cholesterol is optimal.  Triglycerides are mildly elevated.  Continue to work on diet.  Continue current medications.  I will repeat a metabolic panel in 1 month to keep an eye on your kidney function. Tereso Newcomer, PA-C

## 2022-11-30 NOTE — Assessment & Plan Note (Addendum)
LDL in April 2024 was optimal at 52.  We took him off of fenofibrate simplify his regimen.  His triglycerides did increase to 220 in April.  Fish oil was increased.  Continue pravastatin 20 mg daily, inclisiran 284 mg every 6 months, ezetimibe 10 mg daily, fish oil.  Obtain follow-up CMET, lipids today.

## 2022-12-05 ENCOUNTER — Other Ambulatory Visit: Payer: Self-pay

## 2022-12-05 ENCOUNTER — Telehealth: Payer: Self-pay

## 2022-12-05 DIAGNOSIS — Z79899 Other long term (current) drug therapy: Secondary | ICD-10-CM

## 2022-12-05 DIAGNOSIS — N289 Disorder of kidney and ureter, unspecified: Secondary | ICD-10-CM

## 2022-12-05 NOTE — Telephone Encounter (Signed)
Patient aware of VAS ultrasound. Verbalized understanding

## 2022-12-05 NOTE — Telephone Encounter (Signed)
-----   Message from Beatrice Lecher, [place] sent at 12/05/2022  7:12 AM EDT ----- Patient did not view MyChart comments.  Please notify patient of results. Tereso Newcomer, PA-C    12/05/2022 7:12 AM

## 2022-12-12 ENCOUNTER — Other Ambulatory Visit: Payer: Medicare Other

## 2023-01-02 ENCOUNTER — Ambulatory Visit: Payer: Medicare Other | Attending: Cardiovascular Disease

## 2023-01-02 DIAGNOSIS — N289 Disorder of kidney and ureter, unspecified: Secondary | ICD-10-CM

## 2023-01-02 DIAGNOSIS — Z79899 Other long term (current) drug therapy: Secondary | ICD-10-CM

## 2023-01-05 ENCOUNTER — Other Ambulatory Visit: Payer: Self-pay

## 2023-01-05 MED ORDER — FUROSEMIDE 20 MG PO TABS: ORAL_TABLET | ORAL | 3 refills | Status: DC

## 2023-02-02 ENCOUNTER — Other Ambulatory Visit: Payer: Self-pay | Admitting: Urology

## 2023-02-02 DIAGNOSIS — C61 Malignant neoplasm of prostate: Secondary | ICD-10-CM

## 2023-02-06 ENCOUNTER — Other Ambulatory Visit: Payer: Self-pay | Admitting: Cardiovascular Disease

## 2023-04-03 ENCOUNTER — Ambulatory Visit
Admission: RE | Admit: 2023-04-03 | Discharge: 2023-04-03 | Disposition: A | Discharge status: Home / Self Care | Payer: Medicare Other | Source: Ambulatory Visit | Attending: Urology | Admitting: Urology

## 2023-04-03 DIAGNOSIS — C61 Malignant neoplasm of prostate: Secondary | ICD-10-CM

## 2023-04-03 MED ORDER — GADOPICLENOL 0.5 MMOL/ML IV SOLN
10.0000 mL | Freq: Once | INTRAVENOUS | Status: AC | PRN
  Administered 2023-04-03: 10 mL via INTRAVENOUS

## 2023-04-12 ENCOUNTER — Encounter: Payer: Self-pay | Admitting: Neurology

## 2023-04-24 ENCOUNTER — Other Ambulatory Visit: Payer: Self-pay | Admitting: *Deleted

## 2023-04-24 DIAGNOSIS — I739 Peripheral vascular disease, unspecified: Secondary | ICD-10-CM

## 2023-04-24 DIAGNOSIS — I779 Disorder of arteries and arterioles, unspecified: Secondary | ICD-10-CM

## 2023-05-08 ENCOUNTER — Ambulatory Visit (INDEPENDENT_AMBULATORY_CARE_PROVIDER_SITE_OTHER): Payer: Medicare Other | Admitting: Physician Assistant

## 2023-05-08 ENCOUNTER — Ambulatory Visit (INDEPENDENT_AMBULATORY_CARE_PROVIDER_SITE_OTHER)
Admission: RE | Admit: 2023-05-08 | Discharge: 2023-05-08 | Disposition: A | Discharge status: Home / Self Care | Payer: Medicare Other | Source: Ambulatory Visit | Attending: Surgery | Admitting: Surgery

## 2023-05-08 ENCOUNTER — Encounter: Payer: Self-pay | Admitting: Physician Assistant

## 2023-05-08 ENCOUNTER — Ambulatory Visit (HOSPITAL_COMMUNITY)
Admission: RE | Admit: 2023-05-08 | Discharge: 2023-05-08 | Disposition: A | Discharge status: Home / Self Care | Payer: Medicare Other | Source: Ambulatory Visit | Attending: Surgery | Admitting: Surgery

## 2023-05-08 VITALS — BP 151/76 | HR 48 | Temp 98.3°F | Ht 74.0 in | Wt 242.4 lb

## 2023-05-08 DIAGNOSIS — I779 Disorder of arteries and arterioles, unspecified: Secondary | ICD-10-CM | POA: Diagnosis present

## 2023-05-08 DIAGNOSIS — Z9889 Other specified postprocedural states: Secondary | ICD-10-CM

## 2023-05-08 DIAGNOSIS — I739 Peripheral vascular disease, unspecified: Secondary | ICD-10-CM | POA: Insufficient documentation

## 2023-05-08 DIAGNOSIS — I872 Venous insufficiency (chronic) (peripheral): Secondary | ICD-10-CM

## 2023-05-08 LAB — VAS US ABI WITH/WO TBI: Right ABI: 1.1

## 2023-05-08 NOTE — Progress Notes (Signed)
Office Note     CC:  follow up Requesting Provider:  Soundra Pilon, [name]  HPI: Derrick [name] is a 67 y.o. (09/06/1955) male who presents for routine follow up of PAD. He has remote history of right common femoral to below knee popliteal bypass and left common femoral to PT bypass by Dr. Darrick Penna in 2016. These were both performed for disabling claudication.   Since his bypass grafts he has been without claudication, rest pain or tissue loss.   Today he reports that he has been having some right sided knee pain over past several months. This occurs only while he is sleeping around 3-4 am. Its a sharp pain on the lateral side of the knee that wakes him up. It does not radiate anywhere. His knee and leg otherwise does not bother him on ambulation or rest. He says if he gets up and walks around a bit it goes away. This is the knee he has had prior arthroscopy on before. He otherwise is frustrated with neuropathy in both of his feet, left worse than right. He is on Neurontin but only at night. He says before he retired he could only take it prior to bed because it would make him too drowsy at work. He is scheduled to see a Neurologist soon for further evaluation of  his neuropathy.  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 He also does have swelling, mostly in left leg/ ankle. He does not regularly elevate. He has in the past worn compression stockings but he does not wear them now. He otherwise does not have claudication, rest pain or tissue loss. He says he was previously using a stationary bike but fell off from doing that routinely a couple months ago. He does also golf but not as much in the winter.  He is medically managed on  Aspirin, Statin  Past Medical History:  Diagnosis Date   Chronic kidney disease    "mild"-from taking meloxicam for many years.   Complication of anesthesia    "I was slow to wake up"   Coronary artery disease 2006   stents x2=LAD LCX;  totally occluded RCA b. CABG 03/02/17 x2 (left internal mammary artery to LAD, Y-graft right mammary from left mammary to OM).// Myoview 9/22: Inferior infarct, no ischemia, EF 61; low risk // Echocardiogram 9/22: EF 55-60, no RWMA, GLS -24.5%, normal RVSF, RVSP 30.7, mild LAE, trivial MR, trivial AI, mild AV sclerosis w/o AS   Dyslipidemia    GERD (gastroesophageal reflux disease)    History of bronchitis    Hyperlipidemia    statin intolerant   Hyperparathyroidism    Hypertension    Insomnia    Metatarsalgia    Neuropathy    OA (osteoarthritis)    Peripheral arterial disease (HCC)    Peripheral vascular disease (HCC)    Seasonal allergies    Urinary frequency    Wears glasses     Past Surgical History:  Procedure Laterality Date   ABDOMINAL AORTOGRAM W/LOWER EXTREMITY N/A 11/01/2022   Procedure: ABDOMINAL AORTOGRAM W/LOWER EXTREMITY;  Surgeon: Nada Libman, MD;  Location: [facility];  Service: Cardiovascular;  Laterality: N/A;   ABDOMINAL EXPOSURE N/A 03/31/2021   Procedure: ABDOMINAL EXPOSURE;  Surgeon: Cephus Shelling, MD;  Location: [facility] OR;  Service: Vascular;  Laterality: N/A;   ANTERIOR LUMBAR FUSION N/A 03/31/2021   Procedure: ANTERIOR LUMBAR FUSION 1 LEVEL LUMBAR FIVE TO SACRAL ONE;  Surgeon: Venita Lick, MD;  Location: [facility] OR;  Service: Orthopedics;  Laterality: N/A;  3 C-BED  LEFT TAP BLOCK WITH EXPAREL   CARDIAC CATHETERIZATION  10/28/2004   circ and mid LAD chyper stents   CERVICAL FUSION  2007,2009   CERVICAL SPINE SURGERY  2004   COLONOSCOPY     CORONARY ANGIOPLASTY  2006   stents placed    CORONARY ARTERY BYPASS GRAFT N/A 03/02/2017   Procedure: CORONARY ARTERY BYPASS GRAFTING (CABG), ON PUMP, TIMES TWO, USING BILATERAL MAMMARY ARTERIES;  Surgeon: Loreli Slot, MD;  Location: [facility] OR;  Service: Open Heart Surgery;  Laterality: N/A;   ELBOW ARTHROPLASTY  1994   right   ENDARTERECTOMY FEMORAL Left 03/17/2015   Procedure: ENDARTERECTOMY COMMON  FEMORAL WITH PROFUNDOPLASTY;  Surgeon: Sherren Kerns, MD;  Location: [facility] OR;  Service: Vascular;  Laterality: Left;   FEMORAL-POPLITEAL BYPASS GRAFT Left 03/17/2015   Procedure: BYPASS GRAFT FEMORAL-POPLITEAL ARTERY-LEFT LEG AND POSTERIOR TIBIAL SEQUENTIAL GRAFT TO BELOW KNEE POPLITEAL ARTERY;  Surgeon: Sherren Kerns, MD;  Location: [facility] OR;  Service: Vascular;  Laterality: Left;   FEMORAL-POPLITEAL BYPASS GRAFT Right 05/11/2015   Procedure:  RIGHT FEMORAL-BELOW THE KNEE POPLITEAL ARTERY BYPASS GRAFT USING NON REVERSE RIGHT GREATER SAPHENOUS VEIN;  Surgeon: Sherren Kerns, MD;  Location: [facility] OR;  Service: Vascular;  Laterality: Right;   INCISION AND DRAINAGE ABSCESS POSTERIOR CERVICALSPINE  2009   INTRAOPERATIVE ARTERIOGRAM Left 03/17/2015   Procedure: INTRA OPERATIVE ARTERIOGRAM X2;  Surgeon: Sherren Kerns, MD;  Location: [facility] OR;  Service: Vascular;  Laterality: Left;   INTRAOPERATIVE ARTERIOGRAM Right 05/11/2015   Procedure: INTRA OPERATIVE ARTERIOGRAM;  Surgeon: Sherren Kerns, MD;  Location: [facility] OR;  Service: Vascular;  Laterality: Right;   LEFT HEART CATH AND CORONARY ANGIOGRAPHY N/A 03/01/2017   Procedure: LEFT HEART CATH AND CORONARY ANGIOGRAPHY;  Surgeon: Kathleene Hazel, MD;  Location: [facility];  Service: Cardiovascular;  Laterality: N/A;   PATCH ANGIOPLASTY Left 03/17/2015   Procedure: VEIN PATCH ANGIOPLASTY COMMON FEMORAL ARTERY;  Surgeon: Sherren Kerns, MD;  Location: [facility] OR;  Service: Vascular;  Laterality: Left;   PERIPHERAL VASCULAR CATHETERIZATION N/A 01/16/2015   Procedure: Abdominal Aortogram;  Surgeon: Sherren Kerns, MD;  Location: [facility] INVASIVE CV LAB;  Service: Cardiovascular;  Laterality: N/A;   SHOULDER OPEN ROTATOR CUFF REPAIR Right 04/23/2013   Procedure: RIGHT TWO TENDON ROTATOR CUFF REPAIR  SUBACROMIAL DECOMPRESSION AND OPEN DISTAL CLAVICLE RESECTION;  Surgeon: Wyn Forster., MD;  Location: [facility];  Service: Orthopedics;   Laterality: Right;   stents placed   2006   TEE WITHOUT CARDIOVERSION N/A 03/02/2017   Procedure: TRANSESOPHAGEAL ECHOCARDIOGRAM (TEE);  Surgeon: Loreli Slot, MD;  Location: [facility] OR;  Service: Open Heart Surgery;  Laterality: N/A;   VEIN HARVEST Right 05/11/2015   Procedure: WITH NON REVERSE RIGHT GREATER SAPHENOUS VEIN HARVEST;  Surgeon: Sherren Kerns, MD;  Location: [facility] OR;  Service: Vascular;  Laterality: Right;    Social History   Socioeconomic History   Marital status: Married    Spouse name: Not on file   Number of children: 1   Years of education: 12   Highest education level: High school graduate  Occupational History   Occupation: Retired  Tobacco Use   Smoking status: Former    Current packs/day: 0.00    Types: Cigarettes    Quit date: 05/30/1989    Years since quitting: 33.9    Passive exposure: Never  Smokeless tobacco: Never  Vaping Use   Vaping status: Never Used  Substance and Sexual Activity   Alcohol use: Yes    Comment: 1 beer every 6 months   Drug use: Never   Sexual activity: Not on file  Other Topics Concern   Not on file  Social History Narrative   Lives with his wife.   One stepson.   Left-handed.   One cup caffeine per week.    Social Determinants of Health   Financial Resource Strain: Not on file  Food Insecurity: Not on file  Transportation Needs: Not on file  Physical Activity: Not on file  Stress: Not on file  Social Connections: Not on file  Intimate Partner Violence: Not on file    Family History  Problem Relation Age of Onset   Arthritis Mother    Varicose Veins Mother    Lung cancer Father    COPD Father        Lung   Cancer Brother        Lukemia   COPD Brother    Cancer Brother        Prostate    Current Outpatient Medications  Medication Sig Dispense Refill   acetaminophen (TYLENOL) 650 MG CR tablet Take 1,300 mg by mouth in the morning and at bedtime.     albuterol (VENTOLIN HFA) 108 (90 Base) MCG/ACT  inhaler Inhale 2 puffs into the lungs every 4 (four) hours as needed for wheezing or shortness of breath.     aspirin 81 MG EC tablet Take 81 mg by mouth in the morning.     calcitRIOL (ROCALTROL) 0.5 MCG capsule Take 0.5 mcg by mouth in the morning.     Calcium Carb-Cholecalciferol (CALCIUM 600+D3 PO) Take 1 tablet by mouth in the morning and at bedtime.     carvedilol (COREG) 12.5 MG tablet TAKE 1 TABLET BY MOUTH TWICE A DAY 180 tablet 3   Cyanocobalamin 2500 MCG TABS Take 2,500 mcg by mouth in the morning.     ezetimibe (ZETIA) 10 MG tablet Take 10 mg by mouth every evening.     famotidine (PEPCID) 20 MG tablet Take 20 mg by mouth 2 (two) times daily.     fexofenadine (ALLEGRA ODT) 30 MG disintegrating tablet Take 30 mg by mouth daily.     fish oil-omega-3 fatty acids 1000 MG capsule Take 1 capsule (1,000 mg total) by mouth in the morning and at bedtime. 60 capsule 0   furosemide (LASIX) 20 MG tablet TAKE 1 TABLET BY MOUTH DAILY EXCEPT MONDAYS AND THURSDAYS TAKE 2 TABLETS BY MOUTH DAILY 98 tablet 3   gabapentin (NEURONTIN) 300 MG capsule Take 300 mg by mouth at bedtime.     ipratropium (ATROVENT HFA) 17 MCG/ACT inhaler Inhale 2 puffs into the lungs every 6 (six) hours as needed for wheezing.     ipratropium (ATROVENT) 0.03 % nasal spray Place 1 spray into both nostrils 2 (two) times daily as needed for rhinitis.     losartan (COZAAR) 100 MG tablet Take 100 mg by mouth every evening.      Magnesium 250 MG TABS Take 250 mg by mouth every evening.     Menthol, Topical Analgesic, (BIOFREEZE EX) Apply 1 Application topically 3 (three) times daily as needed (back pain/soreness).     nitroGLYCERIN (NITROSTAT) 0.4 MG SL tablet Place 1 tablet (0.4 mg total) under the tongue every 5 (five) minutes as needed for chest pain. 25 tablet 3   potassium chloride (  KLOR-CON) 10 MEQ tablet TAKE 1 TABLET EVERY DAY 90 tablet 2   pravastatin (PRAVACHOL) 20 MG tablet Take 1 tablet (20 mg total) by mouth every evening.  90 tablet 3   Propylene Glycol (SYSTANE BALANCE) 0.6 % SOLN Place 1 drop into both eyes in the morning and at bedtime.     tamsulosin (FLOMAX) 0.4 MG CAPS capsule Take 0.4 mg by mouth 2 (two) times daily.     TART CHERRY PO Take 3,000 mg by mouth every evening.     traZODone (DESYREL) 50 MG tablet Take 100 mg by mouth at bedtime.     amLODipine (NORVASC) 2.5 MG tablet Take 1 tablet (2.5 mg total) by mouth daily. 90 tablet 3   Current Facility-Administered Medications  Medication Dose Route Frequency Provider Last Rate Last Admin   inclisiran (LEQVIO) injection 284 mg  284 mg Subcutaneous Once Nahser, Deloris Ping, MD        Allergies  Allergen Reactions   Codeine Itching and Other (See Comments)   Atorvastatin Other (See Comments)    Muscle aches Muscle aches   Crestor [Rosuvastatin Calcium]     Muscle aches   Meloxicam Other (See Comments)    Can not take due to kidneys   Statins Other (See Comments)    myalgia     REVIEW OF SYSTEMS:  [X]  denotes positive finding, [ ]  denotes negative finding Cardiac  Comments:  Chest pain or chest pressure:    Shortness of breath upon exertion:    Short of breath when lying flat:    Irregular heart rhythm:        Vascular    Pain in calf, thigh, or hip brought on by ambulation:    Pain in feet at night that wakes you up from your sleep:     Blood clot in your veins:    Leg swelling:  X       Pulmonary    Oxygen at home:    Productive cough:     Wheezing:         Neurologic    Sudden weakness in arms or legs:     Sudden numbness in arms or legs:     Sudden onset of difficulty speaking or slurred speech:    Temporary loss of vision in one eye:     Problems with dizziness:         Gastrointestinal  Bloating, changes in bowels- constipation  Blood in stool:     Vomited blood:         Genitourinary    Burning when urinating:     Blood in urine:        Psychiatric    Major depression:         Hematologic    Bleeding problems:     Problems with blood clotting too easily:        Skin    Rashes or ulcers:        Constitutional    Fever or chills:      PHYSICAL EXAMINATION:  Vitals:   05/08/23 1019  BP: (!) 151/76  Pulse: (!) 48  Temp: 98.3 F (36.8 C)  TempSrc: Temporal  SpO2: 97%  Weight: 242 lb 6.4 oz (110 kg)  Height: 6\' 2"  (1.88 m)    General:  WDWN in NAD; vital signs documented above Gait: Normal HENT: WNL, normocephalic Pulmonary: normal non-labored breathing , without wheezing Cardiac: regular HR Abdomen: soft, NT Vascular Exam/Pulses: 2+ femoral, 2+ DP pulses  bilaterally, feet warm and well perfused Extremities: without ischemic changes, without Gangrene , without cellulitis; without open wounds; reticular veins present bilateral ankles, edema of left > right ankle Musculoskeletal: no muscle wasting or atrophy  Neurologic: A&O X 3 Psychiatric:  The pt has Normal affect.   Non-Invasive Vascular Imaging:   +-------+-----------+-----------+------------+------------+  ABI/TBIToday's ABIToday's TBIPrevious ABIPrevious TBI  +-------+-----------+-----------+------------+------------+  Right 1.10       0.68       1.05        0.69          +-------+-----------+-----------+------------+------------+  Left           0.85       1.03        0.76          +-------+-----------+-----------+------------+------------+   VAS [place] Lower extremity bypass graft duplex (bilateral):  Summary:  Right: Patent graft with no stenosis.  Left: Patent graft with no stenosis. Biphasic waveforms appreciated throughout  VAS US Aorta/iliac/ IVC duplex: Summary:  Stenosis:  The visualized segments of the right common iliac artery stent appear patent.  The left common iliac artery stent is not well visualized.  This was a technically difficult, suboptimal exam.   ASSESSMENT/PLAN:: 67 y.o. male here for follow up of PAD. He has remote history of right common femoral to below knee popliteal bypass  and left common femoral to PT bypass by Dr. Darrick Penna in 2016. These were both performed for disabling claudication. Since his bypass grafts he has been without claudication, rest pain or tissue loss. He does suffer from neuropathy, which is very disabling to him. He is scheduled to see Neurologist soon for evaluation - ABIs today are overall stable - Duplex shows bilateral patents bypass grafts. Iliac duplex did not visualize the iliac stents well but based on waveforms these appear patent - encourage daily elevation above level of heart 20-30 minutes daily - Can wear knee high compression stockings as needed for swelling - Encourage walking/ exercise routine - Continue Aspirin and statin - he will follow up again in 6 months with repeat BLE bypass graft duplex and ABIs, and Aorto/iliac duplex   Graceann Congress, PA-C [facility] [phone]  Clinic MD:   Myra Gianotti

## 2023-05-10 ENCOUNTER — Ambulatory Visit: Payer: Medicare Other

## 2023-05-10 VITALS — BP 159/75 | HR 56 | Temp 97.6°F | Resp 18 | Ht 74.0 in | Wt 242.8 lb

## 2023-05-10 DIAGNOSIS — E782 Mixed hyperlipidemia: Secondary | ICD-10-CM | POA: Diagnosis not present

## 2023-05-10 MED ORDER — INCLISIRAN SODIUM 284 MG/1.5ML ~~LOC~~ SOSY
284.0000 mg | PREFILLED_SYRINGE | Freq: Once | SUBCUTANEOUS | Status: AC
  Administered 2023-05-10: 284 mg via SUBCUTANEOUS
  Filled 2023-05-10: qty 1.5

## 2023-05-10 NOTE — Progress Notes (Signed)
Diagnosis: Hyperlipidemia  Provider:  Chilton Greathouse MD  Procedure: Injection  Leqvio (inclisiran), Dose: 284 mg, Site: subcutaneous, Number of injections: 1  Injection Site(s): Right arm  Post Care: Patient declined observation  Discharge: Condition: Good, Destination: Home . AVS Provided  Performed by:  Loney Hering, LPN

## 2023-05-11 ENCOUNTER — Other Ambulatory Visit: Payer: Self-pay

## 2023-05-11 DIAGNOSIS — I739 Peripheral vascular disease, unspecified: Secondary | ICD-10-CM

## 2023-05-18 NOTE — Progress Notes (Signed)
 Initial neurology clinic note  Reason for Evaluation: Consultation requested by Candance Jeoffrey SAILOR, PA-C for an opinion regarding leg pain and weakness. My final recommendations will be communicated back to the requesting physician by way of shared medical record or letter to requesting physician via [place]  mail.  HPI: This is Mr. Derrick [name], a 67 y.o. left-handed male with a medical history of CAD s/p PCI and CABG, carotid stenosis, PVD c/b claudication s/p right common femoral to below knee popliteal bypass and left common femoral to PT bypass (2016), HTN, HLD, CKD, prostate cancer, OA, cervical spine disease s/p 3 neck surgeries (C3-C7 fusion), lumbar spine disease s/p lumbar surgery, myofascial pain s/p trigger point injections who presents to neurology clinic with the chief complaint of leg pain and weakness. The patient is accompanied by wife.  Patient's symptoms have been present for at least 6 years. 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 Over the years, things have progressively gotten worse. He felt is due to all of the standing and wearing boots at work. He feels his legs are getting weak now. He has swelling of his left leg. He notices symptoms on both sides, but the left is worse than the right. He also endorses cramps. He has imbalance, but has not had any falls in the last few years. He endorses arthritis in his hands but no numbness, tingling, or burning in the hands.  He also has left hip pain that is is not sure if it is related or not. He has a history of back pain. He had lumbar spine surgery in 2022. He has also had bypass surgery in both legs for PVD.  Per [facility] note from 04/11/23 patient had an EMG that showed chronic L4-S1 radic but also PN.  Patient has seen Dr. Onita in the past (last 04/29/2020) for neuropathy. EMG of bilateral lower extremities on 04/29/20 was normal with no evidence of neuropathy or  radiculopathy.  Patient takes gabapentin  300 mg at bedtime. He has been on this for 3-4 years. He is not sure if this is helping. He has done injections in the spine previously as well, which helped at first, but not for long. He also takes B12 2500 mcg daily.  He takes aspirin  81 mg daily for vascular disease.  He was recently diagnosed with prostate cancer but has not had any chemotherapy or radiation.  The patient does not report symptoms referable to autonomic dysfunction including impaired sweating, heat or cold intolerance, excessive mucosal dryness, gastroparetic early satiety, postprandial abdominal bloating, constipation, bowel or bladder dyscontrol, or syncope/presyncope/orthostatic intolerance.  He report any constitutional symptoms like fever, night sweats, anorexia or unintentional weight loss.  EtOH use: 1 beer every 6 months.   Restrictive diet? No Family history of neuropathy/myopathy/neuropathy disease? Mother had hammertoes and had to have a toe amputated  MEDICATIONS:  Outpatient Encounter Medications as of 06/01/2023  Medication Sig   acetaminophen  (TYLENOL ) 650 MG CR tablet Take 1,300 mg by mouth in the morning and at bedtime.   albuterol (VENTOLIN HFA) 108 (90 Base) MCG/ACT inhaler Inhale 2 puffs into the lungs every 4 (four) hours as needed for wheezing or shortness of breath.   amLODipine  (NORVASC ) 2.5 MG tablet Take 1 tablet (2.5 mg total) by mouth daily.   aspirin  81 MG EC tablet Take 81 mg by mouth in the morning.   calcitRIOL  (ROCALTROL ) 0.5 MCG capsule Take 0.5 mcg by mouth in  the morning.   Calcium  Carb-Cholecalciferol (CALCIUM  600+D3 PO) Take 1 tablet by mouth in the morning and at bedtime.   carvedilol  (COREG ) 12.5 MG tablet TAKE 1 TABLET BY MOUTH TWICE A DAY   Cyanocobalamin 2500 MCG TABS Take 2,500 mcg by mouth in the morning.   ezetimibe  (ZETIA ) 10 MG tablet Take 10 mg by mouth every evening.   famotidine  (PEPCID ) 20 MG tablet Take 20 mg by mouth 2 (two)  times daily.   fexofenadine (ALLEGRA ODT) 30 MG disintegrating tablet Take 30 mg by mouth daily.   fish oil-omega-3 fatty acids  1000 MG capsule Take 1 capsule (1,000 mg total) by mouth in the morning and at bedtime.   furosemide  (LASIX ) 20 MG tablet TAKE 1 TABLET BY MOUTH DAILY EXCEPT MONDAYS AND THURSDAYS TAKE 2 TABLETS BY MOUTH DAILY   gabapentin  (NEURONTIN ) 300 MG capsule Take 300 mg by mouth at bedtime.   ipratropium (ATROVENT HFA) 17 MCG/ACT inhaler Inhale 2 puffs into the lungs every 6 (six) hours as needed for wheezing.   ipratropium (ATROVENT) 0.03 % nasal spray Place 1 spray into both nostrils 2 (two) times daily as needed for rhinitis.   losartan  (COZAAR ) 100 MG tablet Take 100 mg by mouth every evening.    Magnesium  250 MG TABS Take 250 mg by mouth every evening.   Menthol , Topical Analgesic, (BIOFREEZE EX) Apply 1 Application topically 3 (three) times daily as needed (back pain/soreness).   nitroGLYCERIN  (NITROSTAT ) 0.4 MG SL tablet Place 1 tablet (0.4 mg total) under the tongue every 5 (five) minutes as needed for chest pain.   potassium chloride  (KLOR-CON ) 10 MEQ tablet TAKE 1 TABLET EVERY DAY   pravastatin  (PRAVACHOL ) 20 MG tablet Take 1 tablet (20 mg total) by mouth every evening.   Propylene Glycol (SYSTANE BALANCE) 0.6 % SOLN Place 1 drop into both eyes in the morning and at bedtime.   tamsulosin  (FLOMAX ) 0.4 MG CAPS capsule Take 0.4 mg by mouth 2 (two) times daily.   TART CHERRY PO Take 3,000 mg by mouth every evening.   traZODone  (DESYREL ) 50 MG tablet Take 100 mg by mouth at bedtime.   Facility-Administered Encounter Medications as of 06/01/2023  Medication   inclisiran (LEQVIO ) injection 284 mg    PAST MEDICAL HISTORY: Past Medical History:  Diagnosis Date   Chronic kidney disease    mild-from taking meloxicam for many years.   Complication of anesthesia    I was slow to wake up   Coronary artery disease 2006   stents x2=LAD LCX; totally occluded RCA b. CABG  03/02/17 x2 (left internal mammary artery to LAD, Y-graft right mammary from left mammary to OM).// Myoview  9/22: Inferior infarct, no ischemia, EF 61; low risk // Echocardiogram 9/22: EF 55-60, no RWMA, GLS -24.5%, normal RVSF, RVSP 30.7, mild LAE, trivial MR, trivial AI, mild AV sclerosis w/o AS   Dyslipidemia    GERD (gastroesophageal reflux disease)    History of bronchitis    Hyperlipidemia    statin intolerant   Hyperparathyroidism    Hypertension    Insomnia    Metatarsalgia    Neuropathy    OA (osteoarthritis)    Peripheral arterial disease (HCC)    Peripheral vascular disease (HCC)    Seasonal allergies    Urinary frequency    Wears glasses     PAST SURGICAL HISTORY: Past Surgical History:  Procedure Laterality Date   ABDOMINAL AORTOGRAM W/LOWER EXTREMITY N/A 11/01/2022   Procedure: ABDOMINAL AORTOGRAM W/LOWER EXTREMITY;  Surgeon: Serene,  Gaile ORN, MD;  Location: [facility];  Service: Cardiovascular;  Laterality: N/A;   ABDOMINAL EXPOSURE N/A 03/31/2021   Procedure: ABDOMINAL EXPOSURE;  Surgeon: Gretta Lonni PARAS, MD;  Location[facility] OR;  Service: Vascular;  Laterality: N/A;   ANTERIOR LUMBAR FUSION N/A 03/31/2021   Procedure: ANTERIOR LUMBAR FUSION 1 LEVEL LUMBAR FIVE TO SACRAL ONE;  Surgeon: Burnetta Aures, MD;  Location: [facility] OR;  Service: Orthopedics;  Laterality: N/A;  3 C-BED  LEFT TAP BLOCK WITH EXPAREL    CARDIAC CATHETERIZATION  10/28/2004   circ and mid LAD chyper stents   CERVICAL FUSION  2007,2009   CERVICAL SPINE SURGERY  2004   COLONOSCOPY     CORONARY ANGIOPLASTY  2006   stents placed    CORONARY ARTERY BYPASS GRAFT N/A 03/02/2017   Procedure: CORONARY ARTERY BYPASS GRAFTING (CABG), ON PUMP, TIMES TWO, USING BILATERAL MAMMARY ARTERIES;  Surgeon: Kerrin Elspeth BROCKS, MD;  Location: [facility] OR;  Service: Open Heart Surgery;  Laterality: N/A;   ELBOW ARTHROPLASTY  1994   right   ENDARTERECTOMY FEMORAL Left 03/17/2015   Procedure: ENDARTERECTOMY COMMON FEMORAL  WITH PROFUNDOPLASTY;  Surgeon: Carlin FORBES Haddock, MD;  Location: [facility] OR;  Service: Vascular;  Laterality: Left;   FEMORAL-POPLITEAL BYPASS GRAFT Left 03/17/2015   Procedure: BYPASS GRAFT FEMORAL-POPLITEAL ARTERY-LEFT LEG AND POSTERIOR TIBIAL SEQUENTIAL GRAFT TO BELOW KNEE POPLITEAL ARTERY;  Surgeon: Carlin FORBES Haddock, MD;  Location: [facility] OR;  Service: Vascular;  Laterality: Left;   FEMORAL-POPLITEAL BYPASS GRAFT Right 05/11/2015   Procedure:  RIGHT FEMORAL-BELOW THE KNEE POPLITEAL ARTERY BYPASS GRAFT USING NON REVERSE RIGHT GREATER SAPHENOUS VEIN;  Surgeon: Carlin FORBES Haddock, MD;  Location: [facility] OR;  Service: Vascular;  Laterality: Right;   INCISION AND DRAINAGE ABSCESS POSTERIOR CERVICALSPINE  2009   INTRAOPERATIVE ARTERIOGRAM Left 03/17/2015   Procedure: INTRA OPERATIVE ARTERIOGRAM X2;  Surgeon: Carlin FORBES Haddock, MD;  Location: [facility] OR;  Service: Vascular;  Laterality: Left;   INTRAOPERATIVE ARTERIOGRAM Right 05/11/2015   Procedure: INTRA OPERATIVE ARTERIOGRAM;  Surgeon: Carlin FORBES Haddock, MD;  Location: [facility] OR;  Service: Vascular;  Laterality: Right;   LEFT HEART CATH AND CORONARY ANGIOGRAPHY N/A 03/01/2017   Procedure: LEFT HEART CATH AND CORONARY ANGIOGRAPHY;  Surgeon: Verlin Lonni BIRCH, MD;  Location: [facility];  Service: Cardiovascular;  Laterality: N/A;   PATCH ANGIOPLASTY Left 03/17/2015   Procedure: VEIN PATCH ANGIOPLASTY COMMON FEMORAL ARTERY;  Surgeon: Carlin FORBES Haddock, MD;  Location: [facility] OR;  Service: Vascular;  Laterality: Left;   PERIPHERAL VASCULAR CATHETERIZATION N/A 01/16/2015   Procedure: Abdominal Aortogram;  Surgeon: Carlin FORBES Haddock, MD;  Location: [facility] INVASIVE CV LAB;  Service: Cardiovascular;  Laterality: N/A;   SHOULDER OPEN ROTATOR CUFF REPAIR Right 04/23/2013   Procedure: RIGHT TWO TENDON ROTATOR CUFF REPAIR  SUBACROMIAL DECOMPRESSION AND OPEN DISTAL CLAVICLE RESECTION;  Surgeon: Lamar LULLA Leonor Mickey., MD;  Location: [facility];  Service: Orthopedics;   Laterality: Right;   stents placed   2006   TEE WITHOUT CARDIOVERSION N/A 03/02/2017   Procedure: TRANSESOPHAGEAL ECHOCARDIOGRAM (TEE);  Surgeon: Kerrin Elspeth BROCKS, MD;  Location: [facility] OR;  Service: Open Heart Surgery;  Laterality: N/A;   VEIN HARVEST Right 05/11/2015   Procedure: WITH NON REVERSE RIGHT GREATER SAPHENOUS VEIN HARVEST;  Surgeon: Carlin FORBES Haddock, MD;  Location: [facility] OR;  Service: Vascular;  Laterality: Right;    ALLERGIES: Allergies  Allergen Reactions   Codeine Itching and Other (See Comments)   Atorvastatin Other (See Comments)  Muscle aches Muscle aches   Crestor [Rosuvastatin Calcium ]     Muscle aches   Meloxicam Other (See Comments)    Can not take due to kidneys   Statins Other (See Comments)    myalgia    FAMILY HISTORY: Family History  Problem Relation Age of Onset   Arthritis Mother    Varicose Veins Mother    Lung cancer Father    COPD Father        Lung   Cancer Brother        Lukemia   COPD Brother    Cancer Brother        Prostate    SOCIAL HISTORY: Social History   Tobacco Use   Smoking status: Former    Current packs/day: 0.00    Types: Cigarettes    Quit date: 05/30/1989    Years since quitting: 34.0    Passive exposure: Never   Smokeless tobacco: Never  Vaping Use   Vaping status: Never Used  Substance Use Topics   Alcohol use: Yes    Comment: 1 beer every 6 months   Drug use: Never   Social History   Social History Narrative   Lives with his wife.   One stepson.   Left-handed.   One cup caffeine per week.    Are you currently employed ?    What is your current occupation? retired   Do you live at home alone?   Who lives with you? wife   What type of home do you live in: 1 story or 2 story? one         OBJECTIVE: PHYSICAL EXAM: BP (!) 153/67 (Patient Position: Sitting, Cuff Size: Large)   Pulse 60   Ht 6' 2 (1.88 m)   Wt 242 lb (109.8 kg)   SpO2 95%   BMI 31.07 kg/m   General: General appearance: Awake  and alert. No distress. Cooperative with exam.  Skin: No obvious rash or jaundice. HEENT: Atraumatic. Anicteric. Lungs: Non-labored breathing on room air  Heart: Regular Extremities: Mild peripheral edema. Arthritic changes in hands and feet. Psych: Affect appropriate.  Neurological: Mental Status: Alert. Speech fluent. No pseudobulbar affect Cranial Nerves: CNII: No RAPD. Visual fields grossly intact. CNIII, IV, VI: PERRL. No nystagmus. EOMI. CN V: Facial sensation intact bilaterally to fine touch. [place] clench strong. CN VII: Facial muscles symmetric and strong. No ptosis at rest. CN VIII: Hearing grossly intact bilaterally. CN IX: No hypophonia. CN X: Palate elevates symmetrically. CN XI: Full strength shoulder shrug bilaterally. CN XII: Tongue protrusion full and midline. No atrophy or fasciculations. No significant dysarthria Motor: Tone is normal  Individual muscle group testing (MRC grade out of 5):  Movement     Neck flexion 5    Neck extension 5     Right Left   Shoulder abduction 5 5   Elbow flexion 5 5   Elbow extension 5 5   Finger abduction - FDI 5 5   Finger abduction - ADM 5 5   Finger extension 5 5   Finger distal flexion - 2/3 5 5    Finger distal flexion - 4/5 5 5    Thumb flexion - FPL 5 5   Thumb abduction - APB 5 5    Hip flexion 5 5   Hip extension 5 5   Hip adduction 5 5   Hip abduction 5 5   Knee extension 5 5   Knee flexion 5 5   Dorsiflexion 5 5  Plantarflexion 5 5   Inversion 5 5   Eversion 5 5   Great toe extension 5 5   Great toe flexion 4+ 4+     Reflexes:  Right Left   Bicep 1+ 1+   Tricep 1+ 1+   BrRad 1+ 1+   Knee 2+ 2+   Ankle 0 0    Pathological Reflexes: Babinski: flexor response bilaterally Hoffman: absent bilaterally Troemner: absent bilaterally Sensation: Pinprick: Diminished in bilateral feet and medial aspect of lower legs bilaterally to the knees; intact in upper extremities Vibration: Intact in upper  extremities. Absent in bilateral great toes, diminished in bilateral ankles, intact in bilateral patella. Proprioception: Intact in bilateral great toes. Coordination: Intact finger-to- nose-finger bilaterally. Romberg with mild sway. Gait: Able to rise from chair with arms crossed unassisted. Normal, narrow-based gait. Able to walk on toes and heels.  Lab and Test Review: Internal labs: BMP (01/02/23): significant for glucose of 160 Lipid panel (11/30/22): tChol 132, LDL 56, TG 195 CMET (11/30/22): significant for Cr 1.39 TSH (05/13/22): wnl CBC (05/13/22): wnl  04/29/20: HbA1c: 5.6 CK: 126 ANA neg B12: 1455  Imaging: CT head and maxillofacial wo contrast (09/28/20): IMPRESSION: CT head:   No evidence of acute intracranial abnormality.   CT maxillofacial:   1. No evidence of acute maxillofacial fracture. 2. Right periorbital and maxillofacial hematoma with laceration.  MRI cervical spine wo contrast (04/21/20): FINDINGS:    On sagittal views the vertebral bodies have normal height and alignment. Multi-level cervical discectomy fusion C3-C7 levels with metallic hardware. The spinal cord is normal in size and appearance. The posterior fossa, pituitary gland and paraspinal soft tissues are unremarkable.     On axial views: C2-3: disc bulging and uncovertebral joint hypertrophy with mild left foraminal stenosis  C3-4: no spinal stenosis or foraminal narrowing  C4-5: no spinal stenosis or foraminal narrowing  C5-6: no spinal stenosis or foraminal narrowing  C6-7: no spinal stenosis or foraminal narrowing  C7-T1: rightward disc protrusion with moderate right foraminal stenosis  T1-2: no spinal stenosis or foraminal narrowing    Limited views of the soft tissues of the head and neck are unremarkable.     IMPRESSION:    MRI cervical spine (without) demonstrating: - At C7-T1: rightward disc protrusion with moderate right foraminal stenosis. - At C2-3: disc bulging and uncovertebral  joint hypertrophy with mild left foraminal stenosis. - Multi-level cervical discectomy fusion C3-C7 levels with metallic hardware.   MRI lumbar spine (12/29/2014): FINDINGS: There is minimal curvature convex to the right with the apex at L3. There is no significant degenerative change at T12-L1 or L1-2. The distal cord and conus are normal with the conus tip at lower L1.   L2-3: Disc degeneration with moderate circumferential bulging. Mild facet degeneration and ligamentous hypertrophy. Mild narrowing of both lateral recesses without gross neural compression.   L3-4: Disc degeneration with moderate circumferential bulging. Facet degeneration and ligamentous hypertrophy. Mild stenosis of both lateral recesses. There would be some potential for neural compression or irritation in this location.   L4-5: Advanced disc degeneration with loss of height. Endplate osteophytes and circumferential protrusion of the disc. Facet degeneration with mild ligamentous hypertrophy. Narrowing of both lateral recesses that would have potential for neural compression. Mild foraminal narrowing bilaterally as well.   L5-S1: Advanced disc degeneration with near complete loss of disc height. Endplate osteophytes and mild bulging of residual disc material. Mild facet degeneration and hypertrophy. No central canal or lateral recess stenosis. Mild foraminal  narrowing without definite compression of the exiting L5 nerve roots.   IMPRESSION: Degenerative disc disease and degenerative facet disease throughout the lower lumbar region that could certainly be associated with low back pain.   Bulging disc sent facet hypertrophy with lateral recess narrowing that could possibly be symptomatic at L3-4 and L4-5. Mild foraminal narrowing bilaterally at L4-5 and L5-S1 that could possibly be associated with neural irritation. Definite neural compression is not demonstrated however.  EMG (04/29/20 at [facility] by Dr.  Onita): Summary of the test: Nerve conduction study: Bilateral sural, superficial peroneal sensory responses were normal.   Bilateral peroneal to EDB and tibial motor responses were normal.   Electromyography: Selected needle examination of bilateral lower extremity muscles and bilateral lumbosacral paraspinal muscles were normal.   Conclusion: This is a normal study.  There is no electrodiagnostic evidence of large fiber peripheral neuropathy or bilateral lumbosacral radiculopathy.  External EMG at [facility] (04/03/23):    ASSESSMENT: Derrick [name] is a 67 y.o. male who presents for evaluation of pain in bilateral feet. He has a relevant medical history of CAD s/p PCI and CABG, carotid stenosis, PVD c/b claudication s/p right common femoral to below knee popliteal bypass and left common femoral to PT bypass (2016), HTN, HLD, CKD, prostate cancer, OA, cervical spine disease s/p 3 neck surgeries (C3-C7 fusion), lumbar spine disease s/p lumbar surgery, myofascial pain s/p trigger point injections. His neurological examination is pertinent for length dependent sensory loss and hyporeflexia. Available diagnostic data is significant for EMG showing chronic radiculopathy and large fiber sensorimotor neuropathy. Patient's symptoms are likely multifactorial with contributions from spine disease, arthritis, PVD, and a distal symmetric polyneuropathy. I will get labs to look for treatable causes.  PLAN: -Blood work: HbA1c, MMA, folate, IFE -Increase gabapentin  to 600 mg at bedtime -Lidocaine  cream PRN -Alpha lipoic acid 600 mg once or twice daily  -Return to clinic in 6 months  The impression above as well as the plan as outlined below were extensively discussed with the patient (in the company of wife) who voiced understanding. All questions were answered to their satisfaction.  The patient was counseled on pertinent fall precautions per the printed material provided today, and as noted under  the Patient Instructions section below.  When available, results of the above investigations and possible further recommendations will be communicated to the patient via telephone/MyChart. Patient to call office if not contacted after expected testing turnaround time.   Total time spent reviewing records, interview, history/exam, documentation, and coordination of care on day of encounter:  65 min   Thank you for allowing me to participate in patient's care.  If I can answer any additional questions, I would be pleased to do so.  Venetia Potters, MD   CC: Marvene Prentice [name], [name] [phone] [address] [place] [address]  CC: Referring provider: Candance Jeoffrey SAILOR, PA-C [address], [address] [place],  [place] [address]

## 2023-05-31 HISTORY — PX: OTHER SURGICAL HISTORY: SHX169

## 2023-06-01 ENCOUNTER — Encounter: Payer: Self-pay | Admitting: Neurology

## 2023-06-01 ENCOUNTER — Ambulatory Visit: Payer: Medicare Other | Admitting: Neurology

## 2023-06-01 ENCOUNTER — Other Ambulatory Visit: Payer: Medicare Other

## 2023-06-01 VITALS — BP 153/67 | HR 60 | Ht 74.0 in | Wt 242.0 lb

## 2023-06-01 DIAGNOSIS — M1990 Unspecified osteoarthritis, unspecified site: Secondary | ICD-10-CM | POA: Diagnosis not present

## 2023-06-01 DIAGNOSIS — G629 Polyneuropathy, unspecified: Secondary | ICD-10-CM

## 2023-06-01 DIAGNOSIS — I739 Peripheral vascular disease, unspecified: Secondary | ICD-10-CM | POA: Diagnosis not present

## 2023-06-01 DIAGNOSIS — Z981 Arthrodesis status: Secondary | ICD-10-CM

## 2023-06-01 DIAGNOSIS — M47812 Spondylosis without myelopathy or radiculopathy, cervical region: Secondary | ICD-10-CM

## 2023-06-01 DIAGNOSIS — Z131 Encounter for screening for diabetes mellitus: Secondary | ICD-10-CM

## 2023-06-01 DIAGNOSIS — M792 Neuralgia and neuritis, unspecified: Secondary | ICD-10-CM

## 2023-06-01 MED ORDER — GABAPENTIN 300 MG PO CAPS: 600.0000 mg | ORAL_CAPSULE | Freq: Every day | ORAL | 5 refills | Status: DC

## 2023-06-01 NOTE — Patient Instructions (Addendum)
 I saw you today for pain in your legs. This is likely a combination of nerve damage (peripheral neuropathy), arthritis, peripheral vascular disease, and your prior back surgeries.  I will get blood work today to look for other causes.  For your symptoms, increase gabapentin  to 600 mg at bedtime. I sent a new prescription to your pharmacy.  You can also try Lidocaine  cream as needed. Apply wear you have pain, tingling, or burning. Wear gloves to prevent your hands being numb. This can be bought over the counter at any drug store or online.  Alpha lipoic acid 600mg  daily has some research data suggesting it helps with nerve health. No major side effects other than <1% of people report upset stomach. This can be taken twice per day (1200mg  daily) if no relief obtained. You can buy this over the counter or online.  I will be in touch when I have your lab results.  Please let me know if you have any questions or concerns in the meantime.  I will see you back in clinic in 6 months or sooner if needed.  The physicians and staff at [facility] are committed to providing excellent care. You may receive a survey requesting feedback about your experience at our office. We strive to receive very good responses to the survey questions. If you feel that your experience would prevent you from giving the office a very good  response, please contact our office to try to remedy the situation. We may be reached at [phone]. Thank you for taking the time out of your busy day to complete the survey.  Venetia Potters, MD [facility]  Preventing Falls at [facility] are common, often dreaded events in the lives of older people. Aside from the obvious injuries and even death that may result, fall can cause wide-ranging consequences including loss of independence, mental decline, decreased activity and mobility. Younger people are also at risk of falling, especially those with chronic illnesses and  fatigue.  Ways to reduce risk for falling Examine diet and medications. Warm foods and alcohol dilate blood vessels, which can lead to dizziness when standing. Sleep aids, antidepressants and pain medications can also increase the likelihood of a fall.  Get a vision exam. Poor vision, cataracts and glaucoma increase the chances of falling.  Check foot gear. Shoes should fit snugly and have a sturdy, nonskid sole and a broad, low heel  Participate in a physician-approved exercise program to build and maintain muscle strength and improve balance and coordination. Programs that use ankle weights or stretch bands are excellent for muscle-strengthening. Water aerobics programs and low-impact Tai Chi programs have also been shown to improve balance and coordination.  Increase vitamin D intake. Vitamin D improves muscle strength and increases the amount of calcium  the body is able to absorb and deposit in bones.  How to prevent falls from common hazards Floors - Remove all loose wires, cords, and throw rugs. Minimize clutter. Make sure rugs are anchored and smooth. Keep furniture in its usual place.  Chairs -- Use chairs with straight backs, armrests and firm seats. Add firm cushions to existing pieces to add height.  Bathroom - Install grab bars and non-skid tape in the tub or shower. Use a bathtub transfer bench or a shower chair with a back support Use an elevated toilet seat and/or safety rails to assist standing from a low surface. Do not use towel racks or bathroom tissue holders to help you stand.  Lighting -  Make sure halls, stairways, and entrances are well-lit. Install a night light in your bathroom or hallway. Make sure there is a light switch at the top and bottom of the staircase. Turn lights on if you get up in the middle of the night. Make sure lamps or light switches are within reach of the bed if you have to get up during the night.  Kitchen - Install non-skid rubber mats near the  sink and stove. Clean spills immediately. Store frequently used utensils, pots, pans between waist and eye level. This helps prevent reaching and bending. Sit when getting things out of lower cupboards.  Living room/ Bedrooms - Place furniture with wide spaces in between, giving enough room to move around. Establish a route through the living room that gives you something to hold onto as you walk.  Stairs - Make sure treads, rails, and rugs are secure. Install a rail on both sides of the stairs. If stairs are a threat, it might be helpful to arrange most of your activities on the lower level to reduce the number of times you must climb the stairs.  Entrances and doorways - Install metal handles on the walls adjacent to the doorknobs of all doors to make it more secure as you travel through the doorway.  Tips for maintaining balance Keep at least one hand free at all times. Try using a backpack or fanny pack to hold things rather than carrying them in your hands. Never carry objects in both hands when walking as this interferes with keeping your balance.  Attempt to swing both arms from front to back while walking. This might require a conscious effort if Parkinson's disease has diminished your movement. It will, however, help you to maintain balance and posture, and reduce fatigue.  Consciously lift your feet off of the ground when walking. Shuffling and dragging of the feet is a common culprit in losing your balance.  When trying to navigate turns, use a U technique of facing forward and making a wide turn, rather than pivoting sharply.  Try to stand with your feet shoulder-length apart. When your feet are close together for any length of time, you increase your risk of losing your balance and falling.  Do one thing at a time. Don't try to walk and accomplish another task, such as reading or looking around. The decrease in your automatic reflexes complicates motor function, so the less  distraction, the better.  Do not wear rubber or gripping soled shoes, they might catch on the floor and cause tripping.  Move slowly when changing positions. Use deliberate, concentrated movements and, if needed, use a grab bar or walking aid. Count 15 seconds between each movement. For example, when rising from a seated position, wait 15 seconds after standing to begin walking.  If balance is a continuous problem, you might want to consider a walking aid such as a cane, walking stick, or walker. Once you've mastered walking with help, you might be ready to try it on your own again.

## 2023-06-05 ENCOUNTER — Encounter: Payer: Self-pay | Admitting: Neurology

## 2023-06-05 LAB — HEMOGLOBIN A1C
Hgb A1c MFr Bld: 6 %{Hb} — ABNORMAL HIGH (ref <5.7)
Mean Plasma Glucose: 126 mg/dL
eAG (mmol/L): 7 mmol/L

## 2023-06-05 LAB — METHYLMALONIC ACID, SERUM: Methylmalonic Acid, Quant: 99 nmol/L (ref 69–390)

## 2023-06-05 LAB — FOLATE: Folate: 12.4 ng/mL

## 2023-06-05 LAB — IMMUNOFIXATION ELECTROPHORESIS
IgM, Serum: 1055 mg/dL (ref 600–300)
IgM, Serum: 137 mg/dL (ref 50–300)
Immunoglobulin A: 1055 mg/dL (ref 70–320)
Immunoglobulin A: 164 mg/dL (ref 70–320)

## 2023-06-08 ENCOUNTER — Telehealth: Payer: Self-pay

## 2023-06-08 NOTE — Telephone Encounter (Signed)
 Auth Submission: NO AUTH NEEDED Site of care: Site of care: [facility] Payer: medicare a/b & humana supp Medication & CPT/J Code(s) submitted: Leqvio  (Inclisiran) J1306 Route of submission (phone, fax, portal):  Phone # Fax # Auth type: Buy/Bill Units/visits requested: 284mg  x 2 doses Reference number:  Approval from: 11/02/22 to 06/29/24

## 2023-06-10 ENCOUNTER — Encounter: Payer: Self-pay | Admitting: Cardiovascular Disease

## 2023-06-10 NOTE — Progress Notes (Signed)
 Derrick [name] Date of Birth  1956/04/21 [facility] [address]     [address] [place], [place]  [address] [phone]  Fax  [phone]  Problem List: 1. CAD, status post stents to the LAD,  left circumflex artery 2006 . His chronically occluded right coronary artery S/p CABG ( bilateral IMA )  2. hyperlipidemia-intolerant to statins 3. Hypertension 4. Peripheral Vascular disease    68 yo male with history of CAD - s/p stents to LAD and LCx.  Chronically occluded RCA.  No angina.  He eats a very fatty diet - lots of fried foods.  Not exercising.  He does lots of yard work and has never had any episodes of angina.  Oct 01, 2013:  Derrick [name] was seen by Avanell Bob in Oct. 2014 for pre-op eval prior to rotator cuff surgery.    His myoview  study showed:  Low risk stress nuclear study with a large, severe intensity, partially reversible inferior defect consistent with prior inferior infarct and very mild peri-infarct ischemia.  LV Ejection Fraction: 54%. LV Wall Motion: Inferior hypokinesis.  He has shoulder surgery without difficulty.  He's doing well. He denies any chest pain or shortness of breath.  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     Feb. 12 2018:    Has done well.  Has hx of PVD - Stacia Dynes)   Oct. 1 2018;    Derrick [name] has been having some DOE , chest pain with exertion Multiple occasions over the past month,,   Gradually getting worse   Occurred while he was pruning and cleaning up his yard.  Is exercising more.  Works 11 hours a day . 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  Symptoms feel very similar to his previous symptoms in 2006  May 05, 2017:  Derrick [name] is doing well.  He had a cath in October that showed significant left main disease as well as other narrowings.  He had coronary artery bypass grafting.  He is done well since that time.  He had some postoperative atrial fibrillation.  He is on Cardizem  120 mg a day  as well as metoprolol  25 mg twice a day.  He was not started on anticoagulation.  August 31, 2017: Derrick [name] is seen today for follow-up visit.  He has a history of coronary artery bypass grafting with postoperative atrial fibrillation.  History of hypertension and hyperlipidemia.  Has gained some weight.  Walks some  No CP ,   Lipids are elevatee.  intolerent to statins   Sept. 26, 2019: Doing well since his CABG in Oct. 2018 Was noted to have very minimal carotid artery disease at that time. Walks some .   9000-10000 steps a day  Still works  Oncologist )   Is in the Clear trial .     July 22, 2019  Hx of CABG in 2018.  No further episode so aF recently  Still at [employer] .   Going to retire.soon .    Got his shingles shot Advised him to delay his covid shot for several weeks.   Feb, 18, 2022:  Derrick [name] is seen today for follow up of his CAD /CABG. BP is mildly elevated.  Goes out to eat every morning at [employer] from primary MD are reviewed.  Trigs are > 500, LDL is 3  November 09, 2021 Derrick [name] is seen for follow up of his CAD / CABG  Multiple complaints Not sleeping well  Amlodipine  has been increased Hydralazine  was added, then stopped  HCTZ was restarted   Likely eats more salt than he should  Rides stationary bike, does yard work , Sales executive once a week .   No CP , no dyspnea   Does not eat fresh veggies ( wife uses canned )   Lots of stiffness  June 23, 2022:  BP is better  Is watching his salt intake  Leg swelling is better  HCTZ was changed to lasix   On Coreg .   Jan. 15, 2025 Derrick [name] is seen for follow up visit for his CAD / CABG  BP readings are good home  A bit elevated at home  Getting exercise  Stationary bike ,   No CP   Lipids from primary  Chol = 132 HDL = 4 LDL = 56 Trigs = 195    Current Outpatient Medications  Medication Sig Dispense Refill   acetaminophen  (TYLENOL ) 650 MG CR tablet Take 1,300 mg by mouth in the  morning and at bedtime.     albuterol (VENTOLIN HFA) 108 (90 Base) MCG/ACT inhaler Inhale 2 puffs into the lungs every 4 (four) hours as needed for wheezing or shortness of breath.     aspirin  81 MG EC tablet Take 81 mg by mouth in the morning.     calcitRIOL  (ROCALTROL ) 0.5 MCG capsule Take 0.5 mcg by mouth in the morning.     Calcium  Carb-Cholecalciferol (CALCIUM  600+D3 PO) Take 1 tablet by mouth in the morning and at bedtime.     carvedilol  (COREG ) 12.5 MG tablet TAKE 1 TABLET BY MOUTH TWICE A DAY 180 tablet 3   Cyanocobalamin 2500 MCG TABS Take 2,500 mcg by mouth in the morning.     ezetimibe  (ZETIA ) 10 MG tablet Take 10 mg by mouth every evening.     famotidine  (PEPCID ) 20 MG tablet Take 20 mg by mouth 2 (two) times daily.     fexofenadine (ALLEGRA ODT) 30 MG disintegrating tablet Take 30 mg by mouth daily.     fish oil-omega-3 fatty acids  1000 MG capsule Take 1 capsule (1,000 mg total) by mouth in the morning and at bedtime. 60 capsule 0   furosemide  (LASIX ) 20 MG tablet TAKE 1 TABLET BY MOUTH DAILY EXCEPT MONDAYS AND THURSDAYS TAKE 2 TABLETS BY MOUTH DAILY 98 tablet 3   gabapentin  (NEURONTIN ) 300 MG capsule Take 2 capsules (600 mg total) by mouth at bedtime. 60 capsule 5   ipratropium (ATROVENT HFA) 17 MCG/ACT inhaler Inhale 2 puffs into the lungs every 6 (six) hours as needed for wheezing.     ipratropium (ATROVENT) 0.03 % nasal spray Place 1 spray into both nostrils 2 (two) times daily as needed for rhinitis.     losartan  (COZAAR ) 100 MG tablet Take 100 mg by mouth every evening.      Magnesium  250 MG TABS Take 250 mg by mouth every evening.     Menthol , Topical Analgesic, (BIOFREEZE EX) Apply 1 Application topically 3 (three) times daily as needed (back pain/soreness).     mupirocin  ointment (BACTROBAN ) 2 % Apply topically.     nitroGLYCERIN  (NITROSTAT ) 0.4 MG SL tablet Place 1 tablet (0.4 mg total) under the tongue every 5 (five) minutes as needed for chest pain. 25 tablet 3    potassium chloride  (KLOR-CON ) 10 MEQ tablet TAKE 1 TABLET EVERY DAY 90 tablet 2   pravastatin  (PRAVACHOL ) 20 MG tablet Take 1 tablet (20 mg total) by mouth every evening.  90 tablet 3   Propylene Glycol (SYSTANE BALANCE) 0.6 % SOLN Place 1 drop into both eyes in the morning and at bedtime.     tamsulosin  (FLOMAX ) 0.4 MG CAPS capsule Take 0.4 mg by mouth 2 (two) times daily.     TART CHERRY PO Take 3,000 mg by mouth every evening.     traZODone  (DESYREL ) 50 MG tablet Take 100 mg by mouth at bedtime.     amLODipine  (NORVASC ) 2.5 MG tablet Take 1 tablet (2.5 mg total) by mouth daily. 90 tablet 3   Current Facility-Administered Medications  Medication Dose Route Frequency Provider Last Rate Last Admin   inclisiran (LEQVIO ) injection 284 mg  284 mg Subcutaneous Once Javeon Macmurray, Lela Purple, MD         Allergies  Allergen Reactions   Codeine Itching and Other (See Comments)   Atorvastatin Other (See Comments)    Muscle aches Muscle aches   Crestor [Rosuvastatin Calcium ]     Muscle aches   Meloxicam Other (See Comments)    Can not take due to kidneys   Statins Other (See Comments)    myalgia    Past Medical History:  Diagnosis Date   Chronic kidney disease    "mild"-from taking meloxicam for many years.   Complication of anesthesia    "I was slow to wake up"   Coronary artery disease 2006   stents x2=LAD LCX; totally occluded RCA b. CABG 03/02/17 x2 (left internal mammary artery to LAD, Y-graft right mammary from left mammary to OM).// Myoview  9/22: Inferior infarct, no ischemia, EF 61; low risk // Echocardiogram 9/22: EF 55-60, no RWMA, GLS -24.5%, normal RVSF, RVSP 30.7, mild LAE, trivial MR, trivial AI, mild AV sclerosis w/o AS   Dyslipidemia    GERD (gastroesophageal reflux disease)    History of bronchitis    Hyperlipidemia    statin intolerant   Hyperparathyroidism    Hypertension    Insomnia    Metatarsalgia    Neuropathy    OA (osteoarthritis)    Peripheral arterial disease  (HCC)    Peripheral vascular disease (HCC)    Seasonal allergies    Urinary frequency    Wears glasses     Past Surgical History:  Procedure Laterality Date   ABDOMINAL AORTOGRAM W/LOWER EXTREMITY N/A 11/01/2022   Procedure: ABDOMINAL AORTOGRAM W/LOWER EXTREMITY;  Surgeon: Margherita Shell, MD;  Location: [facility];  Service: Cardiovascular;  Laterality: N/A;   ABDOMINAL EXPOSURE N/A 03/31/2021   Procedure: ABDOMINAL EXPOSURE;  Surgeon: Young Hensen, MD;  Location[facility] OR;  Service: Vascular;  Laterality: N/A;   ANTERIOR LUMBAR FUSION N/A 03/31/2021   Procedure: ANTERIOR LUMBAR FUSION 1 LEVEL LUMBAR FIVE TO SACRAL ONE;  Surgeon: Mort Ards, MD;  Location: [facility] OR;  Service: Orthopedics;  Laterality: N/A;  3 C-BED  LEFT TAP BLOCK WITH EXPAREL    CARDIAC CATHETERIZATION  10/28/2004   circ and mid LAD chyper stents   CERVICAL FUSION  2007,2009   CERVICAL SPINE SURGERY  2004   COLONOSCOPY     CORONARY ANGIOPLASTY  2006   stents placed    CORONARY ARTERY BYPASS GRAFT N/A 03/02/2017   Procedure: CORONARY ARTERY BYPASS GRAFTING (CABG), ON PUMP, TIMES TWO, USING BILATERAL MAMMARY ARTERIES;  Surgeon: Zelphia Higashi, MD;  Location: [facility] OR;  Service: Open Heart Surgery;  Laterality: N/A;   ELBOW ARTHROPLASTY  1994   right   ENDARTERECTOMY FEMORAL Left 03/17/2015   Procedure: ENDARTERECTOMY COMMON FEMORAL WITH PROFUNDOPLASTY;  Surgeon: Richrd Char, MD;  Location: [facility] OR;  Service: Vascular;  Laterality: Left;   FEMORAL-POPLITEAL BYPASS GRAFT Left 03/17/2015   Procedure: BYPASS GRAFT FEMORAL-POPLITEAL ARTERY-LEFT LEG AND POSTERIOR TIBIAL SEQUENTIAL GRAFT TO BELOW KNEE POPLITEAL ARTERY;  Surgeon: Richrd Char, MD;  Location: [facility] OR;  Service: Vascular;  Laterality: Left;   FEMORAL-POPLITEAL BYPASS GRAFT Right 05/11/2015   Procedure:  RIGHT FEMORAL-BELOW THE KNEE POPLITEAL ARTERY BYPASS GRAFT USING NON REVERSE RIGHT GREATER SAPHENOUS VEIN;  Surgeon: Richrd Char, MD;   Location: [facility] OR;  Service: Vascular;  Laterality: Right;   INCISION AND DRAINAGE ABSCESS POSTERIOR CERVICALSPINE  2009   INTRAOPERATIVE ARTERIOGRAM Left 03/17/2015   Procedure: INTRA OPERATIVE ARTERIOGRAM X2;  Surgeon: Richrd Char, MD;  Location: [facility] OR;  Service: Vascular;  Laterality: Left;   INTRAOPERATIVE ARTERIOGRAM Right 05/11/2015   Procedure: INTRA OPERATIVE ARTERIOGRAM;  Surgeon: Richrd Char, MD;  Location: [facility] OR;  Service: Vascular;  Laterality: Right;   LEFT HEART CATH AND CORONARY ANGIOGRAPHY N/A 03/01/2017   Procedure: LEFT HEART CATH AND CORONARY ANGIOGRAPHY;  Surgeon: Odie Benne, MD;  [facility]: [facility];  Service: Cardiovascular;  Laterality: N/A;   PATCH ANGIOPLASTY Left 03/17/2015   Procedure: VEIN PATCH ANGIOPLASTY COMMON FEMORAL ARTERY;  Surgeon: Richrd Char, MD;  Location: [facility] OR;  Service: Vascular;  Laterality: Left;   PERIPHERAL VASCULAR CATHETERIZATION N/A 01/16/2015   Procedure: Abdominal Aortogram;  Surgeon: Richrd Char, MD;  Location: [facility] INVASIVE CV LAB;  Service: Cardiovascular;  Laterality: N/A;   SHOULDER OPEN ROTATOR CUFF REPAIR Right 04/23/2013   Procedure: RIGHT TWO TENDON ROTATOR CUFF REPAIR  SUBACROMIAL DECOMPRESSION AND OPEN DISTAL CLAVICLE RESECTION;  Surgeon: Amelie Baize., MD;  Location: [facility];  Service: Orthopedics;  Laterality: Right;   stents placed   2006   TEE WITHOUT CARDIOVERSION N/A 03/02/2017   Procedure: TRANSESOPHAGEAL ECHOCARDIOGRAM (TEE);  Surgeon: Zelphia Higashi, MD;  Location: [facility] OR;  Service: Open Heart Surgery;  Laterality: N/A;   VEIN HARVEST Right 05/11/2015   Procedure: WITH NON REVERSE RIGHT GREATER SAPHENOUS VEIN HARVEST;  Surgeon: Richrd Char, MD;  Location: [facility] OR;  Service: Vascular;  Laterality: Right;    Social History   Tobacco Use  Smoking Status Former   Current packs/day: 0.00   Types: Cigarettes   Quit date: 05/30/1989   Years since quitting: 34.0    Passive exposure: Never  Smokeless Tobacco Never    Social History   Substance and Sexual Activity  Alcohol Use Yes   Comment: 1 beer every 6 months    Family History  Problem Relation Age of Onset   Arthritis Mother    Varicose Veins Mother    Lung cancer Father    COPD Father        Lung   Cancer Brother        Lukemia   COPD Brother    Cancer Brother        Prostate    Reviw of Systems:  Reviewed in the HPI.  All other systems are negative.   Physical Exam: Blood pressure 139/65, pulse 62, height 6\' 2"  (1.88 m), weight 244 lb (110.7 kg), SpO2 97%.       GEN:  Well nourished, well developed in no acute distress HEENT: Normal NECK: No JVD; No carotid bruits LYMPHATICS: No lymphadenopathy CARDIAC: RRR , no murmurs, rubs, gallops RESPIRATORY:  Clear to auscultation without rales, wheezing or rhonchi  ABDOMEN: Soft,  non-tender, non-distended MUSCULOSKELETAL:  No edema; No deformity  SKIN: Warm and dry NEUROLOGIC:  Alert and oriented x 3    ECG:    Assessment / Plan:    1. CAD -       he is doing well.  He status post coronary artery bypass grafting.  He is not having any angina.   2.   Hyperlipidemia :    His lipids look good.  His triglyceride level is 195.  We discussed limiting his carbohydrates and adding some cardio exercise.     3. HTN: Blood pressure is well-controlled at home.  Slightly elevated here initially but then came down nicely.   3.   Carotid artery disease:   followed by [facility]     Ahmad Alert, MD  06/14/2023 9:31 AM    [facility] [address] [place],  [address] [place], [place]  [address] Pager [phone] Phone: [phone]; Fax: [phone]

## 2023-06-14 ENCOUNTER — Encounter: Payer: Self-pay | Admitting: Cardiovascular Disease

## 2023-06-14 ENCOUNTER — Ambulatory Visit: Payer: Medicare Other | Attending: Cardiovascular Disease | Admitting: Cardiovascular Disease

## 2023-06-14 VITALS — BP 139/65 | HR 62 | Ht 74.0 in | Wt 244.0 lb

## 2023-06-14 DIAGNOSIS — I25810 Atherosclerosis of coronary artery bypass graft(s) without angina pectoris: Secondary | ICD-10-CM | POA: Diagnosis present

## 2023-06-14 DIAGNOSIS — I10 Essential (primary) hypertension: Secondary | ICD-10-CM | POA: Diagnosis not present

## 2023-06-14 NOTE — Patient Instructions (Addendum)
 Follow-Up: At [facility], you and your health needs are our priority.  As part of our continuing mission to provide you with exceptional heart care, we have created designated Provider Care Teams.  These Care Teams include your primary Cardiologist (physician) and Advanced Practice Providers (APPs -  Physician Assistants and Nurse Practitioners) who all work together to provide you with the care you need, when you need it.  We recommend signing up for the patient portal called "MyChart".  Sign up information is provided on this After Visit Summary.  MyChart is used to connect with patients for Virtual Visits (Telemedicine).  Patients are able to view lab/test results, encounter notes, upcoming appointments, etc.  Non-urgent messages can be sent to your provider as well.   To learn more about what you can do with MyChart, go to [email or web address].    Your next appointment:   1 year(s)  Provider:   Randene Bustard, MD    1st Floor: - Lobby - Registration  - Pharmacy  - Lab - Cafe  2nd Floor: - PV Lab - Diagnostic Testing (echo, CT, nuclear med)  3rd Floor: - Vacant  4th Floor: - TCTS (cardiothoracic surgery) - AFib Clinic - Structural Heart Clinic - Vascular Surgery  - Vascular Ultrasound  5th Floor: - HeartCare Cardiology (general and EP) - Clinical Pharmacy for coumadin, hypertension, lipid, weight-loss medications, and med management appointments    Valet parking services will be available as well.

## 2023-07-01 HISTORY — PX: OTHER SURGICAL HISTORY: SHX169

## 2023-07-19 ENCOUNTER — Other Ambulatory Visit: Payer: Self-pay | Admitting: Cardiovascular Disease

## 2023-09-25 ENCOUNTER — Telehealth: Payer: Self-pay | Admitting: Cardiology

## 2023-09-25 NOTE — Telephone Encounter (Signed)
 Spoke with pt, he reports Tuesday last week while playing golf he got weak and felt faint. Since then he has noticed his blood pressure has been running low. The only change is he was started on a muscle relaxer by his medical doctor. He is drinking plenty of water. Medications confirmed. He usually takes his medications in the morning and then takes his blood pressure at night. This morning his bp was 140/73 and at his PCP office this morning it was 125/62. Aware will make dr Alroy Aspen aware for any medication changes.

## 2023-09-25 NOTE — Telephone Encounter (Signed)
 Pt c/o BP issue: STAT if pt c/o blurred vision, one-sided weakness or slurred speech.  STAT if BP is GREATER than 180/120 TODAY.  STAT if BP is LESS than 90/60 and SYMPTOMATIC TODAY  1. What is your BP concern? 140/something" This morning.   2. Have you taken any BP medication today? Yes  3. What are your last 5 BP readings?Last Week It was 97/50 and 84/53  4. Are you having any other symptoms (ex. Dizziness, headache, blurred vision, passed out)? Dizziness, Fainty, and lightheaded  Patient stated last week he was feeling very lightheaded and fainty with low blood pressure.Patient mentioned seeing spots like he was going to pass out, but did not. Patient stated today his bp has been good. Patient was outside when speaking to him and did not have his list of BP readings with him. Please advise.

## 2023-10-26 NOTE — Progress Notes (Unsigned)
 NEUROLOGY FOLLOW UP OFFICE NOTE  Derrick [name] [id number]  Subjective:  Derrick [name] is a 68 y.o. year old left-handed male with a medical history of CAD s/p PCI and CABG, carotid stenosis, PVD c/b claudication s/p right common femoral to below knee popliteal bypass and left common femoral to PT bypass (2016), HTN, HLD, CKD, prostate cancer, OA, cervical spine disease s/p 3 neck surgeries (C3-C7 fusion), lumbar spine disease s/p lumbar surgery, myofascial pain s/p trigger point injections who we last saw on 06/01/23 for pain in bilateral feet.  To briefly review: Patient's symptoms have been present for at least 6 years. 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 Over the years, things have progressively gotten worse. He felt is due to all of the standing and wearing boots at work. He feels his legs are getting weak now. He has swelling of his left leg. He notices symptoms on both sides, but the left is worse than the right. He also endorses cramps. He has imbalance, but has not had any falls in the last few years. He endorses arthritis in his hands but no numbness, tingling, or burning in the hands.   He also has left hip pain that is is not sure if it is related or not. He has a history of back pain. He had lumbar spine surgery in 2022. He has also had bypass surgery in both legs for PVD.   Per [facility] note from 04/11/23 patient had an EMG that showed chronic L4-S1 radic but also PN.   Patient has seen Dr. Gracie Lav in the past (last 04/29/2020) for neuropathy. EMG of bilateral lower extremities on 04/29/20 was normal with no evidence of neuropathy or radiculopathy.   Patient takes gabapentin  300 mg at bedtime. He has been on this for 3-4 years. He is not sure if this is helping. He has done injections in the spine previously as well, which helped at first, but not for long. He also takes B12 2500 mcg daily.   He takes aspirin  81 mg daily for  vascular disease.   He was recently diagnosed with prostate cancer but has not had any chemotherapy or radiation.   The patient does not report symptoms referable to autonomic dysfunction including impaired sweating, heat or cold intolerance, excessive mucosal dryness, gastroparetic early satiety, postprandial abdominal bloating, constipation, bowel or bladder dyscontrol, or syncope/presyncope/orthostatic intolerance.   He report any constitutional symptoms like fever, night sweats, anorexia or unintentional weight loss.   EtOH use: 1 beer every 6 months.   Restrictive diet? No Family history of neuropathy/myopathy/neuropathy disease? Mother had hammertoes and had to have a toe amputated  Most recent Assessment and Plan (06/01/23): Derrick [name] is a 68 y.o. male who presents for evaluation of pain in bilateral feet. He has a relevant medical history of CAD s/p PCI and CABG, carotid stenosis, PVD c/b claudication s/p right common femoral to below knee popliteal bypass and left common femoral to PT bypass (2016), HTN, HLD, CKD, prostate cancer, OA, cervical spine disease s/p 3 neck surgeries (C3-C7 fusion), lumbar spine disease s/p lumbar surgery, myofascial pain s/p trigger point injections. His neurological examination is pertinent for length dependent sensory loss and hyporeflexia. Available diagnostic data is significant for EMG showing chronic radiculopathy and large fiber sensorimotor neuropathy. Patient's symptoms are likely multifactorial with contributions from spine disease, arthritis, PVD, and a distal symmetric polyneuropathy. I will get labs to look for treatable  causes.   PLAN: -Blood work: HbA1c, MMA, folate, IFE -Increase gabapentin  to 600 mg at bedtime -Lidocaine  cream PRN -Alpha lipoic acid 600 mg once or twice daily  Since their last visit: Labs showed HbA1c of 6.0 and indeterminate IFE.  He tried gabapentin  600 mg but he was not sleeping as well. He cut back to 300 mg at  bedtime and this is working better for him in terms of sleeping. He is taking alpha lipoic acid. He thinks the pain may be a little better. He finds it manageable currently. He mentions he does have cramps at night occasionally.  He has no new complaints. He denies any recent falls  MEDICATIONS:  Outpatient Encounter Medications as of 10/27/2023  Medication Sig   acetaminophen  (TYLENOL ) 650 MG CR tablet Take 1,300 mg by mouth in the morning and at bedtime.   albuterol (VENTOLIN HFA) 108 (90 Base) MCG/ACT inhaler Inhale 2 puffs into the lungs every 4 (four) hours as needed for wheezing or shortness of breath.   amLODipine  (NORVASC ) 2.5 MG tablet Take 1 tablet (2.5 mg total) by mouth daily.   aspirin  81 MG EC tablet Take 81 mg by mouth in the morning.   calcitRIOL  (ROCALTROL ) 0.5 MCG capsule Take 0.5 mcg by mouth in the morning.   Calcium  Carb-Cholecalciferol (CALCIUM  600+D3 PO) Take 1 tablet by mouth in the morning and at bedtime.   carvedilol  (COREG ) 12.5 MG tablet TAKE 1 TABLET TWICE A DAY   Cyanocobalamin 2500 MCG TABS Take 2,500 mcg by mouth in the morning.   ezetimibe  (ZETIA ) 10 MG tablet Take 10 mg by mouth every evening.   famotidine  (PEPCID ) 20 MG tablet Take 20 mg by mouth 2 (two) times daily.   fexofenadine (ALLEGRA ODT) 30 MG disintegrating tablet Take 30 mg by mouth daily.   fish oil-omega-3 fatty acids  1000 MG capsule Take 1 capsule (1,000 mg total) by mouth in the morning and at bedtime.   furosemide  (LASIX ) 20 MG tablet TAKE 1 TABLET BY MOUTH DAILY EXCEPT MONDAYS AND THURSDAYS TAKE 2 TABLETS BY MOUTH DAILY   gabapentin  (NEURONTIN ) 300 MG capsule Take 2 capsules (600 mg total) by mouth at bedtime.   ipratropium (ATROVENT HFA) 17 MCG/ACT inhaler Inhale 2 puffs into the lungs every 6 (six) hours as needed for wheezing.   ipratropium (ATROVENT) 0.03 % nasal spray Place 1 spray into both nostrils 2 (two) times daily as needed for rhinitis.   losartan  (COZAAR ) 100 MG tablet Take 100 mg  by mouth every evening.    Magnesium  250 MG TABS Take 250 mg by mouth every evening.   Menthol , Topical Analgesic, (BIOFREEZE EX) Apply 1 Application topically 3 (three) times daily as needed (back pain/soreness).   mupirocin  ointment (BACTROBAN ) 2 % Apply topically.   nitroGLYCERIN  (NITROSTAT ) 0.4 MG SL tablet Place 1 tablet (0.4 mg total) under the tongue every 5 (five) minutes as needed for chest pain.   potassium chloride  (KLOR-CON ) 10 MEQ tablet TAKE 1 TABLET EVERY DAY   pravastatin  (PRAVACHOL ) 20 MG tablet TAKE 1 TABLET EVERY EVENING   Propylene Glycol (SYSTANE BALANCE) 0.6 % SOLN Place 1 drop into both eyes in the morning and at bedtime.   tamsulosin  (FLOMAX ) 0.4 MG CAPS capsule Take 0.4 mg by mouth 2 (two) times daily.   TART CHERRY PO Take 3,000 mg by mouth every evening.   traZODone  (DESYREL ) 50 MG tablet Take 100 mg by mouth at bedtime.   Facility-Administered Encounter Medications as of 10/27/2023  Medication  inclisiran (LEQVIO ) injection 284 mg    PAST MEDICAL HISTORY: Past Medical History:  Diagnosis Date   Chronic kidney disease    "mild"-from taking meloxicam for many years.   Complication of anesthesia    "I was slow to wake up"   Coronary artery disease 2006   stents x2=LAD LCX; totally occluded RCA b. CABG 03/02/17 x2 (left internal mammary artery to LAD, Y-graft right mammary from left mammary to OM).// Myoview  9/22: Inferior infarct, no ischemia, EF 61; low risk // Echocardiogram 9/22: EF 55-60, no RWMA, GLS -24.5%, normal RVSF, RVSP 30.7, mild LAE, trivial MR, trivial AI, mild AV sclerosis w/o AS   Dyslipidemia    GERD (gastroesophageal reflux disease)    History of bronchitis    Hyperlipidemia    statin intolerant   Hyperparathyroidism    Hypertension    Insomnia    Metatarsalgia    Neuropathy    OA (osteoarthritis)    Peripheral arterial disease (HCC)    Peripheral vascular disease (HCC)    Seasonal allergies    Urinary frequency    Wears glasses      PAST SURGICAL HISTORY: Past Surgical History:  Procedure Laterality Date   ABDOMINAL AORTOGRAM W/LOWER EXTREMITY N/A 11/01/2022   Procedure: ABDOMINAL AORTOGRAM W/LOWER EXTREMITY;  Surgeon: Margherita Shell, MD;  Location: [facility];  Service: Cardiovascular;  Laterality: N/A;   ABDOMINAL EXPOSURE N/A 03/31/2021   Procedure: ABDOMINAL EXPOSURE;  Surgeon: Young Hensen, MD;  [facility] OR;  Service: Vascular;  Laterality: N/A;   ANTERIOR LUMBAR FUSION N/A 03/31/2021   Procedure: ANTERIOR LUMBAR FUSION 1 LEVEL LUMBAR FIVE TO SACRAL ONE;  Surgeon: Mort Ards, MD;  Location: [facility] OR;  Service: Orthopedics;  Laterality: N/A;  3 C-BED  LEFT TAP BLOCK WITH EXPAREL    CARDIAC CATHETERIZATION  10/28/2004   circ and mid LAD chyper stents   CERVICAL FUSION  2007,2009   CERVICAL SPINE SURGERY  05/30/2002   COLONOSCOPY     CORONARY ANGIOPLASTY  05/30/2004   stents placed    CORONARY ARTERY BYPASS GRAFT N/A 03/02/2017   Procedure: CORONARY ARTERY BYPASS GRAFTING (CABG), ON PUMP, TIMES TWO, USING BILATERAL MAMMARY ARTERIES;  Surgeon: Zelphia Higashi, MD;  Location: [facility] OR;  Service: Open Heart Surgery;  Laterality: N/A;   ELBOW ARTHROPLASTY  05/30/1992   right   ENDARTERECTOMY FEMORAL Left 03/17/2015   Procedure: ENDARTERECTOMY COMMON FEMORAL WITH PROFUNDOPLASTY;  Surgeon: Richrd Char, MD;  Location: [facility] OR;  Service: Vascular;  Laterality: Left;   FEMORAL-POPLITEAL BYPASS GRAFT Left 03/17/2015   Procedure: BYPASS GRAFT FEMORAL-POPLITEAL ARTERY-LEFT LEG AND POSTERIOR TIBIAL SEQUENTIAL GRAFT TO BELOW KNEE POPLITEAL ARTERY;  Surgeon: Richrd Char, MD;  Location: [facility] OR;  Service: Vascular;  Laterality: Left;   FEMORAL-POPLITEAL BYPASS GRAFT Right 05/11/2015   Procedure:  RIGHT FEMORAL-BELOW THE KNEE POPLITEAL ARTERY BYPASS GRAFT USING NON REVERSE RIGHT GREATER SAPHENOUS VEIN;  Surgeon: Richrd Char, MD;  Location: [facility] OR;  Service: Vascular;  Laterality: Right;    gallbadder N/A 07/2023   head cancer  2025   INCISION AND DRAINAGE ABSCESS POSTERIOR CERVICALSPINE  05/31/2007   INTRAOPERATIVE ARTERIOGRAM Left 03/17/2015   Procedure: INTRA OPERATIVE ARTERIOGRAM X2;  Surgeon: Richrd Char, MD;  Location: [facility] OR;  Service: Vascular;  Laterality: Left;   INTRAOPERATIVE ARTERIOGRAM Right 05/11/2015   Procedure: INTRA OPERATIVE ARTERIOGRAM;  Surgeon: Richrd Char, MD;  Location: [facility] OR;  Service: Vascular;  Laterality: Right;   LEFT HEART CATH AND  CORONARY ANGIOGRAPHY N/A 03/01/2017   Procedure: LEFT HEART CATH AND CORONARY ANGIOGRAPHY;  Surgeon: Odie Benne, MD;  Location: [facility];  Service: Cardiovascular;  Laterality: N/A;   PATCH ANGIOPLASTY Left 03/17/2015   Procedure: VEIN PATCH ANGIOPLASTY COMMON FEMORAL ARTERY;  Surgeon: Richrd Char, MD;  Location: [facility] OR;  Service: Vascular;  Laterality: Left;   PERIPHERAL VASCULAR CATHETERIZATION N/A 01/16/2015   Procedure: Abdominal Aortogram;  Surgeon: Richrd Char, MD;  Location: [facility] INVASIVE CV LAB;  Service: Cardiovascular;  Laterality: N/A;   SHOULDER OPEN ROTATOR CUFF REPAIR Right 04/23/2013   Procedure: RIGHT TWO TENDON ROTATOR CUFF REPAIR  SUBACROMIAL DECOMPRESSION AND OPEN DISTAL CLAVICLE RESECTION;  Surgeon: Amelie Baize., MD;  Location: [facility];  Service: Orthopedics;  Laterality: Right;   stents placed   05/30/2004   TEE WITHOUT CARDIOVERSION N/A 03/02/2017   Procedure: TRANSESOPHAGEAL ECHOCARDIOGRAM (TEE);  Surgeon: Zelphia Higashi, MD;  Location: [facility] OR;  Service: Open Heart Surgery;  Laterality: N/A;   VEIN HARVEST Right 05/11/2015   Procedure: WITH NON REVERSE RIGHT GREATER SAPHENOUS VEIN HARVEST;  Surgeon: Richrd Char, MD;  Location: [facility] OR;  Service: Vascular;  Laterality: Right;    ALLERGIES: Allergies  Allergen Reactions   Codeine Itching and Other (See Comments)   Atorvastatin Other (See Comments)    Muscle aches Muscle  aches   Crestor [Rosuvastatin Calcium ]     Muscle aches   Meloxicam Other (See Comments)    Can not take due to kidneys   Statins Other (See Comments)    myalgia    FAMILY HISTORY: Family History  Problem Relation Age of Onset   Arthritis Mother    Varicose Veins Mother    Lung cancer Father    COPD Father        Lung   Cancer Brother        Lukemia   COPD Brother    Cancer Brother        Prostate    SOCIAL HISTORY: Social History   Tobacco Use   Smoking status: Former    Current packs/day: 0.00    Types: Cigarettes    Quit date: 05/30/1989    Years since quitting: 34.4    Passive exposure: Never   Smokeless tobacco: Never  Vaping Use   Vaping status: Never Used  Substance Use Topics   Alcohol use: Yes    Comment: 1 beer every 6 months   Drug use: Never   Social History   Social History Narrative   Lives with his wife.   One stepson.   Left-handed.   One cup caffeine per week.    Are you currently employed ?    What is your current occupation? retired   Do you live at home alone?   Who lives with you? wife   What type of home do you live in: 1 story or 2 story? one          Objective:  Vital Signs:  BP (!) 128/59   Pulse 67   Ht 6\' 2"  (1.88 m)   Wt 232 lb (105.2 kg)   SpO2 96%   BMI 29.79 kg/m   General: No acute distress.  Patient appears well-groomed.   Head:  Normocephalic/atraumatic Neck: supple Lungs: Non-labored breathing on room air   Neurological Exam: Mental status: alert and oriented, speech fluent and not dysarthric, language intact.  Cranial nerves: CN I: not tested CN II: pupils equal, round  and reactive to light, visual fields intact CN III, IV, VI:  full range of motion, no nystagmus, no ptosis CN V: facial sensation intact, except right forehead (chronic due to prior surgery). CN VII: upper and lower face symmetric CN VIII: hearing intact CN IX, X: uvula midline CN XI: sternocleidomastoid and trapezius muscles  intact CN XII: tongue midline  Bulk & Tone: normal, no fasciculations. Motor:  muscle strength 5/5 throughout Deep Tendon Reflexes:  1+ throughout.   Sensation:  Pinprick sensation diminished to mid-calf bilaterally. Finger to nose testing:  Without dysmetria.   Gait:  Normal station and stride.  Romberg with mild sway.   Labs and Imaging review: New results: 06/01/23: HbA1c: 6.0 Folate wnl MMA wnl IFE: poorly-defined area of restricted protein mobility is  detected and is reactive with IgG and kappa antisera.   Previously reviewed results: BMP (01/02/23): significant for glucose of 160 Lipid panel (11/30/22): tChol 132, LDL 56, TG 195 CMET (11/30/22): significant for Cr 1.39 TSH (05/13/22): wnl CBC (05/13/22): wnl   04/29/20: HbA1c: 5.6 CK: 126 ANA neg B12: 1455   Imaging: CT head and maxillofacial wo contrast (09/28/20): IMPRESSION: CT head:   No evidence of acute intracranial abnormality.   CT maxillofacial:   1. No evidence of acute maxillofacial fracture. 2. Right periorbital and maxillofacial hematoma with laceration.   MRI cervical spine wo contrast (04/21/20): FINDINGS:    On sagittal views the vertebral bodies have normal height and alignment. Multi-level cervical discectomy fusion C3-C7 levels with metallic hardware. The spinal cord is normal in size and appearance. The posterior fossa, pituitary gland and paraspinal soft tissues are unremarkable.     On axial views: C2-3: disc bulging and uncovertebral joint hypertrophy with mild left foraminal stenosis  C3-4: no spinal stenosis or foraminal narrowing  C4-5: no spinal stenosis or foraminal narrowing  C5-6: no spinal stenosis or foraminal narrowing  C6-7: no spinal stenosis or foraminal narrowing  C7-T1: rightward disc protrusion with moderate right foraminal stenosis  T1-2: no spinal stenosis or foraminal narrowing    Limited views of the soft tissues of the head and neck are unremarkable.     IMPRESSION:     MRI cervical spine (without) demonstrating: - At C7-T1: rightward disc protrusion with moderate right foraminal stenosis. - At C2-3: disc bulging and uncovertebral joint hypertrophy with mild left foraminal stenosis. - Multi-level cervical discectomy fusion C3-C7 levels with metallic hardware.    MRI lumbar spine (12/29/2014): FINDINGS: There is minimal curvature convex to the right with the apex at L3. There is no significant degenerative change at T12-L1 or L1-2. The distal cord and conus are normal with the conus tip at lower L1.   L2-3: Disc degeneration with moderate circumferential bulging. Mild facet degeneration and ligamentous hypertrophy. Mild narrowing of both lateral recesses without gross neural compression.   L3-4: Disc degeneration with moderate circumferential bulging. Facet degeneration and ligamentous hypertrophy. Mild stenosis of both lateral recesses. There would be some potential for neural compression or irritation in this location.   L4-5: Advanced disc degeneration with loss of height. Endplate osteophytes and circumferential protrusion of the disc. Facet degeneration with mild ligamentous hypertrophy. Narrowing of both lateral recesses that would have potential for neural compression. Mild foraminal narrowing bilaterally as well.   L5-S1: Advanced disc degeneration with near complete loss of disc height. Endplate osteophytes and mild bulging of residual disc material. Mild facet degeneration and hypertrophy. No central canal or lateral recess stenosis. Mild foraminal narrowing without definite compression  of the exiting L5 nerve roots.   IMPRESSION: Degenerative disc disease and degenerative facet disease throughout the lower lumbar region that could certainly be associated with low back pain.   Bulging disc sent facet hypertrophy with lateral recess narrowing that could possibly be symptomatic at L3-4 and L4-5. Mild foraminal narrowing bilaterally  at L4-5 and L5-S1 that could possibly be associated with neural irritation. Definite neural compression is not demonstrated however.   EMG (04/29/20 at [facility] by Dr. Gracie Lav): Summary of the test: Nerve conduction study: Bilateral sural, superficial peroneal sensory responses were normal.   Bilateral peroneal to EDB and tibial motor responses were normal.   Electromyography: Selected needle examination of bilateral lower extremity muscles and bilateral lumbosacral paraspinal muscles were normal.   Conclusion: This is a normal study.  There is no electrodiagnostic evidence of large fiber peripheral neuropathy or bilateral lumbosacral radiculopathy.   External EMG at [facility] (04/03/23):      Assessment/Plan:  This is Derrick [name], a 68 y.o. male with pain in bilateral lower extremities. Previous EMG showed chronic radiculopathy and large fiber sensorimotor neuropathy. Patient's symptoms are likely multifactorial with contributions from spine disease, arthritis, PVD, and a distal symmetric polyneuropathy. His known risk factors for neuropathy include metabolic syndrome. His IFE was indeterminate at last visit, so I will repeat today.  Plan: -Blood work: SPEP, IFE -Increase: gabapentin  300 mg BID -Continue alpha lipoic acid 600 mg  -Gave OTC recommendations for cramps  Return to clinic in 6 months  Total time spent reviewing records, interview, history/exam, documentation, and coordination of care on day of encounter:  40 min  Rommie Coats, MD

## 2023-10-27 ENCOUNTER — Other Ambulatory Visit: Payer: Self-pay | Admitting: Nephrology

## 2023-10-27 ENCOUNTER — Encounter: Payer: Self-pay | Admitting: Neurology

## 2023-10-27 ENCOUNTER — Ambulatory Visit (INDEPENDENT_AMBULATORY_CARE_PROVIDER_SITE_OTHER): Admitting: Neurology

## 2023-10-27 ENCOUNTER — Other Ambulatory Visit

## 2023-10-27 VITALS — BP 128/59 | HR 67 | Ht 74.0 in | Wt 232.0 lb

## 2023-10-27 DIAGNOSIS — Z981 Arthrodesis status: Secondary | ICD-10-CM | POA: Diagnosis not present

## 2023-10-27 DIAGNOSIS — G629 Polyneuropathy, unspecified: Secondary | ICD-10-CM | POA: Diagnosis not present

## 2023-10-27 DIAGNOSIS — M1990 Unspecified osteoarthritis, unspecified site: Secondary | ICD-10-CM

## 2023-10-27 DIAGNOSIS — M47812 Spondylosis without myelopathy or radiculopathy, cervical region: Secondary | ICD-10-CM

## 2023-10-27 DIAGNOSIS — I739 Peripheral vascular disease, unspecified: Secondary | ICD-10-CM

## 2023-10-27 DIAGNOSIS — N1831 Chronic kidney disease, stage 3a: Secondary | ICD-10-CM

## 2023-10-27 DIAGNOSIS — M25551 Pain in right hip: Secondary | ICD-10-CM

## 2023-10-27 DIAGNOSIS — M792 Neuralgia and neuritis, unspecified: Secondary | ICD-10-CM

## 2023-10-27 DIAGNOSIS — M25552 Pain in left hip: Secondary | ICD-10-CM

## 2023-10-27 DIAGNOSIS — N281 Cyst of kidney, acquired: Secondary | ICD-10-CM

## 2023-10-27 MED ORDER — GABAPENTIN 300 MG PO CAPS: 300.0000 mg | ORAL_CAPSULE | Freq: Two times a day (BID) | ORAL | 11 refills | Status: AC

## 2023-10-27 NOTE — Patient Instructions (Addendum)
 I will repeat some blood work today. I will be in touch when I have your results.  Will try gabapentin  300 mg twice daily.  Continue alpha lipoic acid 600 mg daily.  Recommend the following measures that may provide some symptomatic benefit for muscle twitching and/or cramps: - Adequate oral clear fluid intake to maintain optimal hydration (about 2.5 liters, or around 8-10 glasses per day) Avoidance of caffeine Trial of DIET tonic water: About 1 glass, up to 6 times daily Magnesium  oxide up to 400 mg by mouth twice daily, as needed (over the counter) Gentle muscle stretching routine, especially before bedtime   The physicians and staff at [facility] are committed to providing excellent care. You may receive a survey requesting feedback about your experience at our office. We strive to receive "very good" responses to the survey questions. If you feel that your experience would prevent you from giving the office a "very good " response, please contact our office to try to remedy the situation. We may be reached at [phone]. Thank you for taking the time out of your busy day to complete the survey.  Rommie Coats, MD [facility]  Preventing Falls at [facility] are common, often dreaded events in the lives of older people. Aside from the obvious injuries and even death that may result, fall can cause wide-ranging consequences including loss of independence, mental decline, decreased activity and mobility. Younger people are also at risk of falling, especially those with chronic illnesses and fatigue.  Ways to reduce risk for falling Examine diet and medications. Warm foods and alcohol dilate blood vessels, which can lead to dizziness when standing. Sleep aids, antidepressants and pain medications can also increase the likelihood of a fall.  Get a vision exam. Poor vision, cataracts and glaucoma increase the chances of falling.  Check foot gear. Shoes should fit snugly and  have a sturdy, nonskid sole and a broad, low heel  Participate in a physician-approved exercise program to build and maintain muscle strength and improve balance and coordination. Programs that use ankle weights or stretch bands are excellent for muscle-strengthening. Water aerobics programs and low-impact Tai Chi programs have also been shown to improve balance and coordination.  Increase vitamin D intake. Vitamin D improves muscle strength and increases the amount of calcium  the body is able to absorb and deposit in bones.  How to prevent falls from common hazards Floors - Remove all loose wires, cords, and throw rugs. Minimize clutter. Make sure rugs are anchored and smooth. Keep furniture in its usual place.  Chairs -- Use chairs with straight backs, armrests and firm seats. Add firm cushions to existing pieces to add height.  Bathroom - Install grab bars and non-skid tape in the tub or shower. Use a bathtub transfer bench or a shower chair with a back support Use an elevated toilet seat and/or safety rails to assist standing from a low surface. Do not use towel racks or bathroom tissue holders to help you stand.  Lighting - Make sure halls, stairways, and entrances are well-lit. Install a night light in your bathroom or hallway. Make sure there is a light switch at the top and bottom of the staircase. Turn lights on if you get up in the middle of the night. Make sure lamps or light switches are within reach of the bed if you have to get up during the night.  Kitchen - Install non-skid rubber mats near the sink and stove. Clean spills immediately. Store  frequently used utensils, pots, pans between waist and eye level. This helps prevent reaching and bending. Sit when getting things out of lower cupboards.  Living room/ Bedrooms - Place furniture with wide spaces in between, giving enough room to move around. Establish a route through the living room that gives you something to hold onto as you  walk.  Stairs - Make sure treads, rails, and rugs are secure. Install a rail on both sides of the stairs. If stairs are a threat, it might be helpful to arrange most of your activities on the lower level to reduce the number of times you must climb the stairs.  Entrances and doorways - Install metal handles on the walls adjacent to the doorknobs of all doors to make it more secure as you travel through the doorway.  Tips for maintaining balance Keep at least one hand free at all times. Try using a backpack or fanny pack to hold things rather than carrying them in your hands. Never carry objects in both hands when walking as this interferes with keeping your balance.  Attempt to swing both arms from front to back while walking. This might require a conscious effort if Parkinson's disease has diminished your movement. It will, however, help you to maintain balance and posture, and reduce fatigue.  Consciously lift your feet off of the ground when walking. Shuffling and dragging of the feet is a common culprit in losing your balance.  When trying to navigate turns, use a "U" technique of facing forward and making a wide turn, rather than pivoting sharply.  Try to stand with your feet shoulder-length apart. When your feet are close together for any length of time, you increase your risk of losing your balance and falling.  Do one thing at a time. Don't try to walk and accomplish another task, such as reading or looking around. The decrease in your automatic reflexes complicates motor function, so the less distraction, the better.  Do not wear rubber or gripping soled shoes, they might "catch" on the floor and cause tripping.  Move slowly when changing positions. Use deliberate, concentrated movements and, if needed, use a grab bar or walking aid. Count 15 seconds between each movement. For example, when rising from a seated position, wait 15 seconds after standing to begin walking.  If balance is  a continuous problem, you might want to consider a walking aid such as a cane, walking stick, or walker. Once you've mastered walking with help, you might be ready to try it on your own again.

## 2023-10-30 ENCOUNTER — Ambulatory Visit
Admission: RE | Admit: 2023-10-30 | Discharge: 2023-10-30 | Disposition: A | Discharge status: Home / Self Care | Source: Ambulatory Visit | Attending: Nephrology | Admitting: Nephrology

## 2023-10-30 DIAGNOSIS — N1831 Chronic kidney disease, stage 3a: Secondary | ICD-10-CM

## 2023-10-30 DIAGNOSIS — N281 Cyst of kidney, acquired: Secondary | ICD-10-CM

## 2023-11-02 ENCOUNTER — Ambulatory Visit: Payer: Self-pay | Admitting: Neurology

## 2023-11-02 LAB — PROTEIN ELECTROPHORESIS, SERUM
Albumin ELP: 4.2 g/dL (ref 3.8–4.8)
Alpha 1: 0.4 g/dL — ABNORMAL HIGH (ref 0.2–0.3)
Alpha 2: 0.8 g/dL (ref 0.5–0.9)
Beta 2: 0.4 g/dL (ref 0.2–0.5)
Beta Globulin: 0.5 g/dL (ref 0.4–0.6)
Gamma Globulin: 0.9 g/dL (ref 0.8–1.7)
Total Protein: 7.2 g/dL (ref 6.1–8.1)

## 2023-11-02 LAB — IMMUNOFIXATION ELECTROPHORESIS
IgM, Serum: 105 mg/dL (ref 50–300)
IgM, Serum: 996 mg/dL (ref 600–300)
Immunoglobulin A: 144 mg/dL (ref 70–320)
Immunoglobulin A: 996 mg/dL (ref 70–320)

## 2023-11-06 ENCOUNTER — Ambulatory Visit (HOSPITAL_COMMUNITY)
Admission: RE | Admit: 2023-11-06 | Discharge: 2023-11-06 | Disposition: A | Discharge status: Home / Self Care | Payer: Medicare Other | Source: Ambulatory Visit | Attending: Surgery | Admitting: Surgery

## 2023-11-06 ENCOUNTER — Ambulatory Visit (INDEPENDENT_AMBULATORY_CARE_PROVIDER_SITE_OTHER): Payer: Medicare Other | Admitting: Physician Assistant

## 2023-11-06 VITALS — BP 144/78 | HR 59 | Temp 97.9°F | Ht 74.0 in | Wt 233.7 lb

## 2023-11-06 DIAGNOSIS — I739 Peripheral vascular disease, unspecified: Secondary | ICD-10-CM | POA: Diagnosis not present

## 2023-11-06 LAB — VAS US ABI WITH/WO TBI

## 2023-11-09 ENCOUNTER — Ambulatory Visit: Payer: Medicare Other

## 2023-11-09 NOTE — Progress Notes (Signed)
 Office Note   History of Present Illness   Derrick [name] is a 68 y.o. ([birth year]) male who presents for surveillance of PAD.  He has a remote history of bilateral iliac stenting, right common femoral to below-knee popliteal artery bypass, and left common femoral to posterior tibial artery bypass by Dr. Nolene Baumgarten in 2016.  All of these procedures were performed secondary to lifestyle limiting claudication.  He returns today for follow-up.  He says that he is okay.  He continues to have progressive symptoms of numbness and burning in the feet.  This is being managed by his neurologist.  Outside of this, he denies any claudication, rest pain, or tissue loss.  He takes a daily aspirin  and statin.   Current Outpatient Medications  Medication Sig Dispense Refill   acetaminophen  (TYLENOL ) 650 MG CR tablet Take 1,300 mg by mouth in the morning and at bedtime.     albuterol (VENTOLIN HFA) 108 (90 Base) MCG/ACT inhaler Inhale 2 puffs into the lungs every 4 (four) hours as needed for wheezing or shortness of breath.     Alpha-Lipoic Acid 600 MG TABS Take 600 mg by mouth at bedtime.     amLODipine  (NORVASC ) 2.5 MG tablet Take 1 tablet (2.5 mg total) by mouth daily. 90 tablet 3   aspirin  81 MG EC tablet Take 81 mg by mouth in the morning.     calcitRIOL  (ROCALTROL ) 0.5 MCG capsule Take 0.5 mcg by mouth in the morning.     Calcium  Carb-Cholecalciferol (CALCIUM  600+D3 PO) Take 1 tablet by mouth in the morning and at bedtime.     carvedilol  (COREG ) 12.5 MG tablet TAKE 1 TABLET TWICE A DAY (Patient taking differently: Take 6.25 mg by mouth 2 (two) times daily.) 180 tablet 3   Cyanocobalamin 2500 MCG TABS Take 2,500 mcg by mouth in the morning.     ezetimibe  (ZETIA ) 10 MG tablet Take 10 mg by mouth every evening.     fexofenadine (ALLEGRA ODT) 30 MG disintegrating tablet Take 30 mg by mouth daily. (Patient not taking: Reported on 10/27/2023)     fish oil-omega-3 fatty acids  1000 MG capsule Take 1 capsule  (1,000 mg total) by mouth in the morning and at bedtime. 60 capsule 0   furosemide  (LASIX ) 20 MG tablet TAKE 1 TABLET BY MOUTH DAILY EXCEPT MONDAYS AND THURSDAYS TAKE 2 TABLETS BY MOUTH DAILY 98 tablet 3   gabapentin  (NEURONTIN ) 300 MG capsule Take 1 capsule (300 mg total) by mouth 2 (two) times daily. 60 capsule 11   ipratropium (ATROVENT HFA) 17 MCG/ACT inhaler Inhale 2 puffs into the lungs every 6 (six) hours as needed for wheezing.     ipratropium (ATROVENT) 0.03 % nasal spray Place 1 spray into both nostrils 2 (two) times daily as needed for rhinitis.     losartan  (COZAAR ) 100 MG tablet Take 100 mg by mouth every evening.      Magnesium  250 MG TABS Take 250 mg by mouth every evening.     Menthol , Topical Analgesic, (BIOFREEZE EX) Apply 1 Application topically 3 (three) times daily as needed (back pain/soreness).     mupirocin  ointment (BACTROBAN ) 2 % Apply topically.     nitroGLYCERIN  (NITROSTAT ) 0.4 MG SL tablet Place 1 tablet (0.4 mg total) under the tongue every 5 (five) minutes as needed for chest pain. 25 tablet 3   potassium chloride  (KLOR-CON ) 10 MEQ tablet TAKE 1 TABLET EVERY DAY 90 tablet 2   pravastatin  (PRAVACHOL ) 20 MG tablet TAKE  1 TABLET EVERY EVENING 90 tablet 3   Propylene Glycol (SYSTANE BALANCE) 0.6 % SOLN Place 1 drop into both eyes in the morning and at bedtime.     tamsulosin  (FLOMAX ) 0.4 MG CAPS capsule Take 0.4 mg by mouth 2 (two) times daily.     TART CHERRY PO Take 3,000 mg by mouth every evening.     traZODone  (DESYREL ) 50 MG tablet Take 100 mg by mouth at bedtime.     Current Facility-Administered Medications  Medication Dose Route Frequency Provider Last Rate Last Admin   inclisiran (LEQVIO ) injection 284 mg  284 mg Subcutaneous Once Nahser, Lela Purple, MD        REVIEW OF SYSTEMS (negative unless checked):   Cardiac:  []  Chest pain or chest pressure? []  Shortness of breath upon activity? []  Shortness of breath when lying flat? []  Irregular heart  rhythm?  Vascular:  []  Pain in calf, thigh, or hip brought on by walking? []  Pain in feet at night that wakes you up from your sleep? []  Blood clot in your veins? []  Leg swelling?  Pulmonary:  []  Oxygen at home? []  Productive cough? []  Wheezing?  Neurologic:  []  Sudden weakness in arms or legs? []  Sudden numbness in arms or legs? []  Sudden onset of difficult speaking or slurred speech? []  Temporary loss of vision in one eye? []  Problems with dizziness?  Gastrointestinal:  []  Blood in stool? []  Vomited blood?  Genitourinary:  []  Burning when urinating? []  Blood in urine?  Psychiatric:  []  Major depression  Hematologic:  []  Bleeding problems? []  Problems with blood clotting?  Dermatologic:  []  Rashes or ulcers?  Constitutional:  []  Fever or chills?  Ear/Nose/Throat:  []  Change in hearing? []  Nose bleeds? []  Sore throat?  Musculoskeletal:  [x]  Back pain? [x]  Joint pain? []  Muscle pain?   Physical Examination   Vitals:   11/06/23 1107  BP: (!) 144/78  Pulse: (!) 59  Temp: 97.9 F (36.6 C)  TempSrc: Temporal  SpO2: 93%  Weight: 233 lb 11.2 oz (106 kg)  Height: 6' 2 (1.88 m)   Body mass index is 30.01 kg/m.  General:  WDWN in NAD; vital signs documented above Gait: Not observed HENT: WNL, normocephalic Pulmonary: normal non-labored breathing Cardiac: regular Abdomen: soft, NT, no masses Skin: without rashes Vascular Exam/Pulses: 2+ femoral and DP pulses bilaterally Extremities: without ischemic changes, without gangrene , without cellulitis; without open wounds;  Musculoskeletal: no muscle wasting or atrophy  Neurologic: A&O X 3;  No focal weakness or paresthesias are detected Psychiatric:  The pt has Normal affect.  Non-Invasive Vascular Imaging ABI (11/06/2023) R:  ABI: [place] (1.1),  PT: tri DP: tri TBI: 1.19 L:  ABI: [place] ([place]),  PT: tri DP: tri TBI: 0.63  Aortoiliac Duplex (11/06/2023) Patent bilateral common iliac artery stents  without flow-limiting stenosis.  1 to 49% stenosis of distal left common iliac artery stent  BLE Bypass Duplex (11/06/2023) Patent bilateral lower extremity bypass graft without stenosis.  Unchanged sluggish velocities at the distal portion of the left lower extremity bypass graft, with a PSV of 32 cm/s.  Sluggish velocities were visualized at his last follow-up 6 months ago.   Medical Decision Making   Derrick [name] is a 68 y.o. male who presents for surveillance of PAD  Based on the patient's vascular studies, his ABIs are noncompressible bilaterally.  He has triphasic tibial vessel flow Aortoiliac duplex demonstrates patent bilateral common iliac artery stents without flow-limiting stenosis.  There  is minimal stenosis of the distal left common iliac artery stent Duplex also demonstrates patent bilateral lower extremity bypass graft without stenosis.  There are continued sluggish velocities at the distal portion of the left lower extremity bypass graft, with a PSV of 32 cm/s.  This was visualized at his last follow-up 6 months ago.  At this time I do not think this area requires intervention, given that it is unchanged over the course of 6 months and he maintains triphasic flow throughout his bypass graft On exam he has palpable femoral and DP pulses bilaterally.  He denies any rest pain, claudication, or tissue loss He will continue to take his daily aspirin  and statin and follow-up with our office in 1 year with repeat ABIs, BLE bypass graft duplex, and bilateral aortoiliac duplex    Deneise Finlay PA-C [facility] Office: [phone]  Clinic MD: Charlotte Cookey

## 2023-11-14 ENCOUNTER — Ambulatory Visit

## 2023-11-14 VITALS — BP 151/75 | HR 60 | Temp 98.0°F | Resp 18 | Ht 74.0 in | Wt 232.8 lb

## 2023-11-14 DIAGNOSIS — E782 Mixed hyperlipidemia: Secondary | ICD-10-CM

## 2023-11-14 MED ORDER — INCLISIRAN SODIUM 284 MG/1.5ML ~~LOC~~ SOSY
284.0000 mg | PREFILLED_SYRINGE | Freq: Once | SUBCUTANEOUS | Status: AC
  Administered 2023-11-14: 284 mg via SUBCUTANEOUS
  Filled 2023-11-14: qty 1.5

## 2023-11-14 NOTE — Progress Notes (Signed)
 Diagnosis: Hyperlipidemia  Provider:  Mannam, Praveen MD  Procedure: Injection  Leqvio  (inclisiran), Dose: 284 mg, Site: subcutaneous, Number of injections: 1  Injection Site(s): Left deltoid  Post Care: Patient declined observation  Discharge: Condition: Good, Destination: Home . AVS Declined  Performed by:  Devonne Folk, RN

## 2023-11-29 ENCOUNTER — Ambulatory Visit: Payer: Medicare Other | Admitting: Neurology

## 2024-01-16 ENCOUNTER — Other Ambulatory Visit: Payer: Self-pay | Admitting: Physician Assistant

## 2024-01-17 ENCOUNTER — Telehealth: Payer: Self-pay | Admitting: Cardiology

## 2024-01-17 MED ORDER — AMLODIPINE BESYLATE 2.5 MG PO TABS: 2.5000 mg | ORAL_TABLET | Freq: Every day | ORAL | 1 refills | Status: DC

## 2024-01-17 NOTE — Telephone Encounter (Signed)
*  STAT* If patient is at the pharmacy, call can be transferred to refill team.   1. Which medications need to be refilled? (please list name of each medication and dose if known)   amLODipine  (NORVASC ) 2.5 MG tablet (Expired)    2. Which pharmacy/location (including street and city if local pharmacy) is medication to be sent to?  [facility] - [place], [place] - [address]      3. Do they need a 30 day or 90 day supply? 90 day

## 2024-01-17 NOTE — Telephone Encounter (Signed)
 RX sent in

## 2024-01-18 MED ORDER — FUROSEMIDE 20 MG PO TABS: ORAL_TABLET | ORAL | 3 refills | Status: AC

## 2024-01-19 ENCOUNTER — Telehealth: Payer: Self-pay | Admitting: Cardiology

## 2024-01-19 NOTE — Telephone Encounter (Signed)
 Called patient. Verified name and DOB.  Patient is having shortness of breath upon exertion. Blood pressure read 125/69 01/18/24 Patient denies chest pain, dizziness, and headache.  Scheduled patient to see Dr. Anner on 02/05/24 at 11:40. Advised patient to go to ER if symptoms worsen or if new symptoms arise. Patient verbalized understanding.  Derrick [name]

## 2024-01-19 NOTE — Telephone Encounter (Signed)
  Pt c/o of Chest Pain: STAT if active CP, including tightness, pressure, jaw pain, radiating pain to shoulder/upper arm/back, CP unrelieved by Nitro. Symptoms reported of SOB, nausea, vomiting, sweating.  1. Are you having CP right now?   No   2. Are you experiencing any other symptoms (ex. SOB, nausea, vomiting, sweating)?   SOB on exertion  3. Is your CP continuous or coming and going?   Coming and going  4. Have you taken Nitroglycerin ?   No  5. How long have you been experiencing CP?  About 2-3 weeks  6. If NO CP at time of call then end call with telling Pt to call back or call 911 if Chest pain returns prior to return call from triage team.   Patient is concerned he has been having chest pains/tightness in his chest when he exerts himself.

## 2024-01-21 ENCOUNTER — Encounter: Payer: Self-pay | Admitting: Cardiology

## 2024-01-23 ENCOUNTER — Telehealth: Payer: Self-pay | Admitting: Cardiology

## 2024-01-23 NOTE — Telephone Encounter (Signed)
 Pt dropped off surgery clearance paperwork for [facility] to be filled out by Dr. Anner.   Location: Dr. Anner mailbox.

## 2024-01-24 NOTE — Telephone Encounter (Signed)
   Pre-operative Risk Assessment    Patient Name: Derrick [name]  DOB: [birth year] MRN: [id number]   Date of last office visit: 06/14/2023  with Dr Alveta Date of next office visit: 02/05/24   Request for Surgical Clearance    Procedure:  L3-4 decompression  Date of Surgery:  Clearance TBD                                Surgeon:  Dr Donaciano Sprang Surgeon's Group or Practice Name:  [facility] Phone number:  [phone] Fax number:  [phone]  Attn Katheryn Dustman   Type of Clearance Requested:   - Medical    Type of Anesthesia:  Not Indicated   Additional requests/questions:    Derrick [name]   01/24/2024, 6:05 PM

## 2024-01-24 NOTE — Telephone Encounter (Signed)
 Receive clearance form   Routed to Pre -op  to following up

## 2024-01-25 NOTE — Telephone Encounter (Signed)
   Name: Derrick [name]  DOB: 09/10/1955  MRN: [id number]  Primary Cardiologist: Alm Clay, MD  Chart reviewed as part of pre-operative protocol coverage. The patient has an upcoming visit scheduled with Dr. Clay on 02/05/2024 at which time clearance can be addressed in case there are any issues that would impact surgical recommendations.  I added preop FYI to appointment note so that provider is aware to address at time of outpatient visit.  Per office protocol the cardiology provider should forward their finalized clearance decision and recommendations regarding antiplatelet therapy to the requesting party below.    I will route this message as FYI to requesting party and remove this message from the preop box as separate preop APP input not needed at this time.   Please call with any questions.  Wyn Raddle, Jackee Shove, NP  01/25/2024, 6:59 AM

## 2024-02-05 ENCOUNTER — Encounter: Payer: Self-pay | Admitting: Cardiology

## 2024-02-05 ENCOUNTER — Ambulatory Visit: Attending: Cardiology | Admitting: Cardiology

## 2024-02-05 VITALS — BP 132/56 | HR 51 | Ht 74.0 in | Wt 232.8 lb

## 2024-02-05 DIAGNOSIS — E782 Mixed hyperlipidemia: Secondary | ICD-10-CM | POA: Insufficient documentation

## 2024-02-05 DIAGNOSIS — N1831 Chronic kidney disease, stage 3a: Secondary | ICD-10-CM | POA: Insufficient documentation

## 2024-02-05 DIAGNOSIS — T466X5A Adverse effect of antihyperlipidemic and antiarteriosclerotic drugs, initial encounter: Secondary | ICD-10-CM | POA: Insufficient documentation

## 2024-02-05 DIAGNOSIS — I200 Unstable angina: Secondary | ICD-10-CM | POA: Diagnosis present

## 2024-02-05 DIAGNOSIS — I10 Essential (primary) hypertension: Secondary | ICD-10-CM | POA: Diagnosis present

## 2024-02-05 DIAGNOSIS — G720 Drug-induced myopathy: Secondary | ICD-10-CM | POA: Insufficient documentation

## 2024-02-05 DIAGNOSIS — I25110 Atherosclerotic heart disease of native coronary artery with unstable angina pectoris: Secondary | ICD-10-CM | POA: Diagnosis not present

## 2024-02-05 DIAGNOSIS — Z951 Presence of aortocoronary bypass graft: Secondary | ICD-10-CM | POA: Diagnosis present

## 2024-02-05 DIAGNOSIS — I739 Peripheral vascular disease, unspecified: Secondary | ICD-10-CM | POA: Diagnosis present

## 2024-02-05 MED ORDER — ISOSORBIDE MONONITRATE ER 30 MG PO TB24: 30.0000 mg | ORAL_TABLET | Freq: Every day | ORAL | 3 refills | Status: DC

## 2024-02-05 NOTE — Assessment & Plan Note (Signed)
 Chronic kidney disease stage 3 with a creatinine level of 1.55.  Continue ARB for renal protection.  Continue PRN Lasix .  Continue afterload reduction/BP control with additional medication-amlodipine  2.5 mg daily.

## 2024-02-05 NOTE — Progress Notes (Signed)
 Cardiology Office Note:  .   Date:  02/05/2024  ID:  Elspeth [name] Louder, DOB 12/04/1955, MRN [id number] PCP: Marvene Prentice [name], [name]  [facility] Providers Cardiologist:  Alm Clay, MD Cardiology APP:  Lelon Glendia DASEN, PA-C     Chief Complaint  Patient presents with   Follow-up    Establish new cardiologist.   Coronary Artery Disease    Now having exertional dyspnea with some chest tightness    Patient Profile: .     NAIF ALABI is a borderline obese 68 y.o. male former smoker with a PMH notable for CAD-PCI followed by CABG with HTN HLD and nonocclusive PAD/carotid disease who presents here for delayed 26-month follow-up to discuss exertional dyspnea and chest tightness at the request of Marvene Prentice [name], [name].  CAD: LAD-LCx PCI 2006; known RCA CTO 2018-Revealed ostial LM disease with mild ISR in the LCx and LAD.  L-R collaterals to RCA CTO => CABG x 2 LIMA-LAD and Y graft from LIMA-RIMA-OM stents x2=LAD LCX; totally occluded RCA b. CABG 03/02/17 x2 (left internal mammary artery to LAD, Y-graft right mammary from left mammary to OM).//  Myoview  9/22: Inferior infarct, no ischemia, EF 61; low risk //  Echocardiogram 9/22: EF 55-60, no RWMA, GLS -24.5%, normal RVSF, RVSP 30.7, mild LAE, trivial MR, trivial AI, mild AV sclerosis w/o AS   Chronic HFpEF HLD (statin myopathy): On Zetia  10 mg daily, alpha lipoic acid and Leqvio  (June-December) HTN Nonocclusive PAD/carotid disease followed by VVS CKD-2     Elspeth [name] Louder was last seen by Dr. Alveta on June 14, 2023 for follow-up.  Home BP meds were relatively elevated.  Starting exercise now and doing stationary bike.  No chest pain.  LDL from PCP was 56.  No active symptoms.  No changes made.  Subjective  Discussed the use of AI scribe software for clinical note transcription with the patient, who gave verbal consent to proceed.  History of Present Illness [place] presents here today for follow-up stating that he has been  experiencing chest pain for the past two months, described as a tightness in the chest occurring with minimal exertion, such as taking out the trash. The pain is reminiscent of what he felt prior to his bypass surgery in 2018, though less severe. During these episodes, he also experiences shortness of breath and tightness in his head.  His medical history includes coronary artery disease, with stent placement in 2006 and a two-vessel bypass surgery in 2018 due to left main disease. The bypass involved grafting arteries from his chest wall. One of the arteries was left closed during surgery. He has not had recent procedures for his leg arteries but undergoes regular check-ups and Doppler studies.  His current medications include amlodipine  2.5 mg, carvedilol  12.5 mg twice daily, losartan  100 mg daily, Zetia  10 mg daily, pravastatin  20 mg, and furosemide  with a higher dose on Mondays and Thursdays. He also takes aspirin  and Leqvio  injections.  No shortness of breath when lying flat, waking up short of breath, heart palpitations, or blood in stools. He reports swelling in his left leg, which has been present since his heart surgery, and neuropathy. No new swelling or changes in his condition. No syncope or near syncope.  No TIA or amaurosis fugax.  No claudication.  ROS:  Review of Systems - Negative except symptoms noted above    Objective   Pertinent CV Medications - Leqvio ; - Zetia  10 mg a day; - Fish oil; -  Pravastatin  20 mg; alpha lipoic acid - Amlodipine  2.5 mg; - Carvedilol  12.5 mg twice a day - Losartan  100 mg a day - Furosemide  40 mg twice a day - Aspirin  81 mg daily  Studies Reviewed: SABRA   EKG Interpretation Date/Time:  Monday February 05 2024 12:07:54 EDT Ventricular Rate:  50 PR Interval:  214 QRS Duration:  92 QT Interval:  456 QTC Calculation: 415 R Axis:   53  Text Interpretation: Sinus bradycardia with 1st degree A-V block When compared with ECG of 03-Mar-2017 06:30, PR  interval has increased ST no longer elevated in Anterolateral leads Confirmed by Anner Lenis ([id number]) on 02/05/2024 6:59:06 PM    Lab Results  Component Value Date   CHOL 132 11/30/2022   HDL 44 11/30/2022   LDLCALC 56 11/30/2022   TRIG 195 (H) 11/30/2022   CHOLHDL 3.0 11/30/2022  05/2023:TC 113, Tg 189, HDL 43, LDL 40; TSH 2.48  Results LABS Hb: 13.2 (09/2023) Cr: 1.55 (09/2023) A1c: 6.0 (12/20/2023) TSH: 2.49 (05/2023) ECHO 05/2022:  1. Left ventricular ejection fraction, by estimation, is 55 to 60% . Left ventricular ejection fraction by 3D volume is 58 % . The left ventricle has normal function. The left ventricle has no regional wall motion abnormalities. Left ventricular diastolic parameters were normal. 2. Right ventricular systolic function is normal. The right ventricular size is mildly enlarged. There is normal pulmonary artery systolic pressure. The estimated right ventricular systolic pressure is 34. 8 mmHg. 3. Left atrial size was mildly dilated. 4. Right atrial size was mildly dilated. 5. The mitral valve is degenerative. Mild mitral valve regurgitation. No evidence of mitral stenosis. 6. The aortic valve is tricuspid. Aortic valve regurgitation is trivial. Aortic valve sclerosis/ calcification is present, without any evidence of aortic stenosis. 7. The inferior vena cava is normal in size with greater than 50% respiratory variability, suggesting right atrial pressure of 3 mmHg. CATH 02/2017: Dominance: Right; Ost RCA 100%; Ost LM 70%, P Cx 50%, mLCX stent 10%l mLAD stent 10%  => CABG x 2 LIMA-LAD, y GRAFT RIMA - OM     Risk Assessment/Calculations:             Physical Exam:   VS:  BP (!) 132/56   Pulse (!) 51   Ht 6' 2 (1.88 m)   Wt 232 lb 12.8 oz (105.6 kg)   SpO2 98%   BMI 29.89 kg/m    Wt Readings from Last 3 Encounters:  02/05/24 232 lb 12.8 oz (105.6 kg)  11/14/23 232 lb 12.8 oz (105.6 kg)  11/06/23 233 lb 11.2 oz (106 kg)      GEN: Well nourished, well  developed in no acute distress; borderline obese NECK: No JVD; No carotid bruits CARDIAC: Normal S1, S2; RRR, no murmurs, rubs, gallops RESPIRATORY:  Clear to auscultation without rales, wheezing or rhonchi ; nonlabored, good air movement. ABDOMEN: Soft, non-tender, non-distended EXTREMITIES:  No edema; No deformity      ASSESSMENT AND PLAN: .    Problem List Items Addressed This Visit       Cardiology Problems   Coronary artery disease involving native heart with unstable angina pectoris (HCC) (Chronic)   Exertional angina for two months, similar to pre-bypass symptoms but less severe. Concerns about graft patency. Differential includes graft occlusion or native artery disease. Risks of intervention include potential complications from catheterization and stenting, especially if left main disease is present. Decision to proceed with intervention will depend on catheterization findings. - Schedule heart catheterization  for Friday, February 09, 2024, to assess graft patency and coronary anatomy. - Prescribe long-acting nitroglycerin  to be taken in the evening. => Imdur  30 mg nightly - Continue carvedilol  12.5 mg twice daily and amlodipine  2.5 midodrine daily along with losartan  100 mg daily - Continue aspirin  81 mg daily -Will plan for diagnostic cardiac catheterization with Left Heart Catheterization with Native Coronary and Graft Angiography and Possible Percutaneous Coronary Intervention - Order blood work prior to catheterization. - Advise to seek immediate care if symptoms occur at rest. - Plan for potential intervention based on catheterization findings.      Relevant Medications   carvedilol  (COREG ) 6.25 MG tablet   isosorbide  mononitrate (IMDUR ) 30 MG 24 hr tablet   Other Relevant Orders   CBC   Basic metabolic panel with GFR   Hyperlipidemia (Chronic)   Hyperlipidemia with LDL goal less than 55.  As of January 2025, LDL was still 40 mmHg.  Continue current  medications. Hyperlipidemia managed with Leqvio , Zetia , and pravastatin .       Relevant Medications   carvedilol  (COREG ) 6.25 MG tablet   isosorbide  mononitrate (IMDUR ) 30 MG 24 hr tablet   Hypertension (Chronic)   Hypertension managed with amlodipine  2.5 mg daily, carvedilol  12.5 mg twice daily, and losartan  100 mg daily.   Blood pressure is well-controlled. => There is some room to potentially titrate up amlodipine , but for now would use a quicker acting medication such as Imdur  to help treat his angina.      Relevant Medications   carvedilol  (COREG ) 6.25 MG tablet   isosorbide  mononitrate (IMDUR ) 30 MG 24 hr tablet   PAD (peripheral artery disease) (HCC) (Chronic)   Relevant Medications   carvedilol  (COREG ) 6.25 MG tablet   isosorbide  mononitrate (IMDUR ) 30 MG 24 hr tablet   Other Relevant Orders   EKG 12-Lead (Completed)   Progressive angina (HCC) - Primary   Recurrence of exertional dyspnea and chest tightness symptoms concerning for progressive angina over the last several months. At this point will defer his preop assessment and proceed with coronary ischemia evaluation with cardiac catheterization and possible PCI.  - Add Imdur  30 mg daily to the existing amlodipine , carvedilol  and losartan  -Continue aspirin       Relevant Medications   carvedilol  (COREG ) 6.25 MG tablet   isosorbide  mononitrate (IMDUR ) 30 MG 24 hr tablet   Other Relevant Orders   CBC   Basic metabolic panel with GFR     Other   CKD stage G3a/A1, GFR 45-59 and albumin  creatinine ratio <30 mg/g (HCC) (Chronic)   Chronic kidney disease stage 3 with a creatinine level of 1.55.  Continue ARB for renal protection.  Continue PRN Lasix .  Continue afterload reduction/BP control with additional medication-amlodipine  2.5 mg daily.      S/P CABG x 2 (Chronic)   Last Myoview  in 2022 was nonischemic.  Now having progressive symptoms make me concerned about the possibility of progression of either downstream  disease or graft failure.  Will plan For cardiac catheterization via left radial access.      Statin myopathy (Chronic)   Had tried multiple different statins, significant myopathy, but seems to be doing relatively well with modest dose pravastatin .   Continue current doses of Zetia , fish oil moderate dose pravastatin , and Leqvio  with excellent lipid management.            Informed Consent   Shared Decision Making/Informed Consent  The risks [stroke (1 in 1000), death (1 in 1000), kidney failure [facility]  temporary] (1 in 500), bleeding (1 in 200), allergic reaction [possibly serious] (1 in 200)], benefits (diagnostic support and management of coronary artery disease) and alternatives of a cardiac catheterization were discussed in detail with Mr. Endsley and he is willing to proceed.      Follow-Up: Return in about 3 weeks (around 02/26/2024). Follow-up plan discussed for post-catheterization care. - Schedule follow-up with PA within the first couple of weeks post-catheterization. - Plan for follow-up with cardiologist one month after catheterization.  I spent 51 minutes in the care of MILIK GILREATH today including reviewing labs (both in epic and KPN-2 minutes), reviewing studies (cardiac cath from 2018 as well as recent echo and Myoview  reviewed-7 minutes), face to face time discussing treatment options (27 minutes), reviewing records from previous hospitalizations and previous  clinic note from Dr. Alveta (6 minutes), 9 minutes dictating, and documenting in the encounter.       Signed, Alm MICAEL Clay, MD, MS Alm Clay, M.D., M.S. Interventional Cardiologist  [facility] Pager # [phone]

## 2024-02-05 NOTE — Assessment & Plan Note (Signed)
 Hyperlipidemia with LDL goal less than 55.  As of January 2025, LDL was still 40 mmHg.  Continue current medications. Hyperlipidemia managed with Leqvio , Zetia , and pravastatin .

## 2024-02-05 NOTE — Assessment & Plan Note (Signed)
 Hypertension managed with amlodipine  2.5 mg daily, carvedilol  12.5 mg twice daily, and losartan  100 mg daily.   Blood pressure is well-controlled. => There is some room to potentially titrate up amlodipine , but for now would use a quicker acting medication such as Imdur  to help treat his angina.

## 2024-02-05 NOTE — Assessment & Plan Note (Signed)
 Last Myoview  in 2022 was nonischemic.  Now having progressive symptoms make me concerned about the possibility of progression of either downstream disease or graft failure.  Will plan For cardiac catheterization via left radial access.

## 2024-02-05 NOTE — Patient Instructions (Addendum)
 Medication Instructions:   No changes  *If you need a refill on your cardiac medications before your next appointment, please call your pharmacy*   Lab Work: CBC BMP If you have labs (blood work) drawn today and your tests are completely normal, you will receive your results only by: MyChart Message (if you have MyChart) OR A paper copy in the mail If you have any lab test that is abnormal or we need to change your treatment, we will call you to review the results.   Testing/Procedures: Your physician has requested that you have a cardiac catheterization. Cardiac catheterization is used to diagnose and/or treat various heart conditions. Doctors may recommend this procedure for a number of different reasons. The most common reason is to evaluate chest pain. Chest pain can be a symptom of coronary artery disease (CAD), and cardiac catheterization can show whether plaque is narrowing or blocking your heart's arteries. This procedure is also used to evaluate the valves, as well as measure the blood flow and oxygen levels in different parts of your heart.  Please follow instruction sheet, as given.    Follow-Up: At [facility], you and your health needs are our priority.  As part of our continuing mission to provide you with exceptional heart care, we have created designated Provider Care Teams.  These Care Teams include your primary Cardiologist (physician) and Advanced Practice Providers (APPs -  Physician Assistants and Nurse Practitioners) who all work together to provide you with the care you need, when you need it.     Your next appointment:   2 to 3 week(s)  The format for your next appointment:   In Person  Provider:   Alm Clay, MD or APP   Other Instructions   [facility] A DEPT OF [facility] AT [address] A DEPT OF THE [place] [address] [place] [address] Dept: [phone] Loc: [phone]  Derrick [name]  02/05/2024  You are scheduled for a Cardiac Catheterization on Friday, September 12 with Dr. Alm [name].  1. Please arrive at the [facility] (Main Entrance A) at [facility]: [address] [place], [place] [address] at 7:00 AM (This time is 2 hour(s) before your procedure to ensure your preparation).   Free valet parking service is available. You will check in at ADMITTING. The support person will be asked to wait in the waiting room.  It is OK to have someone drop you off and come back when you are ready to be discharged.    Special note: Every effort is made to have your procedure done on time. Please understand that emergencies sometimes delay scheduled procedures.  2. Diet: Light meals; please stop eating at midnight the night before  Light meal consist of plain toast, fruit, light soups, crackers.   3. Hydration: You need to be well hydrated before your procedure. On September 12, you may drink approved liquids (see below) until 2 hours before the procedure, with 16 oz of water as your last intake.   List of approved liquids water, clear juice, clear tea, black coffee, fruit juices, non-citric and without pulp, carbonated beverages, Gatorade, Kool -Aid, plain Jello-O and plain ice popsicles.  4. Labs: You will need to have blood drawn  [facility] ,[facility]MP [facility]  [name]st [facility] [address], [place], [place] [address]. You do not need to be fasting.  5. Medication instructions in preparation for your procedure:   Contrast Allergy: No   Stop taking, Cozaar  (Losartan ) Friday, September 12,, Lasix  (Furosemide )  Friday, September 12, ( morning of procedure)   On the morning of your procedure, take your Aspirin  81 mg and any morning medicines NOT listed above.  You may use sips of water.  6. Plan to go home the same day, you will only stay overnight if medically necessary. 7. Bring a current list of your  medications and current insurance cards. 8. You MUST have a responsible person to drive you home. 9. Someone MUST be with you the first 24 hours after you arrive home or your discharge will be delayed. 10. Please wear clothes that are easy to get on and off and wear slip-on shoes.  Thank you for allowing us  to care for you!   -- Derrick [name] [facility]

## 2024-02-05 NOTE — H&P (View-Only) (Signed)
 Cardiology Office Note:  .   Date:  02/05/2024  ID:  Derrick [name], DOB 12/04/1955, MRN [id number] PCP: Derrick Prentice [name], [name]  [facility] Providers Cardiologist:  Derrick Clay, MD Cardiology APP:  Derrick Glendia DASEN, PA-C     Chief Complaint  Patient presents with   Follow-up    Establish new cardiologist.   Coronary Artery Disease    Now having exertional dyspnea with some chest tightness    Patient Profile: .     Derrick [name] is a borderline obese 68 y.o. male former smoker with a PMH notable for CAD-PCI followed by CABG with HTN HLD and nonocclusive PAD/carotid disease who presents here for delayed 26-month follow-up to discuss exertional dyspnea and chest tightness at the request of Derrick Prentice [name], [name].  CAD: LAD-LCx PCI 2006; known RCA CTO 2018-Revealed ostial LM disease with mild ISR in the LCx and LAD.  L-R collaterals to RCA CTO => CABG x 2 LIMA-LAD and Y graft from LIMA-RIMA-OM stents x2=LAD LCX; totally occluded RCA b. CABG 03/02/17 x2 (left internal mammary artery to LAD, Y-graft right mammary from left mammary to OM).//  Myoview  9/22: Inferior infarct, no ischemia, EF 61; low risk //  Echocardiogram 9/22: EF 55-60, no RWMA, GLS -24.5%, normal RVSF, RVSP 30.7, mild LAE, trivial MR, trivial AI, mild AV sclerosis w/o AS   Chronic HFpEF HLD (statin myopathy): On Zetia  10 mg daily, alpha lipoic acid and Leqvio  (June-December) HTN Nonocclusive PAD/carotid disease followed by [facility] CKD-2     Derrick [name] was last seen by Dr. Alveta on June 14, 2023 for follow-up.  Home BP meds were relatively elevated.  Starting exercise now and doing stationary bike.  No chest pain.  LDL from PCP was 56.  No active symptoms.  No changes made.  Subjective  Discussed the use of AI scribe software for clinical note transcription with the patient, who gave verbal consent to proceed.  History of Present Illness Derrick [name] presents here today for follow-up stating that he has been  experiencing chest pain for the past two months, described as a tightness in the chest occurring with minimal exertion, such as taking out the trash. The pain is reminiscent of what he felt prior to his bypass surgery in 2018, though less severe. During these episodes, he also experiences shortness of breath and tightness in his head.  His medical history includes coronary artery disease, with stent placement in 2006 and a two-vessel bypass surgery in 2018 due to left main disease. The bypass involved grafting arteries from his chest wall. One of the arteries was left closed during surgery. He has not had recent procedures for his leg arteries but undergoes regular check-ups and Doppler studies.  His current medications include amlodipine  2.5 mg, carvedilol  12.5 mg twice daily, losartan  100 mg daily, Zetia  10 mg daily, pravastatin  20 mg, and furosemide  with a higher dose on Mondays and Thursdays. He also takes aspirin  and Leqvio  injections.  No shortness of breath when lying flat, waking up short of breath, heart palpitations, or blood in stools. He reports swelling in his left leg, which has been present since his heart surgery, and neuropathy. No new swelling or changes in his condition. No syncope or near syncope.  No TIA or amaurosis fugax.  No claudication.  ROS:  Review of Systems - Negative except symptoms noted above    Objective   Pertinent CV Medications - Leqvio ; - Zetia  10 mg a day; - Fish oil; -  Pravastatin  20 mg; alpha lipoic acid - Amlodipine  2.5 mg; - Carvedilol  12.5 mg twice a day - Losartan  100 mg a day - Furosemide  40 mg twice a day - Aspirin  81 mg daily  Studies Reviewed: SABRA   EKG Interpretation Date/Time:  Monday February 05 2024 12:07:54 EDT Ventricular Rate:  50 PR Interval:  214 QRS Duration:  92 QT Interval:  456 QTC Calculation: 415 R Axis:   53  Text Interpretation: Sinus bradycardia with 1st degree A-V block When compared with ECG of 03-Mar-2017 06:30, PR  interval has increased ST no longer elevated in Anterolateral leads Confirmed by Derrick [name] ([id number]) on 02/05/2024 6:59:06 PM    Lab Results  Component Value Date   CHOL 132 11/30/2022   HDL 44 11/30/2022   LDLCALC 56 11/30/2022   TRIG 195 (H) 11/30/2022   CHOLHDL 3.0 11/30/2022  05/2023:TC 113, Tg 189, HDL 43, LDL 40; TSH 2.48  Results LABS Hb: 13.2 (09/2023) Cr: 1.55 (09/2023) A1c: 6.0 (12/20/2023) TSH: 2.49 (05/2023) ECHO 05/2022:  1. Left ventricular ejection fraction, by estimation, is 55 to 60% . Left ventricular ejection fraction by 3D volume is 58 % . The left ventricle has normal function. The left ventricle has no regional wall motion abnormalities. Left ventricular diastolic parameters were normal. 2. Right ventricular systolic function is normal. The right ventricular size is mildly enlarged. There is normal pulmonary artery systolic pressure. The estimated right ventricular systolic pressure is 34. 8 mmHg. 3. Left atrial size was mildly dilated. 4. Right atrial size was mildly dilated. 5. The mitral valve is degenerative. Mild mitral valve regurgitation. No evidence of mitral stenosis. 6. The aortic valve is tricuspid. Aortic valve regurgitation is trivial. Aortic valve sclerosis/ calcification is present, without any evidence of aortic stenosis. 7. The inferior vena cava is normal in size with greater than 50% respiratory variability, suggesting right atrial pressure of 3 mmHg. CATH 02/2017: Dominance: Right; Ost RCA 100%; Ost LM 70%, P Cx 50%, mLCX stent 10%l mLAD stent 10%  => CABG x 2 LIMA-LAD, y GRAFT RIMA - OM     Risk Assessment/Calculations:             Physical Exam:   VS:  BP (!) 132/56   Pulse (!) 51   Ht 6' 2 (1.88 m)   Wt 232 lb 12.8 oz (105.6 kg)   SpO2 98%   BMI 29.89 kg/m    Wt Readings from Last 3 Encounters:  02/05/24 232 lb 12.8 oz (105.6 kg)  11/14/23 232 lb 12.8 oz (105.6 kg)  11/06/23 233 lb 11.2 oz (106 kg)      GEN: Well nourished, well  developed in no acute distress; borderline obese NECK: No JVD; No carotid bruits CARDIAC: Normal S1, S2; RRR, no murmurs, rubs, gallops RESPIRATORY:  Clear to auscultation without rales, wheezing or rhonchi ; nonlabored, good air movement. ABDOMEN: Soft, non-tender, non-distended EXTREMITIES:  No edema; No deformity      ASSESSMENT AND PLAN: .    Problem List Items Addressed This Visit       Cardiology Problems   Coronary artery disease involving native heart with unstable angina pectoris (HCC) (Chronic)   Exertional angina for two months, similar to pre-bypass symptoms but less severe. Concerns about graft patency. Differential includes graft occlusion or native artery disease. Risks of intervention include potential complications from catheterization and stenting, especially if left main disease is present. Decision to proceed with intervention will depend on catheterization findings. - Schedule heart catheterization  for Friday, February 09, 2024, to assess graft patency and coronary anatomy. - Prescribe long-acting nitroglycerin  to be taken in the evening. => Imdur  30 mg nightly - Continue carvedilol  12.5 mg twice daily and amlodipine  2.5 midodrine daily along with losartan  100 mg daily - Continue aspirin  81 mg daily -Will plan for diagnostic cardiac catheterization with Left Heart Catheterization with Native Coronary and Graft Angiography and Possible Percutaneous Coronary Intervention - Order blood work prior to catheterization. - Advise to seek immediate care if symptoms occur at rest. - Plan for potential intervention based on catheterization findings.      Relevant Medications   carvedilol  (COREG ) 6.25 MG tablet   isosorbide  mononitrate (IMDUR ) 30 MG 24 hr tablet   Other Relevant Orders   CBC   Basic metabolic panel with GFR   Hyperlipidemia (Chronic)   Hyperlipidemia with LDL goal less than 55.  As of January 2025, LDL was still 40 mmHg.  Continue current  medications. Hyperlipidemia managed with Leqvio , Zetia , and pravastatin .       Relevant Medications   carvedilol  (COREG ) 6.25 MG tablet   isosorbide  mononitrate (IMDUR ) 30 MG 24 hr tablet   Hypertension (Chronic)   Hypertension managed with amlodipine  2.5 mg daily, carvedilol  12.5 mg twice daily, and losartan  100 mg daily.   Blood pressure is well-controlled. => There is some room to potentially titrate up amlodipine , but for now would use a quicker acting medication such as Imdur  to help treat his angina.      Relevant Medications   carvedilol  (COREG ) 6.25 MG tablet   isosorbide  mononitrate (IMDUR ) 30 MG 24 hr tablet   PAD (peripheral artery disease) (HCC) (Chronic)   Relevant Medications   carvedilol  (COREG ) 6.25 MG tablet   isosorbide  mononitrate (IMDUR ) 30 MG 24 hr tablet   Other Relevant Orders   EKG 12-Lead (Completed)   Progressive angina (HCC) - Primary   Recurrence of exertional dyspnea and chest tightness symptoms concerning for progressive angina over the last several months. At this point will defer his preop assessment and proceed with coronary ischemia evaluation with cardiac catheterization and possible PCI.  - Add Imdur  30 mg daily to the existing amlodipine , carvedilol  and losartan  -Continue aspirin       Relevant Medications   carvedilol  (COREG ) 6.25 MG tablet   isosorbide  mononitrate (IMDUR ) 30 MG 24 hr tablet   Other Relevant Orders   CBC   Basic metabolic panel with GFR     Other   CKD stage G3a/A1, GFR 45-59 and albumin  creatinine ratio <30 mg/g (HCC) (Chronic)   Chronic kidney disease stage 3 with a creatinine level of 1.55.  Continue ARB for renal protection.  Continue PRN Lasix .  Continue afterload reduction/BP control with additional medication-amlodipine  2.5 mg daily.      S/P CABG x 2 (Chronic)   Last Myoview  in 2022 was nonischemic.  Now having progressive symptoms make me concerned about the possibility of progression of either downstream  disease or graft failure.  Will plan For cardiac catheterization via left radial access.      Statin myopathy (Chronic)   Had tried multiple different statins, significant myopathy, but seems to be doing relatively well with modest dose pravastatin .   Continue current doses of Zetia , fish oil moderate dose pravastatin , and Leqvio  with excellent lipid management.            Informed Consent   Shared Decision Making/Informed Consent  The risks [stroke (1 in 1000), death (1 in 1000), kidney failure [facility]  temporary] (1 in 500), bleeding (1 in 200), allergic reaction [possibly serious] (1 in 200)], benefits (diagnostic support and management of coronary artery disease) and alternatives of a cardiac catheterization were discussed in detail with Derrick [name] and he is willing to proceed.      Follow-Up: Return in about 3 weeks (around 02/26/2024). Follow-up plan discussed for post-catheterization care. - Schedule follow-up with PA within the first couple of weeks post-catheterization. - Plan for follow-up with cardiologist one month after catheterization.  I spent 51 minutes in the care of Derrick [name] today including reviewing labs (both in epic and KPN-2 minutes), reviewing studies (cardiac cath from 2018 as well as recent echo and Myoview  reviewed-7 minutes), face to face time discussing treatment options (27 minutes), reviewing records from previous hospitalizations and previous  clinic note from Dr. Alveta (6 minutes), 9 minutes dictating, and documenting in the encounter.       Signed, Derrick MICAEL Clay, MD, MS Derrick [name], M.D., M.S. Interventional Cardiologist  [facility] Pager # [phone]

## 2024-02-05 NOTE — Assessment & Plan Note (Signed)
 Recurrence of exertional dyspnea and chest tightness symptoms concerning for progressive angina over the last several months. At this point will defer his preop assessment and proceed with coronary ischemia evaluation with cardiac catheterization and possible PCI.  - Add Imdur  30 mg daily to the existing amlodipine , carvedilol  and losartan  -Continue aspirin 

## 2024-02-05 NOTE — Assessment & Plan Note (Addendum)
 Had tried multiple different statins, significant myopathy, but seems to be doing relatively well with modest dose pravastatin .   Continue current doses of Zetia , fish oil moderate dose pravastatin , and Leqvio  with excellent lipid management.

## 2024-02-05 NOTE — Assessment & Plan Note (Addendum)
 Exertional angina for two months, similar to pre-bypass symptoms but less severe. Concerns about graft patency. Differential includes graft occlusion or native artery disease. Risks of intervention include potential complications from catheterization and stenting, especially if left main disease is present. Decision to proceed with intervention will depend on catheterization findings. - Schedule heart catheterization for Friday, February 09, 2024, to assess graft patency and coronary anatomy. - Prescribe long-acting nitroglycerin  to be taken in the evening. => Imdur  30 mg nightly - Continue carvedilol  12.5 mg twice daily and amlodipine  2.5 midodrine daily along with losartan  100 mg daily - Continue aspirin  81 mg daily -Will plan for diagnostic cardiac catheterization with Left Heart Catheterization with Native Coronary and Graft Angiography and Possible Percutaneous Coronary Intervention - Order blood work prior to catheterization. - Advise to seek immediate care if symptoms occur at rest. - Plan for potential intervention based on catheterization findings.

## 2024-02-06 ENCOUNTER — Telehealth: Payer: Self-pay | Admitting: Cardiology

## 2024-02-06 LAB — CBC
Hematocrit: 37.6 % (ref 37.5–51.0)
Hemoglobin: 12.7 g/dL — ABNORMAL LOW (ref 13.0–17.7)
MCH: 30.5 pg (ref 26.6–33.0)
MCHC: 33.8 g/dL (ref 31.5–35.7)
MCV: 90 fL (ref 79–97)
Platelets: 251 x10E3/uL (ref 150–450)
RBC: 4.16 x10E6/uL (ref 4.14–5.80)
RDW: 12.5 % (ref 11.6–15.4)
WBC: 6.6 x10E3/uL (ref 3.4–10.8)

## 2024-02-06 LAB — BASIC METABOLIC PANEL WITH GFR
BUN/Creatinine Ratio: 18 (ref 10–24)
BUN: 32 mg/dL — ABNORMAL HIGH (ref 8–27)
CO2: 23 mmol/L (ref 20–29)
Calcium: 9.2 mg/dL (ref 8.6–10.2)
Chloride: 102 mmol/L (ref 96–106)
Creatinine, Ser: 1.81 mg/dL — ABNORMAL HIGH (ref 0.76–1.27)
Glucose: 87 mg/dL (ref 70–99)
Potassium: 5 mmol/L (ref 3.5–5.2)
Sodium: 142 mmol/L (ref 134–144)
eGFR: 40 mL/min/1.73 — ABNORMAL LOW (ref >59)

## 2024-02-06 MED ORDER — ISOSORBIDE MONONITRATE ER 30 MG PO TB24: 30.0000 mg | ORAL_TABLET | Freq: Every day | ORAL | 0 refills | Status: DC

## 2024-02-06 NOTE — Telephone Encounter (Signed)
 RX sent in

## 2024-02-06 NOTE — Telephone Encounter (Signed)
 *  STAT* If patient is at the pharmacy, call can be transferred to refill team.   1. Which medications need to be refilled? (please list name of each medication and dose if known)   isosorbide  mononitrate (IMDUR ) 30 MG 24 hr tablet   2. Which pharmacy/location (including street and city if local pharmacy) is medication to be sent to?  [id number] - [place], [place] - [address]   3. Do they need a 30 day or 90 day supply? 10 day emergent supply to cover until meds come from [facility]. Patient states he needs to start taking immediately due to procedure on Friday morning.  Dr Anner wanted him to start taking now.

## 2024-02-08 ENCOUNTER — Ambulatory Visit: Payer: Self-pay | Admitting: Cardiology

## 2024-02-08 NOTE — Telephone Encounter (Signed)
 Prescription was sent on 02/06/24 to local pharmacy.

## 2024-02-09 ENCOUNTER — Other Ambulatory Visit: Payer: Self-pay

## 2024-02-09 ENCOUNTER — Encounter (HOSPITAL_COMMUNITY)
Admission: RE | Disposition: A | Discharge status: Home / Self Care | Payer: Self-pay | Source: Home / Self Care | Attending: Cardiology

## 2024-02-09 ENCOUNTER — Ambulatory Visit (HOSPITAL_COMMUNITY)
Admission: RE | Admit: 2024-02-09 | Discharge: 2024-02-09 | Disposition: A | Discharge status: Home / Self Care | Attending: Cardiology | Admitting: Cardiology

## 2024-02-09 DIAGNOSIS — T82855A Stenosis of coronary artery stent, initial encounter: Secondary | ICD-10-CM | POA: Diagnosis not present

## 2024-02-09 DIAGNOSIS — Z951 Presence of aortocoronary bypass graft: Secondary | ICD-10-CM | POA: Diagnosis not present

## 2024-02-09 DIAGNOSIS — I2510 Atherosclerotic heart disease of native coronary artery without angina pectoris: Secondary | ICD-10-CM

## 2024-02-09 DIAGNOSIS — Y831 Surgical operation with implant of artificial internal device as the cause of abnormal reaction of the patient, or of later complication, without mention of misadventure at the time of the procedure: Secondary | ICD-10-CM | POA: Insufficient documentation

## 2024-02-09 DIAGNOSIS — N1831 Chronic kidney disease, stage 3a: Secondary | ICD-10-CM | POA: Diagnosis not present

## 2024-02-09 DIAGNOSIS — I2582 Chronic total occlusion of coronary artery: Secondary | ICD-10-CM | POA: Diagnosis not present

## 2024-02-09 DIAGNOSIS — Z7982 Long term (current) use of aspirin: Secondary | ICD-10-CM | POA: Insufficient documentation

## 2024-02-09 DIAGNOSIS — I5032 Chronic diastolic (congestive) heart failure: Secondary | ICD-10-CM | POA: Insufficient documentation

## 2024-02-09 DIAGNOSIS — I130 Hypertensive heart and chronic kidney disease with heart failure and stage 1 through stage 4 chronic kidney disease, or unspecified chronic kidney disease: Secondary | ICD-10-CM | POA: Insufficient documentation

## 2024-02-09 DIAGNOSIS — E785 Hyperlipidemia, unspecified: Secondary | ICD-10-CM | POA: Diagnosis not present

## 2024-02-09 DIAGNOSIS — I25110 Atherosclerotic heart disease of native coronary artery with unstable angina pectoris: Secondary | ICD-10-CM | POA: Diagnosis not present

## 2024-02-09 DIAGNOSIS — Z79899 Other long term (current) drug therapy: Secondary | ICD-10-CM | POA: Diagnosis not present

## 2024-02-09 DIAGNOSIS — Z006 Encounter for examination for normal comparison and control in clinical research program: Secondary | ICD-10-CM

## 2024-02-09 DIAGNOSIS — I200 Unstable angina: Secondary | ICD-10-CM | POA: Diagnosis present

## 2024-02-09 DIAGNOSIS — I739 Peripheral vascular disease, unspecified: Secondary | ICD-10-CM | POA: Insufficient documentation

## 2024-02-09 HISTORY — PX: LEFT HEART CATH AND CORS/GRAFTS ANGIOGRAPHY: CATH118250

## 2024-02-09 SURGERY — LEFT HEART CATH AND CORS/GRAFTS ANGIOGRAPHY
Anesthesia: LOCAL

## 2024-02-09 MED ORDER — FENTANYL CITRATE (PF) 100 MCG/2ML IJ SOLN
INTRAMUSCULAR | Status: DC | PRN
  Administered 2024-02-09: 25 ug via INTRAVENOUS

## 2024-02-09 MED ORDER — HEPARIN SODIUM (PORCINE) 1000 UNIT/ML IJ SOLN
INTRAMUSCULAR | Status: DC | PRN
  Administered 2024-02-09: 5000 [IU] via INTRAVENOUS

## 2024-02-09 MED ORDER — RANOLAZINE ER 500 MG PO TB12: 500.0000 mg | ORAL_TABLET | Freq: Two times a day (BID) | ORAL | 2 refills | Status: DC

## 2024-02-09 MED ORDER — SODIUM CHLORIDE 0.9 % WEIGHT BASED INFUSION
3.0000 mL/kg/h | INTRAVENOUS | Status: AC
  Administered 2024-02-09: 3 mL/kg/h via INTRAVENOUS

## 2024-02-09 MED ORDER — LIDOCAINE HCL (PF) 1 % IJ SOLN
INTRAMUSCULAR | Status: DC | PRN
  Administered 2024-02-09: 2 mL via INTRADERMAL

## 2024-02-09 MED ORDER — SODIUM CHLORIDE 0.9 % WEIGHT BASED INFUSION: 1.0000 mL/kg/h | INTRAVENOUS | Status: DC

## 2024-02-09 MED ORDER — ASPIRIN 81 MG PO CHEW
81.0000 mg | CHEWABLE_TABLET | ORAL | Status: DC
Start: 2024-02-09 — End: 2024-02-09

## 2024-02-09 MED ORDER — MIDAZOLAM HCL 2 MG/2ML IJ SOLN
INTRAMUSCULAR | Status: AC
  Filled 2024-02-09: qty 2

## 2024-02-09 MED ORDER — VERAPAMIL HCL 2.5 MG/ML IV SOLN
INTRAVENOUS | Status: DC | PRN
  Administered 2024-02-09: 10 mL via INTRA_ARTERIAL

## 2024-02-09 MED ORDER — HEPARIN (PORCINE) IN NACL 1000-0.9 UT/500ML-% IV SOLN
INTRAVENOUS | Status: DC | PRN
  Administered 2024-02-09 (×2): 500 mL

## 2024-02-09 MED ORDER — LIDOCAINE HCL (PF) 1 % IJ SOLN
INTRAMUSCULAR | Status: AC
  Filled 2024-02-09: qty 30

## 2024-02-09 MED ORDER — SODIUM CHLORIDE 0.9 % IV SOLN: 250.0000 mL | INTRAVENOUS | Status: DC | PRN

## 2024-02-09 MED ORDER — IOHEXOL 350 MG/ML SOLN
INTRAVENOUS | Status: DC | PRN
  Administered 2024-02-09: 70 mL

## 2024-02-09 MED ORDER — SODIUM CHLORIDE 0.9% FLUSH: 3.0000 mL | INTRAVENOUS | Status: DC | PRN

## 2024-02-09 MED ORDER — MIDAZOLAM HCL 2 MG/2ML IJ SOLN
INTRAMUSCULAR | Status: DC | PRN
  Administered 2024-02-09: 1 mg via INTRAVENOUS

## 2024-02-09 MED ORDER — SODIUM CHLORIDE 0.9% FLUSH: 3.0000 mL | Freq: Two times a day (BID) | INTRAVENOUS | Status: DC

## 2024-02-09 MED ORDER — VERAPAMIL HCL 2.5 MG/ML IV SOLN
INTRAVENOUS | Status: AC
  Filled 2024-02-09: qty 2

## 2024-02-09 MED ORDER — HEPARIN SODIUM (PORCINE) 1000 UNIT/ML IJ SOLN
INTRAMUSCULAR | Status: AC
  Filled 2024-02-09: qty 10

## 2024-02-09 MED ORDER — FENTANYL CITRATE (PF) 100 MCG/2ML IJ SOLN
INTRAMUSCULAR | Status: AC
  Filled 2024-02-09: qty 2

## 2024-02-09 SURGICAL SUPPLY — 11 items
CATH INFINITI 5 FR IM (CATHETERS) IMPLANT
CATH INFINITI 5FR MULTPACK ANG (CATHETERS) IMPLANT
DEVICE RAD COMP TR BAND LRG (VASCULAR PRODUCTS) IMPLANT
ELECT DEFIB PAD ADLT CADENCE (PAD) IMPLANT
GLIDESHEATH SLEND SS 6F .021 (SHEATH) IMPLANT
KIT MICROPUNCTURE NIT STIFF (SHEATH) IMPLANT
PACK CARDIAC CATHETERIZATION (CUSTOM PROCEDURE TRAY) ×2 IMPLANT
SET ATX-X65L (MISCELLANEOUS) IMPLANT
SHEATH PINNACLE 5F 10CM (SHEATH) IMPLANT
SHEATH PROBE COVER 6X72 (BAG) IMPLANT
WIRE EMERALD 3MM-J .035X260CM (WIRE) IMPLANT

## 2024-02-09 NOTE — Interval H&P Note (Signed)
 History and Physical Interval Note:  02/09/2024 11:59 AM  Derrick [name]  has presented today for surgery, with the diagnosis of unstable angina.  The various methods of treatment have been discussed with the patient and family. After consideration of risks, benefits and other options for treatment, the patient has consented to  Procedure(s): LEFT HEART CATH AND CORS/GRAFTS ANGIOGRAPHY (N/A)  PERCUTANEOUS CORONARY INTERVENTION   as a surgical intervention.  The patient's history has been reviewed, patient examined, no change in status, stable for surgery.  I have reviewed the patient's chart and labs.  Questions were answered to the patient's satisfaction.    Cath Lab Visit (complete for each Cath Lab visit)  Clinical Evaluation Leading to the Procedure:   ACS: No.  Non-ACS:    Anginal Classification: CCS III  Anti-ischemic medical therapy: Minimal Therapy (1 class of medications)  Non-Invasive Test Results: No non-invasive testing performed  Prior CABG: Previous CABG    Alm Clay

## 2024-02-09 NOTE — Discharge Instructions (Signed)

## 2024-02-09 NOTE — Progress Notes (Signed)
 Discharge instructions reviewed with pt and wife donna at the bedside. Denies questions or concerns. TR band removed no s/s of complications noted. PT ambulated to the bathroom was able to void without difficulty. Pt escorted from the unit via wheel chair to personal vehicle.

## 2024-02-09 NOTE — Research (Addendum)
 Prevail Informed Consent   Subject Name: Derrick [name]  Subject met inclusion and exclusion criteria.  The informed consent form, study requirements and expectations were reviewed with the subject and questions and concerns were addressed prior to the signing of the consent form.  The subject verbalized understanding of the trial requirements.  The subject agreed to participate in the Prevail trial and signed the informed consent at 0720 on 02/09/2024.  The informed consent was obtained prior to performance of any protocol-specific procedures for the subject.  A copy of the signed informed consent was given to the subject and a copy was placed in the subject's medical record.   Derrick [name]   Screen Fail

## 2024-02-10 ENCOUNTER — Encounter (HOSPITAL_COMMUNITY): Payer: Self-pay | Admitting: Cardiology

## 2024-02-19 NOTE — Assessment & Plan Note (Signed)
 History of prior left femoral to posterior tibial bypass, right femoral to popliteal bypass and bilateral CIA stents in 2016.  He is followed by vascular surgery.  ***

## 2024-02-19 NOTE — Progress Notes (Unsigned)
 OFFICE NOTE:    Date:  02/20/2024  ID:  Derrick [name], DOB 07-19-1955, MRN [id number] PCP: Derrick Prentice [name], [name]  [facility] Providers Cardiologist:  Derrick Clay, MD Cardiology APP:  Derrick Glendia DASEN, PA-C        Coronary artery disease  Hx PCI to LAD and LCx S/p CABG in 2018 (c/b Post op AFib) TTE 02/12/21: EF 55-60, GLS -24.5, NL RVSF, RVSP 30.7, mild LAE, trivial MR, trivial AI, AV sclerosis w/o AS, RAP 3 Myoview  01/28/21: inf scar, no ischemia, EF 61; low risk  TTE 06/09/2022: EF 55-60, no RWMA, normal RVSF, normal PASP, RVSP 34.8, mild BAE, mild MR, trivial AI, AV sclerosis, RAP 3 LHC 02/09/2024: RCA proximal-distal 100 CTO; LM ostial 95, mid 95; LCx ostial 100; OM1/RI 99; LIMA-RIMA Y graft to LAD/OM patent (HFpEF) heart failure with preserved ejection fraction  Peripheral arterial disease (followed by [facility]) S/p L Fem-Post Tib bypass 2016 S/p R Fem-Pop 2016 S/p bilat CIA stents in 2016 Carotid artery Dz US  10/14/21: bilat ICA 1-39, R subcl stenosis Hyperlipidemia  Statin myopathy Hypertension  Chronic kidney disease  Sinus brady  Prostate CA       Discussed the use of AI scribe software for clinical note transcription with the patient, who gave verbal consent to proceed. History of Present Illness Derrick [name] is a 68 y.o. male who returns for post hospital follow up. He is a prior patient of Dr. Alveta. He established with Dr. Clay 02/05/24. He was set up with cardiac catheterization due to worsening anginal symptoms. Cardiac catheterization demonstrated known RCA occlusion with near occlusion of the ostial and distal left main..  There was trivial flow in the RI and OM1.  Mid LAD and proximal LCx were occluded.  The LIMA-RIMA Y graft with LIMA-LAD/D1 insertion and Y graft RIMA-OM 3 patent.  Medical therapy is recommended. He was started on Ranolazine  500 mg twice daily.   He experiences chest tightness and difficulty breathing, particularly after minimal  physical activity such as walking to the bottom of his yard or from the garage. These symptoms have persisted for two to three months. The chest tightness has returned to the level it was before starting ranolazine , which initially provided relief after the first week of use. However, upon resuming normal activities, the symptoms returned to his previous severity.  The dose of amlodipine  was reduced from 5 mg to 2.5 mg last year due to low blood pressure and lower extremity edema.  He has not had chest pain or shortness of breath at rest.  He has not had syncope.    ROS-See HPI    Studies Reviewed:  EKG Interpretation Date/Time:  Tuesday February 20 2024 08:25:58 EDT Ventricular Rate:  54 PR Interval:  212 QRS Duration:  96 QT Interval:  448 QTC Calculation: 424 R Axis:   61  Text Interpretation: Sinus bradycardia with 1st degree A-V block When compared with ECG of 05-Feb-2024 12:07, No significant change was found Confirmed by Derrick Glendia [phone]) on 02/20/2024 8:36:24 AM    11/30/22: ALT 23, TC 132, Trig 195, LDL 56, HDL 44 02/05/24: K 5, SCr 1.81, Hgb 12.7         Physical Exam:  VS:  BP 132/78   Pulse (!) 54   Ht 6' 2 (1.88 m)   Wt 228 lb (103.4 kg)   SpO2 97%   BMI 29.27 kg/m        Wt Readings from Last 3  Encounters:  02/20/24 228 lb (103.4 kg)  02/09/24 230 lb (104.3 kg)  02/05/24 232 lb 12.8 oz (105.6 kg)    Constitutional:      Appearance: Healthy appearance. Not in distress.  Neck:     Vascular: JVD normal.  Pulmonary:     Breath sounds: Normal breath sounds. No wheezing. No rales.  Cardiovascular:     Normal rate. Regular rhythm.     Murmurs: There is no murmur.     Comments: L wrist w/o hematoma Edema:    Peripheral edema present.    Ankle: trace edema of the left ankle. Abdominal:     Palpations: Abdomen is soft.       Assessment and Plan:    Assessment & Plan Coronary artery disease involving native coronary artery of native heart with unstable  angina pectoris (HCC) S/p prior PCI to LAD and LCx. S/p CABG in 2018.  Myoview  in September 2022 demonstrated no ischemia.  Echo in January 2024 demonstrated normal EF. Recent cardiac catheterization demonstrated patent LIMA-RIMA Y graft to LAD/D1 and OM3, occluded RCA w L to R collaterals, subtotally occluded LM, occluded LCx, subtotally occluded OM, RI. There were no targets for PCI. Med Rx was recommended. He was started on Ranolazine .  He has had no improvement in his angina.  He describes CCS class III angina.  EKG today demonstrates no acute changes.  We reviewed his cardiac catheterization findings. -Continue amlodipine  2.5 mg daily, aspirin  81 mg daily, carvedilol  6.25 mg twice daily, losartan  100 mg daily, nitroglycerin  as needed, pravastatin  20 mg daily, ranolazine  500 mg twice daily -Increase isosorbide  to 60 mg daily -Consider referral to cardiac rehab if symptoms continue -Consider increasing amlodipine  further if symptoms continue -Follow-up 3-4 weeks PAD (peripheral artery disease) History of prior left femoral to posterior tibial bypass, right femoral to popliteal bypass and bilateral CIA stents in 2016.  He is followed by vascular surgery.   Primary hypertension Blood pressure controlled.  Continue amlodipine  2.5 mg daily, carvedilol  6.25 mg twice daily, losartan  100 mg daily.  Increase isosorbide  to 60 mg daily as noted.  If blood pressure limits titration of antianginal therapy, consider decreasing losartan . Mixed hyperlipidemia Labs 06/30/2023: Total cholesterol 113, HDL 43, LDL 40, triglycerides 189.  12/20/2023: ALT 15.  LDL optimal.  Triglycerides mildly elevated.  He is going to see a nutritionist soon for borderline diabetes.  Hopefully this will help his triglycerides. -Continue Zetia  10 mg daily, Leqvio  every 6 months, pravastatin  20 mg daily Preoperative cardiovascular examination He notes that he will be considering back surgery soon.  Based upon the results of his recent  cardiac catheterization, he could be cleared to proceed.  However, with his continued anginal symptoms, I have recommended that we continue to titrate his antianginal therapy until his symptoms are better controlled before proceeding with surgery.         Dispo:  Return in about 4 weeks (around 03/19/2024) for Routine Follow Up, w/ Glendia Ferrier, PA-C.  Signed, Glendia Ferrier, PA-C

## 2024-02-19 NOTE — Assessment & Plan Note (Signed)
 S/p prior PCI to LAD and LCx. S/p CABG in 2018.  Myoview  in September 2022 demonstrated no ischemia.  Echo in January 2024 demonstrated normal EF. Recent cardiac catheterization demonstrated patent LIMA-RIMA Y graft to LAD/D1 and OM3, occluded RCA w L to R collaterals, subtotally occluded LM, occluded LCx, subtotally occluded OM, RI. There were no targets for PCI. Med Rx was recommended. He was started on Ranolazine . ***

## 2024-02-20 ENCOUNTER — Ambulatory Visit: Attending: Physician Assistant | Admitting: Physician Assistant

## 2024-02-20 ENCOUNTER — Encounter: Payer: Self-pay | Admitting: Physician Assistant

## 2024-02-20 VITALS — BP 132/78 | HR 54 | Ht 74.0 in | Wt 228.0 lb

## 2024-02-20 DIAGNOSIS — Z01810 Encounter for preprocedural cardiovascular examination: Secondary | ICD-10-CM | POA: Diagnosis not present

## 2024-02-20 DIAGNOSIS — E782 Mixed hyperlipidemia: Secondary | ICD-10-CM | POA: Insufficient documentation

## 2024-02-20 DIAGNOSIS — I10 Essential (primary) hypertension: Secondary | ICD-10-CM | POA: Insufficient documentation

## 2024-02-20 DIAGNOSIS — I25110 Atherosclerotic heart disease of native coronary artery with unstable angina pectoris: Secondary | ICD-10-CM | POA: Insufficient documentation

## 2024-02-20 DIAGNOSIS — I739 Peripheral vascular disease, unspecified: Secondary | ICD-10-CM | POA: Insufficient documentation

## 2024-02-20 MED ORDER — ISOSORBIDE MONONITRATE ER 60 MG PO TB24: 60.0000 mg | ORAL_TABLET | Freq: Every day | ORAL | 3 refills | Status: DC

## 2024-02-20 NOTE — Assessment & Plan Note (Signed)
 Blood pressure controlled.  Continue amlodipine  2.5 mg daily, carvedilol  6.25 mg twice daily, losartan  100 mg daily.  Increase isosorbide  to 60 mg daily as noted.  If blood pressure limits titration of antianginal therapy, consider decreasing losartan .

## 2024-02-20 NOTE — Assessment & Plan Note (Signed)
 Labs 06/30/2023: Total cholesterol 113, HDL 43, LDL 40, triglycerides 189.  12/20/2023: ALT 15.  LDL optimal.  Triglycerides mildly elevated.  He is going to see a nutritionist soon for borderline diabetes.  Hopefully this will help his triglycerides. -Continue Zetia  10 mg daily, Leqvio  every 6 months, pravastatin  20 mg daily

## 2024-02-20 NOTE — Patient Instructions (Signed)
 Medication Instructions:  Your physician has recommended you make the following change in your medication:   INCREASE Imdur  to 60 mg taking 1 daily  *If you need a refill on your cardiac medications before your next appointment, please call your pharmacy*  Lab Work: None ordered  If you have labs (blood work) drawn today and your tests are completely normal, you will receive your results only by: MyChart Message (if you have MyChart) OR A paper copy in the mail If you have any lab test that is abnormal or we need to change your treatment, we will call you to review the results.  Testing/Procedures: None ordered  Follow-Up: At [facility], you and your health needs are our priority.  As part of our continuing mission to provide you with exceptional heart care, our providers are all part of one team.  This team includes your primary Cardiologist (physician) and Advanced Practice Providers or APPs (Physician Assistants and Nurse Practitioners) who all work together to provide you with the care you need, when you need it.  Your next appointment:   3 week(s)  Provider:   Glendia Ferrier, PA-C          We recommend signing up for the patient portal called MyChart.  Sign up information is provided on this After Visit Summary.  MyChart is used to connect with patients for Virtual Visits (Telemedicine).  Patients are able to view lab/test results, encounter notes, upcoming appointments, etc.  Non-urgent messages can be sent to your provider as well.   To learn more about what you can do with MyChart, go to [email or web address].   Other Instructions

## 2024-02-28 MED ORDER — LOSARTAN POTASSIUM 100 MG PO TABS: 50.0000 mg | ORAL_TABLET | Freq: Every evening | ORAL | Status: AC

## 2024-03-17 NOTE — Progress Notes (Unsigned)
 OFFICE NOTE:    Date:  03/18/2024  ID:  Elspeth [name] Louder, DOB 1956-05-29, MRN [id number] PCP: Marvene Prentice [name], [name]  [facility] Providers Cardiologist:  Georganna Archer, MD Cardiology APP:  Lelon Glendia DASEN, PA-C        Coronary artery disease  Hx PCI to LAD and LCx S/p CABG in 2018 (c/b Post op AFib) TTE 02/12/21: EF 55-60, GLS -24.5, NL RVSF, RVSP 30.7, mild LAE, trivial MR, trivial AI, AV sclerosis w/o AS, RAP 3 Myoview  01/28/21: inf scar, no ischemia, EF 61; low risk  TTE 06/09/2022: EF 55-60, no RWMA, normal RVSF, normal PASP, RVSP 34.8, mild BAE, mild MR, trivial AI, AV sclerosis, RAP 3 LHC 02/09/2024: RCA proximal-distal 100 CTO; LM ostial 95, mid 95; LCx ostial 100; OM1/RI 99; LIMA-RIMA Y graft to LAD/OM patent (HFpEF) heart failure with preserved ejection fraction  Peripheral arterial disease (followed by [facility]) S/p L Fem-Post Tib bypass 2016 S/p R Fem-Pop 2016 S/p bilat CIA stents in 2016 Carotid artery Dz US  10/14/21: bilat ICA 1-39, R subcl stenosis Hyperlipidemia  Statin myopathy Hypertension  Chronic kidney disease  Sinus brady  Prostate CA       Discussed the use of AI scribe software for clinical note transcription with the patient, who gave verbal consent to proceed. History of Present Illness Derrick [name] is a 68 y.o. male who returns for follow up of CAD. Last seen 02/20/24. He had a cardiac catheterization 02/09/24 for worsening angina that demonstrated known RCA occlusion with near occlusion of the ostial and distal left main. There was trivial flow in the RI and OM1. Mid LAD and proximal LCx were occluded. The LIMA-RIMA Y graft with LIMA-LAD/D1 insertion and Y graft RIMA-OM 3 patent. Med Rx was recommended and he was started on Ranolazine . When I saw him 9/23, he was still having chest pain with mild activity. I increased his Imdur  to 60 mg. He called in after with low BPs and I reduced his Losartan  to 50.   He experiences ongoing angina symptoms, which  he reports have improved by about 50% since his losartan  dose was reduced due to low blood pressure. [place]. He was given nitroglycerin , which alleviated the pain, but he developed low blood pressure with this.  His blood pressure continues to fluctuate.  This has been more noticeable recently.  He has had some systolic pressures in the 90s and 80s.  He has been symptomatic with this.  He has not had syncope.  He has not had recurrent chest pain since February 28, 2024.     ROS-See HPI    Studies Reviewed:             Physical Exam:  VS:  BP (!) 99/56   Pulse (!) 57   Ht 6' 2 (1.88 m)   Wt 229 lb 6.4 oz (104.1 kg)   SpO2 97%   BMI 29.45 kg/m        Wt Readings from Last 3 Encounters:  03/18/24 229 lb 6.4 oz (104.1 kg)  02/20/24 228 lb (103.4 kg)  02/09/24 230 lb (104.3 kg)    Constitutional:      Appearance: Healthy appearance. Not in distress.  Neck:     Vascular: JVD normal.  Pulmonary:     Breath sounds: Normal breath sounds. No wheezing. No rales.  Cardiovascular:     Normal rate. Regular rhythm.  Murmurs: There is no murmur.  Edema:    Peripheral edema present.    Pretibial: bilateral edema of the pretibial area.    Ankle: bilateral trace edema of the ankle. Abdominal:     Palpations: Abdomen is soft.       Assessment and Plan:    Assessment & Plan Coronary artery disease involving native coronary artery of native heart with angina pectoris S/p prior PCI to LAD and LCx. S/p CABG in 2018.  Myoview  in September 2022 demonstrated no ischemia.  Echo in January 2024 demonstrated normal EF. Recent cardiac catheterization demonstrated patent LIMA-RIMA Y graft to LAD/D1 and OM3, occluded RCA w L to R collaterals, subtotally occluded LM, occluded LCx, subtotally occluded OM, RI. There were no targets for PCI. Med Rx was recommended. At his last visit, I increased his Imdur  to 60 mg once  daily.  Since then, he has had difficulty with blood pressure.  He has had significant lows which have contributed to symptoms of dizziness and weakness.  We reduced his losartan .  He continues to have episodes of low blood pressure.  His anginal symptoms are overall improved.  We had a long discussion regarding medication adjustments.  I considered reducing his dose of carvedilol  versus reducing his dose of losartan  further.  He would like to simplify his medications.  Therefore, I have suggested we try to stop amlodipine . -Stop amlodipine  -Continue ASA 81 mg daily, carvedilol  6.25 mg twice daily, Imdur  30 mg daily, nitroglycerin  as needed, ranolazine  500 mg twice daily -If anginal symptoms increase, resume amlodipine  and decrease dose of carvedilol  versus losartan  -If renal function will allow, we can consider increasing ranolazine  -BMET today -Follow-up echocardiogram -Follow-up 3 months PAD (peripheral artery disease) History of prior left femoral to posterior tibial bypass, right femoral to popliteal bypass and bilateral CIA stents in 2016.  He is followed by vascular surgery.   Primary hypertension As noted, blood pressure has been fluctuating.  He has had symptomatic hypotension.  Blood pressure today is 100/50 on the left and 96/50 on the right. -Stop amlodipine  as noted -BMET today -Continue carvedilol  6.25 mg twice daily, Imdur  30 mg daily, losartan  50 mg daily Mixed hyperlipidemia Labs 06/30/2023: Total cholesterol 113, HDL 43, LDL 40, triglycerides 189.  12/20/2023: ALT 15.  LDL optimal.  -Continue inclisiran every 6 months, Zetia  10 daily, pravastatin  20 daily Bilateral carotid artery stenosis Carotid US  in 2023 demonstrated bilateral ICA stenosis 1-39 and right subclavian stenosis. -Arrange follow-up carotid US  Chronic heart failure with preserved ejection fraction (HFpEF) (HCC) Volume status stable.  NYHA II.  His low blood pressure is not associated with the higher doses of  furosemide  that he takes on Mondays and Thursdays.  Therefore, I would not make a change in his regimen. -BMET today         Dispo:  Return in about 3 months (around 06/18/2024) for Routine Follow Up, w/ Glendia Ferrier, PA-C.  Signed, Glendia Ferrier, PA-C

## 2024-03-17 NOTE — Assessment & Plan Note (Signed)
 S/p prior PCI to LAD and LCx. S/p CABG in 2018.  Myoview  in September 2022 demonstrated no ischemia.  Echo in January 2024 demonstrated normal EF. Recent cardiac catheterization demonstrated patent LIMA-RIMA Y graft to LAD/D1 and OM3, occluded RCA w L to R collaterals, subtotally occluded LM, occluded LCx, subtotally occluded OM, RI. There were no targets for PCI. Med Rx was recommended. At his last visit, I increased his Imdur  to 60 mg once daily. ***

## 2024-03-17 NOTE — Assessment & Plan Note (Signed)
 Labs 06/30/2023: Total cholesterol 113, HDL 43, LDL 40, triglycerides 189.  12/20/2023: ALT 15.  LDL optimal. ***

## 2024-03-17 NOTE — Assessment & Plan Note (Signed)
 History of prior left femoral to posterior tibial bypass, right femoral to popliteal bypass and bilateral CIA stents in 2016.  He is followed by vascular surgery.  ***

## 2024-03-18 ENCOUNTER — Encounter: Payer: Self-pay | Admitting: Physician Assistant

## 2024-03-18 ENCOUNTER — Ambulatory Visit: Attending: Physician Assistant | Admitting: Physician Assistant

## 2024-03-18 VITALS — BP 99/56 | HR 57 | Ht 74.0 in | Wt 229.4 lb

## 2024-03-18 DIAGNOSIS — I739 Peripheral vascular disease, unspecified: Secondary | ICD-10-CM | POA: Insufficient documentation

## 2024-03-18 DIAGNOSIS — I25119 Atherosclerotic heart disease of native coronary artery with unspecified angina pectoris: Secondary | ICD-10-CM | POA: Insufficient documentation

## 2024-03-18 DIAGNOSIS — I25110 Atherosclerotic heart disease of native coronary artery with unstable angina pectoris: Secondary | ICD-10-CM

## 2024-03-18 DIAGNOSIS — I5032 Chronic diastolic (congestive) heart failure: Secondary | ICD-10-CM | POA: Diagnosis present

## 2024-03-18 DIAGNOSIS — I10 Essential (primary) hypertension: Secondary | ICD-10-CM | POA: Diagnosis not present

## 2024-03-18 DIAGNOSIS — I6523 Occlusion and stenosis of bilateral carotid arteries: Secondary | ICD-10-CM | POA: Insufficient documentation

## 2024-03-18 DIAGNOSIS — E782 Mixed hyperlipidemia: Secondary | ICD-10-CM | POA: Diagnosis not present

## 2024-03-18 NOTE — Patient Instructions (Signed)
 Medication Instructions:  STOP Norvasc  (Amlodipine )  TAKE your Imdur  (Isosorbide ) daily in the mornings *If you need a refill on your cardiac medications before your next appointment, please call your pharmacy*  Lab Work: TODAY-BMET If you have labs (blood work) drawn today and your tests are completely normal, you will receive your results only by: MyChart Message (if you have MyChart) OR A paper copy in the mail If you have any lab test that is abnormal or we need to change your treatment, we will call you to review the results.  Testing/Procedures: Your physician has requested that you have a carotid duplex. This test is an ultrasound of the carotid arteries in your neck. It looks at blood flow through these arteries that supply the brain with blood. Allow one hour for this exam. There are no restrictions or special instructions.  Your physician has requested that you have an echocardiogram. Echocardiography is a painless test that uses sound waves to create images of your heart. It provides your doctor with information about the size and shape of your heart and how well your heart's chambers and valves are working. This procedure takes approximately one hour. There are no restrictions for this procedure. Please do NOT wear cologne, perfume, aftershave, or lotions (deodorant is allowed). Please arrive 15 minutes prior to your appointment time.  Please note: We ask at that you not bring children with you during ultrasound (echo/ vascular) testing. Due to room size and safety concerns, children are not allowed in the ultrasound rooms during exams. Our front office staff cannot provide observation of children in our lobby area while testing is being conducted. An adult accompanying a patient to their appointment will only be allowed in the ultrasound room at the discretion of the ultrasound technician under special circumstances. We apologize for any inconvenience.  Follow-Up: At [facility], you and your health needs are our priority.  As part of our continuing mission to provide you with exceptional heart care, our providers are all part of one team.  This team includes your primary Cardiologist (physician) and Advanced Practice Providers or APPs (Physician Assistants and Nurse Practitioners) who all work together to provide you with the care you need, when you need it.  Your next appointment:   3 month(s)  Provider:   Glendia Ferrier, PA-C      Then, Georganna Archer, MD will plan to see you again in 6 month(s).    We recommend signing up for the patient portal called MyChart.  Sign up information is provided on this After Visit Summary.  MyChart is used to connect with patients for Virtual Visits (Telemedicine).  Patients are able to view lab/test results, encounter notes, upcoming appointments, etc.  Non-urgent messages can be sent to your provider as well.   To learn more about what you can do with MyChart, go to [email or web address].   Other Instructions

## 2024-03-18 NOTE — Assessment & Plan Note (Signed)
 As noted, blood pressure has been fluctuating.  He has had symptomatic hypotension.  Blood pressure today is 100/50 on the left and 96/50 on the right. -Stop amlodipine  as noted -BMET today -Continue carvedilol  6.25 mg twice daily, Imdur  30 mg daily, losartan  50 mg daily

## 2024-03-19 ENCOUNTER — Ambulatory Visit: Payer: Self-pay | Admitting: Physician Assistant

## 2024-03-19 LAB — BASIC METABOLIC PANEL WITH GFR
BUN/Creatinine Ratio: 16 (ref 10–24)
BUN: 31 mg/dL — ABNORMAL HIGH (ref 8–27)
CO2: 23 mmol/L (ref 20–29)
Calcium: 9.4 mg/dL (ref 8.6–10.2)
Chloride: 98 mmol/L (ref 96–106)
Creatinine, Ser: 1.98 mg/dL — ABNORMAL HIGH (ref 0.76–1.27)
Glucose: 93 mg/dL (ref 70–99)
Potassium: 4.9 mmol/L (ref 3.5–5.2)
Sodium: 139 mmol/L (ref 134–144)
eGFR: 36 mL/min/1.73 — ABNORMAL LOW (ref >59)

## 2024-04-11 ENCOUNTER — Other Ambulatory Visit: Payer: Self-pay | Admitting: Urology

## 2024-04-11 DIAGNOSIS — C61 Malignant neoplasm of prostate: Secondary | ICD-10-CM

## 2024-04-29 NOTE — Progress Notes (Unsigned)
 NEUROLOGY FOLLOW UP OFFICE NOTE  Derrick [name] [id number]  Subjective:  Derrick [name] is a 68 y.o. year old left-handed male with a medical history of CAD s/p PCI and CABG, carotid stenosis, PVD c/b claudication s/p right common femoral to below knee popliteal bypass and left common femoral to PT bypass (2016), HTN, HLD, CKD, prostate cancer, OA, cervical spine disease s/p 3 neck surgeries (C3-C7 fusion), lumbar spine disease s/p lumbar surgery, myofascial pain s/p trigger point injections who we last saw on 10/27/23 for pain in feet.  To briefly review: 06/01/23: Patient's symptoms have been present for at least 6 years. 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 Over the years, things have progressively gotten worse. He felt is due to all of the standing and wearing boots at work. He feels his legs are getting weak now. He has swelling of his left leg. He notices symptoms on both sides, but the left is worse than the right. He also endorses cramps. He has imbalance, but has not had any falls in the last few years. He endorses arthritis in his hands but no numbness, tingling, or burning in the hands.   He also has left hip pain that is is not sure if it is related or not. He has a history of back pain. He had lumbar spine surgery in 2022. He has also had bypass surgery in both legs for PVD.   Per [facility] note from 04/11/23 patient had an EMG that showed chronic L4-S1 radic but also PN.   Patient has seen Dr. Onita in the past (last 04/29/2020) for neuropathy. EMG of bilateral lower extremities on 04/29/20 was normal with no evidence of neuropathy or radiculopathy.   Patient takes gabapentin  300 mg at bedtime. He has been on this for 3-4 years. He is not sure if this is helping. He has done injections in the spine previously as well, which helped at first, but not for long. He also takes B12 2500 mcg daily.   He takes aspirin  81 mg daily for  vascular disease.   He was recently diagnosed with prostate cancer but has not had any chemotherapy or radiation.   The patient does not report symptoms referable to autonomic dysfunction including impaired sweating, heat or cold intolerance, excessive mucosal dryness, gastroparetic early satiety, postprandial abdominal bloating, constipation, bowel or bladder dyscontrol, or syncope/presyncope/orthostatic intolerance.   He report any constitutional symptoms like fever, night sweats, anorexia or unintentional weight loss.   EtOH use: 1 beer every 6 months.   Restrictive diet? No Family history of neuropathy/myopathy/neuropathy disease? Mother had hammertoes and had to have a toe amputated  I increased gabapentin  to 600 mg at bedtime on 06/01/23.  10/27/23: Labs showed HbA1c of 6.0 and indeterminate IFE.   He tried gabapentin  600 mg but he was not sleeping as well. He cut back to 300 mg at bedtime and this is working better for him in terms of sleeping. He is taking alpha lipoic acid. He thinks the pain may be a little better. He finds it manageable currently. He mentions he does have cramps at night occasionally.   He has no new complaints. He denies any recent falls  Most recent Assessment and Plan (10/27/23): This is Derrick [name], a 68 y.o. male with pain in bilateral lower extremities. Previous EMG showed chronic radiculopathy and large fiber sensorimotor neuropathy. Patient's symptoms are likely multifactorial with contributions from spine disease,  arthritis, PVD, and a distal symmetric polyneuropathy. His known risk factors for neuropathy include metabolic syndrome. His IFE was indeterminate at last visit, so I will repeat today.   Plan: -Blood work: SPEP, IFE -Increase: gabapentin  300 mg BID -Continue alpha lipoic acid 600 mg  -Gave OTC recommendations for cramps  Since their last visit: Repeat immunofixation showed no concern for M protein.   Patient continues to have pain in  his feet. His left leg will swell throughout the day as well. He is wearing compression stockings for this. By the end of the day, his feet are burning.   He tried gabapentin  300 mg BID but this made him too sleepy, so he went back to 300 mg at bedtime. He thinks this may help a little. He continues to take alpha lipoic acid 600 mg once daily.  He has some imbalance but denies any falls.  He has bulging discs in his back and needs surgery, but he also has heart issues and cannot have back surgery until the heart issues are resolved. Due to this he has back pain and sciatica pains as well.  MEDICATIONS:  Outpatient Encounter Medications as of 05/08/2024  Medication Sig Note   acetaminophen  (TYLENOL ) 650 MG CR tablet Take 1,300 mg by mouth in the morning and at bedtime.    albuterol (VENTOLIN HFA) 108 (90 Base) MCG/ACT inhaler Inhale 2 puffs into the lungs every 4 (four) hours as needed for wheezing or shortness of breath.    Alpha-Lipoic Acid 600 MG TABS Take 600 mg by mouth at bedtime.    aspirin  81 MG EC tablet Take 81 mg by mouth in the morning.    calcitRIOL  (ROCALTROL ) 0.5 MCG capsule Take 0.5 mcg by mouth in the morning.    calcium  carbonate (SUPER CALCIUM ) 1500 (600 Ca) MG TABS tablet Take 600 mg of elemental calcium  by mouth 2 (two) times daily with a meal. Pt takes 2 tablet bid.    carvedilol  (COREG ) 6.25 MG tablet Take 6.25 mg by mouth 2 (two) times daily with a meal.    Cyanocobalamin 2500 MCG TABS Take 2,500 mcg by mouth in the morning. 02/06/2024: B-12   ezetimibe  (ZETIA ) 10 MG tablet Take 10 mg by mouth every evening.    fluticasone  (FLONASE ) 50 MCG/ACT nasal spray Place 2 sprays into both nostrils daily as needed for allergies or rhinitis.    furosemide  (LASIX ) 20 MG tablet TAKE 1 TABLET BY MOUTH DAILY EXCEPT MONDAYS AND THURSDAYS TAKE 2 TABLETS BY MOUTH DAILY    gabapentin  (NEURONTIN ) 300 MG capsule Take 1 capsule (300 mg total) by mouth 2 (two) times daily. (Patient taking  differently: Take 300 mg by mouth at bedtime.)    inclisiran (LEQVIO ) 284 MG/1.5ML SOSY injection Inject 284 mg into the skin every 6 (six) months. June and December    isosorbide  mononitrate (IMDUR ) 30 MG 24 hr tablet Take 30 mg by mouth in the morning.    losartan  (COZAAR ) 100 MG tablet Take 0.5 tablets (50 mg total) by mouth every evening.    Magnesium  400 MG CAPS Take 400 mg by mouth every evening.    Menthol , Topical Analgesic, (BIOFREEZE EX) Apply 1 Application topically 3 (three) times daily as needed (back pain/soreness).    nitroGLYCERIN  (NITROSTAT ) 0.4 MG SL tablet Place 1 tablet (0.4 mg total) under the tongue every 5 (five) minutes as needed for chest pain.    Omega-3 Fatty Acids  (FISH OIL OMEGA-3 PO) Take 2 capsules by mouth in the morning  and at bedtime.    omeprazole (PRILOSEC) 40 MG capsule Take 40 mg by mouth daily.    potassium chloride  (KLOR-CON ) 10 MEQ tablet TAKE 1 TABLET EVERY DAY    pravastatin  (PRAVACHOL ) 20 MG tablet TAKE 1 TABLET EVERY EVENING    Propylene Glycol (SYSTANE BALANCE) 0.6 % SOLN Place 2 drops into both eyes in the morning and at bedtime.    ranolazine  (RANEXA ) 500 MG 12 hr tablet Take 1 tablet (500 mg total) by mouth 2 (two) times daily.    tamsulosin  (FLOMAX ) 0.4 MG CAPS capsule Take 0.4 mg by mouth 2 (two) times daily.    TART CHERRY PO Take 3,000 mg by mouth every evening.    traZODone  (DESYREL ) 50 MG tablet Take 100 mg by mouth at bedtime.    [DISCONTINUED] ranolazine  (RANEXA ) 500 MG 12 hr tablet Take 1 tablet (500 mg total) by mouth 2 (two) times daily.    Facility-Administered Encounter Medications as of 05/08/2024  Medication   inclisiran (LEQVIO ) injection 284 mg    PAST MEDICAL HISTORY: Past Medical History:  Diagnosis Date   CAD: LM 70%, RCA 100% CTO => CABG 03/02/17 x2 (LIMA-LAD, FreeRIM-YGraft -OM) 2006   stents x2=LAD LCX; totally occluded RCA b. CABG 03/02/17 x2 (LIMA-LAD, FreeRIM-YGraft -OM) // Myoview  9/22: Inferior infarct, no ischemia,  EF 61; low risk // Echocardiogram 9/22: EF 55-60, no RWMA, GLS -24.5%, normal RVSF, RVSP 30.7, mild LAE, trivial MR, trivial AI, mild AV sclerosis w/o AS   Chronic kidney disease    mild-from taking meloxicam for many years.   Complication of anesthesia    I was slow to wake up   Dyslipidemia    GERD (gastroesophageal reflux disease)    History of bronchitis    Hyperlipidemia    statin intolerant   Hyperparathyroidism    Hypertension    Insomnia    Metatarsalgia    Neuropathy    OA (osteoarthritis)    Peripheral arterial disease    H/o Bilateral Fem-Pop Bypass   Seasonal allergies    Urinary frequency    Wears glasses     PAST SURGICAL HISTORY: Past Surgical History:  Procedure Laterality Date   ABDOMINAL AORTOGRAM W/LOWER EXTREMITY N/A 11/01/2022   Procedure: ABDOMINAL AORTOGRAM W/LOWER EXTREMITY;  Surgeon: Serene Gaile ORN, MD;  Location: [facility];  Service: Cardiovascular;  Laterality: N/A;   ABDOMINAL EXPOSURE N/A 03/31/2021   Procedure: ABDOMINAL EXPOSURE;  Surgeon: Gretta Lonni PARAS, MD;  Location: [facility] OR;  Service: Vascular;  Laterality: N/A;   ANTERIOR LUMBAR FUSION N/A 03/31/2021   Procedure: ANTERIOR LUMBAR FUSION 1 LEVEL LUMBAR FIVE TO SACRAL ONE;  Surgeon: Burnetta Aures, MD;  Location: [facility] OR;  Service: Orthopedics;  Laterality: N/A;  3 C-BED  LEFT TAP BLOCK WITH EXPAREL    CARDIAC CATHETERIZATION  10/28/2004   circ and mid LAD chyper stents   CERVICAL FUSION  2007,2009   CERVICAL SPINE SURGERY  05/30/2002   COLONOSCOPY     CORONARY ANGIOPLASTY  05/30/2004   stents placed    CORONARY ARTERY BYPASS GRAFT N/A 03/02/2017   Procedure: CORONARY ARTERY BYPASS GRAFTING (CABG), ON PUMP, TIMES TWO, USING BILATERAL MAMMARY ARTERIES;  Surgeon: Derrick Elspeth BROCKS, MD;  Location: [facility] OR;  Service: Open Heart Surgery;  Laterality: N/A;   ELBOW ARTHROPLASTY  05/30/1992   right   ENDARTERECTOMY FEMORAL Left 03/17/2015   Procedure: ENDARTERECTOMY COMMON  FEMORAL WITH PROFUNDOPLASTY;  Surgeon: Carlin FORBES Haddock, MD;  [facility] OR;  Service: Vascular;  Laterality:  Left;   FEMORAL-POPLITEAL BYPASS GRAFT Left 03/17/2015   Procedure: BYPASS GRAFT FEMORAL-POPLITEAL ARTERY-LEFT LEG AND POSTERIOR TIBIAL SEQUENTIAL GRAFT TO BELOW KNEE POPLITEAL ARTERY;  Surgeon: Carlin FORBES Haddock, MD;  Location: [facility] OR;  Service: Vascular;  Laterality: Left;   FEMORAL-POPLITEAL BYPASS GRAFT Right 05/11/2015   Procedure:  RIGHT FEMORAL-BELOW THE KNEE POPLITEAL ARTERY BYPASS GRAFT USING NON REVERSE RIGHT GREATER SAPHENOUS VEIN;  Surgeon: Carlin FORBES Haddock, MD;  Location: [facility] OR;  Service: Vascular;  Laterality: Right;   gallbadder N/A 07/2023   head cancer  2025   INCISION AND DRAINAGE ABSCESS POSTERIOR CERVICALSPINE  05/31/2007   INTRAOPERATIVE ARTERIOGRAM Left 03/17/2015   Procedure: INTRA OPERATIVE ARTERIOGRAM X2;  Surgeon: Carlin FORBES Haddock, MD;  Location: [facility] OR;  Service: Vascular;  Laterality: Left;   INTRAOPERATIVE ARTERIOGRAM Right 05/11/2015   Procedure: INTRA OPERATIVE ARTERIOGRAM;  Surgeon: Carlin FORBES Haddock, MD;  Location: [facility] OR;  Service: Vascular;  Laterality: Right;   LEFT HEART CATH AND CORONARY ANGIOGRAPHY N/A 03/01/2017   Procedure: LEFT HEART CATH AND CORONARY ANGIOGRAPHY;  Surgeon: Verlin Lonni BIRCH, MD;  Location: [facility];  Service: Cardiovascular;  Laterality: N/A;   LEFT HEART CATH AND CORS/GRAFTS ANGIOGRAPHY N/A 02/09/2024   Procedure: LEFT HEART CATH AND CORS/GRAFTS ANGIOGRAPHY;  Surgeon: Anner Alm ORN, MD;  Location: [facility] INVASIVE CV LAB;  Service: Cardiovascular;  Laterality: N/A;   PATCH ANGIOPLASTY Left 03/17/2015   Procedure: VEIN PATCH ANGIOPLASTY COMMON FEMORAL ARTERY;  Surgeon: Carlin FORBES Haddock, MD;  Location: [facility] OR;  Service: Vascular;  Laterality: Left;   PERIPHERAL VASCULAR CATHETERIZATION N/A 01/16/2015   Procedure: Abdominal Aortogram;  Surgeon: Carlin FORBES Haddock, MD;  Location: [facility] INVASIVE CV LAB;  Service: Cardiovascular;   Laterality: N/A;   SHOULDER OPEN ROTATOR CUFF REPAIR Right 04/23/2013   Procedure: RIGHT TWO TENDON ROTATOR CUFF REPAIR  SUBACROMIAL DECOMPRESSION AND OPEN DISTAL CLAVICLE RESECTION;  Surgeon: Lamar LULLA Leonor Mickey., MD;  Location: [facility];  Service: Orthopedics;  Laterality: Right;   stents placed   05/30/2004   TEE WITHOUT CARDIOVERSION N/A 03/02/2017   Procedure: TRANSESOPHAGEAL ECHOCARDIOGRAM (TEE);  Surgeon: Derrick Elspeth BROCKS, MD;  Location: [facility] OR;  Service: Open Heart Surgery;  Laterality: N/A;   VEIN HARVEST Right 05/11/2015   Procedure: WITH NON REVERSE RIGHT GREATER SAPHENOUS VEIN HARVEST;  Surgeon: Carlin FORBES Haddock, MD;  Location: [facility] OR;  Service: Vascular;  Laterality: Right;    ALLERGIES: Allergies  Allergen Reactions   Codeine Itching and Other (See Comments)   Crestor [Rosuvastatin Calcium ]     Muscle aches   Lipitor [Atorvastatin] Other (See Comments)    Muscle aches    Meloxicam Other (See Comments)    Can not take due to kidneys   Nsaids     Can not take due to kidney hx   Statins Other (See Comments)    myalgia    FAMILY HISTORY: Family History  Problem Relation Age of Onset   Arthritis Mother    Varicose Veins Mother    Lung cancer Father    COPD Father        Lung   Cancer Brother        Lukemia   COPD Brother    Cancer Brother        Prostate    SOCIAL HISTORY: Social History   Tobacco Use   Smoking status: Former    Current packs/day: 0.00    Types: Cigarettes    Quit date: 05/30/1989  Years since quitting: 34.9    Passive exposure: Never   Smokeless tobacco: Never  Vaping Use   Vaping status: Never Used  Substance Use Topics   Alcohol use: Yes    Comment: 1 beer every 6 months   Drug use: Never   Social History   Social History Narrative   Lives with his wife.   One stepson.   Left-handed.   One cup caffeine per week.    Are you currently employed ?    What is your current occupation? retired   Do you live  at home alone?   Who lives with you? wife   What type of home do you live in: 1 story or 2 story? one          Objective:  Vital Signs:  BP 112/65   Pulse 61   Ht 6' 2 (1.88 m)   Wt 232 lb (105.2 kg)   SpO2 97%   BMI 29.79 kg/m   General: No acute distress.  Patient appears well-groomed.   Head:  Normocephalic/atraumatic Neck: supple Heart: regular rate and rhythm Lungs: Clear to auscultation bilaterally. Vascular: No carotid bruits.  Neurological Exam: Mental status: alert and oriented, speech fluent and not dysarthric, language intact.  Cranial nerves: CN I: not tested CN II: pupils equal, round and reactive to light, visual fields intact CN III, IV, VI:  full range of motion, no nystagmus, no ptosis CN V: facial sensation intact. CN VII: upper and lower face symmetric CN VIII: hearing intact CN IX, X: uvula midline CN XI: sternocleidomastoid and trapezius muscles intact CN XII: tongue midline  Bulk & Tone: normal. Motor:  muscle strength 5/5 throughout Deep Tendon Reflexes:  2+ throughout, except absent in bilateral ankles.   Sensation:  Pinprick sensation diminished to mid-calf bilaterally (L > R). Vibration absent at great toes and ankles, otherwise intact. Finger to nose testing:  Without dysmetria.   Gait:  Wide based, mildly unsteady gait.  Romberg with mild sway.   Labs and Imaging review: New results: BMP (03/18/24) significant for BUN 31, Cr 1.98 CBC (02/05/24) unremarkable  10/27/23: SPEP: poorly defined band of restricted mobility IFE: No M protein  Previously reviewed results: 06/01/23: HbA1c: 6.0 Folate wnl MMA wnl IFE: poorly-defined area of restricted protein mobility is  detected and is reactive with IgG and kappa antisera.    Lipid panel (11/30/22): tChol 132, LDL 56, TG 195 CMET (11/30/22): significant for Cr 1.39 TSH (05/13/22): wnl   04/29/20: HbA1c: 5.6 CK: 126 ANA neg B12: 1455   Imaging: CT head and maxillofacial wo contrast  (09/28/20): IMPRESSION: CT head:   No evidence of acute intracranial abnormality.   CT maxillofacial:   1. No evidence of acute maxillofacial fracture. 2. Right periorbital and maxillofacial hematoma with laceration.   MRI cervical spine wo contrast (04/21/20): IMPRESSION:  MRI cervical spine (without) demonstrating: - At C7-T1: rightward disc protrusion with moderate right foraminal stenosis. - At C2-3: disc bulging and uncovertebral joint hypertrophy with mild left foraminal stenosis. - Multi-level cervical discectomy fusion C3-C7 levels with metallic hardware.    MRI lumbar spine (12/29/2014): IMPRESSION: Degenerative disc disease and degenerative facet disease throughout the lower lumbar region that could certainly be associated with low back pain.   Bulging disc sent facet hypertrophy with lateral recess narrowing that could possibly be symptomatic at L3-4 and L4-5. Mild foraminal narrowing bilaterally at L4-5 and L5-S1 that could possibly be associated with neural irritation. Definite neural compression is not demonstrated  however.   EMG (04/29/20 at [facility] by Dr. Onita): Summary of the test: Nerve conduction study: Bilateral sural, superficial peroneal sensory responses were normal.   Bilateral peroneal to EDB and tibial motor responses were normal.   Electromyography: Selected needle examination of bilateral lower extremity muscles and bilateral lumbosacral paraspinal muscles were normal.   Conclusion: This is a normal study.  There is no electrodiagnostic evidence of large fiber peripheral neuropathy or bilateral lumbosacral radiculopathy.   External EMG at [facility] (04/03/23):      Assessment/Plan:  This is SAI ZINN, a 68 y.o. male with pain in bilateral lower extremities. Previous EMG showed chronic radiculopathy and large fiber sensorimotor neuropathy. Patient's symptoms are likely multifactorial with contributions from spine disease, arthritis, PVD, and  a distal symmetric polyneuropathy. His known risk factors for neuropathy include metabolic syndrome. He has had incomplete relief with gabapentin , but unable to tolerate higher doses (sleepiness). I will add Cymbalta today to attempt to provide more symptom control.  Plan: -Continue gabapentin  300 mg at bedtime -Start Cymbalta 30 mg at bedtime for 1 week, then increase to 60 mg at bedtime thereafter -Continue alpha lipoic acid 600 mg once or twice daily. -Fall precautions discussed  Return to clinic in 6 months  Total time spent reviewing records, interview, history/exam, documentation, and coordination of care on day of encounter:  35 min  Venetia Potters, MD

## 2024-04-30 ENCOUNTER — Telehealth (HOSPITAL_BASED_OUTPATIENT_CLINIC_OR_DEPARTMENT_OTHER): Payer: Self-pay | Admitting: *Deleted

## 2024-04-30 NOTE — Telephone Encounter (Signed)
 He has a lot of disease with ongoing angina. His last cardiac catheterization showed no targets for PCI. He is going to be at elevated risk regardless. If his surgery needs to be scheduled before he sees me again in Jan (and echocardiogram in mid December) would rather he be called to review symptoms since last visit. If surgery can be put off until he sees me again, I can take care of clearance at that visit. Glendia Ferrier, PA-C    04/30/2024 3:58 PM

## 2024-04-30 NOTE — Telephone Encounter (Signed)
 Called surgeons office and left message for Katheryn Dustman to call our office and let us  know if the patients procedure can be after his appointment in our office on 06/28/24 with Glendia Ferrier, PA-C. Jeryl Stone's voice will stats that she will not be back in office until Thursday 05/02/24)

## 2024-04-30 NOTE — Telephone Encounter (Signed)
   Pre-operative Risk Assessment    Patient Name: Derrick [name]  DOB: 1956-02-20 MRN: [id number]   Date of last office visit: 03/18/24 GLENDIA FERRIER, [facility] Date of next office visit: 06/28/24 GLENDIA FERRIER, [facility]   Request for Surgical Clearance    Procedure:  L3-4 DECOMPRESSION  Date of Surgery:  Clearance TBD                                Surgeon:  DR. [facility] BROOKS Surgeon's Group or Practice Name:  [facility] Phone number:  [phone] ATTN: KATHERYN DUSTMAN Fax number:  [phone]   Type of Clearance Requested:   - Medical  - Pharmacy:  Hold Aspirin      Type of Anesthesia:  General    Additional requests/questions:    Bonney Niels Jest   04/30/2024, 1:11 PM

## 2024-05-06 ENCOUNTER — Telehealth: Payer: Self-pay | Admitting: Student in an Organized Health Care Education/Training Program

## 2024-05-06 MED ORDER — RANOLAZINE ER 500 MG PO TB12: 500.0000 mg | ORAL_TABLET | Freq: Two times a day (BID) | ORAL | 1 refills | Status: AC

## 2024-05-06 NOTE — Telephone Encounter (Signed)
 Refills has been sent to the pharmacy.

## 2024-05-06 NOTE — Telephone Encounter (Signed)
 Per notes I have read the pt has echo scheduled 05/13/24. Does not look like we have heard back from surgery scheduler Katheryn Dustman. I will fax theses notes as well to the surgeon to see notes from Glendia Ferrier, [facility] and preop APP Damien Braver, NP.

## 2024-05-06 NOTE — Telephone Encounter (Signed)
*  STAT* If patient is at the pharmacy, call can be transferred to refill team.   1. Which medications need to be refilled? (please list name of each medication and dose if known)   ranolazine  (RANEXA ) 500 MG 12 hr tablet   2. Would you like to learn more about the convenience, safety, & potential cost savings by using the [facility]?   3. Are you open to using the [facility] (Type [facility]. ).  4. Which pharmacy/location (including street and city if local pharmacy) is medication to be sent to?  [id number] - [place], [place] - [address]   5. Do they need a 30 day or 90 day supply?   90 day  Patient stated he only has 2 days left of this medication.  Patient has appointment scheduled with CANDIE Ferrier on 1/30.

## 2024-05-07 NOTE — Telephone Encounter (Signed)
 I will update all parties involved per surgery scheduler Katheryn RAMAN ok to wait for preop clearance until the pt has had cardiac testing and f/u with Glendia Ferrier, [facility] 06/28/24

## 2024-05-07 NOTE — Telephone Encounter (Signed)
 Have sent a very detailed secure message to to surgery scheduler Katheryn S to please see notes that have been sent x 2 to her to d/w surgeon.  See previous notes.

## 2024-05-07 NOTE — Telephone Encounter (Signed)
 Katheryn calling in stating it's okay for the pt to get his surgery after his appt on 1/30.

## 2024-05-08 ENCOUNTER — Ambulatory Visit: Admitting: Neurology

## 2024-05-08 ENCOUNTER — Encounter: Payer: Self-pay | Admitting: Neurology

## 2024-05-08 VITALS — BP 112/65 | HR 61 | Ht 74.0 in | Wt 232.0 lb

## 2024-05-08 DIAGNOSIS — M1990 Unspecified osteoarthritis, unspecified site: Secondary | ICD-10-CM | POA: Diagnosis not present

## 2024-05-08 DIAGNOSIS — Z981 Arthrodesis status: Secondary | ICD-10-CM

## 2024-05-08 DIAGNOSIS — G629 Polyneuropathy, unspecified: Secondary | ICD-10-CM

## 2024-05-08 MED ORDER — DULOXETINE HCL 30 MG PO CPEP: 60.0000 mg | ORAL_CAPSULE | Freq: Every day | ORAL | 11 refills | Status: AC

## 2024-05-08 NOTE — Telephone Encounter (Signed)
 Surgery will be delayed until patient is seen by Glendia Ferrier PA-C in clinic on 06/28/24. Updated appointment notes to reflect that patient needs preop recommendations at that time.   Removing from pre op pool   Ajeet Casasola, PA-C 05/08/2024 10:16 AM

## 2024-05-08 NOTE — Patient Instructions (Signed)
 Continue gabapentin  300 mg at bedtime  Start Cymbalta 30 mg at bedtime for 1 week, then increase to 60 mg at bedtime thereafter  I will see you again in 6 months. Please let me know if you have any questions or concerns in the meantime.   The physicians and staff at [facility] are committed to providing excellent care. You may receive a survey requesting feedback about your experience at our office. We strive to receive very good responses to the survey questions. If you feel that your experience would prevent you from giving the office a very good  response, please contact our office to try to remedy the situation. We may be reached at [phone]. Thank you for taking the time out of your busy day to complete the survey.  Derrick Potters, MD [facility]   Preventing Falls at [facility] are common, often dreaded events in the lives of older people. Aside from the obvious injuries and even death that may result, fall can cause wide-ranging consequences including loss of independence, mental decline, decreased activity and mobility. Younger people are also at risk of falling, especially those with chronic illnesses and fatigue.  Ways to reduce risk for falling Examine diet and medications. Warm foods and alcohol dilate blood vessels, which can lead to dizziness when standing. Sleep aids, antidepressants and pain medications can also increase the likelihood of a fall.  Get a vision exam. Poor vision, cataracts and glaucoma increase the chances of falling.  Check foot gear. Shoes should fit snugly and have a sturdy, nonskid sole and a broad, low heel  Participate in a physician-approved exercise program to build and maintain muscle strength and improve balance and coordination. Programs that use ankle weights or stretch bands are excellent for muscle-strengthening. Water aerobics programs and low-impact Tai Chi programs have also been shown to improve balance and  coordination.  Increase vitamin D intake. Vitamin D improves muscle strength and increases the amount of calcium  the body is able to absorb and deposit in bones.  How to prevent falls from common hazards Floors - Remove all loose wires, cords, and throw rugs. Minimize clutter. Make sure rugs are anchored and smooth. Keep furniture in its usual place.  Chairs -- Use chairs with straight backs, armrests and firm seats. Add firm cushions to existing pieces to add height.  Bathroom - Install grab bars and non-skid tape in the tub or shower. Use a bathtub transfer bench or a shower chair with a back support Use an elevated toilet seat and/or safety rails to assist standing from a low surface. Do not use towel racks or bathroom tissue holders to help you stand.  Lighting - Make sure halls, stairways, and entrances are well-lit. Install a night light in your bathroom or hallway. Make sure there is a light switch at the top and bottom of the staircase. Turn lights on if you get up in the middle of the night. Make sure lamps or light switches are within reach of the bed if you have to get up during the night.  Kitchen - Install non-skid rubber mats near the sink and stove. Clean spills immediately. Store frequently used utensils, pots, pans between waist and eye level. This helps prevent reaching and bending. Sit when getting things out of lower cupboards.  Living room/ Bedrooms - Place furniture with wide spaces in between, giving enough room to move around. Establish a route through the living room that gives you something to hold onto as you  walk.  Stairs - Make sure treads, rails, and rugs are secure. Install a rail on both sides of the stairs. If stairs are a threat, it might be helpful to arrange most of your activities on the lower level to reduce the number of times you must climb the stairs.  Entrances and doorways - Install metal handles on the walls adjacent to the doorknobs of all doors to make  it more secure as you travel through the doorway.  Tips for maintaining balance Keep at least one hand free at all times. Try using a backpack or fanny pack to hold things rather than carrying them in your hands. Never carry objects in both hands when walking as this interferes with keeping your balance.  Attempt to swing both arms from front to back while walking. This might require a conscious effort if Parkinson's disease has diminished your movement. It will, however, help you to maintain balance and posture, and reduce fatigue.  Consciously lift your feet off of the ground when walking. Shuffling and dragging of the feet is a common culprit in losing your balance.  When trying to navigate turns, use a U technique of facing forward and making a wide turn, rather than pivoting sharply.  Try to stand with your feet shoulder-length apart. When your feet are close together for any length of time, you increase your risk of losing your balance and falling.  Do one thing at a time. Don't try to walk and accomplish another task, such as reading or looking around. The decrease in your automatic reflexes complicates motor function, so the less distraction, the better.  Do not wear rubber or gripping soled shoes, they might catch on the floor and cause tripping.  Move slowly when changing positions. Use deliberate, concentrated movements and, if needed, use a grab bar or walking aid. Count 15 seconds between each movement. For example, when rising from a seated position, wait 15 seconds after standing to begin walking.  If balance is a continuous problem, you might want to consider a walking aid such as a cane, walking stick, or walker. Once you've mastered walking with help, you might be ready to try it on your own again.

## 2024-05-13 ENCOUNTER — Ambulatory Visit (HOSPITAL_COMMUNITY)
Admission: RE | Admit: 2024-05-13 | Discharge: 2024-05-13 | Disposition: A | Source: Ambulatory Visit | Attending: Physician Assistant | Admitting: Physician Assistant

## 2024-05-13 ENCOUNTER — Ambulatory Visit (HOSPITAL_COMMUNITY)
Admission: RE | Admit: 2024-05-13 | Discharge: 2024-05-13 | Attending: Physician Assistant | Admitting: Physician Assistant

## 2024-05-13 DIAGNOSIS — I6523 Occlusion and stenosis of bilateral carotid arteries: Secondary | ICD-10-CM | POA: Diagnosis present

## 2024-05-13 DIAGNOSIS — I2510 Atherosclerotic heart disease of native coronary artery without angina pectoris: Secondary | ICD-10-CM

## 2024-05-13 DIAGNOSIS — I25119 Atherosclerotic heart disease of native coronary artery with unspecified angina pectoris: Secondary | ICD-10-CM | POA: Insufficient documentation

## 2024-05-13 DIAGNOSIS — I739 Peripheral vascular disease, unspecified: Secondary | ICD-10-CM | POA: Diagnosis present

## 2024-05-13 DIAGNOSIS — E782 Mixed hyperlipidemia: Secondary | ICD-10-CM | POA: Diagnosis not present

## 2024-05-13 DIAGNOSIS — I10 Essential (primary) hypertension: Secondary | ICD-10-CM

## 2024-05-13 DIAGNOSIS — I509 Heart failure, unspecified: Secondary | ICD-10-CM | POA: Diagnosis not present

## 2024-05-13 LAB — ECHOCARDIOGRAM COMPLETE
Area-P 1/2: 4.6 cm2
MV M vel: 4.84 m/s
MV Peak grad: 93.7 mmHg
Radius: 0.6 cm
S' Lateral: 5.4 cm

## 2024-05-14 ENCOUNTER — Telehealth: Payer: Self-pay | Admitting: Neurology

## 2024-05-14 ENCOUNTER — Telehealth: Payer: Self-pay

## 2024-05-14 DIAGNOSIS — I6529 Occlusion and stenosis of unspecified carotid artery: Secondary | ICD-10-CM | POA: Insufficient documentation

## 2024-05-14 DIAGNOSIS — I5020 Unspecified systolic (congestive) heart failure: Secondary | ICD-10-CM | POA: Insufficient documentation

## 2024-05-14 MED ORDER — DAPAGLIFLOZIN PROPANEDIOL 10 MG PO TABS: 10.0000 mg | ORAL_TABLET | Freq: Every day | ORAL | 11 refills | Status: AC

## 2024-05-14 NOTE — Telephone Encounter (Signed)
 Called pt back and left a message to call ED for the tightness and SOB. Told to call office

## 2024-05-14 NOTE — Addendum Note (Signed)
 Addended byBETHA FERRIER, GLENDIA T on: 05/14/2024 04:26 PM   Modules accepted: Orders

## 2024-05-14 NOTE — Telephone Encounter (Signed)
 [facility] call ID: [id number]  [place]; Self  PH: [phone]  Reason for call: Caller states he was prescribed Duloxetine  30 mg. He has been having SOB and tightness across the chest. He is wanting to speak with physician regarding his symptoms.

## 2024-05-14 NOTE — Telephone Encounter (Signed)
 Called again and pt is at the ED. Getting eval.

## 2024-05-15 ENCOUNTER — Telehealth: Payer: Self-pay | Admitting: Pharmacy Technician

## 2024-05-15 ENCOUNTER — Ambulatory Visit

## 2024-05-15 MED ORDER — INCLISIRAN SODIUM 284 MG/1.5ML ~~LOC~~ SOSY: 284.0000 mg | PREFILLED_SYRINGE | Freq: Once | SUBCUTANEOUS | Status: AC

## 2024-05-15 NOTE — Telephone Encounter (Signed)
 Auth Submission: NO AUTH NEEDED Site of care: Site of care: [facility] Payer: MEDICARE A/B & [employer] SUPP Medication & CPT/J Code(s) submitted: Leqvio  (Inclisiran) J1306 Diagnosis Code: E78.5 Route of submission (phone, fax, portal):  Phone # Fax # Auth type: Buy/Bill PB Units/visits requested: 284MG  Q3MONTHS X2, THEN Q6 MONTHS Reference number:  Approval from: 05/15/24 to 06/29/24.  Will need re-verify insurance 05/30/24

## 2024-05-27 ENCOUNTER — Ambulatory Visit

## 2024-06-04 ENCOUNTER — Telehealth: Payer: Self-pay

## 2024-06-04 DIAGNOSIS — R1310 Dysphagia, unspecified: Secondary | ICD-10-CM

## 2024-06-04 NOTE — Telephone Encounter (Signed)
 Auth Submission: NO AUTH NEEDED Site of care: Site of care: [facility] Payer: Medicare A/B with [employer] supplement Medication & CPT/J Code(s) submitted: Leqvio  (Inclisiran) J1306 Diagnosis Code:  Route of submission (phone, fax, portal):  Phone # Fax # Auth type: Buy/Bill PB Units/visits requested: 284mg  x 2 doses Reference number:  Approval from: 06/04/24 to 06/29/25

## 2024-06-11 ENCOUNTER — Ambulatory Visit
Admission: RE | Admit: 2024-06-11 | Discharge: 2024-06-11 | Disposition: A | Source: Ambulatory Visit | Attending: Urology

## 2024-06-11 DIAGNOSIS — C61 Malignant neoplasm of prostate: Secondary | ICD-10-CM

## 2024-06-11 MED ORDER — GADOPICLENOL 0.5 MMOL/ML IV SOLN
10.0000 mL | Freq: Once | INTRAVENOUS | Status: AC | PRN
  Administered 2024-06-11: 10 mL via INTRAVENOUS

## 2024-06-14 ENCOUNTER — Ambulatory Visit

## 2024-06-14 VITALS — BP 132/70 | HR 76 | Temp 97.0°F | Resp 16 | Ht 74.0 in | Wt 223.6 lb

## 2024-06-14 DIAGNOSIS — E782 Mixed hyperlipidemia: Secondary | ICD-10-CM | POA: Diagnosis not present

## 2024-06-14 MED ORDER — INCLISIRAN SODIUM 284 MG/1.5ML ~~LOC~~ SOSY
284.0000 mg | PREFILLED_SYRINGE | Freq: Once | SUBCUTANEOUS | Status: AC
  Administered 2024-06-14: 284 mg via SUBCUTANEOUS
  Filled 2024-06-14: qty 1.5

## 2024-06-14 NOTE — Progress Notes (Signed)
 Diagnosis: , Hyperlipidemia  Provider:  Mannam, Praveen MD  Procedure: Injection  Leqvio  (inclisiran), Dose: 284 mg, Site: subcutaneous, Number of injections: 1  Injection Site(s): Left arm  Post Care: Injection  Discharge: Condition: Good, Destination: Home . AVS Provided  Performed by:  Donny Childes, RN

## 2024-06-25 ENCOUNTER — Other Ambulatory Visit: Payer: Self-pay | Admitting: Physician Assistant

## 2024-06-25 MED ORDER — NITROGLYCERIN 0.4 MG SL SUBL: 0.4000 mg | SUBLINGUAL_TABLET | SUBLINGUAL | 3 refills | Status: AC | PRN

## 2024-06-28 ENCOUNTER — Ambulatory Visit: Admitting: Physician Assistant

## 2024-07-04 ENCOUNTER — Inpatient Hospital Stay: Admission: RE | Admit: 2024-07-04 | Discharge: 2024-07-04 | Disposition: A | Source: Ambulatory Visit

## 2024-07-04 DIAGNOSIS — R1310 Dysphagia, unspecified: Secondary | ICD-10-CM

## 2024-11-15 ENCOUNTER — Ambulatory Visit: Admitting: Neurology

## 2024-12-12 ENCOUNTER — Ambulatory Visit
